# Patient Record
Sex: Female | Born: 1940 | Race: White | Hispanic: No | Marital: Married | Smoking: Never smoker
Health system: Southern US, Community
[De-identification: ages and names within clinical notes are randomized; demographics above are authoritative.]

## PROBLEM LIST (undated history)

## (undated) DIAGNOSIS — Z923 Personal history of irradiation: Secondary | ICD-10-CM

## (undated) DIAGNOSIS — J700 Acute pulmonary manifestations due to radiation: Secondary | ICD-10-CM

## (undated) DIAGNOSIS — R05 Cough: Secondary | ICD-10-CM

## (undated) DIAGNOSIS — J189 Pneumonia, unspecified organism: Secondary | ICD-10-CM

## (undated) DIAGNOSIS — R112 Nausea with vomiting, unspecified: Secondary | ICD-10-CM

## (undated) DIAGNOSIS — E785 Hyperlipidemia, unspecified: Secondary | ICD-10-CM

## (undated) DIAGNOSIS — Z972 Presence of dental prosthetic device (complete) (partial): Secondary | ICD-10-CM

## (undated) DIAGNOSIS — K219 Gastro-esophageal reflux disease without esophagitis: Secondary | ICD-10-CM

## (undated) DIAGNOSIS — E119 Type 2 diabetes mellitus without complications: Secondary | ICD-10-CM

## (undated) DIAGNOSIS — Z9889 Other specified postprocedural states: Secondary | ICD-10-CM

## (undated) DIAGNOSIS — C50919 Malignant neoplasm of unspecified site of unspecified female breast: Secondary | ICD-10-CM

## (undated) DIAGNOSIS — I10 Essential (primary) hypertension: Secondary | ICD-10-CM

## (undated) DIAGNOSIS — M069 Rheumatoid arthritis, unspecified: Secondary | ICD-10-CM

## (undated) DIAGNOSIS — R059 Cough, unspecified: Secondary | ICD-10-CM

## (undated) HISTORY — DX: Essential (primary) hypertension: I10

## (undated) HISTORY — PX: OTHER SURGICAL HISTORY: SHX169

## (undated) HISTORY — DX: Malignant neoplasm of unspecified site of unspecified female breast: C50.919

## (undated) HISTORY — DX: Rheumatoid arthritis, unspecified: M06.9

## (undated) HISTORY — PX: ROTATOR CUFF REPAIR: SHX139

## (undated) HISTORY — DX: Hyperlipidemia, unspecified: E78.5

## (undated) HISTORY — PX: REPLACEMENT TOTAL KNEE BILATERAL: SUR1225

## (undated) HISTORY — PX: JOINT REPLACEMENT: SHX530

## (undated) HISTORY — PX: BREAST LUMPECTOMY: SHX2

## (undated) HISTORY — DX: Type 2 diabetes mellitus without complications: E11.9

## (undated) HISTORY — PX: APPENDECTOMY: SHX54

## (undated) HISTORY — PX: CARPAL TUNNEL RELEASE: SHX101

---

## 1989-03-19 HISTORY — PX: ABDOMINAL HYSTERECTOMY: SHX81

## 2004-08-10 ENCOUNTER — Ambulatory Visit: Payer: Self-pay | Admitting: Internal Medicine

## 2004-09-28 ENCOUNTER — Ambulatory Visit: Payer: Self-pay | Admitting: Internal Medicine

## 2004-10-04 ENCOUNTER — Ambulatory Visit: Payer: Self-pay | Admitting: Internal Medicine

## 2005-05-23 ENCOUNTER — Ambulatory Visit: Payer: Self-pay | Admitting: Specialist

## 2005-06-11 ENCOUNTER — Ambulatory Visit (HOSPITAL_COMMUNITY): Admission: AD | Admit: 2005-06-11 | Discharge: 2005-06-12 | Payer: Self-pay | Admitting: Neurosurgery

## 2005-07-19 ENCOUNTER — Encounter: Admission: RE | Admit: 2005-07-19 | Discharge: 2005-07-19 | Payer: Self-pay | Admitting: Neurosurgery

## 2005-09-16 ENCOUNTER — Emergency Department: Payer: Self-pay | Admitting: General Practice

## 2005-09-27 ENCOUNTER — Ambulatory Visit: Payer: Self-pay | Admitting: Neurosurgery

## 2005-11-21 ENCOUNTER — Other Ambulatory Visit: Payer: Self-pay

## 2005-11-28 ENCOUNTER — Ambulatory Visit: Payer: Self-pay | Admitting: Specialist

## 2006-03-21 ENCOUNTER — Inpatient Hospital Stay: Payer: Self-pay | Admitting: Specialist

## 2007-02-18 ENCOUNTER — Ambulatory Visit: Payer: Self-pay | Admitting: Internal Medicine

## 2008-02-07 IMAGING — MR MRI CERVICAL SPINE WITHOUT CONTRAST
4 of 5 series · 25 of 48 positions shown · non-contrast
Comparison: none

REASON FOR EXAM: Neck pain
COMMENTS:

PROCEDURE:     MR  - MR CERVICAL SPINE WO CONT  - May 23, 2005  [DATE]
RESULT:
HISTORY: Neck pain.

[Series 4: T1 · sagittal · 3.0mm · 0.51mm/px · 3 of 16 slices shown]
[im 3/16]
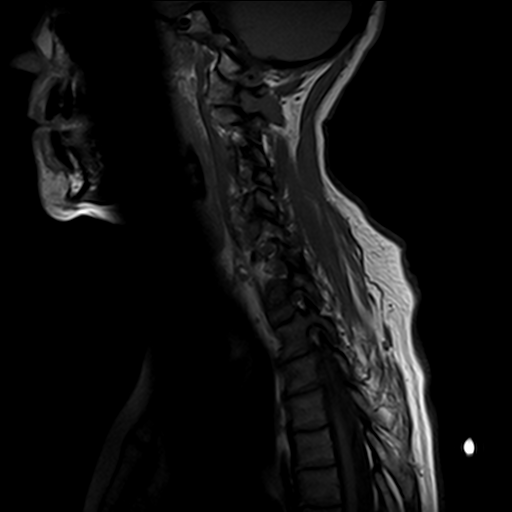
[im 9/16]
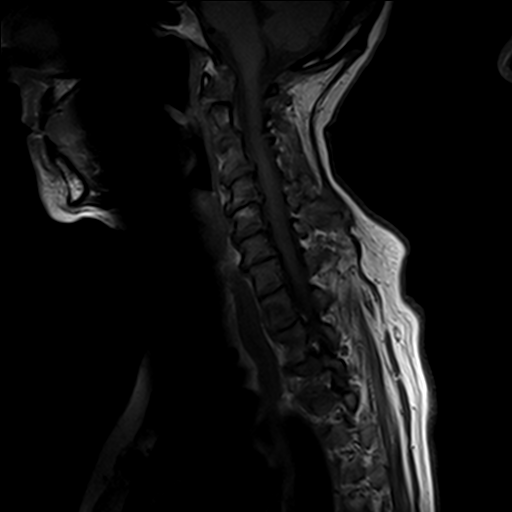
[im 13/16]
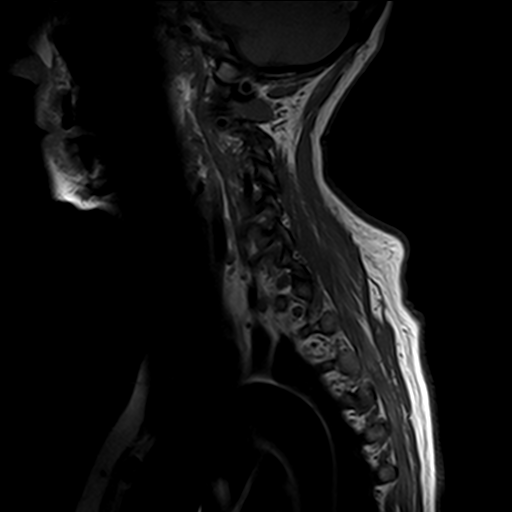

[Series 5002: T2 · axial · 3.0mm · 0.35mm/px · z∈[-77,+106]mm · 9 of 26 slices shown (1 of 3)]
[im 1/26]
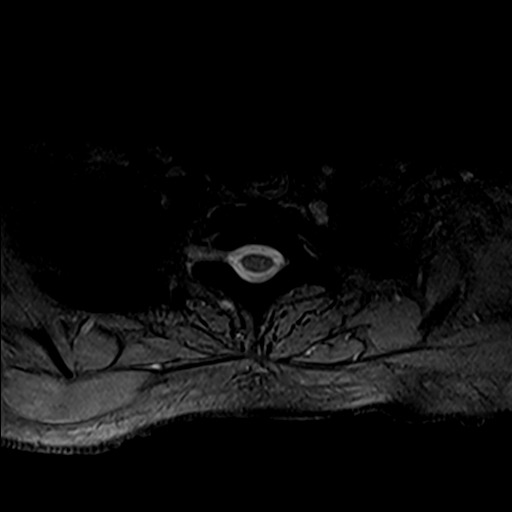
[im 5/26]
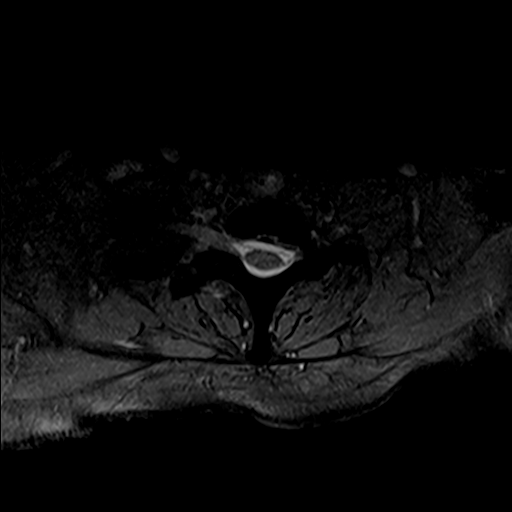
[im 7/26]
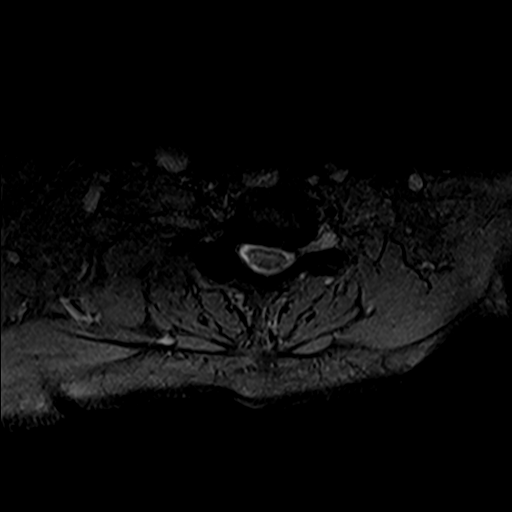
[im 12/26]
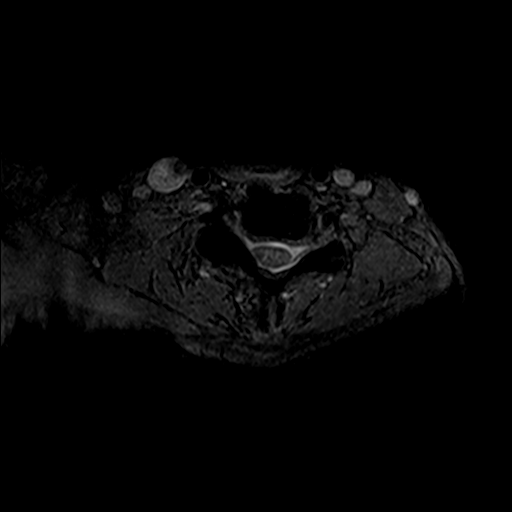
[im 14/26]
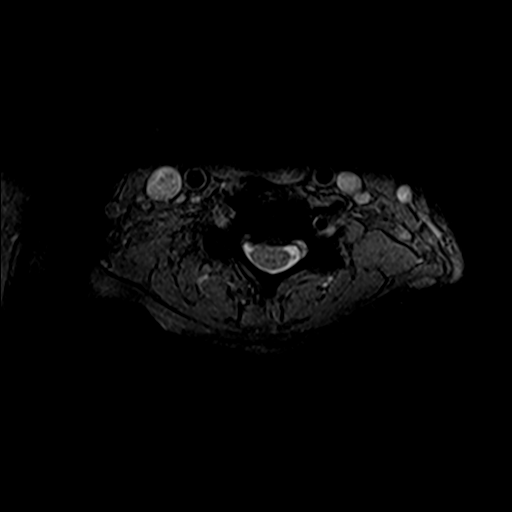
[im 19/26]
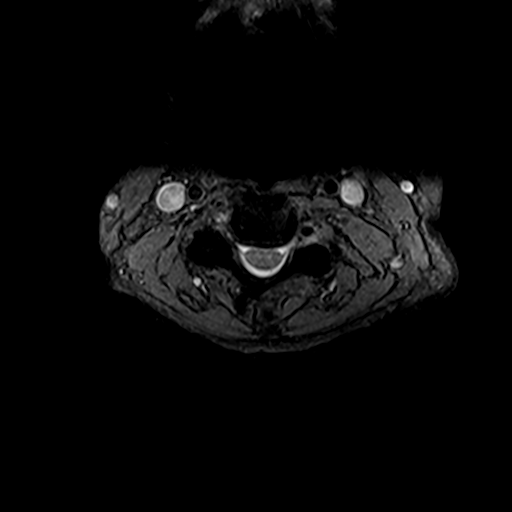
[im 21/26]
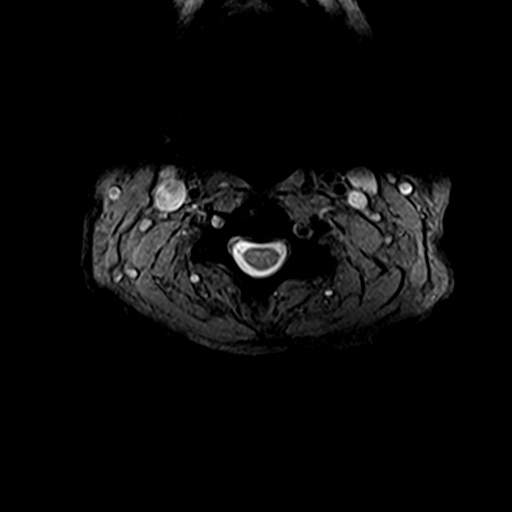
[im 23/26]
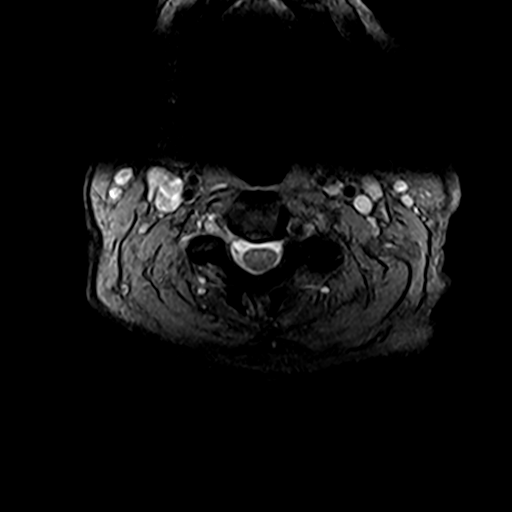
[im 26/26]
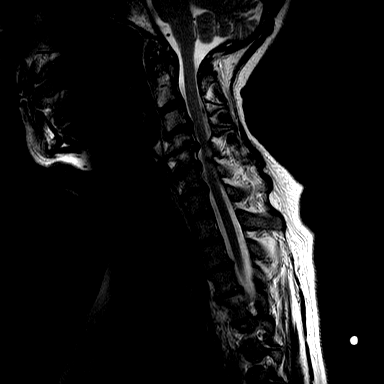

[Series 5006: T2 · sagittal · 3.0mm · 0.68mm/px · 8 of 16 slices shown (2 of 3)]
[im 1/16]
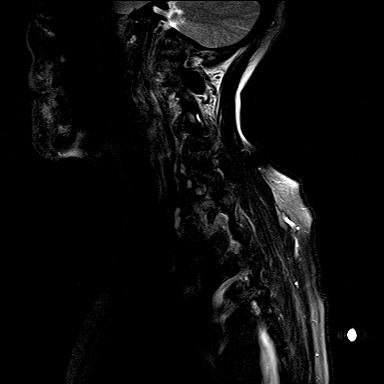
[im 3/16]
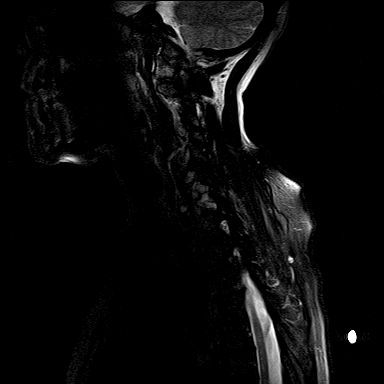
[im 5/16]
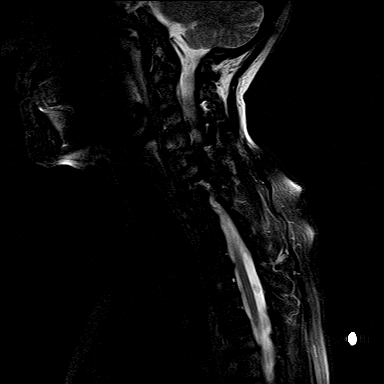
[im 7/16]
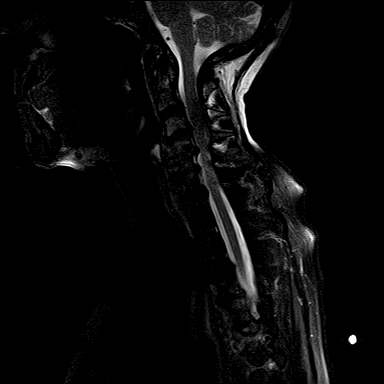
[im 9/16]
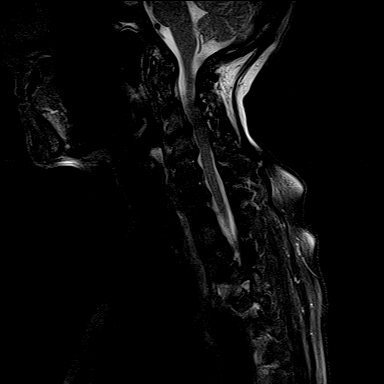
[im 11/16]
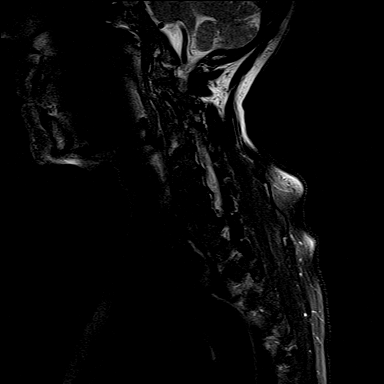
[im 13/16]
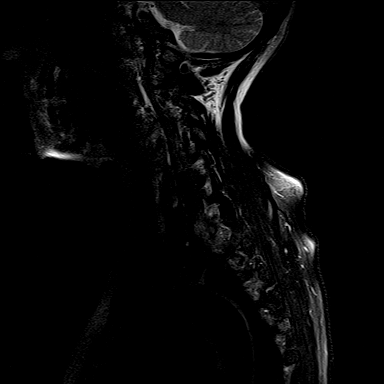
[im 16/16]
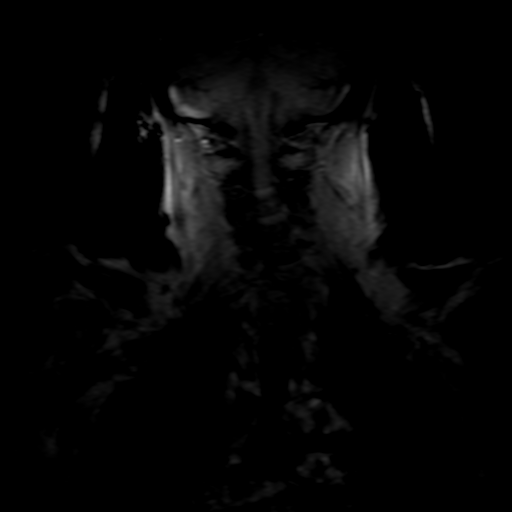

[Series 5008: T2 · sagittal · 3.0mm · 0.68mm/px · 5 of 16 slices shown (3 of 3)]
[im 1/16]
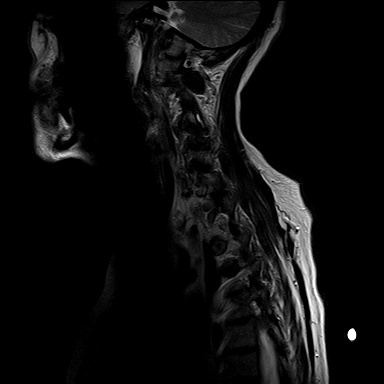
[im 3/16]
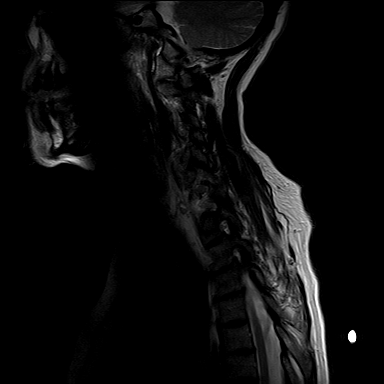
[im 5/16]
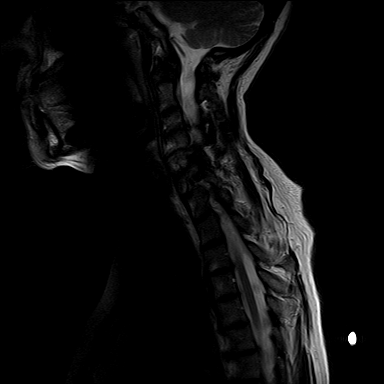
[im 9/16]
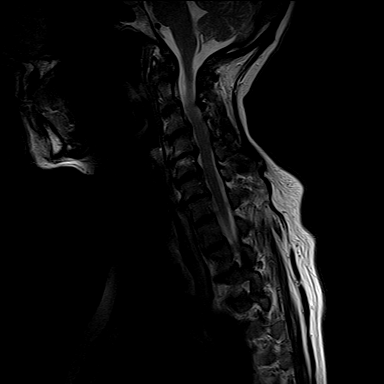
[im 13/16]
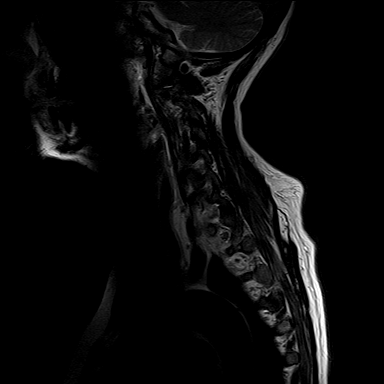

[25 of 48 positions shown; findings below may reference images not displayed]

COMPARISON STUDIES:   Cervical spine series from [REDACTED]
dated 04/10/05.

PROCEDURE AND FINDINGS:    Multiplanar/multisequence imaging of the cervical
spine was obtained.   There is a tiny covered central disc protrusion at
C3-C4 with associated degenerative endplate osteophyte formation.  No
cervical cord impingement noted at this level.  At C4-C5 there is a RIGHT
paracentral disc protrusion and degenerative endplate osteophyte formation
with narrowing of the RIGHT neural foramen.  The narrowing is  moderate. At
C5-C6 there is degenerative endplate osteophyte formation with annular
bulge.   No cervical cord impingement noted.  There is narrowing of the
thecal sac from C3 through C5-C6 secondary to the above described
degenerative change.  Intrinsically the cervical cord is normal.  The
craniovertebral junction is normal.  No paraspinal abnormalities are
identified.
IMPRESSION: Moderate degenerative disease at C3-C4, C4-C5 and C5-C6 as described above.
Narrowing of the thecal sac without cervical cord compression noted.  There
is narrowing of the RIGHT C4-C5 neural foramen secondary to degenerative
endplate osteophyte formation and facetal hypertrophy.

## 2008-02-26 IMAGING — RF DG CERVICAL SPINE 1V
1 series · 1 of 1 positions shown · non-contrast
Comparison: none

CLINICAL DATA: C4-5 ACDF allograft and plating. 
 CERVICAL SPINE ? 1 VIEW:

[Series 1: run · 1 of 1 slices shown]
[im 1/1]
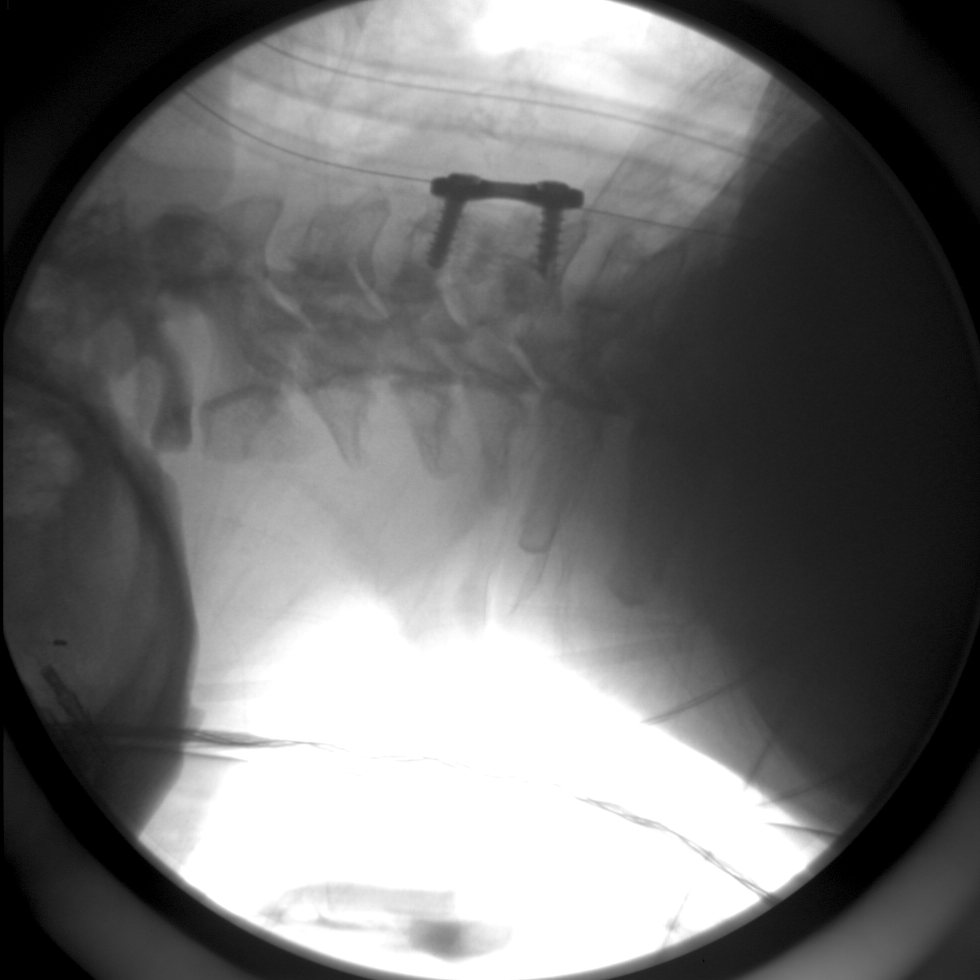

[1 of 1 positions shown; findings below may reference images not displayed]

FINDINGS: Single intraoperative crosstable lateral view of the cervical spine is submitted.  Cervical spine is visualized from C1 to C6.  There is anterior plate and screws at C4-5.  Osseous detail is degraded by technique.
IMPRESSION: C4-5 anterior cervical fusion.

## 2008-03-17 ENCOUNTER — Ambulatory Visit: Payer: Self-pay | Admitting: Internal Medicine

## 2008-04-04 IMAGING — CR DG CERVICAL SPINE 1V
1 series · 1 of 1 positions shown · non-contrast
Comparison: 06/11/05

CLINICAL DATA: 64 year old with cervical fusion.
 LATERAL CERVICAL SPINE:

[view not recorded]
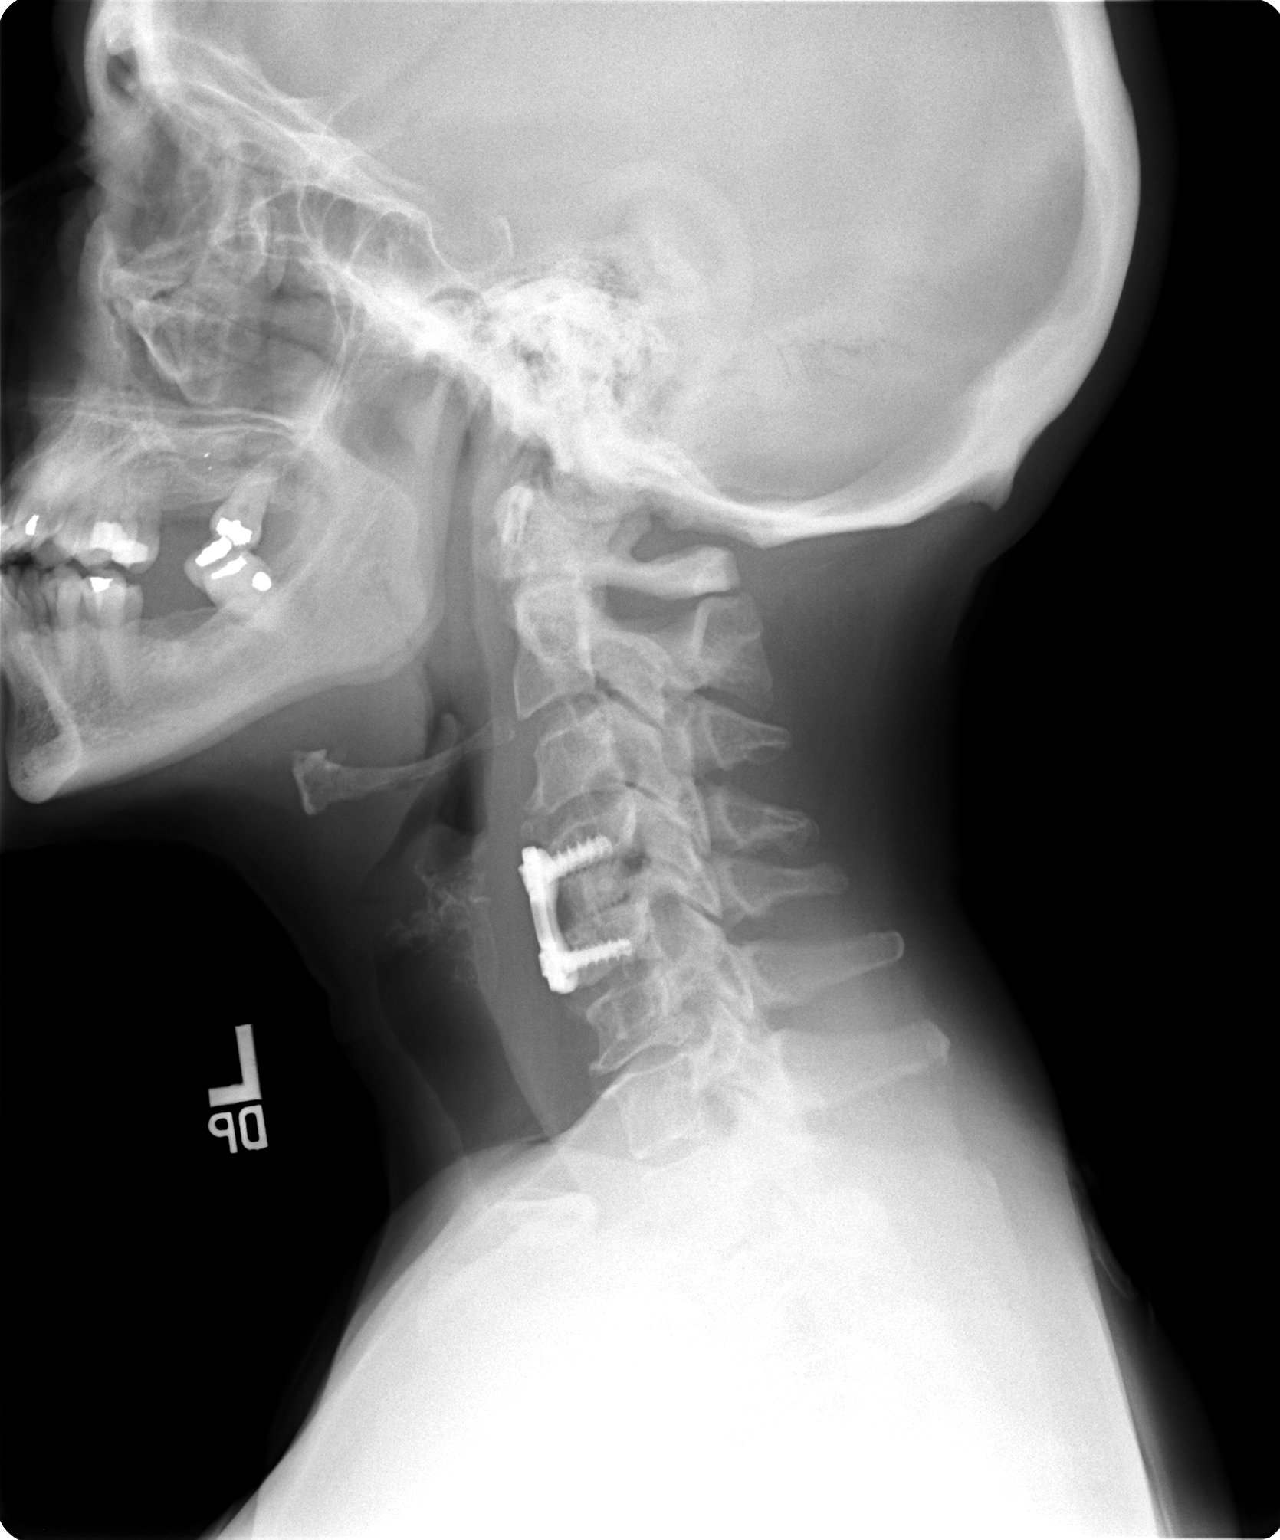

[1 of 1 positions shown; findings below may reference images not displayed]

FINDINGS: Cervical fusion hardware fusing C4 ? C5 with an interbody bone plug.  Normal alignment.  No complicating features are demonstrated.  Stable degenerative change in the remainder of the cervical spine.
IMPRESSION: 1.  Stable fusion hardware at C4-5.  Normal alignment and no complicating features.
 2.  Stable degenerative changes in the remainder of the cervical spine.

## 2008-06-02 IMAGING — US US EXTREM LOW VENOUS*R*
1 series · 17 of 24 positions shown · non-contrast
Comparison: none

REASON FOR EXAM: Pain, red
COMMENTS:

[Series 1: us extrem low venous*right* · 17 of 35 slices shown]
[im 1/35]
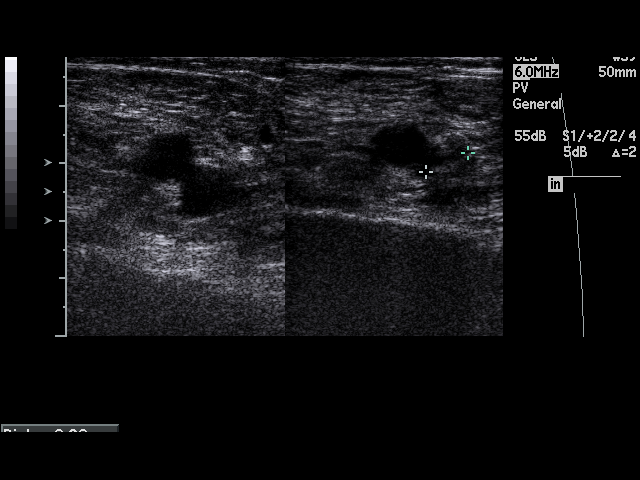
[im 3/35]
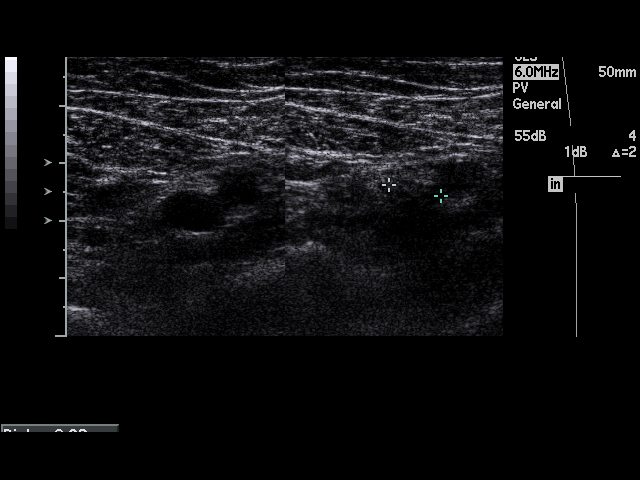
[im 5/35]
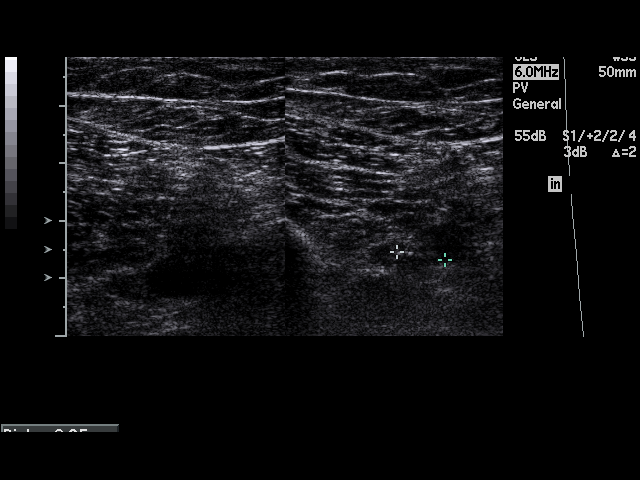
[im 6/35]
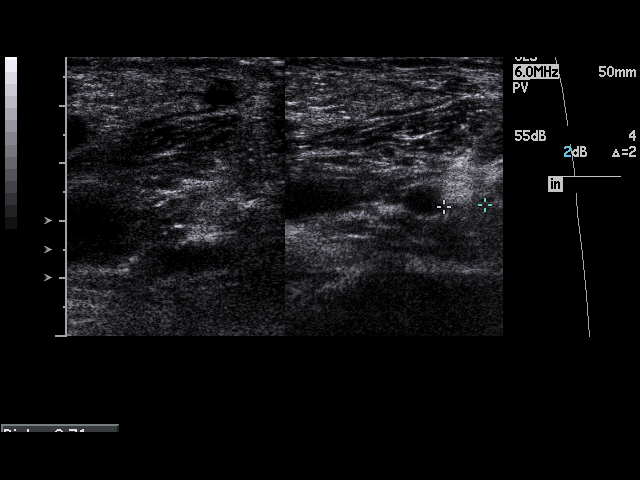
[im 9/35]
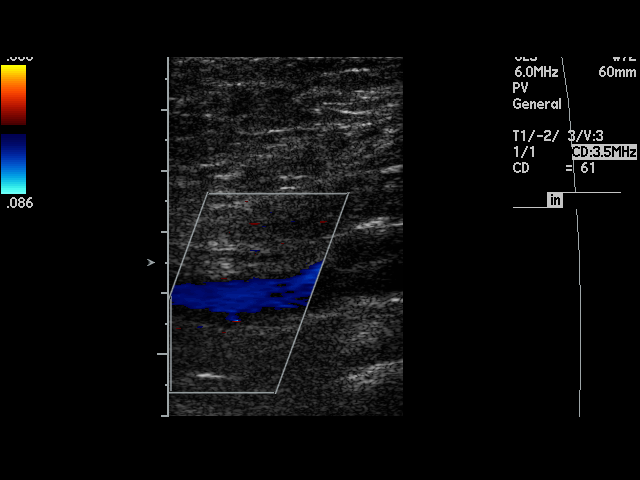
[im 11/35]
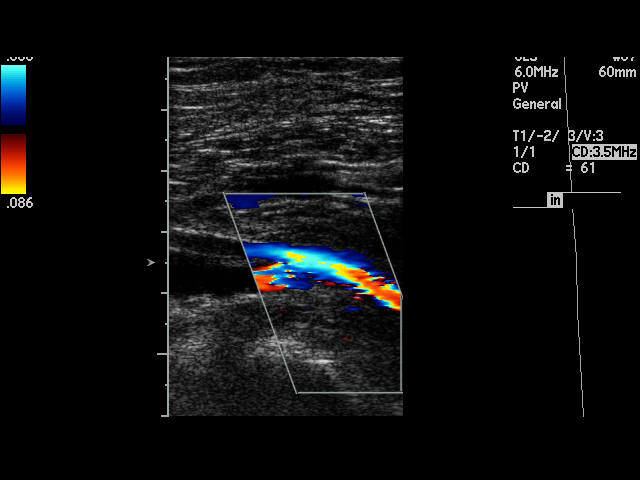
[im 14/35]
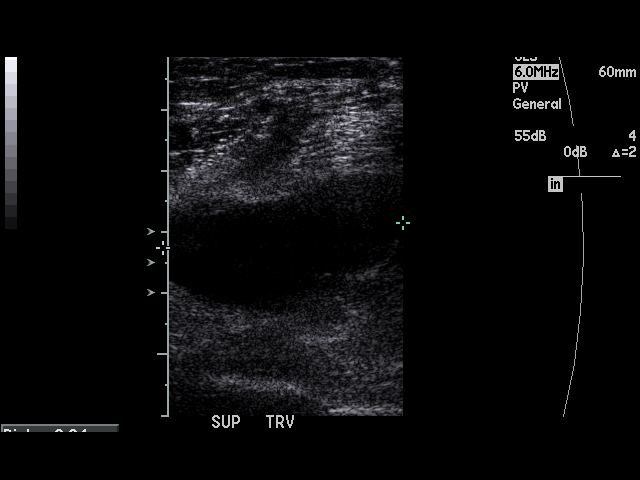
[im 15/35]
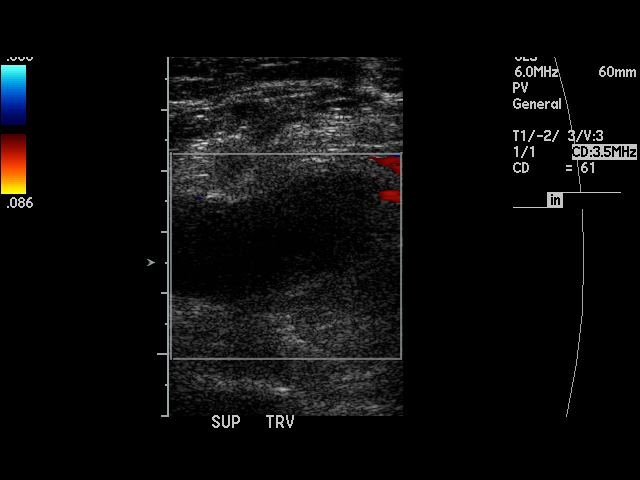
[im 18/35]
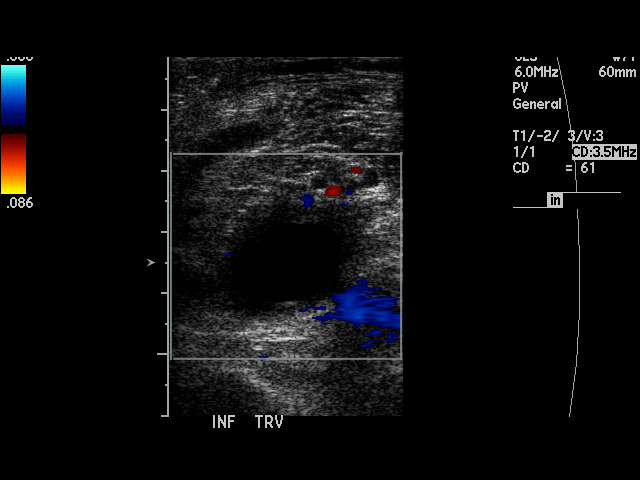
[im 20/35]
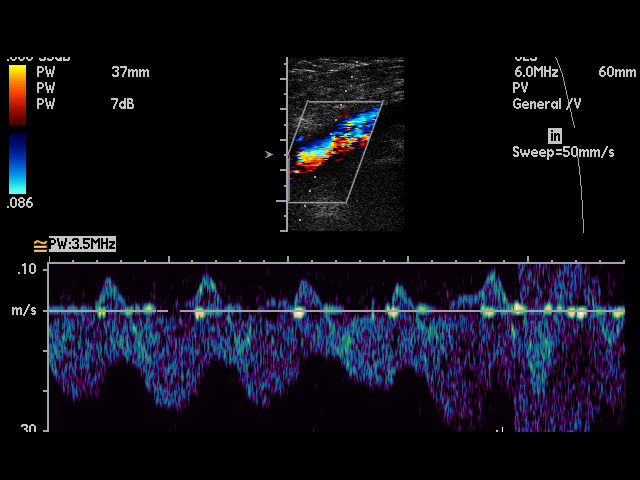
[im 21/35]
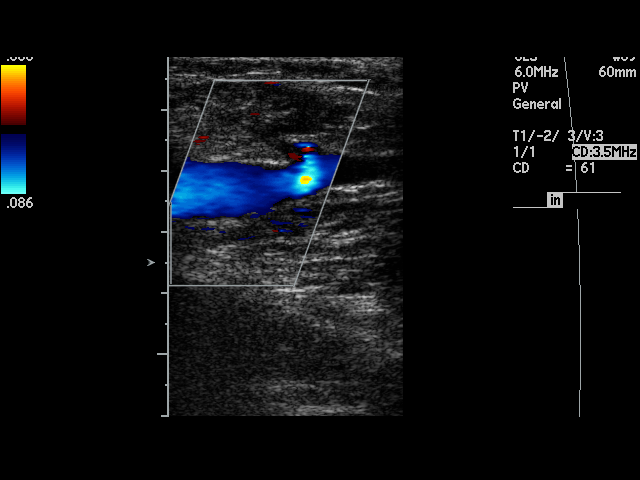
[im 24/35]
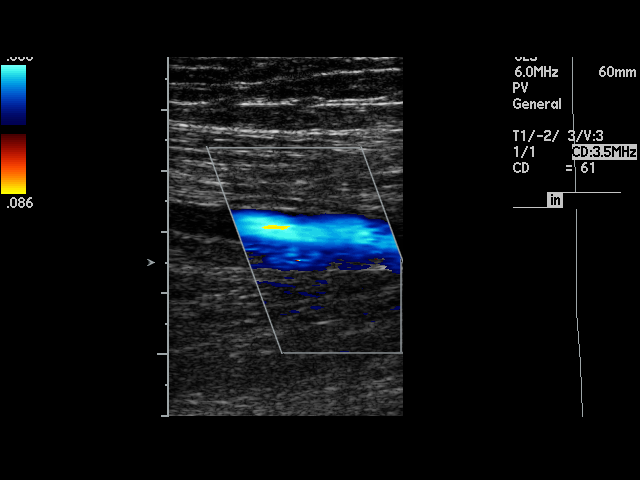
[im 26/35]
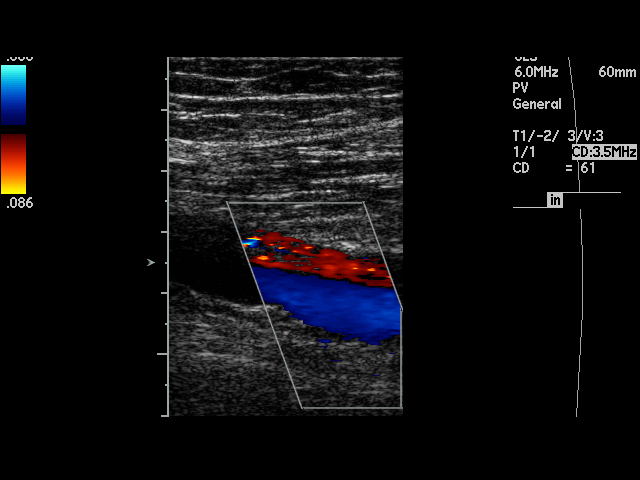
[im 29/35]
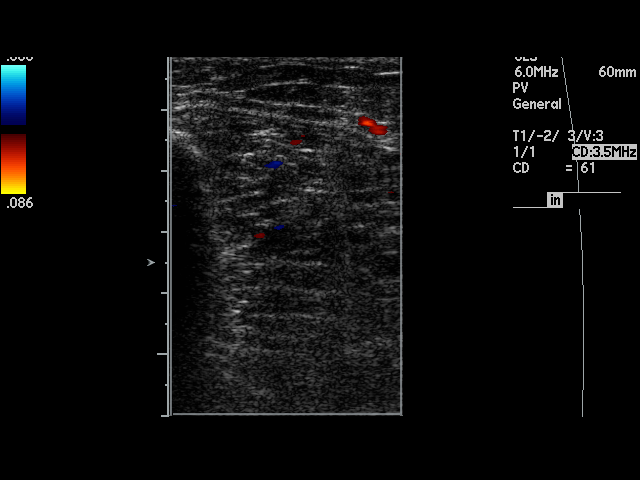
[im 30/35]
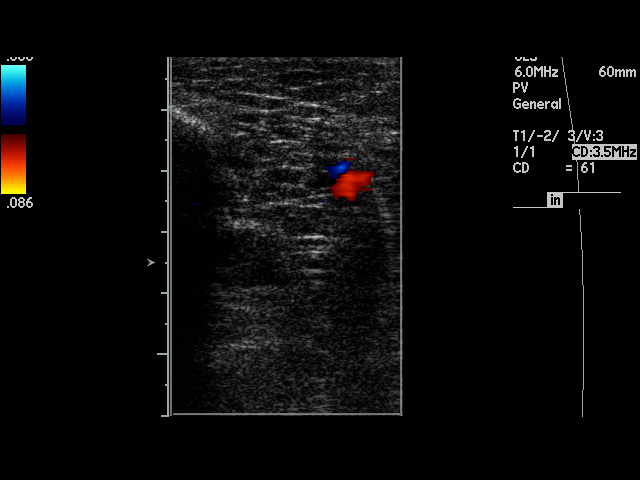
[im 32/35]
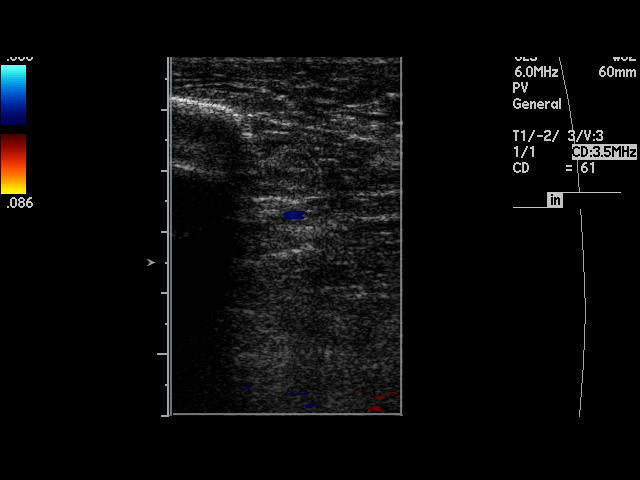
[im 35/35]
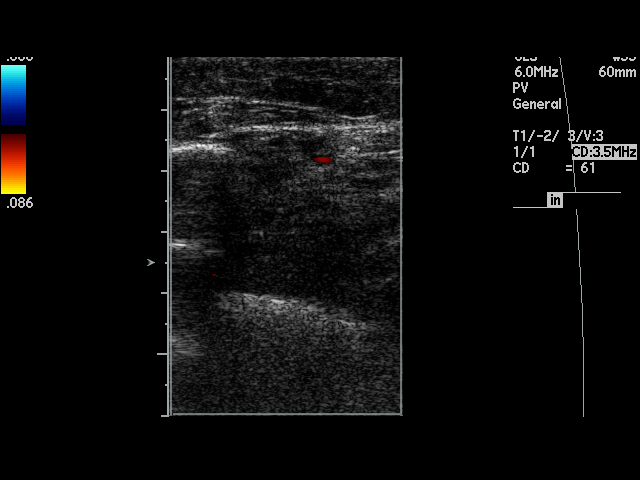

[17 of 24 positions shown; findings below may reference images not displayed]

PROCEDURE:     US  - US DOPPLER LOW EXTR RIGHT  - September 16, 2005  [DATE]

RESULT:     Duplex Doppler interrogation of the deep venous system of the
RIGHT leg, from the inguinal to the popliteal region, demonstrates two,
popliteal fossa cysts with each measuring up to approximately 6.0 cm in
length x more than 2.0 cm in width.

The deep venous structures appear to be fully patent with normal
compressibility and normal response to distal augmentation and to Valsalva.
IMPRESSION: 1.     Two, large popliteal fossa cysts.
2.     No evidence of DVT.

## 2008-06-13 IMAGING — MR MRI OF THE RIGHT KNEE WITHOUT CONTRAST
5 series · 40 of 40 positions shown · non-contrast
Comparison: none

REASON FOR EXAM: RIGHT knee pain questionable cyst ruptured
COMMENTS:

PROCEDURE:     MR  - MR KNEE RT  WO  CONTRAST  - September 27, 2005  [DATE]
RESULT:
REASON FOR CONSULTATION:  RIGHT knee pain.
TECHNIQUE: Sagittal, axial, and coronal imaging was obtained.

[Series 3: T2 · axial · 4.0mm · 0.62mm/px · z∈[-46,+175]mm · 8 of 24 slices shown]
[im 1/24]
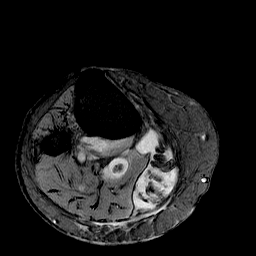
[im 4/24]
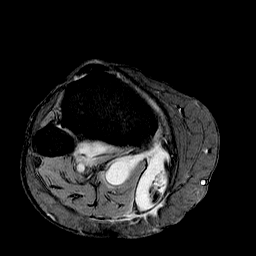
[im 7/24]
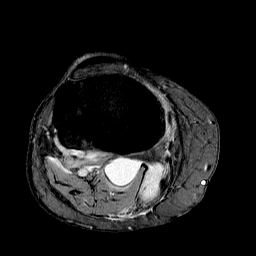
[im 10/24]
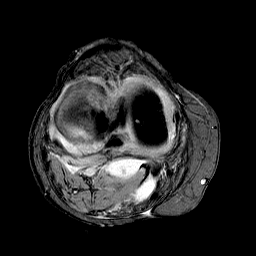
[im 14/24]
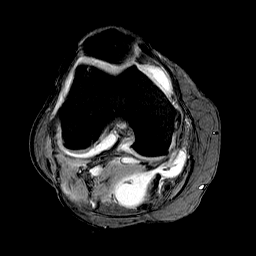
[im 17/24]
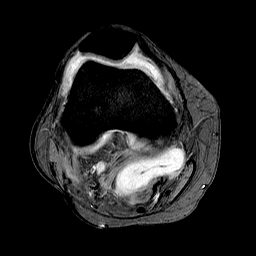
[im 20/24]
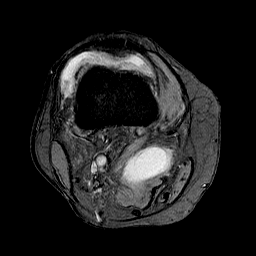
[im 24/24]
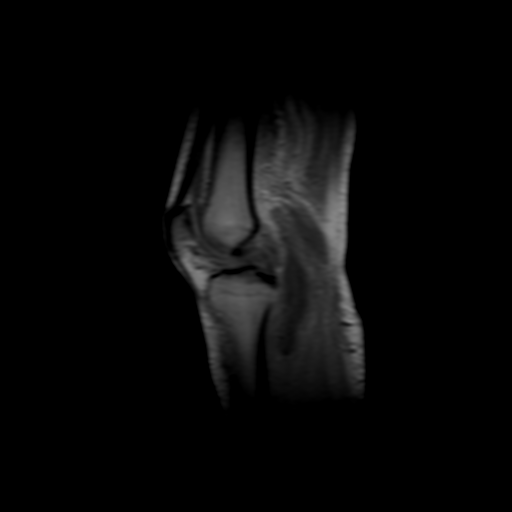

[Series 5: T2 fat-sat · axial · 4.0mm · 0.62mm/px · z∈[+11,+83]mm · 8 of 22 slices shown]
[im 1/22]
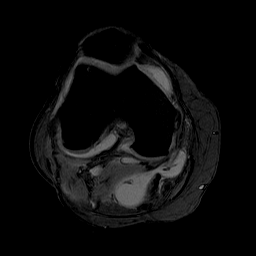
[im 4/22]
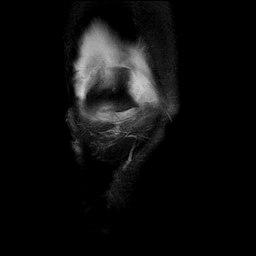
[im 7/22]
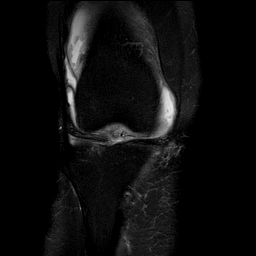
[im 10/22]
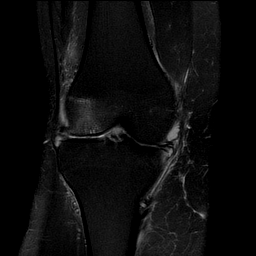
[im 13/22]
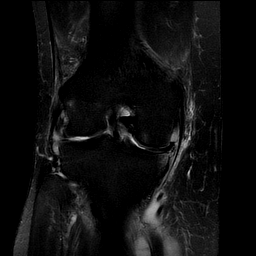
[im 16/22]
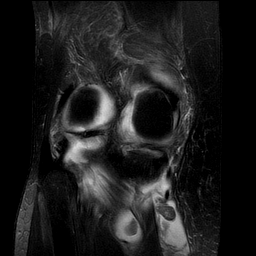
[im 19/22]
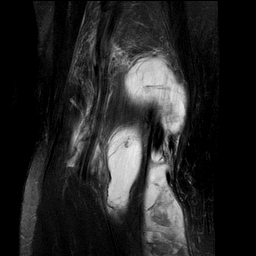
[im 22/22]
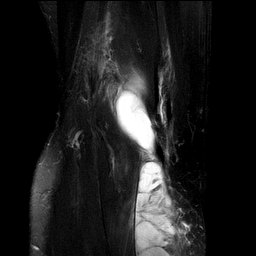

[Series 7: PD · axial · 4.0mm · 0.62mm/px · z∈[+11,+83]mm · 8 of 22 slices shown (1 of 2)]
[im 1/22]
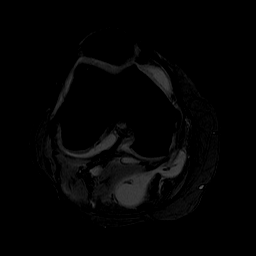
[im 4/22]
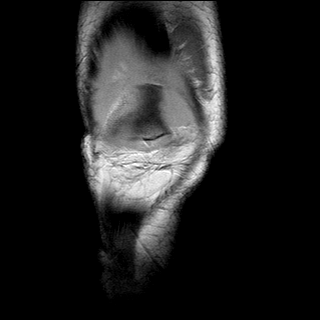
[im 7/22]
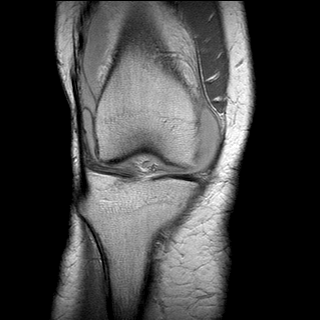
[im 10/22]
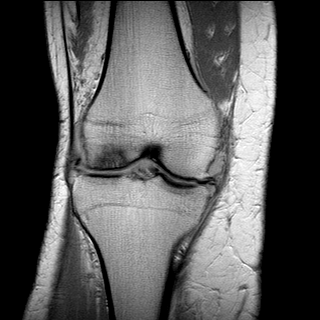
[im 13/22]
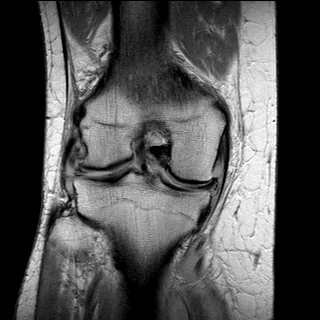
[im 16/22]
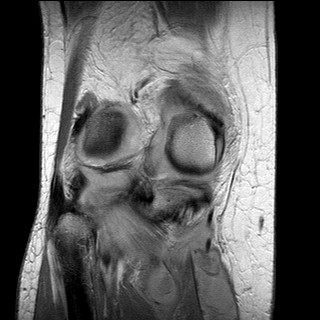
[im 19/22]
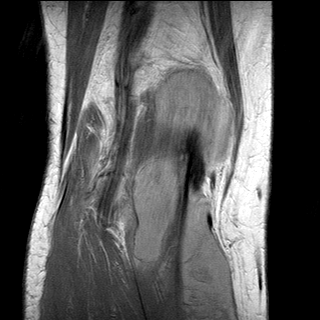
[im 22/22]
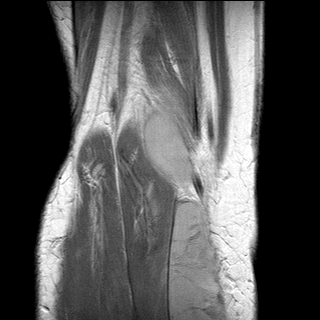

[Series 9: PD · axial · 4.0mm · 0.62mm/px · z∈[+11,+96]mm · 8 of 22 slices shown (2 of 2)]
[im 1/22]
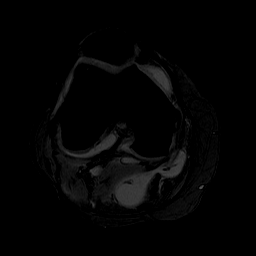
[im 4/22]
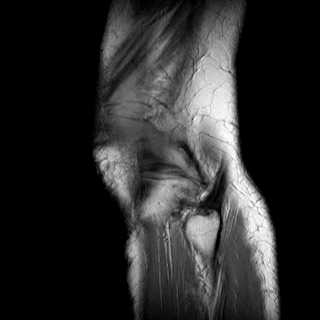
[im 7/22]
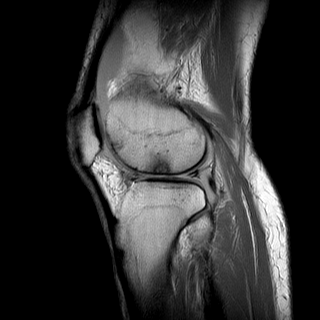
[im 10/22]
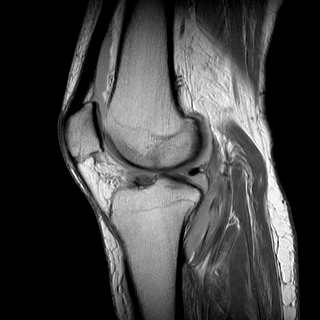
[im 13/22]
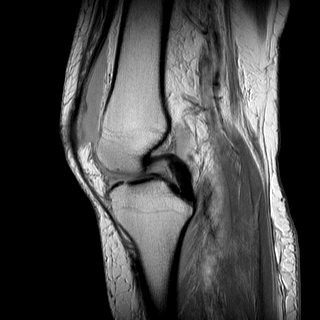
[im 16/22]
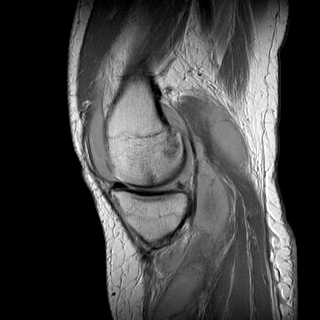
[im 19/22]
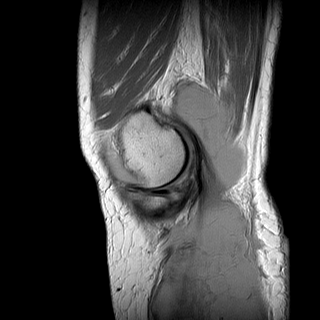
[im 22/22]
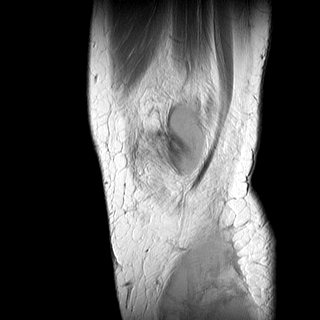

[Series 11: GRE · axial · 4.0mm · 0.62mm/px · z∈[+11,+96]mm · 8 of 22 slices shown]
[im 1/22]
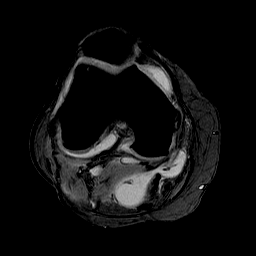
[im 4/22]
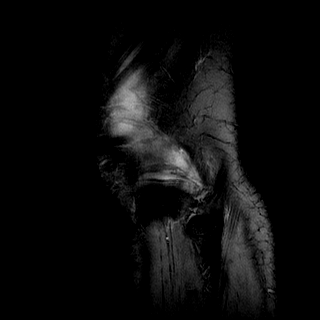
[im 7/22]
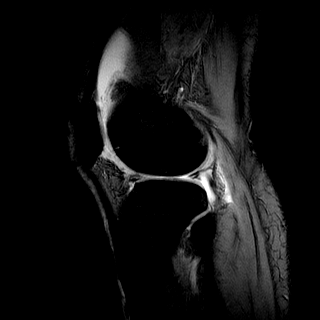
[im 10/22]
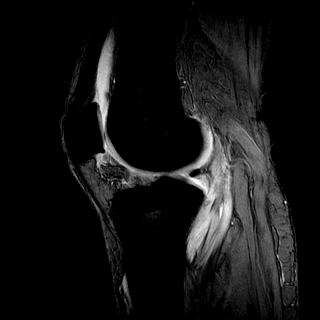
[im 13/22]
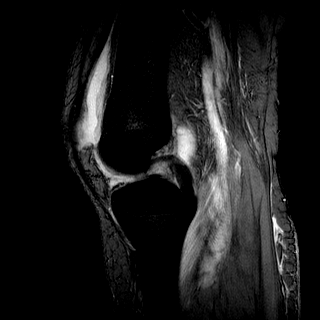
[im 16/22]
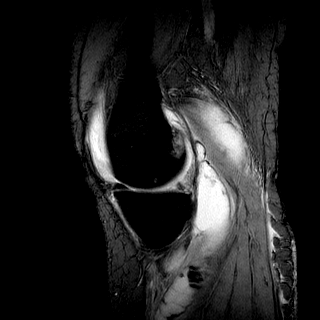
[im 19/22]
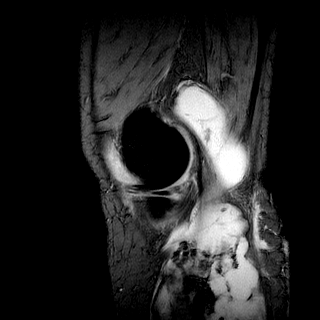
[im 22/22]
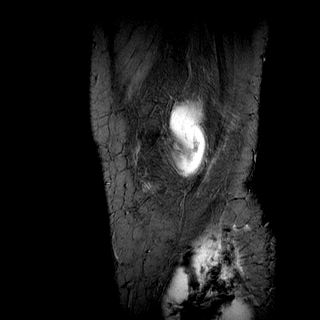

[40 of 40 positions shown; findings below may reference images not displayed]

FINDINGS: There is noted moderate joint effusion present. Retropatellar
cartilage appears intact.

There is noted an undersurface oblique tear of the posterior horn of the
medial and lateral menisci.  The anterior meniscus appears intact.

Both the anterior and posterior cruciate ligaments are intact.

There is noted a large popliteal cyst which does dissect inferiorly.  I do
not see the inferior extent.  There is density within the popliteal cyst
which could represent some blood products.

The medial and lateral collateral ligaments are intact.  There appears some
edema about the medial collateral ligament.  There is also noted some
degenerative changes in the lateral femoral condyle.  There are some
intercondylar changes noted in the medial femoral condyle.  No chondral
defects are noted.
IMPRESSION: Oblique tears of the posterior menisci bilaterally.  Effusion is present as
well as a large popliteal cyst.  The popliteal cyst dissects inferiorly.

## 2008-07-29 ENCOUNTER — Ambulatory Visit: Payer: Self-pay | Admitting: Specialist

## 2008-07-29 ENCOUNTER — Ambulatory Visit: Payer: Self-pay | Admitting: Cardiology

## 2008-08-05 ENCOUNTER — Ambulatory Visit: Payer: Self-pay | Admitting: Specialist

## 2008-11-02 ENCOUNTER — Ambulatory Visit: Payer: Self-pay | Admitting: Specialist

## 2008-11-10 ENCOUNTER — Ambulatory Visit: Payer: Self-pay | Admitting: Specialist

## 2008-12-05 IMAGING — CR DG KNEE 1-2V*L*
1 series · 3 of 3 positions shown · non-contrast
Comparison: none

REASON FOR EXAM: post op
COMMENTS:  Bedside (portable):Y

PROCEDURE:     DXR - DXR KNEE LEFT AP AND LATERAL  - March 21, 2006 [DATE]
RESULT:     AP and lateral view reveal total LEFT knee prosthesis well
seated against the femoral condyle and proximal tibia.  Surgical drains are
in place.

[Series 1: view not recorded · 0.17mm/px · 3 of 3 slices shown]
[im 1/3]
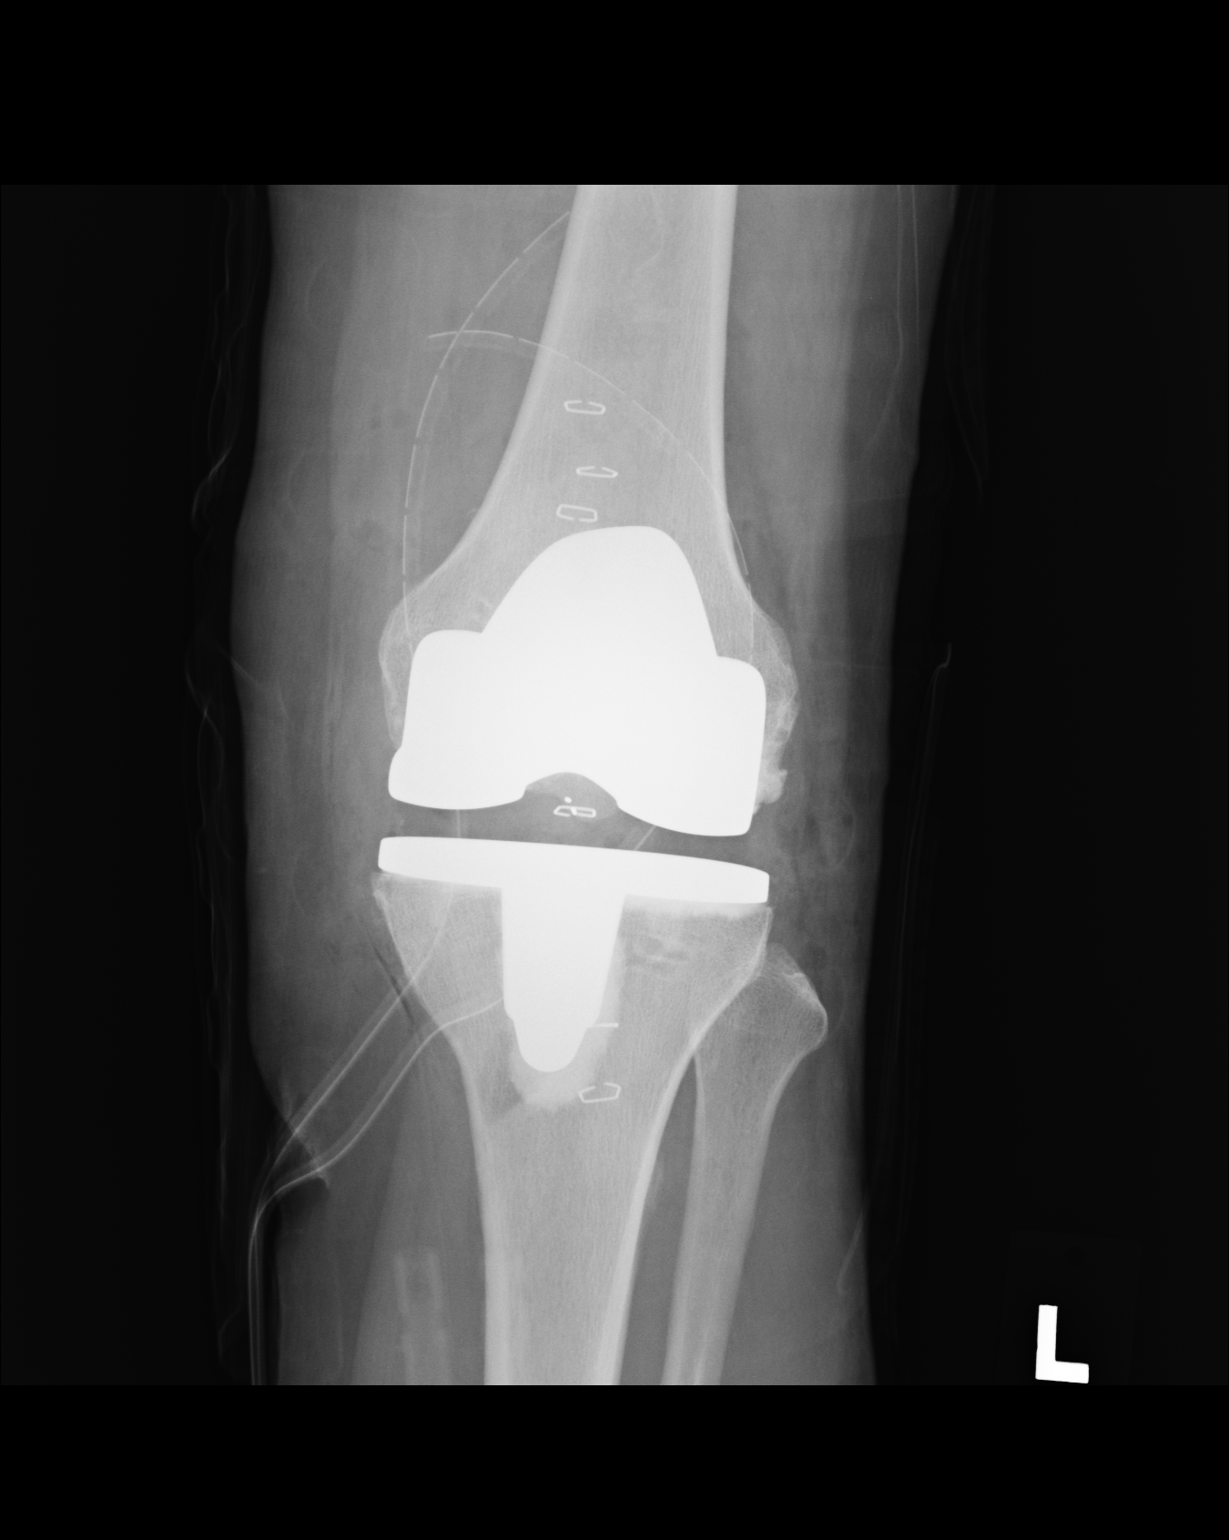
[im 2/3]
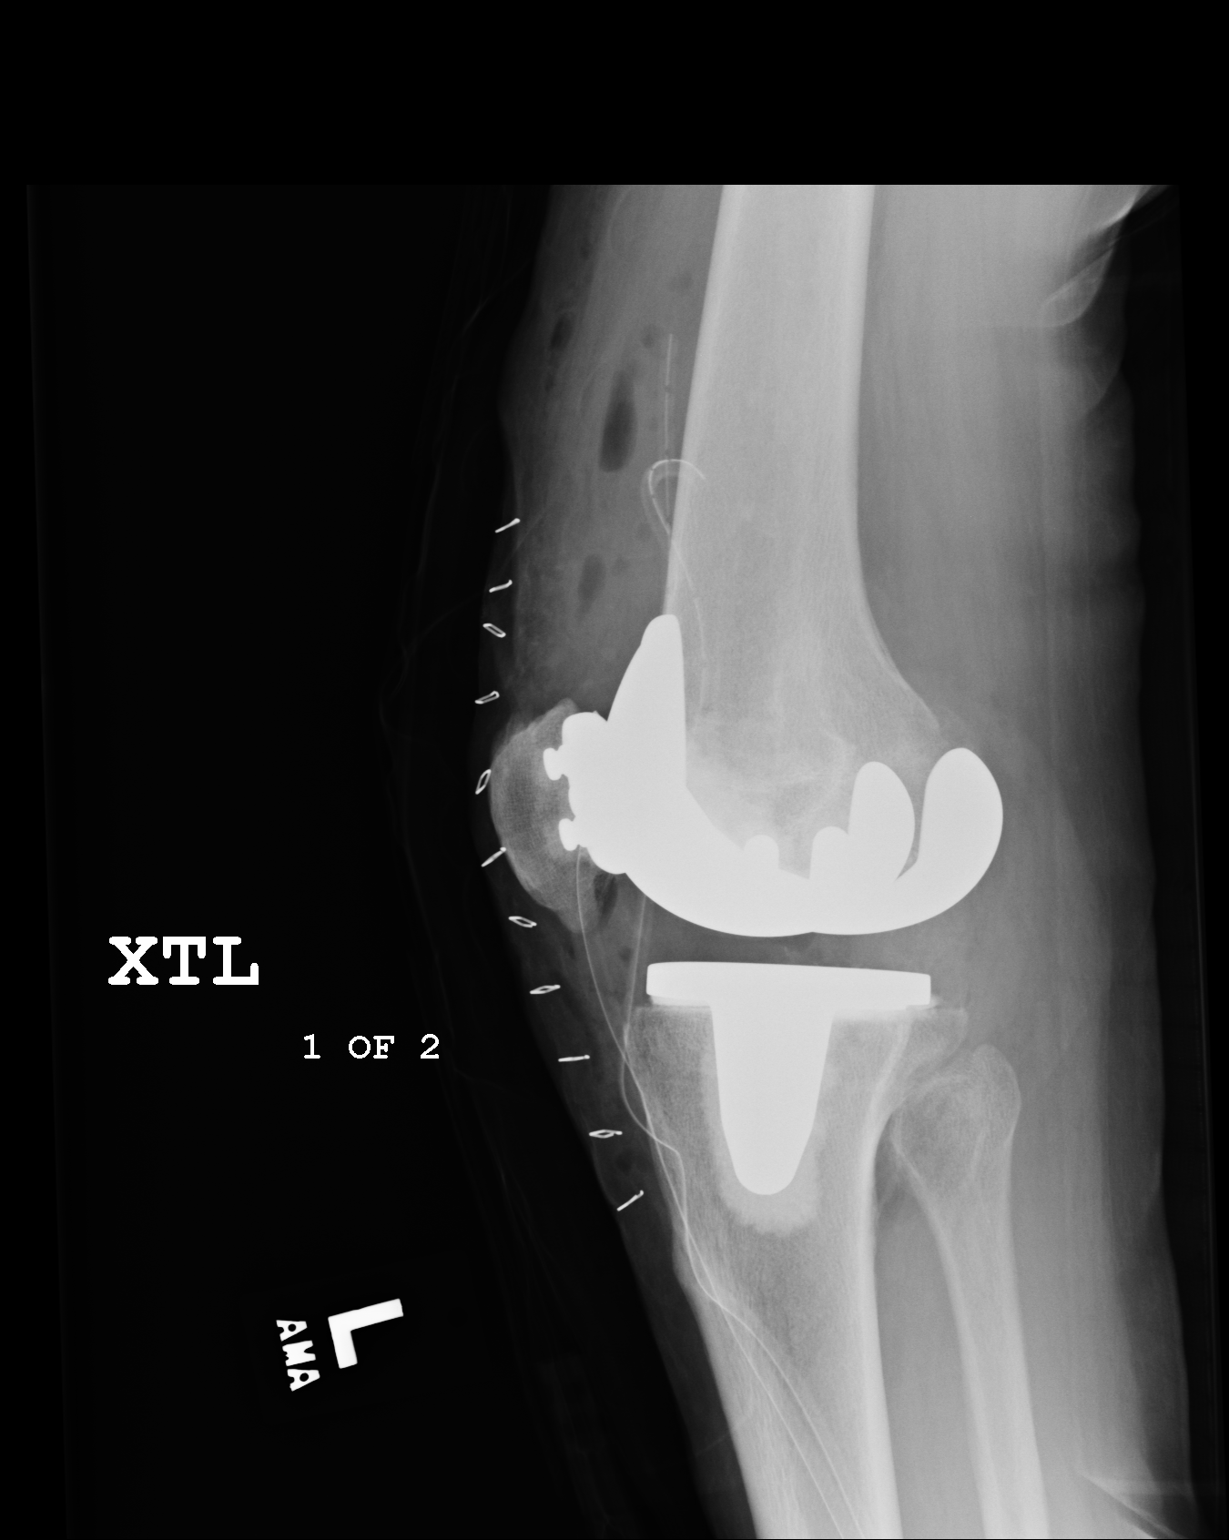
[im 3/3]
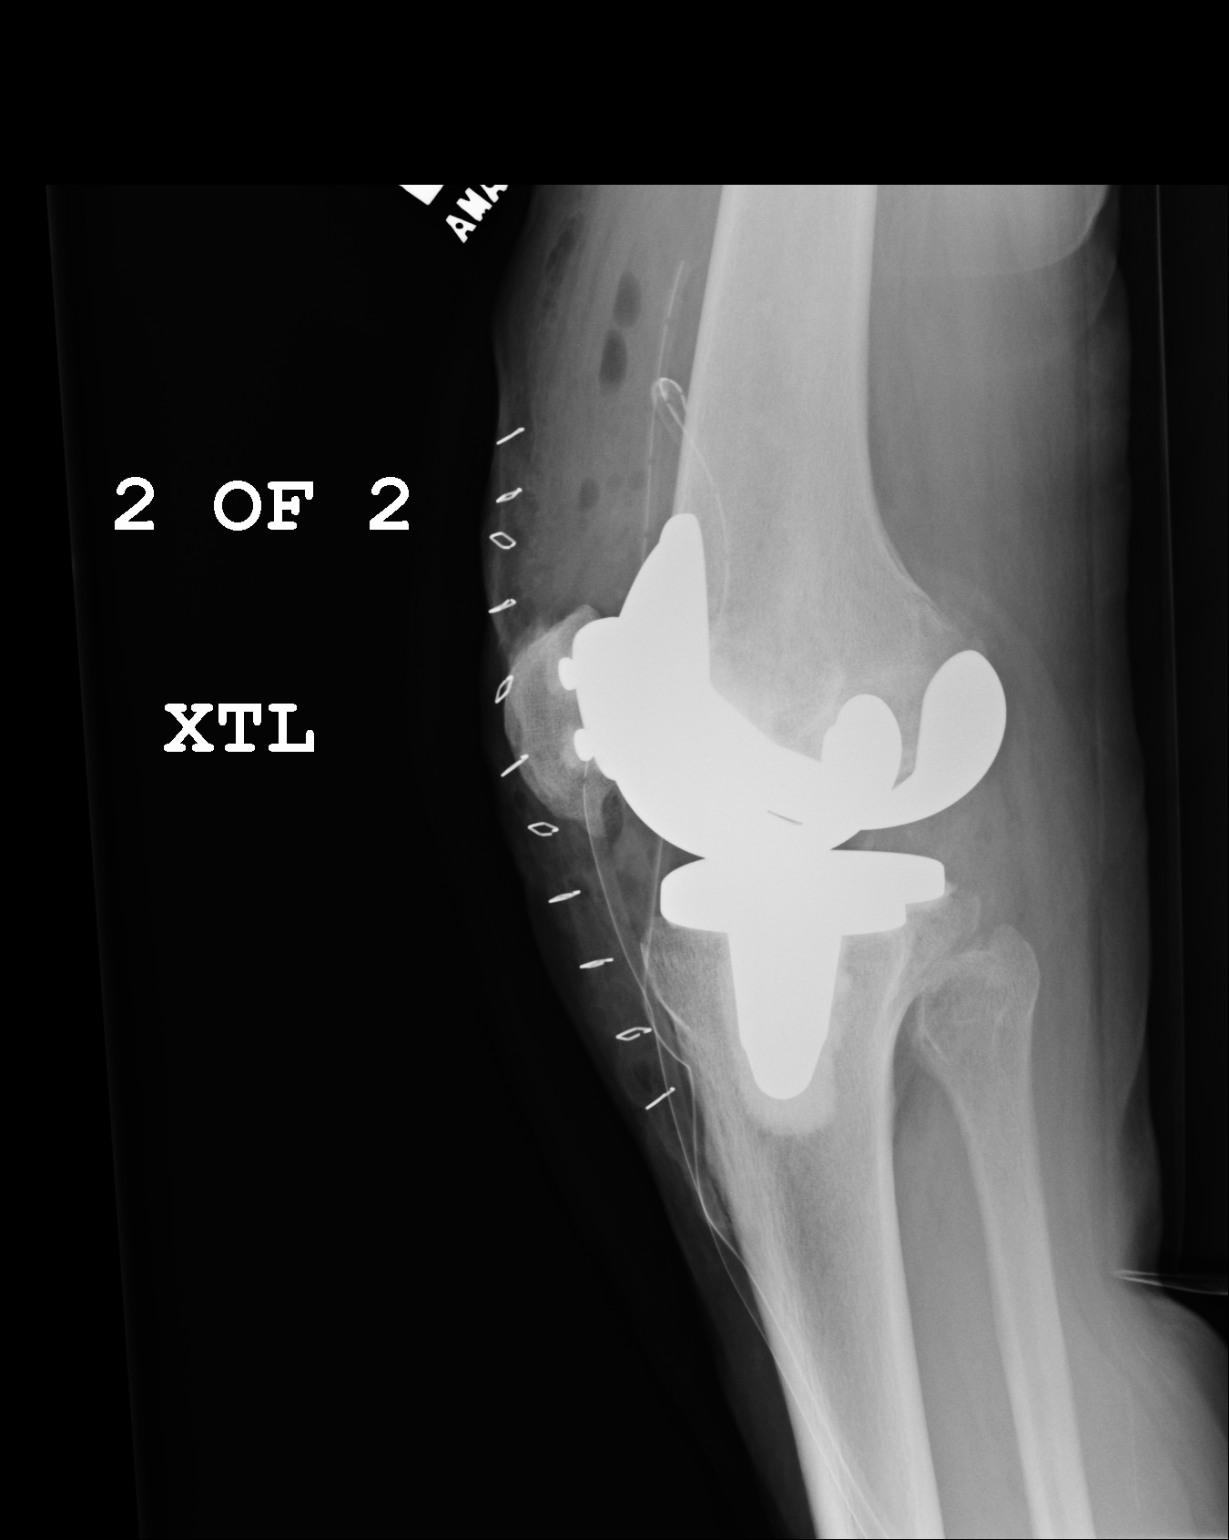

[3 of 3 positions shown; findings below may reference images not displayed]

IMPRESSION: Postoperative total LEFT knee.

## 2009-03-22 ENCOUNTER — Ambulatory Visit: Payer: Self-pay | Admitting: Internal Medicine

## 2009-12-14 ENCOUNTER — Emergency Department: Payer: Self-pay | Admitting: Emergency Medicine

## 2010-04-27 ENCOUNTER — Ambulatory Visit: Payer: Self-pay | Admitting: Internal Medicine

## 2011-06-18 ENCOUNTER — Ambulatory Visit: Payer: Self-pay | Admitting: Internal Medicine

## 2011-12-07 IMAGING — MG MM CAD SCREENING MAMMO
1 series · 4 of 4 positions shown · non-contrast
Comparison: none

REASON FOR EXAM: scr
COMMENTS:

[R CC · right · 4 of 4 slices shown]
[im 1/4]
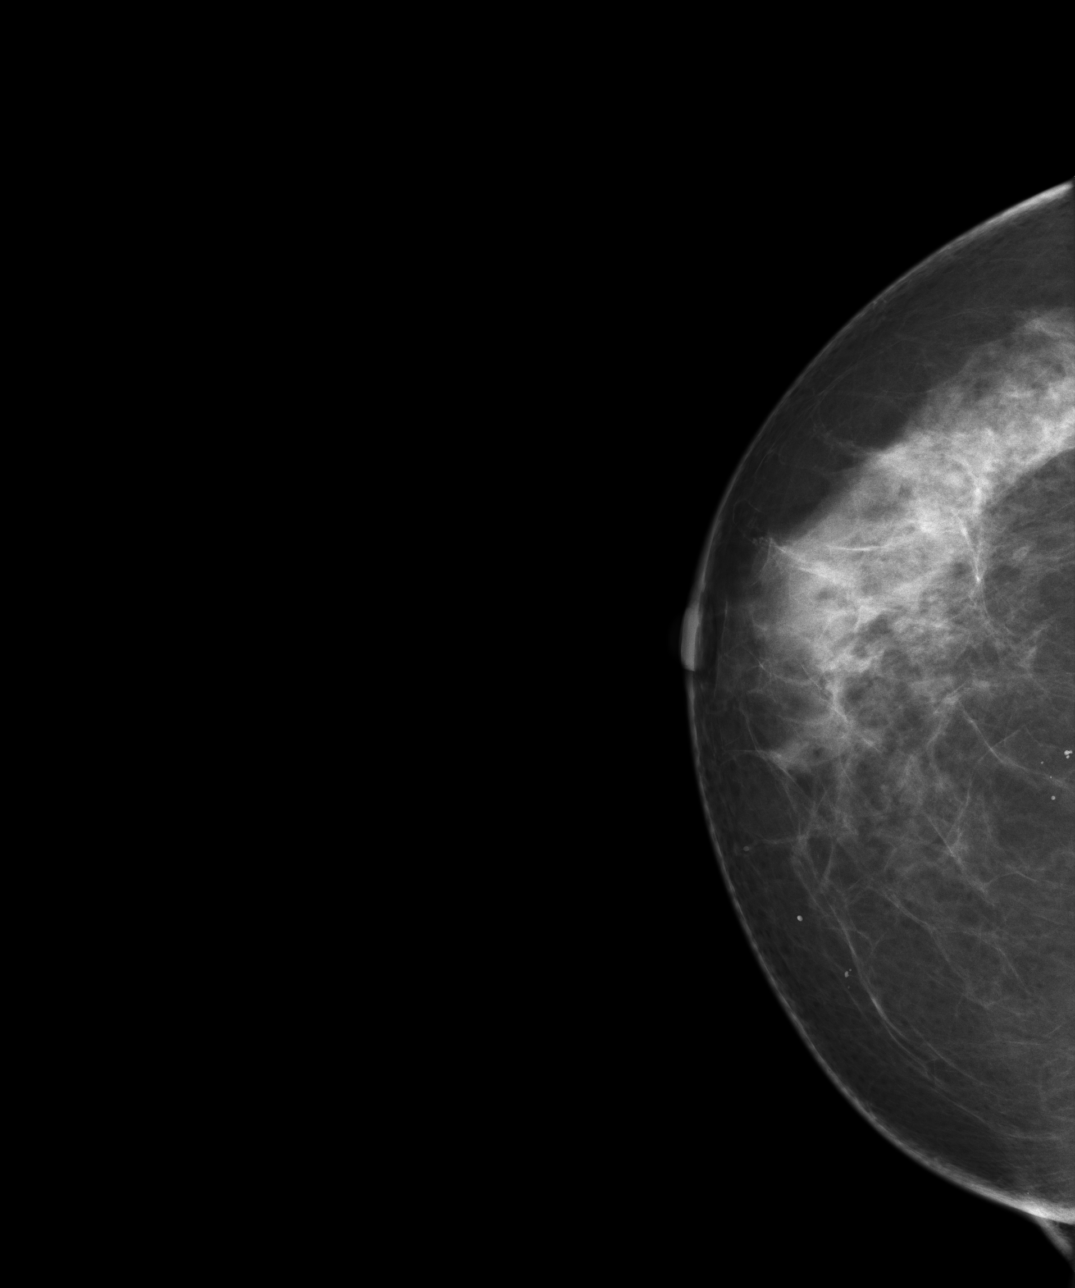
[im 2/4]
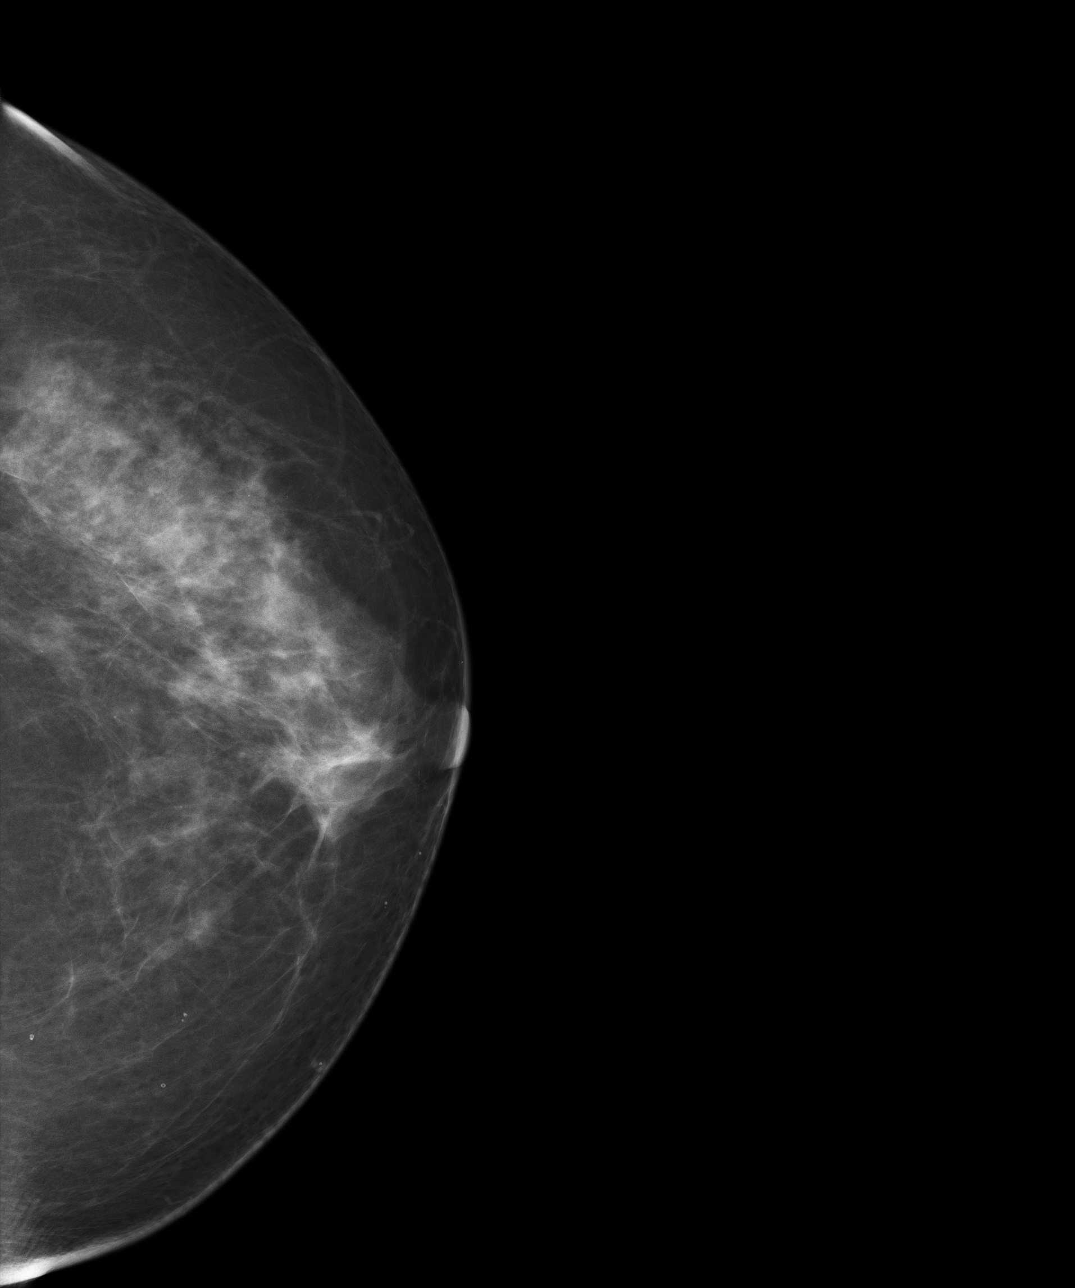
[im 3/4]
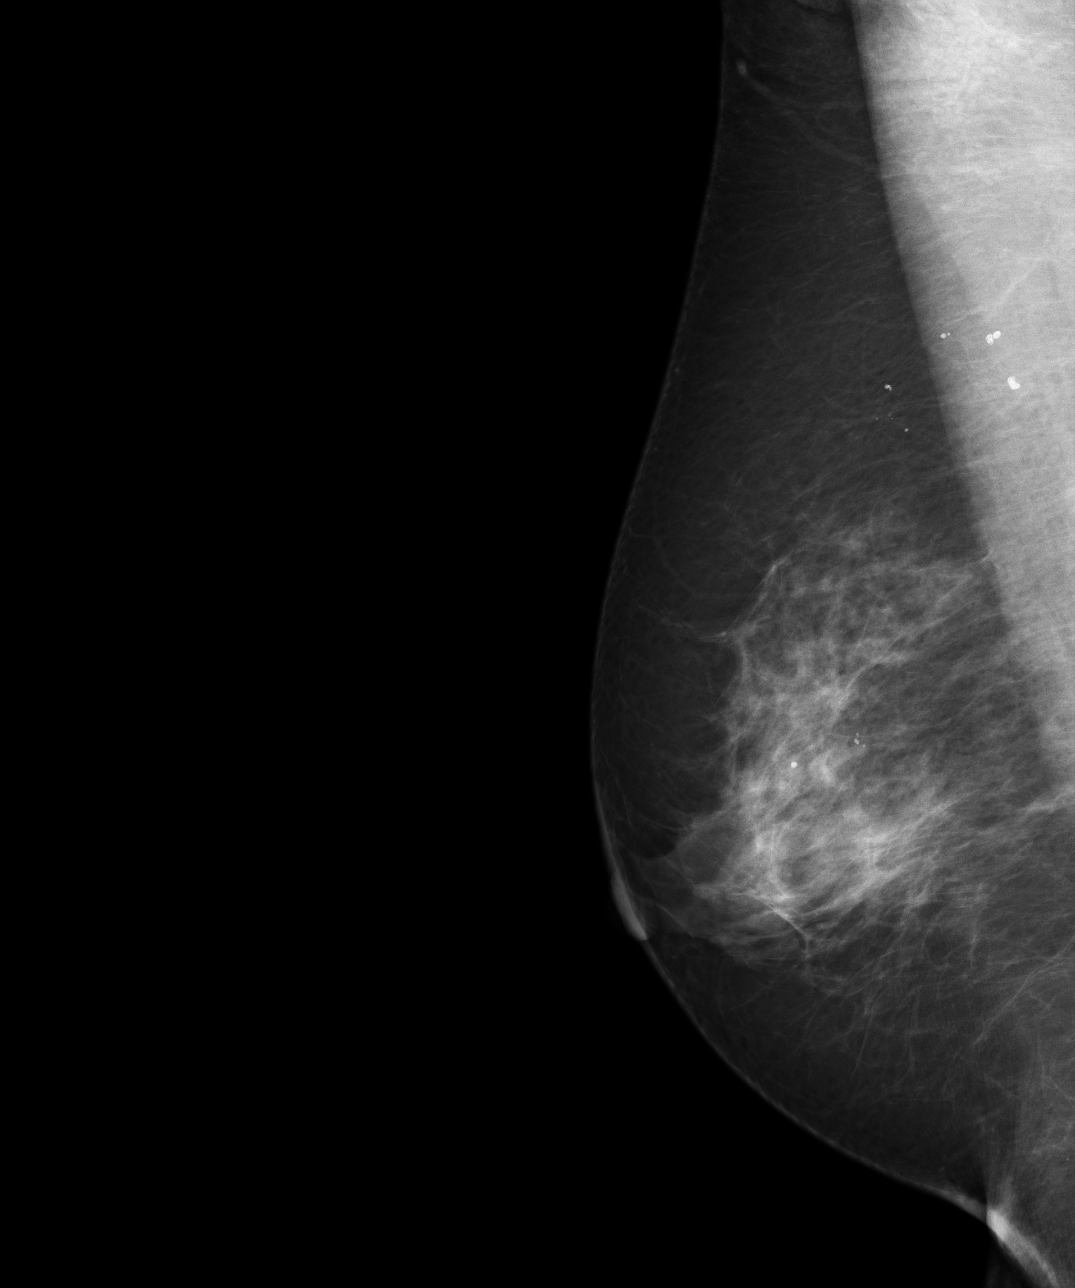
[im 4/4]
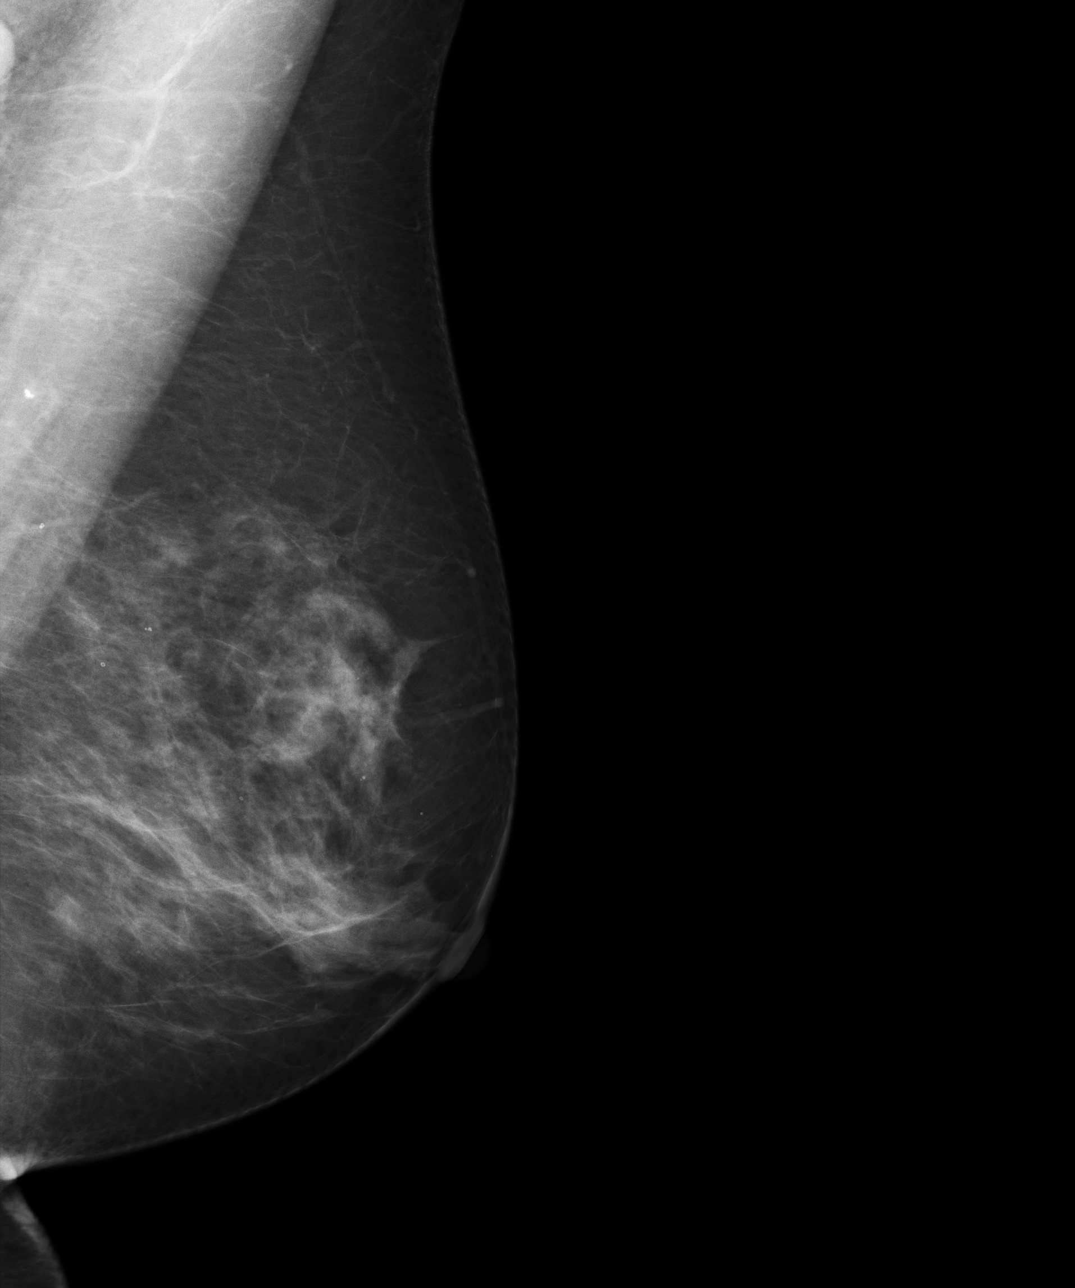

[4 of 4 positions shown; findings below may reference images not displayed]

PROCEDURE:     MAM - MAM DGTL SCREENING MAMMO W/CAD  - March 22, 2009 [DATE]

RESULT:       Comparison is made to film screen images of 08/22/99 and to
digital exams of 01/16/08 as well as 02/18/07 and 05/01/01.

The breasts exhibit a moderately dense parenchymal pattern with stable
calcifications bilaterally. There is no developing density or dominant mass.
IMPRESSION: 1.     Stable benign-appearing bilateral mammogram.
2.     BI-RADS:  Category 2- Benign Finding.
3.     Please continue to encourage annual mammographic follow up.

A negative mammogram report does not preclude biopsy or other evaluation of
a clinically palpable or otherwise suspicious mass or lesion. Breast cancer
may not be detected by mammography in up to 10% of cases.

## 2012-03-24 ENCOUNTER — Ambulatory Visit: Payer: Self-pay | Admitting: Specialist

## 2012-03-24 LAB — BASIC METABOLIC PANEL
Anion Gap: 7 (ref 7–16)
BUN: 14 mg/dL (ref 7–18)
Calcium, Total: 9.1 mg/dL (ref 8.5–10.1)
EGFR (African American): >60
Osmolality: 270 (ref 275–301)
Potassium: 4.4 mmol/L (ref 3.5–5.1)
Sodium: 134 mmol/L — ABNORMAL LOW (ref 136–145)

## 2012-03-24 LAB — URINALYSIS, COMPLETE
Bacteria: NONE SEEN
Bilirubin,UR: NEGATIVE
Leukocyte Esterase: NEGATIVE
Protein: NEGATIVE
RBC,UR: NONE SEEN /HPF (ref 0–5)
Specific Gravity: 1.006 (ref 1.003–1.030)
Squamous Epithelial: NONE SEEN
WBC UR: NONE SEEN /HPF (ref 0–5)

## 2012-03-24 LAB — MRSA PCR SCREENING

## 2012-03-24 LAB — PROTIME-INR: INR: 0.9

## 2012-03-24 LAB — CBC
MCV: 95 fL (ref 80–100)
RDW: 12 % (ref 11.5–14.5)

## 2012-04-01 ENCOUNTER — Inpatient Hospital Stay: Payer: Self-pay | Admitting: Specialist

## 2012-04-02 LAB — CBC WITH DIFFERENTIAL/PLATELET
Basophil #: 0 10*3/uL (ref 0.0–0.1)
Eosinophil #: 0.3 10*3/uL (ref 0.0–0.7)
HCT: 25.3 % — ABNORMAL LOW (ref 35.0–47.0)
HGB: 8.8 g/dL — ABNORMAL LOW (ref 12.0–16.0)
Lymphocyte #: 0.9 10*3/uL — ABNORMAL LOW (ref 1.0–3.6)
Lymphocyte %: 14.8 %
MCHC: 34.8 g/dL (ref 32.0–36.0)
Neutrophil #: 4.3 10*3/uL (ref 1.4–6.5)
Neutrophil %: 72.6 %
RBC: 2.65 10*6/uL — ABNORMAL LOW (ref 3.80–5.20)

## 2012-04-02 LAB — BASIC METABOLIC PANEL
BUN: 9 mg/dL (ref 7–18)
Calcium, Total: 7.8 mg/dL — ABNORMAL LOW (ref 8.5–10.1)
Chloride: 97 mmol/L — ABNORMAL LOW (ref 98–107)
Co2: 27 mmol/L (ref 21–32)
Creatinine: 0.53 mg/dL — ABNORMAL LOW (ref 0.60–1.30)
EGFR (African American): >60
EGFR (Non-African Amer.): >60
Osmolality: 258 (ref 275–301)
Potassium: 3.3 mmol/L — ABNORMAL LOW (ref 3.5–5.1)
Sodium: 129 mmol/L — ABNORMAL LOW (ref 136–145)

## 2012-04-03 LAB — HEMOGLOBIN: HGB: 8.8 g/dL — ABNORMAL LOW (ref 12.0–16.0)

## 2012-04-05 ENCOUNTER — Encounter: Payer: Self-pay | Admitting: Internal Medicine

## 2012-08-05 ENCOUNTER — Ambulatory Visit: Payer: Self-pay | Admitting: Internal Medicine

## 2012-09-22 ENCOUNTER — Ambulatory Visit: Payer: Self-pay | Admitting: Family Medicine

## 2013-03-19 DIAGNOSIS — C50919 Malignant neoplasm of unspecified site of unspecified female breast: Secondary | ICD-10-CM

## 2013-03-19 HISTORY — DX: Malignant neoplasm of unspecified site of unspecified female breast: C50.919

## 2013-05-04 DIAGNOSIS — I10 Essential (primary) hypertension: Secondary | ICD-10-CM | POA: Insufficient documentation

## 2013-05-04 DIAGNOSIS — E785 Hyperlipidemia, unspecified: Secondary | ICD-10-CM | POA: Insufficient documentation

## 2013-08-13 ENCOUNTER — Ambulatory Visit: Payer: Self-pay | Admitting: Internal Medicine

## 2013-08-25 ENCOUNTER — Ambulatory Visit: Payer: Self-pay | Admitting: Internal Medicine

## 2013-08-31 ENCOUNTER — Ambulatory Visit: Payer: Self-pay | Admitting: Internal Medicine

## 2013-08-31 HISTORY — PX: BREAST BIOPSY: SHX20

## 2013-09-02 LAB — PATHOLOGY REPORT

## 2013-09-28 ENCOUNTER — Ambulatory Visit: Payer: Self-pay | Admitting: Surgery

## 2013-10-06 ENCOUNTER — Ambulatory Visit: Payer: Self-pay | Admitting: Surgery

## 2013-10-09 LAB — PATHOLOGY REPORT

## 2013-11-03 ENCOUNTER — Ambulatory Visit: Payer: Self-pay | Admitting: Surgery

## 2013-11-06 LAB — PATHOLOGY REPORT

## 2013-11-11 ENCOUNTER — Ambulatory Visit: Payer: Self-pay | Admitting: Oncology

## 2013-11-17 ENCOUNTER — Ambulatory Visit: Payer: Self-pay | Admitting: Radiation Oncology

## 2013-11-17 ENCOUNTER — Ambulatory Visit: Payer: Self-pay | Admitting: Oncology

## 2013-12-16 LAB — CBC CANCER CENTER
Basophil #: 0 x10 3/mm (ref 0.0–0.1)
Basophil %: 0.3 %
Eosinophil #: 0.2 x10 3/mm (ref 0.0–0.7)
Eosinophil %: 2.2 %
HCT: 37.2 % (ref 35.0–47.0)
HGB: 12.3 g/dL (ref 12.0–16.0)
LYMPHS ABS: 0.7 x10 3/mm — AB (ref 1.0–3.6)
LYMPHS PCT: 8.3 %
MCH: 32.2 pg (ref 26.0–34.0)
MCHC: 33.2 g/dL (ref 32.0–36.0)
MCV: 97 fL (ref 80–100)
MONO ABS: 0.6 x10 3/mm (ref 0.2–0.9)
Monocyte %: 7 %
NEUTROS ABS: 6.5 x10 3/mm (ref 1.4–6.5)
Neutrophil %: 82.2 %
PLATELETS: 347 x10 3/mm (ref 150–440)
RBC: 3.83 10*6/uL (ref 3.80–5.20)
RDW: 12.3 % (ref 11.5–14.5)
WBC: 7.9 x10 3/mm (ref 3.6–11.0)

## 2013-12-17 ENCOUNTER — Ambulatory Visit: Payer: Self-pay | Admitting: Oncology

## 2013-12-23 LAB — CBC CANCER CENTER
BASOS ABS: 0 x10 3/mm (ref 0.0–0.1)
Basophil %: 0.6 %
Eosinophil #: 0.4 x10 3/mm (ref 0.0–0.7)
Eosinophil %: 4.9 %
HCT: 36.9 % (ref 35.0–47.0)
HGB: 12.3 g/dL (ref 12.0–16.0)
LYMPHS PCT: 11.9 %
Lymphocyte #: 0.9 x10 3/mm — ABNORMAL LOW (ref 1.0–3.6)
MCH: 31.9 pg (ref 26.0–34.0)
MCHC: 33.4 g/dL (ref 32.0–36.0)
MCV: 95 fL (ref 80–100)
MONO ABS: 0.6 x10 3/mm (ref 0.2–0.9)
MONOS PCT: 8.2 %
NEUTROS ABS: 5.4 x10 3/mm (ref 1.4–6.5)
NEUTROS PCT: 74.4 %
PLATELETS: 378 x10 3/mm (ref 150–440)
RBC: 3.87 10*6/uL (ref 3.80–5.20)
RDW: 12.1 % (ref 11.5–14.5)
WBC: 7.3 x10 3/mm (ref 3.6–11.0)

## 2013-12-30 LAB — CBC CANCER CENTER
BASOS ABS: 0 x10 3/mm (ref 0.0–0.1)
Basophil %: 0.5 %
EOS PCT: 3.3 %
Eosinophil #: 0.2 x10 3/mm (ref 0.0–0.7)
HCT: 36 % (ref 35.0–47.0)
HGB: 12.1 g/dL (ref 12.0–16.0)
LYMPHS PCT: 9.9 %
Lymphocyte #: 0.7 x10 3/mm — ABNORMAL LOW (ref 1.0–3.6)
MCH: 31.9 pg (ref 26.0–34.0)
MCHC: 33.7 g/dL (ref 32.0–36.0)
MCV: 95 fL (ref 80–100)
MONO ABS: 0.6 x10 3/mm (ref 0.2–0.9)
Monocyte %: 8.1 %
NEUTROS ABS: 5.5 x10 3/mm (ref 1.4–6.5)
NEUTROS PCT: 78.2 %
Platelet: 324 x10 3/mm (ref 150–440)
RBC: 3.8 10*6/uL (ref 3.80–5.20)
RDW: 12 % (ref 11.5–14.5)
WBC: 7.1 x10 3/mm (ref 3.6–11.0)

## 2014-01-06 LAB — CBC CANCER CENTER
BASOS ABS: 0.1 x10 3/mm (ref 0.0–0.1)
Basophil %: 0.8 %
EOS PCT: 3.4 %
Eosinophil #: 0.2 x10 3/mm (ref 0.0–0.7)
HCT: 36.5 % (ref 35.0–47.0)
HGB: 12 g/dL (ref 12.0–16.0)
LYMPHS ABS: 0.5 x10 3/mm — AB (ref 1.0–3.6)
Lymphocyte %: 6.7 %
MCH: 31.4 pg (ref 26.0–34.0)
MCHC: 32.9 g/dL (ref 32.0–36.0)
MCV: 96 fL (ref 80–100)
MONOS PCT: 7.1 %
Monocyte #: 0.5 x10 3/mm (ref 0.2–0.9)
NEUTROS ABS: 6 x10 3/mm (ref 1.4–6.5)
Neutrophil %: 82 %
PLATELETS: 338 x10 3/mm (ref 150–440)
RBC: 3.82 10*6/uL (ref 3.80–5.20)
RDW: 12 % (ref 11.5–14.5)
WBC: 7.3 x10 3/mm (ref 3.6–11.0)

## 2014-01-17 ENCOUNTER — Ambulatory Visit: Payer: Self-pay | Admitting: Oncology

## 2014-02-23 ENCOUNTER — Ambulatory Visit: Payer: Self-pay | Admitting: Oncology

## 2014-03-04 IMAGING — MG MM CAD SCREENING MAMMO
1 series · 4 of 4 positions shown · non-contrast
Comparison: none

REASON FOR EXAM: SCR MAMMO NO ORDER
COMMENTS:

PROCEDURE:     MAM - MAM DGTL SCRN MAM NO ORDER W/CAD  - June 18, 2011  [DATE]
RESULT:      No dominant masses or pathologic clustered calcifications are
demonstrated. The breasts are dense and nodular.  CAD evaluation is
nonfocal.

[R CC · right · 4 of 4 slices shown]
[im 1/4]
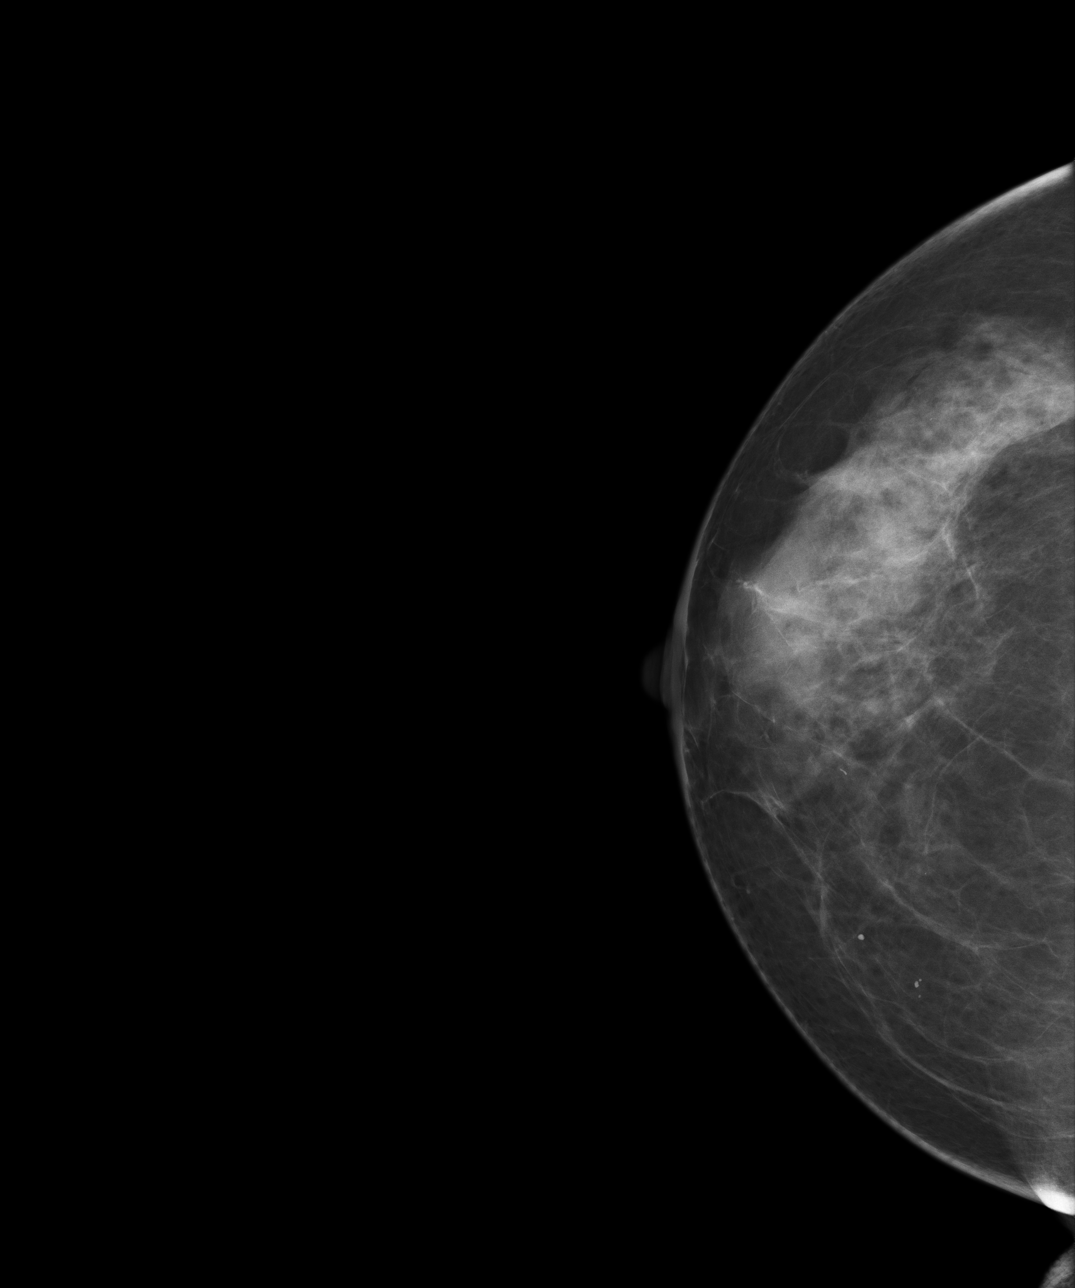
[im 2/4]
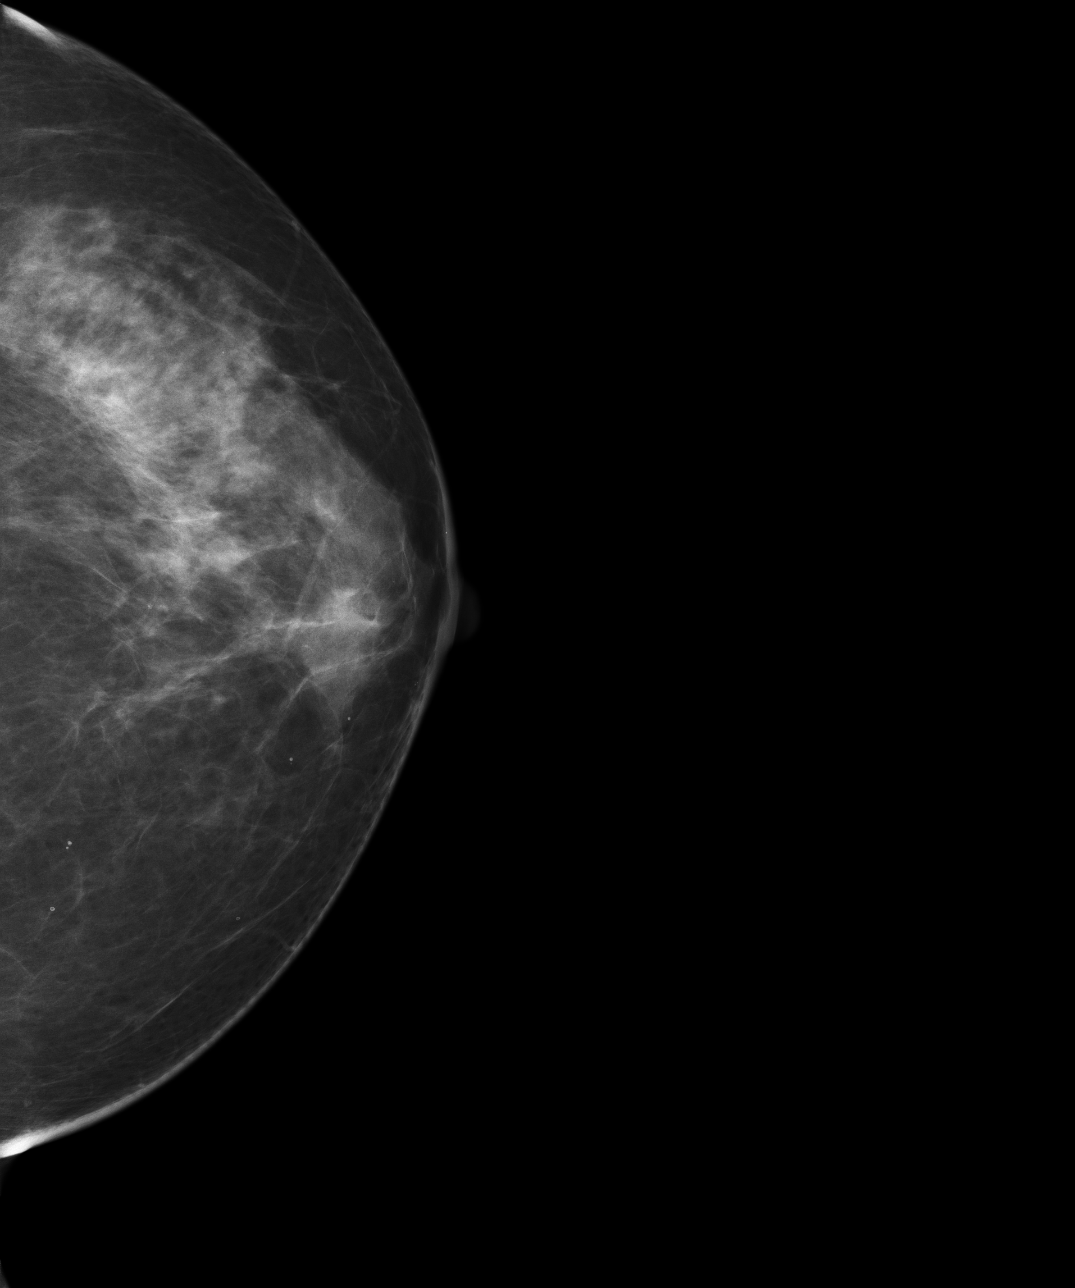
[im 3/4]
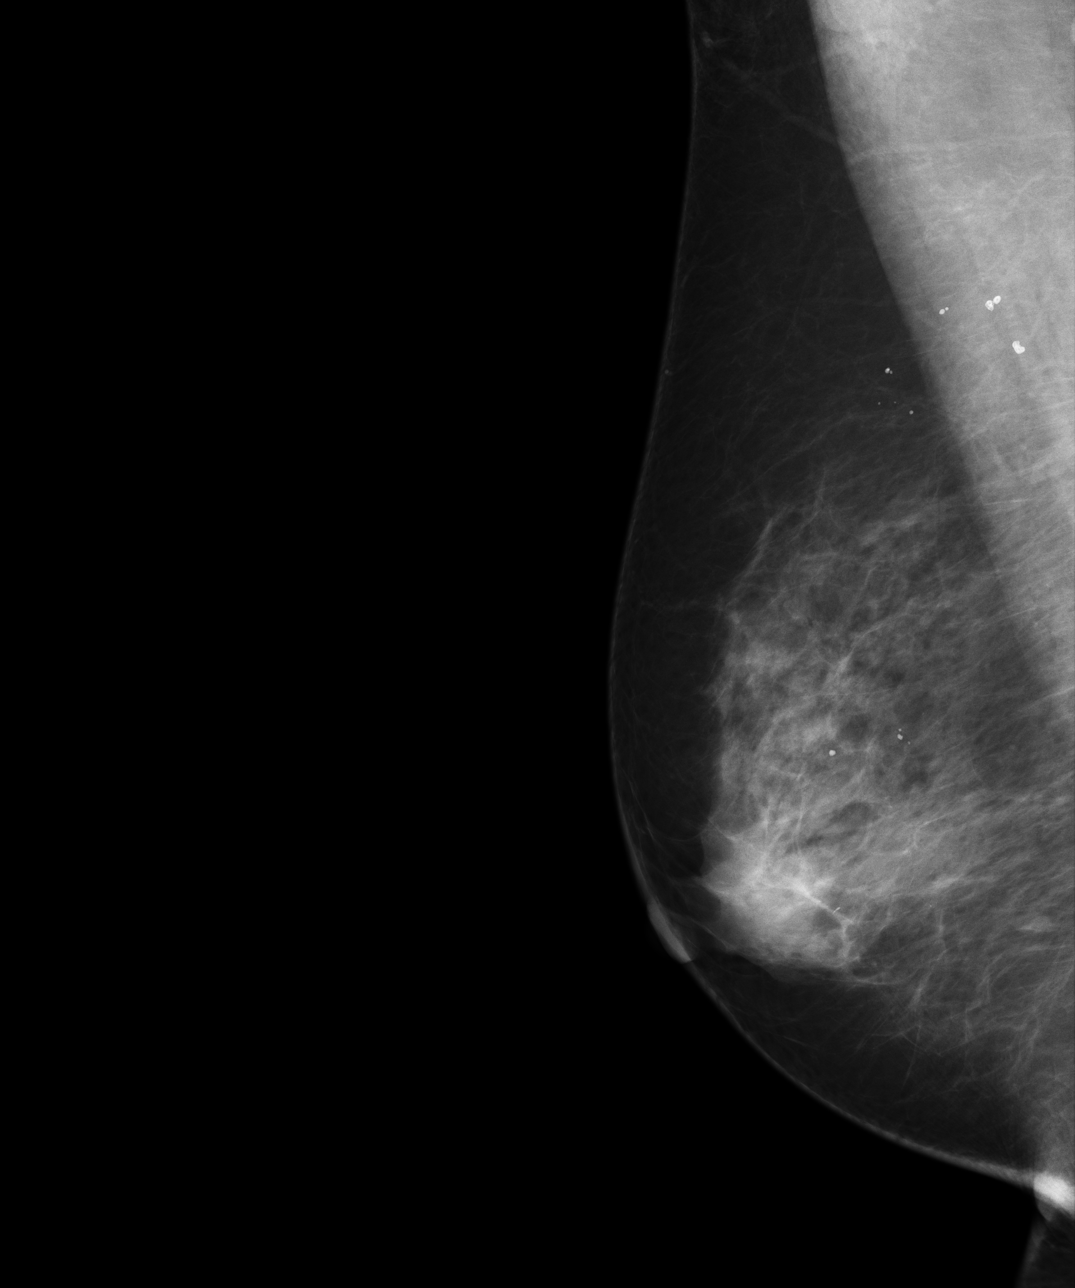
[im 4/4]
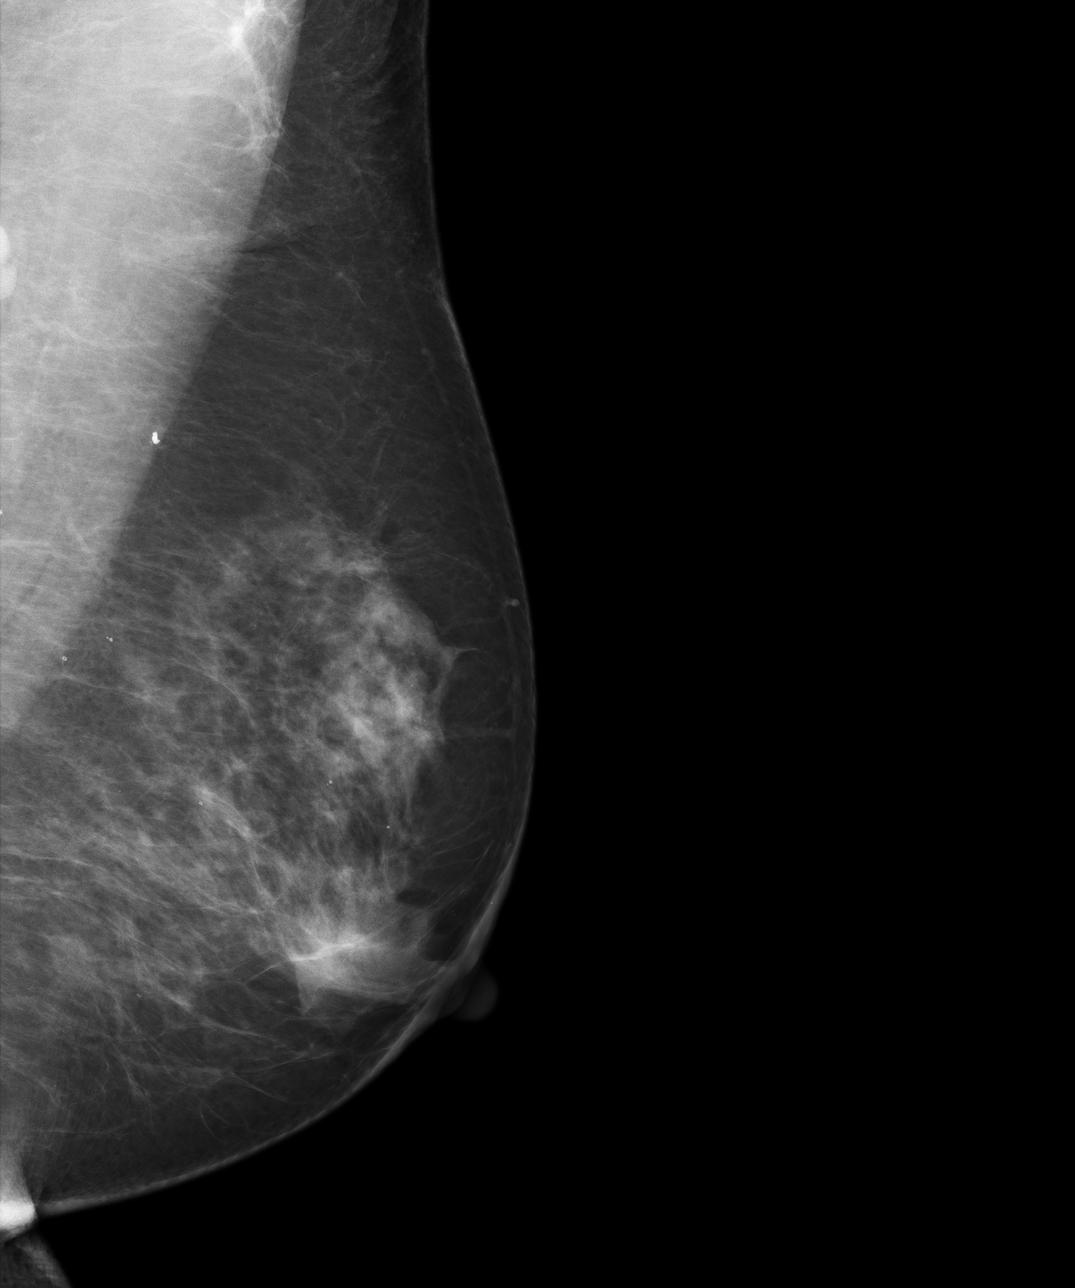

[4 of 4 positions shown; findings below may reference images not displayed]

IMPRESSION: 1.     Benign exam.
2.     Routine yearly follow up mammogram is suggested.
3.     BI-RADS:  Category 2- Benign Finding.

A negative mammogram report does not preclude biopsy or other evaluation of
a clinically palpable or otherwise suspicious mass or lesion.  Breast cancer
may not be detected by mammography in up to 10% of cases.

## 2014-03-23 DIAGNOSIS — M0609 Rheumatoid arthritis without rheumatoid factor, multiple sites: Secondary | ICD-10-CM | POA: Insufficient documentation

## 2014-03-23 DIAGNOSIS — D0590 Unspecified type of carcinoma in situ of unspecified breast: Secondary | ICD-10-CM | POA: Insufficient documentation

## 2014-03-23 DIAGNOSIS — M069 Rheumatoid arthritis, unspecified: Secondary | ICD-10-CM

## 2014-04-19 ENCOUNTER — Ambulatory Visit: Payer: Self-pay | Admitting: Oncology

## 2014-04-19 LAB — COMPREHENSIVE METABOLIC PANEL
ALBUMIN: 3.4 g/dL (ref 3.4–5.0)
ALK PHOS: 72 U/L (ref 46–116)
ANION GAP: 7 (ref 7–16)
AST: 18 U/L (ref 15–37)
BUN: 9 mg/dL (ref 7–18)
Bilirubin,Total: 0.6 mg/dL (ref 0.2–1.0)
Calcium, Total: 8.4 mg/dL — ABNORMAL LOW (ref 8.5–10.1)
Chloride: 99 mmol/L (ref 98–107)
Co2: 30 mmol/L (ref 21–32)
Creatinine: 0.63 mg/dL (ref 0.60–1.30)
EGFR (African American): >60
EGFR (Non-African Amer.): >60
GLUCOSE: 234 mg/dL — AB (ref 65–99)
Osmolality: 278 (ref 275–301)
POTASSIUM: 4.7 mmol/L (ref 3.5–5.1)
SGPT (ALT): 18 U/L (ref 14–63)
SODIUM: 136 mmol/L (ref 136–145)
TOTAL PROTEIN: 7.1 g/dL (ref 6.4–8.2)

## 2014-04-19 LAB — CBC CANCER CENTER
BASOS PCT: 0.4 %
Basophil #: 0 x10 3/mm (ref 0.0–0.1)
EOS PCT: 1.1 %
Eosinophil #: 0.1 x10 3/mm (ref 0.0–0.7)
HCT: 36 % (ref 35.0–47.0)
HGB: 12.1 g/dL (ref 12.0–16.0)
LYMPHS PCT: 8.1 %
Lymphocyte #: 0.6 x10 3/mm — ABNORMAL LOW (ref 1.0–3.6)
MCH: 32.4 pg (ref 26.0–34.0)
MCHC: 33.5 g/dL (ref 32.0–36.0)
MCV: 97 fL (ref 80–100)
MONO ABS: 0.6 x10 3/mm (ref 0.2–0.9)
Monocyte %: 7.3 %
Neutrophil #: 6.6 x10 3/mm — ABNORMAL HIGH (ref 1.4–6.5)
Neutrophil %: 83.1 %
Platelet: 273 x10 3/mm (ref 150–440)
RBC: 3.73 10*6/uL — AB (ref 3.80–5.20)
RDW: 14.2 % (ref 11.5–14.5)
WBC: 8 x10 3/mm (ref 3.6–11.0)

## 2014-05-18 ENCOUNTER — Ambulatory Visit
Admit: 2014-05-18 | Disposition: A | Discharge status: Home / Self Care | Payer: Self-pay | Attending: Oncology | Admitting: Oncology

## 2014-05-20 ENCOUNTER — Ambulatory Visit: Payer: Self-pay | Admitting: Internal Medicine

## 2014-05-25 ENCOUNTER — Ambulatory Visit (INDEPENDENT_AMBULATORY_CARE_PROVIDER_SITE_OTHER): Payer: Medicare Other | Admitting: Internal Medicine

## 2014-05-25 ENCOUNTER — Encounter: Payer: Self-pay | Admitting: Internal Medicine

## 2014-05-25 ENCOUNTER — Encounter (INDEPENDENT_AMBULATORY_CARE_PROVIDER_SITE_OTHER): Payer: Self-pay

## 2014-05-25 VITALS — BP 112/60 | HR 89 | Temp 97.7°F | Ht 62.0 in | Wt 132.0 lb

## 2014-05-25 DIAGNOSIS — J181 Lobar pneumonia, unspecified organism: Secondary | ICD-10-CM | POA: Diagnosis not present

## 2014-05-25 DIAGNOSIS — R059 Cough, unspecified: Secondary | ICD-10-CM

## 2014-05-25 DIAGNOSIS — R05 Cough: Secondary | ICD-10-CM | POA: Diagnosis not present

## 2014-05-25 NOTE — Assessment & Plan Note (Signed)
Ventral diagnosis includes: Malignancy, radiation pneumonitis and underlying infection, upper lobe pneumonia, methotrexate-induced pneumonitis.  Plan: - Continue with Augmentin for 7 days total. - Bronchoscopy with BAL and transbronchial biopsies of the left upper lobe opacity for further evaluation. Will obtain cell count differential, cultures (micro, viral, fungal ) - followup one to 2 weeks after bronchoscopy

## 2014-05-25 NOTE — Patient Instructions (Signed)
Follow up with Dr. Stevenson Clinch 2 week after your bronchoscopy - bronchoscopy with BAL and biopsy using fluoroscopy  - continue with current antibiotic (Augmentin) - cough suppressant as needed

## 2014-05-25 NOTE — Assessment & Plan Note (Signed)
Secondary to left upper lobe consolidation. See plan for left upper lobe consolidation

## 2014-05-25 NOTE — Progress Notes (Signed)
Date: 05/25/2014  MRN# [id number] Ashley Bray [birth year]  Referring Physician: Dr. Gennaro Africa PMD - Dr. Lisette Grinder III  Ashley Bray is a 74 y.o. old female seen in consultation for Lung opacity  CC:  Chief Complaint  Patient presents with  . Advice Only    Pt referred by Dr. Jeb Levering pt had breast ca and took radiation for txt. Pt began having chest pain 3 weeks ago on left side. Pt was told she had pneumo and was treated.    HPI:  Patient is a pleasant 74 year old female presenting today for evaluation of a left upper lobe opacity seen on CT.Patient is referred by Dr. Jeb Levering [facility]), patient has a history of left breast carcinoma (stage 0, Dr. Ferrel Logan in situ) status post wide local resection and adjuvant radiation therapy in August 2015. She also has a seroma of the left breast that is draining by her surgeon. Patient states that she was in [place] on vacation a week and half ago and noted sudden onset of left breast pain she went to the local ED and was admitted and diagnosed the left upper lobe pneumonia and placed on antibiotics. She left her the following day and saw her primary care physician who scheduled a CAT scan for her CAT scan showed left upper lobe mass the and lower lobe groundglass opacities. She had followup with her oncologist who noted that opacities were in the area of the radiation field and area of the previous breast cancer there was concern for possible new malignancy or spread of malignancy, antigenic switch.  Patient was referred for bronchoscopy with BAL and transbronchial biopsies, as requested by [facility] oncology. Currently patient states she has a cough, there is mild thick to white sputum production, intermittent wheezing, mild low-grade fever, mild chills, fatigue. [facility] oncology has placed her on Augmentin for 7 days. Today patient is accompanied by her husband. Of note patient has been recently diagnosed rheumatoid arthritis and has been started on  methotrexate.   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 Estrace after his positive. Progesterone receptors positive. Patient was taken off tamoxifen because of unspecified bone pain on 04/19/2014.   PMHX:   Past Medical History  Diagnosis Date  . Diabetes   . Hypertension   . HLD (hyperlipidemia)   . RA (rheumatoid arthritis)   . Breast CA     left   Surgical Hx:  Past Surgical History  Procedure Laterality Date  . Mastectomy Left 10/2009  . Replacement total knee bilateral      x2  . Rotator cuff repair Right   . Carpal tunnel release Bilateral   . Neck fusion    . Appendectomy    . Abdominal hysterectomy  1991   Family Hx:  No family history on file. Social Hx:   History  Substance Use Topics  . Smoking status: Never Smoker   . Smokeless tobacco: Never Used  . Alcohol Use: 0.0 oz/week    0 Standard drinks or equivalent per week     Comment: occasional wine   Medication:   Current Outpatient Rx  Name  Route  Sig  Dispense  Refill  . amoxicillin-clavulanate (AUGMENTIN) 875-125 MG per tablet   Oral   Take 1 tablet by mouth 2 (two) times daily.         . cyclobenzaprine (FLEXERIL) 10 MG  tablet   Oral   Take 10 mg by mouth as needed for muscle spasms.         . meloxicam (MOBIC) 7.5 MG tablet   Oral   Take 7.5 mg by mouth as needed for pain.         . metFORMIN (GLUCOPHAGE) 500 MG tablet   Oral   Take 500 mg by mouth 2 (two) times daily with a meal.         . methotrexate (RHEUMATREX) 2.5 MG tablet   Oral   Take 20 mg by mouth every 7 (seven) days.         . pravastatin (PRAVACHOL) 40 MG tablet   Oral   Take 40 mg by mouth daily.         . predniSONE (STERAPRED UNI-PAK) 10 MG tablet   Oral   Take 20 tablets by mouth as directed.         . folic acid (FOLVITE) 1 MG tablet   Oral    Take 1 mg by mouth daily.      11       Allergies:  Review of patient's allergies indicates no known allergies.  Review of Systems: Gen:  Mild fever and chills, fatigue HEENT: Denies blurred vision, double vision, ear pain, eye pain, hearing loss, nose bleeds, sore throat Cvc:  No dizziness, chest pain or heaviness Resp:   Cough with productive sputum and mild shortness of breath, wheezing Gi: Denies swallowing difficulty, stomach pain, nausea or vomiting, diarrhea, constipation, bowel incontinence Gu:  Denies bladder incontinence, burning urine Ext:   No Joint pain, stiffness or swelling Skin: No skin rash, easy bruising or bleeding or hives Endoc:  No polyuria, polydipsia , polyphagia or weight change Psych: No depression, insomnia or hallucinations  Other:  All other systems negative  Physical Examination:   VS: BP 112/60 mmHg  Pulse 89  Temp(Src) 97.7 F (36.5 C) (Oral)  Ht 5\' 2"  (1.575 m)  Wt 132 lb (59.875 kg)  BMI 24.14 kg/m2  SpO2 92%  General Appearance: No distress  Neuro:without focal findings, mental status, speech normal, alert and oriented, cranial nerves 2-12 intact, reflexes normal and symmetric, sensation grossly normal  HEENT: PERRLA, EOM intact, no ptosis, no other lesions noticed; Mallampati 2 Pulmonary: shallow respiratory effort, shallow breath sounds, no use of accessory muscles, decreased breath sounds in the posterior aspect of the left midfield, decreased breath sounds in the left lower field. Coarse upper airway sounds and transition to other areas of the lung. CardiovascularNormal S1,S2.  No m/r/g.  Abdominal aorta pulsation normal.    Abdomen: Benign, Soft, non-tender, No masses, hepatosplenomegaly, No lymphadenopathy Renal:  No costovertebral tenderness  GU:  No performed at this time. Endoc: No evident thyromegaly, no signs of acromegaly or Cushing features Skin:   warm, no rashes, no ecchymosis  Extremities: normal, no cyanosis, clubbing, no  edema, warm with normal capillary refill. Other findings:   Labs results:   Rad results: (The following images and results were reviewed by Dr. Stevenson Clinch). 05/20/14 EXAM: CT CHEST WITHOUT CONTRAST  TECHNIQUE: Multidetector CT imaging of the chest was performed following the standard protocol without IV contrast..  COMPARISON: None.  FINDINGS: There is dense consolidation, with air bronchograms, within the left upper lobe extending from the lingula posteriorly and laterally into the mid to upper portion of the left upper lobe, also mostly posteriorly and laterally.  There is a small area of similar consolidation in the posterior left lower  lobe.  There are small areas of ground-glass type opacity noted at both apices which may reflect chronic areas of scarring or additional infiltrate, the former favored.  Airways are patent.  No lung masses or suspicious nodules. No pulmonary edema. There is some volume loss on the left with the right lung mildly hyperexpanded.  No pleural effusion or pneumothorax.  No mediastinal or hilar masses. No pathologically enlarged lymph nodes. Heart normal in size and configuration. Great vessels normal in caliber.  There are changes from left breast surgery. There is a well-defined oval fluid collection in the left lateral breast soft tissues measuring 5.1 cm x 4.7 cm x 5.4 cm. This is consistent with a postoperative seroma.  No neck base or axillary masses or adenopathy.  Limited evaluation of the upper abdomen is unremarkable.  Musculoskeletal: Bony structures are demineralized. There are degenerative changes throughout the visualized spine. No osteoblastic or osteolytic lesions.  IMPRESSION: 1. Consolidation throughout a significant portion of the left upper lobe is consistent with pneumonia. There is no central obstructing lesion. There is a small area of additional pneumonia in the posterior left lower lobe. 2. No other acute  findings. No evidence of malignancy. Small areas of ground-glass opacity noted at the apices are most likely due to scarring. 3. No mediastinal or hilar masses or pathologically enlarged lymph nodes. 4. 5.4 cm fluid collection in the lateral left breast consistent with a postoperative seroma.     Assessment and Plan:74 year old past medical history of breast cancer status post radiation and hormone therapy with local excision now with new left upper lobe opacity No problem-specific assessment & plan notes found for this encounter.   Updated Medication List Outpatient Encounter Prescriptions as of 05/25/2014  Medication Sig  . amoxicillin-clavulanate (AUGMENTIN) 875-125 MG per tablet Take 1 tablet by mouth 2 (two) times daily.  . cyclobenzaprine (FLEXERIL) 10 MG tablet Take 10 mg by mouth as needed for muscle spasms.  . meloxicam (MOBIC) 7.5 MG tablet Take 7.5 mg by mouth as needed for pain.  . metFORMIN (GLUCOPHAGE) 500 MG tablet Take 500 mg by mouth 2 (two) times daily with a meal.  . methotrexate (RHEUMATREX) 2.5 MG tablet Take 20 mg by mouth every 7 (seven) days.  . pravastatin (PRAVACHOL) 40 MG tablet Take 40 mg by mouth daily.  . predniSONE (STERAPRED UNI-PAK) 10 MG tablet Take 20 tablets by mouth as directed.  . folic acid (FOLVITE) 1 MG tablet Take 1 mg by mouth daily.    Orders for this visit: No orders of the defined types were placed in this encounter.     Thank  you for the consultation and for allowing [facility] to assist in the care of your patient. Our recommendations are noted above.  Please contact us if we can be of further service.   Vilinda Boehringer, MD [facility] Office Number: [phone]

## 2014-05-26 ENCOUNTER — Encounter: Payer: Self-pay | Admitting: Internal Medicine

## 2014-05-26 ENCOUNTER — Ambulatory Visit: Payer: Self-pay | Admitting: Internal Medicine

## 2014-06-08 ENCOUNTER — Telehealth: Payer: Self-pay | Admitting: Internal Medicine

## 2014-06-08 NOTE — Telephone Encounter (Signed)
Received call from Dr. Jeb Levering [facility]), concerns for Radiation Pneumonitis. Patient started on Prednisone 50mg  by Dr. Jeb Levering, with a 2.5mg  wean every week.   Dr. 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   Patient has follow up appointment for Thursday.

## 2014-06-10 ENCOUNTER — Encounter: Payer: Self-pay | Admitting: Internal Medicine

## 2014-06-10 ENCOUNTER — Ambulatory Visit (INDEPENDENT_AMBULATORY_CARE_PROVIDER_SITE_OTHER): Payer: Medicare Other | Admitting: Internal Medicine

## 2014-06-10 VITALS — BP 138/68 | HR 107 | Ht 61.0 in | Wt 129.0 lb

## 2014-06-10 DIAGNOSIS — J181 Lobar pneumonia, unspecified organism: Secondary | ICD-10-CM | POA: Diagnosis not present

## 2014-06-10 DIAGNOSIS — Z7952 Long term (current) use of systemic steroids: Secondary | ICD-10-CM

## 2014-06-10 DIAGNOSIS — J700 Acute pulmonary manifestations due to radiation: Secondary | ICD-10-CM | POA: Insufficient documentation

## 2014-06-10 MED ORDER — PREDNISONE 20 MG PO TABS: 20.0000 mg | ORAL_TABLET | Freq: Three times a day (TID) | ORAL | Status: DC

## 2014-06-10 NOTE — Progress Notes (Signed)
MRN# [id number] Ashley Bray 07/14/1940   CC: Chief Complaint  Patient presents with  . 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 Bronchoscopy with BAL showed no atypical organisms and all cultures. 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   Events since last clinic visit: Patient presents today for a followup visit. She was last seen for suspected left upper lobe consolidation and groundglass opacities thought to be possible pneumonia, atypical infection, radiation pneumonitis. She had bronchoscopy performed on 05/26/2014 which he tolerated well. All cultures from that current bronchoscopy currently negative. Patient followup with her oncologist on 06/08/2014 and was told that she restarted on steroids for suspected radiation pneumonitis. At that time she was started on 50 mg of prednisone and advised to followup with pulmonary for further recommendations to treat her left upper lobe abnormality.  Today patient states that she still has low energy, but she is managing okay, but she still endorses a dry cough, it is intermittent at times.  PMHX:   Past Medical History  Diagnosis Date  . Diabetes   . Hypertension   . HLD (hyperlipidemia)   . RA (rheumatoid arthritis)   . Breast CA     left   Surgical Hx:  Past Surgical History  Procedure Laterality Date  . Mastectomy Left 10/2009  . Replacement total knee bilateral      x2  . Rotator cuff repair Right   . Carpal tunnel release Bilateral   . Neck fusion    . Appendectomy    . Abdominal hysterectomy  1991   Family Hx:  History reviewed. No pertinent family history. Social Hx:   History  Substance Use Topics  . Smoking status: Never Smoker    . Smokeless tobacco: Never Used  . Alcohol Use: 0.0 oz/week    0 Standard drinks or equivalent per week     Comment: occasional wine   Medication:   Current Outpatient Rx  Name  Route  Sig  Dispense  Refill  . benzonatate (TESSALON) 100 MG capsule   Oral   Take 1 capsule by mouth 3 (three) times daily as needed.      0   . folic acid (FOLVITE) 1 MG tablet   Oral   Take 1 mg by mouth daily.      11   . insulin aspart (NOVOLOG) 100 UNIT/ML FlexPen      Per Sliding scale         . metFORMIN (GLUCOPHAGE) 500 MG tablet   Oral   Take 500 mg by mouth 2 (two) times daily with a meal.         . methotrexate (RHEUMATREX) 2.5 MG tablet   Oral   Take 20 mg by mouth every 7 (seven) days.         . pravastatin (PRAVACHOL) 40 MG tablet   Oral   Take 40 mg by mouth daily.         . predniSONE (STERAPRED UNI-PAK) 10 MG tablet   Oral   Take 20 tablets by mouth as directed.            Review of Systems: Gen:  Denies  fever, sweats, chills HEENT: Denies blurred vision, double vision, ear pain, eye pain, hearing loss,  nose bleeds, sore throat Cvc:  No dizziness, chest pain or heaviness Resp:   Mild nonproductive cough Gi: Denies swallowing difficulty, stomach pain, nausea or vomiting, diarrhea, constipation, bowel incontinence Gu:  Denies bladder incontinence, burning urine Ext:   No Joint pain, stiffness or swelling Skin: No skin rash, easy bruising or bleeding or hives Endoc:  No polyuria, polydipsia , polyphagia or weight change Psych: No depression, insomnia or hallucinations  Other:  All other systems negative  Allergies:  Review of patient's allergies indicates no known allergies.  Physical Examination:  VS: BP 138/68 mmHg  Pulse 107  Ht 5\' 1"  (1.549 m)  Wt 129 lb (58.514 kg)  BMI 24.39 kg/m2  SpO2 99%  General Appearance: No distress  Neuro: EXAM: without focal findings, mental status, speech normal, alert and oriented, cranial nerves 2-12 grossly  normal  HEENT: PERRLA, EOM intact, no ptosis, no other lesions noticed Pulmonary:Exam: good respiratory effort, mild decreased breath sounds in the left upper lobe, no wheezes or stridor. Cardiovascular:@ Exam:  Normal S1,S2.  No m/r/g.     Abdomen:Exam: Benign, Soft, non-tender, No masses  Skin:   warm, no rashes, no ecchymosis  Extremities: normal, no cyanosis, clubbing, no edema, warm with normal capillary refill.   Labs results:  BMP No results found for: NA, K, CL, CO2, GLUCOSE, BUN, CREATININE   CBC No flowsheet data found.   Rad results:  05/20/14 EXAM: CT CHEST WITHOUT CONTRAST  TECHNIQUE: Multidetector CT imaging of the chest was performed following the standard protocol without IV contrast..  COMPARISON: None.  FINDINGS: There is dense consolidation, with air bronchograms, within the left upper lobe extending from the lingula posteriorly and laterally into the mid to upper portion of the left upper lobe, also mostly posteriorly and laterally.  There is a small area of similar consolidation in the posterior left lower lobe.  There are small areas of ground-glass type opacity noted at both apices which may reflect chronic areas of scarring or additional infiltrate, the former favored.  Airways are patent.  No lung masses or suspicious nodules. No pulmonary edema. There is some volume loss on the left with the right lung mildly hyperexpanded.  No pleural effusion or pneumothorax.  No mediastinal or hilar masses. No pathologically enlarged lymph nodes. Heart normal in size and configuration. Great vessels normal in caliber.  There are changes from left breast surgery. There is a well-defined oval fluid collection in the left lateral breast soft tissues measuring 5.1 cm x 4.7 cm x 5.4 cm. This is consistent with a postoperative seroma.  No neck base or axillary masses or adenopathy.  Limited evaluation of the upper abdomen is  unremarkable.  Musculoskeletal: Bony structures are demineralized. There are degenerative changes throughout the visualized spine. No osteoblastic or osteolytic lesions.  IMPRESSION: 1. Consolidation throughout a significant portion of the left upper lobe is consistent with pneumonia. There is no central obstructing lesion. There is a small area of additional pneumonia in the posterior left lower lobe. 2. No other acute findings. No evidence of malignancy. Small areas of ground-glass opacity noted at the apices are most likely due to scarring. 3. No mediastinal or hilar masses or pathologically enlarged lymph nodes. 4. 5.4 cm fluid collection in the lateral left breast consistent with a postoperative seroma.    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 Treatment for radiation pneumonitis as prolonged steroids starting with high-dose and can last be on 6 months.  Plan: - Will start treatment with high-dose steroids, 60 mg of prednisone daily for one month, at that point will reassess and start tapering. - major side effects of steroids explained to the patient, uncontrolled diabetes, water retention, weakness, bone degradation et Ronney Asters. At this time given that she's been to be on prolonged steroid use will further refer to endocrinology for close monitoring of her diabetes and other complications that, along with chronic steroid use. - chest x-ray 1 month from the initiation of high dose steroids - Radiation pneumonitis treatment and further imaging will be managed by pulmonary    Lung consolidation See plan for radiation  pneumonitis     Updated Medication List Outpatient Encounter Prescriptions as of 06/10/2014  Medication Sig  . benzonatate (TESSALON) 100 MG capsule Take 1 capsule by mouth 3 (three) times daily as needed.  . folic acid (FOLVITE) 1 MG tablet Take 1 mg by mouth daily.  . insulin aspart (NOVOLOG) 100 UNIT/ML FlexPen Per Sliding scale  . metFORMIN (GLUCOPHAGE) 500 MG tablet Take 500 mg by mouth 2 (two) times daily with a meal.  . methotrexate (RHEUMATREX) 2.5 MG tablet Take 20 mg by mouth every 7 (seven) days.  . pravastatin (PRAVACHOL) 40 MG tablet Take 40 mg by mouth daily.  . predniSONE (STERAPRED UNI-PAK) 10 MG tablet Take 20 tablets by mouth as directed.  . [DISCONTINUED] amoxicillin-clavulanate (AUGMENTIN) 875-125 MG per tablet Take 1 tablet by mouth 2 (two) times daily.  . [DISCONTINUED] cyclobenzaprine (FLEXERIL) 10 MG tablet Take 10 mg by mouth as needed for muscle spasms.  . [DISCONTINUED] meloxicam (MOBIC) 7.5 MG tablet Take 7.5 mg by mouth as needed for pain.    Orders for this visit: Orders Placed This Encounter  Procedures  . Ambulatory referral to Endocrinology    Referral Priority:  Routine    Referral Type:  Consultation    Referral Reason:  Specialty Services Required    Number of Visits Requested:  1    Thank  you for the visitation and for allowing  [facility] to assist in the care of your patient. Our recommendations are noted above.  Please contact us if we can be of further service.  Vilinda Boehringer, MD [facility] Office Number: [phone]

## 2014-06-10 NOTE — Assessment & Plan Note (Signed)
See plan for radiation pneumonitis

## 2014-06-10 NOTE — Assessment & Plan Note (Signed)
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 Treatment for radiation pneumonitis as prolonged steroids starting with high-dose and can last be on 6 months.  Plan: - Will start treatment with high-dose steroids, 60 mg of prednisone daily for one month, at that point will reassess and start tapering. - major side effects of steroids explained to the patient, uncontrolled diabetes, water retention, weakness, bone degradation et Ronney Asters. At this time given that she's been to be on prolonged steroid use will further refer to endocrinology for close monitoring of her diabetes and other complications that, along with chronic steroid use. - chest x-ray 1 month from the initiation of high dose steroids - Radiation pneumonitis treatment and further imaging will be managed by pulmonary

## 2014-06-10 NOTE — Patient Instructions (Addendum)
Follow up with Dr. Stevenson Clinch in 4 weeks - please obtain a chest xray prior to the follow up visit (basically chest xray 3-4 weeks after the initiation of steroids - Will start you on prednisone 60mg  daily for 4 weeks, then decide on tapering dose at your follow up visit.  - we will plan for 3 months of steroid treatment for suspected Radiation Pneumonitis, keep in mind that steroid treatment can be longer than 3 months as discussed at your visit today. - You will be on chronic steroids for at least 3 months - therefore will schedule close monitoring with Endocrinology (Dr. Adella Hare, [facility]) - please monitor your weight closely

## 2014-06-16 ENCOUNTER — Telehealth: Payer: Self-pay | Admitting: Internal Medicine

## 2014-06-16 ENCOUNTER — Encounter: Payer: Self-pay | Admitting: Internal Medicine

## 2014-06-16 NOTE — Telephone Encounter (Signed)
Spoke with pt and Pamala Hurry at Dr Sammuel Hines office and advised that ov notes and labs were faxed to [phone]

## 2014-06-18 ENCOUNTER — Ambulatory Visit
Admit: 2014-06-18 | Disposition: A | Discharge status: Home / Self Care | Payer: Self-pay | Attending: Oncology | Admitting: Oncology

## 2014-06-21 ENCOUNTER — Telehealth: Payer: Self-pay | Admitting: Internal Medicine

## 2014-06-21 NOTE — Telephone Encounter (Signed)
Per 3.24.16 ov w/ VM: Patient Instructions       Follow up with Dr. Stevenson Clinch in 4 weeks - please obtain a chest xray prior to the follow up visit (basically chest xray 3-4 weeks after the initiation of steroids - Will start you on prednisone 60mg  daily for 4 weeks, then decide on tapering dose at your follow up visit.   - we will plan for 3 months of steroid treatment for suspected Radiation Pneumonitis, keep in mind that steroid treatment can be longer than 3 months as discussed at your visit today. - You will be on chronic steroids for at least 3 months - therefore will schedule close monitoring with Endocrinology (Dr. Adella Hare, [facility]) - please monitor your weight closely   Called spoke with patient who reported that she is aware of her need for cxr w/ next ov after being on prednisone.  However, she is unsure how she is to have this done?  Is she to be called to schedule this?  Will she have it done just prior to the next ov?  Advised pt will send to [facility] for clarification on this process at our [place] office and we will call her back with recommendations.  Davy Pique, how would you recommend pt go about having her cxr done prior to her next ov?  Thanks!

## 2014-06-22 ENCOUNTER — Other Ambulatory Visit: Payer: Self-pay | Admitting: Internal Medicine

## 2014-06-22 DIAGNOSIS — J700 Acute pulmonary manifestations due to radiation: Secondary | ICD-10-CM

## 2014-06-22 NOTE — Telephone Encounter (Signed)
Called patient to let she is to come by this office to pick up her cxr order. [place] [facility] [place] [facility] [place] the week of April 18th.

## 2014-07-02 ENCOUNTER — Other Ambulatory Visit: Payer: Self-pay | Admitting: Internal Medicine

## 2014-07-02 DIAGNOSIS — Z853 Personal history of malignant neoplasm of breast: Secondary | ICD-10-CM

## 2014-07-05 ENCOUNTER — Ambulatory Visit
Admit: 2014-07-05 | Disposition: A | Discharge status: Home / Self Care | Payer: Self-pay | Attending: Internal Medicine | Admitting: Internal Medicine

## 2014-07-06 ENCOUNTER — Telehealth: Payer: Self-pay | Admitting: Internal Medicine

## 2014-07-06 MED ORDER — PREDNISONE 20 MG PO TABS: 60.0000 mg | ORAL_TABLET | Freq: Every day | ORAL | Status: DC

## 2014-07-06 NOTE — Telephone Encounter (Signed)
Spoke with the pt and notified that Rx was sent to pharm  Nothing further needed per pt

## 2014-07-09 NOTE — Discharge Summary (Signed)
PATIENT NAME:  Ashley Bray, Ashley Bray MR#:  [id number] DATE OF BIRTH:  [birth year]  DATE OF ADMISSION:  04/01/2012 DATE OF DISCHARGE:  04/04/2012  FINAL DIAGNOSES:  1. Advanced osteoarthritis, right knee.  2. Diabetes mellitus.  3. Hypertension.  4. High cholesterol.   OPERATIONS: On 04/01/2012, cemented DePuy rotating platform LCS total knee replacement.   COMPLICATIONS: None.   CONSULTATIONS: None.   HOME AND DISCHARGE MEDICATIONS:  1. Pravastatin 40 mg daily.  2. Benicar 20/12 .5 daily.  3. Metformin 500 mg b.i.d.  4. Norco 10/325 p.r.n. pain.  5. Iron 1 p.o. daily.  6. Neurontin 400 mg b.i.d.  7. Potassium 20 mEq daily.  8. Tramadol p.r.n. pain.  9. Enteric-coated aspirin 1 p.o. b.i.d. 10. Milk of Magnesia p.r.n.   HISTORY: The patient is a 74 year old female with advanced osteoarthritis of the right knee. She had been treated for several years with NSAIDS, steroid injections, bracing, exercise and rest. She had pain on a continuous basis and was unable to perform normal daily activities as she would like. She had difficulty with shopping, walking and housekeeping. She noted some increasing contracture of the knee. X-rays of the right knee showed minimal angular deformity, but severe spurring in all 3 compartments with sclerosis and cyst formation present. The patient had a successful left total knee replacement in the past and wished to proceed with a right total knee replacement.   PAST MEDICAL HISTORY:  ILLNESSES: As above.   OPERATIONS: Rotator cuff repair, right; appendectomy; left total knee; hysterectomy; arthroscopy of right knee; and C4-C5 fusion.   MEDICATIONS: As above.   ALLERGIES: CODEINE.   REVIEW OF SYSTEMS: Unremarkable.   FAMILY HISTORY: Unremarkable.   SOCIAL HISTORY: The patient lives with her husband. Does not smoke or drink.   PHYSICAL EXAMINATION: On admission, the patient was alert and cooperative. The right knee showed a flexion contracture of 25  degrees. Further flexion of 95 degrees. She had tenderness along both joint lines. Stability was good. Her neurovascular status was good distally, and the skin was intact.   LABORATORY DATA: On admission, was satisfactory.   HOSPITAL COURSE: On 04/01/2012, the patient underwent cemented LCS rotating platform right total knee replacement. Postoperatively, she did well. Hemoglobin dropped to 8.8 and remained stable there. She did well with physical therapy and was stable and able to be transferred to a skilled nursing facility on 04/04/2012. Her rehabilitation potential is good. She will be partial weightbearing on a walker. She will return to my office in 2 weeks for exam and x-ray.    ____________________________ Park Breed, MD hem:OSi D: 04/04/2012 13:58:10 ET T: 04/05/2012 09:01:36 ET JOB#: [id number]  cc: Park Breed, MD, <Dictator> John B. Sarina Ser, MD Park Breed MD ELECTRONICALLY SIGNED 04/06/2012 13:58

## 2014-07-09 NOTE — H&P (Signed)
    Subjective/Chief Complaint Pain right knee    History of Present Illness 74 year old female has had long term right knee pain and has had extensive non operative treatment for this including NSAIDs., injections, bracing, exercises, and rest.  These fail to provide relief for everyday activities such as climbing stairs, shopping, ad exercising. Has a successful left total knee replacement.  She wishes to proceed with right total knee replacement.  X-rays show advanced osteoarthritis with spurs and cysts, sclerosis.   Past Med/Surgical Hx:  Diabetes:   Rotator cuff tear: Right  Appendectomy:   Left knee:   Left knee:   Hysterectomy:   Arthroscopy right knee:   Fusion C4 and C5:   ALLERGIES:  Codeine: N/V  HOME MEDICATIONS: Medication Instructions Status  pravastatin 40 mg oral tablet 1 tab(s) orally once a day (at bedtime) Active  Benicar HCT 20 mg-12.5 mg oral tablet 1 tab(s) orally once a day (at bedtime) Active  meloxicam 7.5 mg oral tablet 1 tab(s) orally once a day, As Needed Active  metformin 500 mg oral tablet 1 tab(s) orally 2 times a day Active   Family and Social History:   Family History Non-Contributory    Social History negative tobacco    Place of Living Home   Review of Systems:   Fever/Chills No    Cough No    Sputum No    Abdominal Pain No   Physical Exam:   GEN well developed, well nourished, no acute distress    HEENT pink conjunctivae    NECK supple    RESP normal resp effort    CARD regular rate    ABD denies tenderness  soft    LYMPH negative neck    EXTR negative edema, Right knee 5-95*.  Tender both joint lines with good stability.  circulation/sensation/motor function good distally    SKIN normal to palpation    NEURO motor/sensory function intact    PSYCH alert, A+O to time, place, person, good insight     Assessment/Admission Diagnosis Advanced osteoarthritis right knee    Plan right total knee replacement   Electronic  Signatures: Park Breed (MD)  (Signed 13-Jan-14 19:13)  Authored: CHIEF COMPLAINT and HISTORY, PAST MEDICAL/SURGIAL HISTORY, ALLERGIES, HOME MEDICATIONS, FAMILY AND SOCIAL HISTORY, REVIEW OF SYSTEMS, PHYSICAL EXAM, ASSESSMENT AND PLAN   Last Updated: 13-Jan-14 19:13 by Park Breed (MD)

## 2014-07-09 NOTE — Op Note (Signed)
PATIENT NAME:  Ashley Bray, Ashley Bray MR#:  [id number] DATE OF BIRTH:  1940/04/06  DATE OF PROCEDURE:  04/01/2012  PREOPERATIVE DIAGNOSIS: Advanced osteoarthritis, right knee.   POSTOPERATIVE DIAGNOSIS: Advanced osteoarthritis, right knee.   PROCEDURE PERFORMED: Right cemented DePuy LCS rotating platform total knee replacement (standard plus femur/patella, #3 keeled tibia, 10 mm polyethylene insert).   SURGEON: Park Breed, M.D.   ASSISTANT: Christophe Louis, M.D.   ANESTHESIA: Spinal with narcotics.   COMPLICATIONS: None.   DRAINS: Two Autovacs.   ESTIMATED BLOOD LOSS: 25 mL.  REPLACEMENTS: None.   DESCRIPTION OF PROCEDURE: The patient was brought to the operating room where she underwent satisfactory spinal anesthesia and was placed in the supine position and padded appropriately on operating room table. The right leg was prepped and draped in sterile fashion. Preoperatively, she was noted to have about a 25 degree flexion contracture. There was not much angular deformity. After thorough prepping and draping, the Esmarch was applied and the tourniquet inflated to 350 mmHg. Tourniquet time was 99 minutes. An anterior midline incision was made and dissection carried out sharply through subcutaneous tissue. Skin was very thin and there was very little subcutaneous tissue distally. The medial arthrotomy was carried out and soft tissue debridement carried out in the knee. The anterior tibial cutting guide was pinned in place and the proximal tibial cut made taking minimal amount of tibia. The femur was sized as standard plus and a centering hole was made. The ligaments were checked and were well balanced. The anterior femoral cutting guide was affixed with rotation guide and then pinned in place. The anterior and posterior cuts were made. The distal femoral cutting guide, 4 degrees valgus, was inserted and pinned in place. The distal cut was made. The flexion gap had been established at 10 mm and  was quite stable. The extension gap also was quite stable at 10 mm and allowed full extension. The femoral finishing guide was applied and the cuts made. The tibia was then sized at #3. The cutting jig was pinned in place and the centering hole made. The 10 mm polyethylene insert was put in place along with a trial standard plus femoral component and the knee articulated well. She had full extension and good flexion and good stability. The patella was cut and drilled and the trial inserted. This tracked well also. Trials were removed and the knee thoroughly irrigated and final soft tissue debridement carried out and the knee thoroughly dried while cement was mixed. The #3 keeled tibia, standard plus femur and standard plus patella were all cemented in place and articulated with a 10 mm rotating platform polyethylene. After all cement had hardened and excess cement was removed, the knee was thoroughly irrigated again. Bone wax was used on raw bone surfaces and the soft tissues were infiltrated with 60 mL of 0.25% Marcaine with epinephrine, morphine, and Toradol. Hemovacs were inserted and the deep capsule was closed with #2 quill suture. Subcutaneous tissue was closed with zero quill suture. There was a vertical tear in the skin inferomedially during the procedure and the subcutaneous tissue here was repaired with 4-0 Vicryl. Staples were used to close the wounds. TENS pads and dry sterile dressing were applied. The tourniquet was deflated with good return of blood flow to the foot. The Autovac was hooked up and activated and a Polar Care and knee immobilizer applied. The patient was then transferred to her hospital bed and taken to recovery in good condition. ____________________________ Park Breed,  MD hem:sb D: 04/01/2012 11:45:32 ET T: 04/01/2012 12:03:39 ET JOB#: [id number]  cc: Park Breed, MD, <Dictator> Park Breed MD ELECTRONICALLY SIGNED 04/01/2012 12:56

## 2014-07-10 NOTE — Op Note (Signed)
PATIENT NAME:  Ashley Bray, Ashley Bray MR#:  [id number] DATE OF BIRTH:  04-20-1940  DATE OF PROCEDURE:  11/03/2013  PREOPERATIVE DIAGNOSIS: Ductal carcinoma in situ, left breast.   POSTOPERATIVE DIAGNOSIS: Ductal carcinoma in situ, left breast.   PROCEDURE: Left partial mastectomy.   SURGEON: Rochel Brome, MD  ANESTHESIA: General.   INDICATIONS: This 74 year old female recently had excision of left breast mass with findings of ductal carcinoma in situ with involvement of a margin and additional surgery was recommended for definitive treatment.   DESCRIPTION OF PROCEDURE: The patient was placed on the operating table in the supine position under general anesthesia. The left arm was placed on a lateral arm support. The left breast was prepared with ChloraPrep and draped in a sterile manner.   A curvilinear incision was made from approximately 2 o'clock to 4 o'clock position of the left breast some 8 cm from the nipple and carried down through subcutaneous tissues and encountered some firmness which represented the recent excision site and while palpating the firm area the dissection was carried around the surgical site and dissected through fatty tissues down into glandular tissue of the breast and completely re-excised the surgical margins. The specimen was marked with a stitch at the 4 o'clock position for the pathologist's orientation and also used the margin map to suture labels for the cranial, caudal, medial, lateral, and deep margins. The specimen was submitted for pathology.   The wound was inspected. There was no remaining mass within the wound. Two clamped vessels were suture ligated with 4-0 chromic. Several small bleeding points were cauterized. Hemostasis subsequently appeared to be intact. The tissues surrounding the cautery artifact were infiltrated with 0.5% Sensorcaine with epinephrine and also subcutaneous tissues were infiltrated. The subcutaneous tissues were approximated with interrupted  4-0 chromic and then the skin was closed with running 4-0 Monocryl subcuticular suture and Dermabond. The patient tolerated surgery satisfactorily and was then prepared for transfer to the recovery room. ____________________________ Lenna Sciara. Rochel Brome, MD jws:sb D: 11/03/2013 09:10:21 ET T: 11/03/2013 11:30:35 ET JOB#: [id number]  cc: Loreli Dollar, MD, <Dictator> Loreli Dollar MD ELECTRONICALLY SIGNED 11/04/2013 17:59

## 2014-07-10 NOTE — Op Note (Signed)
PATIENT NAME:  Ashley Bray, MARS MR#:  [id number] DATE OF BIRTH:  1940-05-28  DATE OF PROCEDURE:  10/06/2013  PREOPERATIVE DIAGNOSIS: Left breast mass.   POSTOPERATIVE DIAGNOSIS: Left breast mass.   PROCEDURE: Excision of left breast mass.   SURGEON: Rochel Brome, MD  ANESTHESIA: General.   INDICATIONS: This 74 year old female had recent mammogram depicting microcalcifications of the outer aspect of the left breast. 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 Excision of the mass was recommended for definitive treatment.   The patient did have preoperative x-ray needle localization. The mammogram images were reviewed prior to surgery.   DESCRIPTION OF PROCEDURE: The patient was placed on the operating table in the supine position under general anesthesia. The dressing was removed from the left breast exposing the Kopans wire, which entered the peripheral aspect of the left breast at approximately the 3 o'clock position. The wire was cut 2 cm from the skin. The breast and surrounding chest wall were prepared with ChloraPrep and draped in a sterile manner.   A curvilinear incision was made from approximately 2 o'clock to 4 o'clock position of the left breast, carried down through subcutaneous tissues. Electrocautery was used for hemostasis. A number of small bleeding points were cauterized. The dissection was carried down to encounter the wire and then dissected a portion of tissue surrounding the wire, approximately 2 x 2 x 2 cm in dimension, which was resected. There was some firmness within this tissue. The specimen was submitted with the wire for specimen mammogram. The wound was inspected. Several additional small bleeding points were cauterized. The wound was observed for several minutes and determined hemostasis was intact. The deeper tissues surrounding cautery artifact was infiltrated with 0.5% Sensorcaine with  epinephrine. I also infiltrated subcutaneous tissues as well. Next, the subcutaneous tissues were closed with interrupted 4-0 chromic. The skin was closed with running 4-0 Monocryl subcuticular suture and Dermabond. The patient tolerated surgery satisfactorily and was then prepared for transfer to the recovery room. The specimen mammogram demonstrated the presence of the biopsy marker within the central aspect of the specimen. ____________________________ J. Rochel Brome, MD jws:sb D: 10/06/2013 11:30:35 ET T: 10/06/2013 12:02:05 ET JOB#: [id number]  cc: Loreli Dollar, MD, <Dictator> Loreli Dollar MD ELECTRONICALLY SIGNED 10/07/2013 9:10

## 2014-07-10 NOTE — Consult Note (Signed)
Reason for Visit: This 74 year old Female patient presents to the clinic for initial evaluation of  breast cancer .   Referred by Dr. Tamala Julian.  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  Pathology Report pathology report reviewed   Imaging Report mammograms reviewed   Referral Report clinical notes reviewed   Planned Treatment Regimen hypofractionated whole breast radiation   HPI   patient is a 74 year old female who presented with an abnormal mammogram of her left breast showing a 7 mm area of clustered mildly pleomorphic calcifications in the upper-outer quadrant of the left breast.she underwent stereotactic guided biopsy which was positive for ductal carcinoma in situ. Margins were positive reexcision showed clear margins at 9 mm only 1 mm residual focus of DCIS was seen. I am still trying to track down ER/PR status.patient has multiple comorbidities including type 2 diabetes and hypertension. She has done well postoperatively. She still somewhat sore in her incision site although it appears to be healing well. She is seen today for consideration of adjuvant radiation therapy.  Past Hx:    Diabetes:    Rotator cuff tear: Right, 2006   Appendectomy: 1949   Left knee: 1958   Left knee: Aug 1969   Hysterectomy: May 1991   Arthroscopy right knee: Sep 2007   Fusion C4 and C5: Mar 2007  Past, Family and Social History:  Past Medical History positive   Endocrine diabetes mellitus   Past Surgical History appendectomy; cervical fusion C4-5, arthroscopic right knee surgery, hysterectomy,  rotator cuff tear   Family History positive   Family History Comments family history of cardiovascular disease Brother with pancreatic cancer  and brother with liver and lung cancer   Social History noncontributory   Additional Past Medical  and Surgical History seen accompanied by her husband today   Allergies:   Codeine: N/V  Home Meds:  Home Medications: Medication Instructions Status  tamoxifen 20 mg oral tablet 1 tab(s) orally once a day - to start when finishes radiation treatments Active  pravastatin 40 mg oral tablet 1 tab(s) orally once a day (at bedtime) Active  Benicar HCT 12.5 mg-20 mg oral tablet 1 tab(s) orally once a day Active  metformin 500 mg oral tablet 1 tab(s) orally 2 times a day Active   Review of Systems:  General negative   Performance Status (ECOG) 0   Skin negative   Breast see HPI   Ophthalmologic negative   ENMT negative   Respiratory and Thorax negative   Cardiovascular negative   Gastrointestinal negative   Genitourinary negative   Musculoskeletal negative   Neurological negative   Psychiatric negative   Hematology/Lymphatics negative   Endocrine negative   Allergic/Immunologic negative   Review of Systems   denies any weight loss, fatigue, weakness, fever, chills or night sweats. Patient denies any loss of vision, blurred vision. Patient denies any ringing  of the ears or hearing loss. No irregular heartbeat. Patient denies heart murmur or history of fainting. Patient denies any chest pain or pain radiating to her upper extremities. Patient denies any shortness of breath, difficulty breathing at night, cough or hemoptysis. Patient denies any swelling in the lower legs. Patient denies any nausea vomiting, vomiting of blood, or coffee ground material in the vomitus. Patient denies any stomach pain. Patient states has had normal bowel movements no significant constipation or  diarrhea. Patient denies any dysuria, hematuria or significant nocturia. Patient denies any problems walking, swelling in the joints or loss of balance. Patient denies any skin changes, loss of hair or loss of weight. Patient denies any excessive worrying or anxiety or significant depression. Patient denies  any problems with insomnia. Patient denies excessive thirst, polyuria, polydipsia. Patient denies any swollen glands, patient denies easy bruising or easy bleeding. Patient denies any recent infections, allergies or URI. Patient "s visual fields have not changed significantly in recent time.   Nursing Notes:  Nursing Vital Signs and Chemo Nursing Nursing Notes: *CC Vital Signs Flowsheet:   02-Sep-15 09:05  Temp Temperature 97.2  Pulse Pulse 75  Respirations Respirations 20  SBP SBP 133  DBP DBP 72  Pain Scale (0-10)  0  Current Weight (kg) (kg) 61.4  Height (cm) centimeters 155  BSA (m2) 1.6   Physical Exam:  General/Skin/HEENT:  General normal   Skin normal   Eyes normal   ENMT normal   Head and Neck normal   Additional PE well-developed elderly female in NAD. She is status post wide local excision of the left breast. Incision is healing well. No dominant mass or nodularity is noted in either breast in 2 positions examined. Lungs are clear to A&P cardiac examination shows regular rate and rhythm. Abdomen is benign.   Breasts/Resp/CV/GI/GU:  Respiratory and Thorax normal   Cardiovascular normal   Gastrointestinal normal   Genitourinary normal   MS/Neuro/Psych/Lymph:  Musculoskeletal normal   Neurological normal   Lymphatics normal   Other Results:  Radiology Results: LabUnknown:    09-Jun-15 10:44, Digital Additional Views Lt Breast [facility])  PACS Image   [facility]:  Digital Additional Views Lt Breast (SCR)   REASON FOR EXAM:    av lt calcifications  COMMENTS:       PROCEDURE: MAM - MAM DGTL ADD VW LT  SCR  - Aug 25 2013 10:44AM     CLINICAL DATA:  Further evaluation of left breast calcification    EXAM:  DIGITAL DIAGNOSTIC  LEFT MAMMOGRAM    COMPARISON:Multiple prior studies including 08/13/2013    ACR Breast Density Category c: The breast tissue is heterogeneously  dense, which may obscure small masses.  FINDINGS:  At the middle third depth  in the upper-outer quadrant of the left  breast, there are clustered mildly pleomorphic calcifications  measuring 7 mm in maximal diameter. There are 2 adjacent clusters  separated by about 7 mm.     IMPRESSION:  Two closely adjacent clusters of suspicious calcifications    RECOMMENDATION:  Recommend stereotactic core needle biopsy of both adjacent clusters  of calcifications.    I have discussed the findings and recommendations with the patient.  Results were also provided in writing at the conclusion of the  visit. The patient's physician will be contacted to obtain an order  for this procedure.    BI-RADS CATEGORY  4: Suspicious.      Electronically Signed    By: Skipper Cliche M.D.    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 I would like you to hypofractionated course of treatment and will  treat to40-50 cGy in 16 fractions. We'll also boost her scar another 1400 cGy based on the close margin. Risks and benefits of treatment including skin reaction, fatigue, inclusion of superficial lung alteration of blood counts were all described in detail to the patient. She seems to comprehend my treatment plan well. I have set her up in about a week's time for CT simulation. I like her scar to heal little bit more.  I will also refer back to medical oncology for determination of aromatase inhibitor status after completion of therapy.  I would like to take this opportunity for allowing me to participate in the care of your patient..  Fax to Physician:  Physicians To Recieve Fax: Madelyn Brunner, MD - [phone] Rochel Brome - [phone].  Electronic Signatures: Maryn Freelove, Roda Shutters (MD)   (Signed 02-Sep-15 11:45)  Authored: HPI, Diagnosis, Past Hx, PFSH, Allergies, Home Meds, ROS, Nursing Notes, Physical Exam, Other Results, Relevent Results, Encounter Assessment and Plan, Fax to Physician   Last Updated: 02-Sep-15 11:45 by Armstead Peaks (MD)

## 2014-07-12 ENCOUNTER — Ambulatory Visit: Payer: Self-pay | Admitting: Internal Medicine

## 2014-07-14 ENCOUNTER — Encounter: Payer: Self-pay | Admitting: Internal Medicine

## 2014-07-14 ENCOUNTER — Ambulatory Visit (INDEPENDENT_AMBULATORY_CARE_PROVIDER_SITE_OTHER): Payer: Medicare Other | Admitting: Internal Medicine

## 2014-07-14 VITALS — BP 108/60 | HR 75 | Temp 97.5°F | Ht 61.0 in | Wt 133.0 lb

## 2014-07-14 DIAGNOSIS — R05 Cough: Secondary | ICD-10-CM | POA: Diagnosis not present

## 2014-07-14 DIAGNOSIS — R059 Cough, unspecified: Secondary | ICD-10-CM

## 2014-07-14 DIAGNOSIS — J700 Acute pulmonary manifestations due to radiation: Secondary | ICD-10-CM

## 2014-07-14 DIAGNOSIS — J181 Lobar pneumonia, unspecified organism: Secondary | ICD-10-CM

## 2014-07-14 NOTE — Patient Instructions (Addendum)
Follow up with Dr. Stevenson Clinch in 6 weeks - Will wean your prednisone to the following - 40mg  (2pills) x 3 weeks (4/28-5/12), then 30mg  until follow up appointment. Please try to take with breakfast - please monitor your weight closely and blood glucose levels - we will plan for a repeat CT Chest without contrast in July or August of 2016.

## 2014-07-14 NOTE — Assessment & Plan Note (Signed)
See plan for radiation pneumonitis

## 2014-07-14 NOTE — Progress Notes (Signed)
MRN# [id number] Ashley Bray [birth year]   CC: Chief Complaint  Patient presents with  . Follow-up    pt still has some sob, she is not sure if its anxiety. She is still on 60 mg of Prednisone. Pt is not having any cough.      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 Bronchoscopy with BAL showed no atypical organisms and all cultures. 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   06/10/14 ROV: Patient presents today for a followup visit. She was last seen for suspected left upper lobe consolidation and groundglass opacities thought to be possible pneumonia, atypical infection, radiation pneumonitis. She had bronchoscopy performed on 05/26/2014 which he tolerated well. All cultures from that current bronchoscopy currently negative. Patient followup with her oncologist on 06/08/2014 and was told that she restarted on steroids for suspected radiation pneumonitis. At that time she was started on 50 mg of prednisone and advised to followup with pulmonary for further recommendations to treat her left upper lobe abnormality.  Today patient states that she still has low energy, but she is managing okay, but she still endorses a dry cough, it is intermittent at times. PLAN:high dose steroids, and repeat cxr  Events since last clinic visit: Patient presents today for a follow up visit of her radiation pneumonitis. States that her breathing is doing well, no further cough or sob. 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  Had repeat CXR done.  Medication:   Current Outpatient Rx  Name  Route  Sig  Dispense  Refill  . benzonatate (TESSALON) 100 MG capsule   Oral   Take 1 capsule by mouth 3 (three) times daily as needed.       0   . Blood Glucose Monitoring Suppl (ONE TOUCH ULTRA 2) W/DEVICE KIT            11     Dispense as written.   . folic acid (FOLVITE) 1 MG tablet   Oral   Take 1 mg by mouth daily.      11   . GLUCAGON EMERGENCY 1 MG injection            0     Dispense as written.   . insulin aspart (NOVOLOG) 100 UNIT/ML FlexPen      Per Sliding scale         . metFORMIN (GLUCOPHAGE) 1000 MG tablet            1   . pravastatin (PRAVACHOL) 40 MG tablet   Oral   Take 40 mg by mouth daily.         . predniSONE (DELTASONE) 20 MG tablet   Oral   Take 3 tablets (60 mg total) by mouth daily with breakfast.   90 tablet   0   . methotrexate (RHEUMATREX) 2.5 MG tablet   Oral   Take 20 mg by mouth every 7 (seven) days. ON HOLD            Review of Systems: Gen:  Denies  fever, sweats, chills HEENT: Denies blurred vision, double vision, ear pain, eye pain, hearing loss, nose bleeds, sore throat Cvc:  No dizziness, chest pain or heaviness Resp:   Admits to: Gi: Denies swallowing difficulty, stomach pain, nausea or vomiting, diarrhea, constipation,  bowel incontinence Gu:  Denies bladder incontinence, burning urine Ext:   No Joint pain, stiffness or swelling Skin: No skin rash, easy bruising or bleeding or hives Endoc:  No polyuria, polydipsia , polyphagia or weight change Other:  All other systems negative  Allergies:  Codeine  Physical Examination:  VS: BP 108/60 mmHg  Pulse 75  Temp(Src) 97.5 F (36.4 C) (Oral)  Ht _0  (1.549 m)  Wt 133 lb (60.328 kg)  BMI 25.14 kg/m2  SpO2 99%  General Appearance: No distress  HEENT: PERRLA, no ptosis, no other lesions noticed Pulmonary:normal breath sounds., diaphragmatic excursion normal.No wheezing, No rales   Cardiovascular:  Normal S1,S2.  No m/r/g.     Abdomen:Exam: Benign, Soft, non-tender, No masses  Skin:   warm, no rashes, no ecchymosis  Extremities: normal, no cyanosis, clubbing, warm with normal capillary  refill.      Rad results: (The following images and results were reviewed by Dr. Stevenson Clinch). CXR 07/05/14 CHEST  2 VIEW  COMPARISON:  05/26/2014.  FINDINGS: Mediastinum hilar structures are normal. Heart size is stable. Pulmonary vascularity normal. Partial resolution of left lung infiltrate. No pleural effusion or pneumothorax. No acute bony abnormality. Prior cervical spine fusion. Degenerative change thoracic spine .   IMPRESSION: Partial resolution diffuse left lung infiltrate/pneumonitis.    Assessment and Plan:73 yo female presenting for follow up of radiation pneumonitis.  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 Treatment for radiation pneumonitis as prolonged steroids starting with high-dose and can last be on 6 months.  Plan: - doing well today, almost complete resolution of LLL consolidation - wean prednisone to the following - 54m (2pills) x 3 weeks (4/28-5/12), then [id number] follow up appointment. - plan for a repeat CT Chest without contrast in July or August of 2016.     Lung consolidation See plan for radiation pneumonitis       Updated Medication List Outpatient Encounter Prescriptions as of 07/14/2014  Medication Sig  . benzonatate (TESSALON) 100 MG capsule Take 1 capsule by mouth 3 (three) times daily as needed.  . Blood Glucose Monitoring Suppl (ONE TOUCH ULTRA 2) W/DEVICE KIT   . folic acid (FOLVITE) 1 MG tablet Take 1 mg by mouth daily.  . Marland KitchenLUCAGON EMERGENCY 1 MG injection   . insulin aspart (NOVOLOG) 100 UNIT/ML FlexPen Per Sliding scale  . metFORMIN (GLUCOPHAGE) 1000 MG tablet   . pravastatin (PRAVACHOL) 40 MG tablet Take 40 mg by mouth daily.  . predniSONE (DELTASONE) 20 MG tablet Take 3 tablets (60 mg total) by mouth daily with breakfast.  . methotrexate  (RHEUMATREX) 2.5 MG tablet Take 20 mg by mouth every 7 (seven) days. ON HOLD  . [DISCONTINUED] predniSONE (STERAPRED UNI-PAK) 10 MG tablet Take 20 tablets by mouth as directed.    Orders for this visit: Orders Placed This Encounter  Procedures  . CT Chest Wo Contrast    Standing Status: Future     Number of Occurrences:      Standing Expiration Date: 09/13/2015    Order Specific Question:  Reason for Exam (SYMPTOM  OR DIAGNOSIS REQUIRED)    Answer:  f/u pneumonitis    Order Specific Question:  Preferred imaging location?    Answer:  [facility]  Thank  you for the visitation and for allowing  [facility] to assist in the  care of your patient. Our recommendations are noted above.  Please contact us if we can be of further service.  Vilinda Boehringer, MD  Pulmonary and Critical Care Office Number: [phone]

## 2014-07-14 NOTE — Assessment & Plan Note (Signed)
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 Treatment for radiation pneumonitis as prolonged steroids starting with high-dose and can last be on 6 months.  Plan: - doing well today, almost complete resolution of LLL consolidation - wean prednisone to the following - 40mg  (2pills) x 3 weeks (4/28-5/12), then 30mg  until follow up appointment. - plan for a repeat CT Chest without contrast in July or August of 2016.

## 2014-08-03 ENCOUNTER — Ambulatory Visit (INDEPENDENT_AMBULATORY_CARE_PROVIDER_SITE_OTHER): Payer: Medicare Other | Admitting: Pulmonary Disease

## 2014-08-03 ENCOUNTER — Ambulatory Visit: Payer: Self-pay | Admitting: Internal Medicine

## 2014-08-03 ENCOUNTER — Encounter: Payer: Self-pay | Admitting: Pulmonary Disease

## 2014-08-03 VITALS — BP 126/62 | HR 86 | Ht 62.0 in | Wt 130.8 lb

## 2014-08-03 DIAGNOSIS — R058 Other specified cough: Secondary | ICD-10-CM

## 2014-08-03 DIAGNOSIS — R05 Cough: Secondary | ICD-10-CM | POA: Diagnosis not present

## 2014-08-03 NOTE — Progress Notes (Signed)
Chief Complaint  Patient presents with  . Acute Visit    VM pt - Pt c/o increased cough since decreasing pred dose and possible PND. Pt currently on Pred 30mg  daily. Pt is wanting to know if she will be okay to travel, leaving tonight.     History of Present Illness: Ashley Bray is a 74 y.o. female with cough.    She is followed by Dr. Stevenson Clinch for radiation pneumonitis.  She is going to [place] tomorrow for a funeral.    She has noticed more cough over the past week.  This started after she was working in her yard.  She has post nasal drip and throat clearing.  She brings up clear sputum.  She denies headache, sore throat, fever, hemoptysis, wheezing, dyspnea, or chest pain.  She is on 30 mg prednisone.   Past medical hx >> DM, HTN, HLD, Breast cancer, RA  Past surgical hx, Medications, Allergies, Family hx, Social hx all reviewed.   Physical Exam: Blood pressure 126/62, pulse 86, height 5\' 2"  (1.575 m), weight 130 lb 12.8 oz (59.33 kg), SpO2 98 %. Body mass index is 23.92 kg/(m^2).  General - No distress ENT - No sinus tenderness, no oral exudate, no LAN, clear nasal drainage Cardiac - s1s2 regular, no murmur Chest - No wheeze/rales/dullness Back - No focal tenderness Abd - Soft, non-tender Ext - No edema Neuro - Normal strength Skin - No rashes Psych - normal mood, and behavior   Assessment/Plan:  Upper airway cough syndrome related to post-nasal drip. Plan: - she can try nasal irrigation and nasacort OTC - don't think she need a chest xray or antibiotics at this time  Radiation pneumonitis. Don't think this is the cause of her current symptoms. Plan: - continue prednisone 30 mg daily   Chesley Mires, MD [facility] Pager:  [phone]

## 2014-08-03 NOTE — Patient Instructions (Signed)
Can try nasal irrigation (saline sinus rinse) once daily as need >> can try Neils sinus rinse Can try nasacort two sprays in each nostril daily as needed Call if not feeling better Follow up with Dr. Stevenson Clinch as scheduled previously

## 2014-08-12 ENCOUNTER — Other Ambulatory Visit: Payer: Self-pay | Admitting: *Deleted

## 2014-08-12 ENCOUNTER — Ambulatory Visit
Admission: RE | Admit: 2014-08-12 | Discharge: 2014-08-12 | Disposition: A | Discharge status: Home / Self Care | Payer: Medicare Other | Source: Ambulatory Visit | Attending: Radiation Oncology | Admitting: Radiation Oncology

## 2014-08-12 ENCOUNTER — Encounter: Payer: Self-pay | Admitting: Radiation Oncology

## 2014-08-12 VITALS — BP 152/76 | HR 83 | Temp 97.1°F | Resp 20 | Wt 130.5 lb

## 2014-08-12 DIAGNOSIS — C50912 Malignant neoplasm of unspecified site of left female breast: Secondary | ICD-10-CM

## 2014-08-12 NOTE — Progress Notes (Signed)
Radiation Oncology Follow up Note  Name: Ashley Bray   Date:   08/12/2014 MRN:  [id number] DOB: March 22, 1940    This 74 y.o. female presents to the clinic today for breast cancer follow-up  REFERRING PROVIDER: Madelyn Brunner, MD  HPI: Patient is a 74 year old female now out 6 months having completed radiation therapy to her left breast for ductal carcinoma in situ.. Patient developed what was presumed radiation pneumonitis with a wedge shaped area of consolidation in the region of our prior radiation although our fields only skimmed less than 1 cm of the superficial surface of the lung. We presents for a tumor board I attribute some of this to methotrexate she was on for arthritis which was discontinued. He has been treated as radiation pneumonitis with high-dose steroid-induced and it is clearing nicely by serial chest x-rays. Follow-up mammograms of also been fine. She specifically denies cough marked shortness of breath or dyspnea on exertion. Patient was ER/PR positive has been taken off tamoxifen based on increased joint pain.  COMPLICATIONS OF TREATMENT: present  presumed radiation pneumonitis  FOLLOW UP COMPLIANCE: keeps appointments   PHYSICAL EXAM:  BP 152/76 mmHg  Pulse 83  Temp(Src) 97.1 F (36.2 C)  Resp 20  Wt 130 lb 8.2 oz (59.2 kg) Well-developed female in NAD.Lungs are clear to A&P cardiac examination essentially unremarkable with regular rate and rhythm. No dominant mass or nodularity is noted in either breast in 2 positions examined. Incision is well-healed. No axillary or supraclavicular adenopathy is appreciated. Cosmetic result is excellent. Lungs are clear to A&P. Well-developed well-nourished patient in NAD. HEENT reveals PERLA, EOMI, discs not visualized.  Oral cavity is clear. No oral mucosal lesions are identified. Neck is clear without evidence of cervical or supraclavicular adenopathy. Lungs are clear to A&P. Cardiac examination is essentially unremarkable with  regular rate and rhythm without murmur rub or thrill. Abdomen is benign with no organomegaly or masses noted. Motor sensory and DTR levels are equal and symmetric in the upper and lower extremities. Cranial nerves II through XII are grossly intact. Proprioception is intact. No peripheral adenopathy or edema is identified. No motor or sensory levels are noted. Crude visual fields are within normal range.   RADIOLOGY RESULTS: Mammograms have been ordered in May.  PLAN: At the present time she is doing well currently pneumonitis is resolving. I did discuss with her my believe that this was secondary to methotrexate in May been triggered by superficial exposure of lung to radiation field. She continues to do well and is scheduled for mammograms in May. I will review those when it becomes available. I've otherwise asked to see her back in 6 months for follow-up. They continue holding her aromatase inhibitor therapy based on her joint pain and other side effects. Patient is to call sooner with any concerns.  I would like to take this opportunity for allowing me to participate in the care of your patient.Armstead Peaks., MD

## 2014-08-17 ENCOUNTER — Other Ambulatory Visit: Payer: Self-pay | Admitting: Internal Medicine

## 2014-08-17 ENCOUNTER — Ambulatory Visit
Admission: RE | Admit: 2014-08-17 | Discharge: 2014-08-17 | Disposition: A | Discharge status: Home / Self Care | Payer: Medicare Other | Source: Ambulatory Visit | Attending: Internal Medicine | Admitting: Internal Medicine

## 2014-08-17 ENCOUNTER — Ambulatory Visit: Payer: Self-pay

## 2014-08-17 DIAGNOSIS — Z853 Personal history of malignant neoplasm of breast: Secondary | ICD-10-CM

## 2014-08-17 DIAGNOSIS — Z9889 Other specified postprocedural states: Secondary | ICD-10-CM | POA: Insufficient documentation

## 2014-08-24 ENCOUNTER — Ambulatory Visit (INDEPENDENT_AMBULATORY_CARE_PROVIDER_SITE_OTHER): Payer: Medicare Other | Admitting: Internal Medicine

## 2014-08-24 ENCOUNTER — Encounter: Payer: Self-pay | Admitting: Internal Medicine

## 2014-08-24 VITALS — BP 118/60 | HR 78 | Temp 98.0°F | Ht 62.0 in | Wt 130.0 lb

## 2014-08-24 DIAGNOSIS — J700 Acute pulmonary manifestations due to radiation: Secondary | ICD-10-CM | POA: Diagnosis not present

## 2014-08-24 NOTE — Assessment & Plan Note (Signed)
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 Treatment for radiation pneumonitis as prolonged steroids starting with high-dose and can last be on 6 months. Currently doing well, will plan to wean and get a follow up CXR just prior to her follow up visit. New regiment - Prednisone 20mg  daily with breakfast, until follow up visit in 6 wks.    Plan: - doing well today, almost complete resolution of LLL consolidation based on last study - Prednisone 20mg  until follow up appointment. - plan for repeat cxr, 2 view, prior to follow up visit.

## 2014-08-24 NOTE — Progress Notes (Signed)
MRN# [id number] Ashley Bray Jun 13, 1940   CC: Chief Complaint  Patient presents with  . Follow-up    Pt saw Dr. Halford Chessman on 08/03/14 she had a cough and was leaving town. She is still on 51m Prednisone.She reports that the cough is coming from sinus drainage. She says her mid-day blood sugars are over 200 daily. She has a new problem left foot drop (Dr. KJefm Bryant is planning NCV and MRI.      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 Bronchoscopy with BAL showed no atypical organisms and all cultures. 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   06/10/14 ROV: Patient presents today for a followup visit. She was last seen for suspected left upper lobe consolidation and groundglass opacities thought to be possible pneumonia, atypical infection, radiation pneumonitis. She had bronchoscopy performed on 05/26/2014 which he tolerated well. All cultures from that current bronchoscopy currently negative. Patient followup with her oncologist on 06/08/2014 and was told that she restarted on steroids for suspected radiation pneumonitis. At that time she was started on 50 mg of prednisone and advised to followup with pulmonary for further recommendations to treat her left upper lobe abnormality.  Today patient states that she still has low energy, but she is managing okay, but she still endorses a dry cough, it is intermittent at times. 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 States that her breathing is doing well, no further cough or sob. 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  Had repeat CXR done. Plan: Prednisone 452mfor 3 weeks, then  3010mntil follow up   Events since last clinic visit:  She presents today for follow-up visit of her radiation pneumonitis. Since her last visit she had one acute care visit I'll about pulmonary [place], she was seen and evaluated for cough and nasal congestion, diagnosed with upper airway cough syndrome and advised to continue with supportive care. She states that today she is doing overall well, she does not endorse any nasal congestion, or cough. States that she is very to wean down prednisone more if possible today. States that she saw her primary care physician, has a history of left foot drop and will be following with neurology. Overall states that her breathing is improving, and she feels she is back to her baseline.   Medication:   Current Outpatient Rx  Name  Route  Sig  Dispense  Refill  . benzonatate (TESSALON) 100 MG capsule   Oral   Take 1 capsule by mouth 3 (three) times daily as needed.      0   . Blood Glucose Monitoring Suppl (ONE TOUCH ULTRA 2) W/DEVICE KIT            11     Dispense as written.   . folic acid (FOLVITE) 1 MG tablet   Oral   Take 1 mg by mouth daily.      11   . GLUCAGON EMERGENCY 1 MG injection            0     Dispense as written.   . insulin aspart (NOVOLOG) 100 UNIT/ML FlexPen      Per Sliding scale         .  metFORMIN (GLUCOPHAGE) 1000 MG tablet   Oral   Take 1,000 mg by mouth 2 (two) times daily with a meal.       1   . methotrexate (RHEUMATREX) 2.5 MG tablet   Oral   Take 20 mg by mouth every 7 (seven) days. ON HOLD         . pravastatin (PRAVACHOL) 40 MG tablet   Oral   Take 40 mg by mouth daily.         . predniSONE (DELTASONE) 20 MG tablet   Oral   Take 3 tablets (60 mg total) by mouth daily with breakfast. Patient taking differently: Take 30 mg by mouth daily with breakfast.    90 tablet   0      Review of Systems: Gen:  Denies  fever, sweats, chills HEENT: Denies blurred vision, double  vision, ear pain, eye pain, hearing loss, nose bleeds, sore throat Cvc:  No dizziness, chest pain or heaviness Resp:   Admits to: Gi: Denies swallowing difficulty, stomach pain, nausea or vomiting, diarrhea, constipation, bowel incontinence Gu:  Denies bladder incontinence, burning urine Ext:   No Joint pain, stiffness or swelling Skin: No skin rash, easy bruising or bleeding or hives Endoc:  No polyuria, polydipsia , polyphagia or weight change Other:  All other systems negative  Allergies:  Codeine  Physical Examination:  VS: BP 118/60 mmHg  Pulse 78  Temp(Src) 98 F (36.7 C) (Oral)  Ht '5\' 2"'  (1.575 m)  Wt 130 lb (58.968 kg)  BMI 23.77 kg/m2  SpO2 98%  General Appearance: No distress  HEENT: PERRLA, no ptosis, no other lesions noticed Pulmonary:normal breath sounds., diaphragmatic excursion normal.No wheezing, No rales   Cardiovascular:  Normal S1,S2.  No m/r/g.     Abdomen:Exam: Benign, Soft, non-tender, No masses  Skin:   warm, no rashes, no ecchymosis  Extremities: normal, no cyanosis, clubbing, warm with normal capillary refill.       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 Treatment for radiation pneumonitis as prolonged steroids starting with high-dose and can last be on 6 months. Currently doing well, will plan to wean and get a follow up CXR just prior to her follow up visit. New regiment - Prednisone 54m daily with breakfast, until follow up visit in 6 wks.    Plan: - doing well today, almost complete resolution of LLL consolidation based on last study - Prednisone 243muntil follow up appointment. - plan for repeat cxr, 2 view, prior to follow up  visit.         Updated Medication List Outpatient Encounter Prescriptions as of 08/24/2014  Medication Sig  . benzonatate (TESSALON) 100 MG capsule Take 1 capsule by mouth 3 (three) times daily as needed.  . Blood Glucose Monitoring Suppl (ONE TOUCH ULTRA 2) W/DEVICE KIT   . folic acid (FOLVITE) 1 MG tablet Take 1 mg by mouth daily.  . Marland KitchenLUCAGON EMERGENCY 1 MG injection   . insulin aspart (NOVOLOG) 100 UNIT/ML FlexPen Per Sliding scale  . metFORMIN (GLUCOPHAGE) 1000 MG tablet Take 1,000 mg by mouth 2 (two) times daily with a meal.   . methotrexate (RHEUMATREX) 2.5 MG tablet Take 20 mg by mouth every 7 (seven) days. ON HOLD  . pravastatin (  PRAVACHOL) 40 MG tablet Take 40 mg by mouth daily.  . predniSONE (DELTASONE) 20 MG tablet Take 3 tablets (60 mg total) by mouth daily with breakfast. (Patient taking differently: Take 30 mg by mouth daily with breakfast. )   No facility-administered encounter medications on file as of 08/24/2014.    Orders for this visit: Orders Placed This Encounter  Procedures  . DG Chest 2 View    Standing Status: Future     Number of Occurrences:      Standing Expiration Date: 10/24/2015    Order Specific Question:  Reason for Exam (SYMPTOM  OR DIAGNOSIS REQUIRED)    Answer:  radiation pneumonitis    Order Specific Question:  Preferred imaging location?    Answer:  [facility]    Thank  you for the visitation and for allowing  [facility] to assist in the care of your patient. Our recommendations are noted above.  Please contact us if we can be of further service.  Vilinda Boehringer, MD [facility] Office Number: [phone]

## 2014-08-24 NOTE — Patient Instructions (Signed)
Follow up with Dr. Stevenson Clinch in 6 weeks - Will wean your prednisone to the following - 20mg  until follow up appointment. Please try to take with breakfast - please monitor your weight closely and blood glucose levels - we will plan for a repeat CXR (2-view) just prior to your follow up visit.

## 2014-08-25 ENCOUNTER — Other Ambulatory Visit: Payer: Self-pay | Admitting: Rheumatology

## 2014-08-25 DIAGNOSIS — M21372 Foot drop, left foot: Secondary | ICD-10-CM

## 2014-09-01 ENCOUNTER — Ambulatory Visit
Admission: RE | Admit: 2014-09-01 | Discharge: 2014-09-01 | Disposition: A | Discharge status: Home / Self Care | Payer: Medicare Other | Source: Ambulatory Visit | Attending: Rheumatology | Admitting: Rheumatology

## 2014-09-01 DIAGNOSIS — M21372 Foot drop, left foot: Secondary | ICD-10-CM | POA: Insufficient documentation

## 2014-09-01 DIAGNOSIS — M5416 Radiculopathy, lumbar region: Secondary | ICD-10-CM | POA: Insufficient documentation

## 2014-09-02 ENCOUNTER — Other Ambulatory Visit: Payer: Self-pay | Admitting: *Deleted

## 2014-09-02 DIAGNOSIS — J700 Acute pulmonary manifestations due to radiation: Secondary | ICD-10-CM

## 2014-09-15 ENCOUNTER — Telehealth: Payer: Self-pay | Admitting: Internal Medicine

## 2014-09-15 NOTE — Telephone Encounter (Signed)
Called spoke with pt. She is already scheduled for an acute visit with Dr. Stevenson Clinch tomorrow. She will ask him then regarding CXR .

## 2014-09-16 ENCOUNTER — Encounter: Payer: Self-pay | Admitting: Internal Medicine

## 2014-09-16 ENCOUNTER — Ambulatory Visit (INDEPENDENT_AMBULATORY_CARE_PROVIDER_SITE_OTHER): Payer: Medicare Other | Admitting: Internal Medicine

## 2014-09-16 VITALS — BP 132/60 | HR 78 | Ht 62.0 in | Wt 132.0 lb

## 2014-09-16 DIAGNOSIS — R058 Other specified cough: Secondary | ICD-10-CM

## 2014-09-16 DIAGNOSIS — J700 Acute pulmonary manifestations due to radiation: Secondary | ICD-10-CM

## 2014-09-16 DIAGNOSIS — R059 Cough, unspecified: Secondary | ICD-10-CM

## 2014-09-16 DIAGNOSIS — R05 Cough: Secondary | ICD-10-CM | POA: Diagnosis not present

## 2014-09-16 MED ORDER — MONTELUKAST SODIUM 10 MG PO TABS: 10.0000 mg | ORAL_TABLET | Freq: Every day | ORAL | Status: DC

## 2014-09-16 NOTE — Progress Notes (Signed)
MRN# [id number] Ashley Bray 07/23/1940   CC: Chief Complaint  Patient presents with  . Follow-up    Pt c/o chest tightness, sl. cough. She is having a hard time swallowing pills.      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 Bronchoscopy with BAL showed no atypical organisms and all cultures. 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   06/10/14 ROV: Patient presents today for a followup visit. She was last seen for suspected left upper lobe consolidation and groundglass opacities thought to be possible pneumonia, atypical infection, radiation pneumonitis. She had bronchoscopy performed on 05/26/2014 which he tolerated well. All cultures from that current bronchoscopy currently negative. Patient followup with her oncologist on 06/08/2014 and was told that she restarted on steroids for suspected radiation pneumonitis. At that time she was started on 50 mg of prednisone and advised to followup with pulmonary for further recommendations to treat her left upper lobe abnormality.  Today patient states that she still has low energy, but she is managing okay, but she still endorses a dry cough, it is intermittent at times. 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 States that her breathing is doing well, no further cough or sob. 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  Had repeat CXR done. Plan: Prednisone 13m for 3 weeks, then 359muntil follow up   ROV 08/24/14 She presents today for follow-up visit of her radiation pneumonitis. Since her last visit she had one acute care visit I'll about pulmonary [place], she was seen and  evaluated for cough and nasal congestion, diagnosed with upper airway cough syndrome and advised to continue with supportive care. She states that today she is doing overall well, she does not endorse any nasal congestion, or cough. States that she is very to wean down prednisone more if possible today. States that she saw her primary care physician, has a history of left foot drop and will be following with neurology. Overall states that her breathing is improving, and she feels she is back to her baseline.   Events since last clinic visit:   She presents today for an acute visit, she is accompanied by her husband. Patient states that she's been having sinus drainage, which is clear, and nasal congestion for the past couple of days. She has been using nasal steroids intermittently and netti pot with good relief. A few weeks ago she had similar symptoms and was diagnosed with postnasal drip and upper airway cough syndrome. Today she denies any worsening shortness of breath, any worsening cough, no fever, no productive sputum. Overall patient states that she is doing well, however she has noticed that she's had some more hair loss since being on steroids.   Medication:   Current Outpatient Rx  Name  Route  Sig  Dispense  Refill  . benzonatate (TESSALON) 100 MG capsule   Oral   Take 1 capsule by mouth 3 (three) times daily as needed.      0   . Blood Glucose Monitoring Suppl (ONE TOUCH ULTRA 2) W/DEVICE KIT            11     Dispense as written.   . folic acid (FOLVITE) 1 MG  tablet   Oral   Take 1 mg by mouth daily.      11   . GLUCAGON EMERGENCY 1 MG injection            0     Dispense as written.   . insulin aspart (NOVOLOG) 100 UNIT/ML FlexPen      Per Sliding scale         . metFORMIN (GLUCOPHAGE) 1000 MG tablet   Oral   Take 1,000 mg by mouth 2 (two) times daily with a meal.       1   . methotrexate (RHEUMATREX) 2.5 MG tablet   Oral   Take 20 mg by  mouth every 7 (seven) days. ON HOLD         . pravastatin (PRAVACHOL) 40 MG tablet   Oral   Take 40 mg by mouth daily.         . predniSONE (DELTASONE) 20 MG tablet   Oral   Take 3 tablets (60 mg total) by mouth daily with breakfast. Patient taking differently: Take 20 mg by mouth daily with breakfast.    90 tablet   0      Review of Systems: Gen:  Denies  fever, sweats, chills HEENT: Denies blurred vision, double vision, ear pain, eye pain, hearing loss, nose bleeds, sore throat.  Mild clear sinus drainage Cvc:  No dizziness, chest pain or heaviness Resp:   Admits to: Mild cough and sinus drainage Gi: Denies swallowing difficulty, stomach pain, nausea or vomiting, diarrhea, constipation, bowel incontinence Gu:  Denies bladder incontinence, burning urine Ext:   No Joint pain, stiffness or swelling Skin: No skin rash, easy bruising or bleeding or hives Endoc:  No polyuria, polydipsia , polyphagia or weight change Other:  All other systems negative  Allergies:  Codeine  Physical Examination:  VS: BP 132/60 mmHg  Pulse 78  Ht '5\' 2"'  (1.575 m)  Wt 132 lb (59.875 kg)  BMI 24.14 kg/m2  SpO2 99%  General Appearance: No distress  HEENT: PERRLA, no ptosis, no other lesions noticed Pulmonary:normal breath sounds., diaphragmatic excursion normal.No wheezing, No rales   Cardiovascular:  Normal S1,S2.  No m/r/g.     Abdomen:Exam: Benign, Soft, non-tender, No masses  Skin:   warm, no rashes, no ecchymosis  Extremities: normal, no cyanosis, clubbing, warm with normal capillary refill.       Assessment and Plan: 74 year old female seen in follow-up visit today for sinus drainage and radiation pneumonitis Upper airway cough syndrome Upper airway cough syndrome related to post-nasal drip.  Plan: - continue with nasal irrigation and nasacort OTC - start singulair 64m daily for the summer - don't think she need a chest xray or antibiotics at this time  Radiation  pneumonitis Given the temporal relationship to the patient's symptoms and radiographic abnormalities along with negative cultures from bronchoscopy, I believe the patient does have a case of radiation pneumonitis. Treatment for radiation pneumonitis as prolonged steroids starting with high-dose and can last be on 6 months. Currently doing well, will plan to wean and get a follow up CXR just prior to her follow up visit. Starting to have hair loss with prednisone, plan to decrease steroid dosing today, which patient is in agreement.  New regiment - Prednisone 153mdaily with breakfast, until follow up visit in 6 wks.    Plan: - doing well today, almost complete resolution of LLL consolidation based on last study - Prednisone 1561mntil follow up appointment, if symptoms return,  then go back to 59m - plan for repeat cxr, 2 view, prior to follow up visit.        Cough Secondary to radiation pneumonitis and UACS       Updated Medication List Outpatient Encounter Prescriptions as of 09/16/2014  Medication Sig  . benzonatate (TESSALON) 100 MG capsule Take 1 capsule by mouth 3 (three) times daily as needed.  . Blood Glucose Monitoring Suppl (ONE TOUCH ULTRA 2) W/DEVICE KIT   . folic acid (FOLVITE) 1 MG tablet Take 1 mg by mouth daily.  .Marland KitchenGLUCAGON EMERGENCY 1 MG injection   . insulin aspart (NOVOLOG) 100 UNIT/ML FlexPen Per Sliding scale  . metFORMIN (GLUCOPHAGE) 1000 MG tablet Take 1,000 mg by mouth 2 (two) times daily with a meal.   . methotrexate (RHEUMATREX) 2.5 MG tablet Take 20 mg by mouth every 7 (seven) days. ON HOLD  . pravastatin (PRAVACHOL) 40 MG tablet Take 40 mg by mouth daily.  . predniSONE (DELTASONE) 20 MG tablet Take 3 tablets (60 mg total) by mouth daily with breakfast. (Patient taking differently: Take 20 mg by mouth daily with breakfast. )  . montelukast (SINGULAIR) 10 MG tablet Take 1 tablet (10 mg total) by mouth at bedtime.   No facility-administered encounter  medications on file as of 09/16/2014.    Orders for this visit: No orders of the defined types were placed in this encounter.    Thank  you for the visitation and for allowing  [facility] to assist in the care of your patient. Our recommendations are noted above.  Please contact [place] we can be of further service.  VVilinda Boehringer MD [facility] Office Number: [phone]

## 2014-09-16 NOTE — Assessment & Plan Note (Signed)
Upper airway cough syndrome related to post-nasal drip.  Plan: - continue with nasal irrigation and nasacort OTC - start singulair 10mg  daily for the summer - don't think she need a chest xray or antibiotics at this time

## 2014-09-16 NOTE — Patient Instructions (Addendum)
Keep follow up appointment with Dr. Stevenson Clinch in July 2016 - start singulair 10mg  daily for the summer - cont with nasocort as needed and netti pot.  - starting July 7th, start 15mg  prednisone daily (3 pills).  If symptoms return, then go back to 20mg  daily until follow up.  - please inform your endocrinologist of the steroid adjustment - there is already an order for CXR prior to your follow up visit, please have this done.

## 2014-09-16 NOTE — Assessment & Plan Note (Signed)
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 Treatment for radiation pneumonitis as prolonged steroids starting with high-dose and can last be on 6 months. Currently doing well, will plan to wean and get a follow up CXR just prior to her follow up visit. Starting to have hair loss with prednisone, plan to decrease steroid dosing today, which patient is in agreement.  New regiment - Prednisone 15mg  daily with breakfast, until follow up visit in 6 wks.    Plan: - doing well today, almost complete resolution of LLL consolidation based on last study - Prednisone 15mg  until follow up appointment, if symptoms return, then go back to 20mg  - plan for repeat cxr, 2 view, prior to follow up visit.

## 2014-09-16 NOTE — Assessment & Plan Note (Signed)
Secondary to radiation pneumonitis and UACS

## 2014-09-21 ENCOUNTER — Encounter: Payer: Medicare Other | Attending: Internal Medicine | Admitting: Dietician

## 2014-09-21 ENCOUNTER — Encounter: Payer: Self-pay | Admitting: Dietician

## 2014-09-21 ENCOUNTER — Telehealth: Payer: Self-pay | Admitting: Internal Medicine

## 2014-09-21 VITALS — Ht 62.0 in | Wt 132.3 lb

## 2014-09-21 DIAGNOSIS — E119 Type 2 diabetes mellitus without complications: Secondary | ICD-10-CM

## 2014-09-21 MED ORDER — PREDNISONE 5 MG PO TABS: ORAL_TABLET | ORAL | Status: DC

## 2014-09-21 NOTE — Telephone Encounter (Signed)
Spoke with pt. Sending refill of Prednisone 5mg  to pt's pharmacy. Nothing further needed.

## 2014-09-21 NOTE — Progress Notes (Signed)
Medical Nutrition Therapy: Visit start time: 14:30 end time: 15;30pm Assessment:  Diagnosis: diabetes; elevated glucose related to prednisone Past medical history: hypertension, high cholesterol, hx of breast cancer Psychosocial issues/ stress concerns: rates her stress level as high but indicates she is dealing "very well" with her stress.  Current weight: 132.3  Height: 62 in Medications, supplements: see list Progress and evaluation: Pt. Referred due to elevated glucose related to prednisone. She reports as prednisone has been lowered her FBG's have improved and are typically in the 80's. Her pre-lunch readings have improved from 200's to 130's and pre-dinner  are in 130's to 150's. She has made conscious effort to control her weight while taking Prednisone and has been able to do so.  She has limited carbohydrate and is ready to work more healthy carbohydrates into her diet and wants some guidance to do so. Physical activity: 5 days/week - cardio for 40 minutes + CARE exercise program for 1 hour, 2 days/week.  Dietary Intake:  Usual eating pattern includes 3  meals and 2-3 snacks per day. Dining out frequency: 2-3 meals per week.  Breakfast: oatmeal with almond milk and a few blueberries or high fiber bread with peanut butter and 1/2 banana Lunch: 1/2 meat/chees sandwich with lettuce, tomato, fruit or chef salad, water or seltzer water Snack: peanuts, or almonds or blueberries Supper: roast, butter beans, green beans. Usually eats fish or chicken with vegetables. Snack: cheese stick or 1-2 squares of cheese  Nutrition Care Education: Diabetes:  Instructed on a meal plan for diabetes based on 1300 calories including review of carbohydrate counting and balance of carbohydrate, protein, fat and non-starchy vegetables. Reviewed label reading. Discussed need for adequate healthy carbohydrate choices and protein to meet nutrient needs.  Nutritional Diagnosis:  Pt. was possibly over-restricting  carbohydrate in effort to control blood sugars but has begun to more appropriately add back into diet. Intervention: Pt. To spread 8-9 servings of carbohydrate over 3 meals and 2 snacks with no more than 3 servings per meal. Balance meals with protein, 2 servings of carbohydrate and non-starchy vegetables. Include at least 6 oz. of a protein equivalent  daily to ensure adequate protein. Education Materials given:  . Food lists/ Planning A Balanced Meal . Sample meal pattern/ menus . Goals/ instructions  Learner/ who was taught:  . Patient   Level of understanding: Marland Kitchen Verbalizes/ demonstrates competency Learning barriers: . None  Willingness to learn/ readiness for change: . Eager, change in progress  Monitoring and Evaluation:  No follow-up scheduled. Patient was encouraged to call if desires further help regarding her diet/nutrtion.

## 2014-09-21 NOTE — Patient Instructions (Signed)
Pt. To spread 8-9 servings of carbohydrate over 3 meals and 2 snacks with no more than 3 servings per meal. Balance meals with protein, 2 servings of carbohydrate and non-starchy vegetables. Include at least 6 oz. of a protein food daily.

## 2014-09-28 DIAGNOSIS — G5793 Unspecified mononeuropathy of bilateral lower limbs: Secondary | ICD-10-CM | POA: Insufficient documentation

## 2014-10-11 ENCOUNTER — Ambulatory Visit
Admission: RE | Admit: 2014-10-11 | Discharge: 2014-10-11 | Disposition: A | Discharge status: Home / Self Care | Payer: Medicare Other | Source: Ambulatory Visit | Attending: Internal Medicine | Admitting: Internal Medicine

## 2014-10-11 ENCOUNTER — Telehealth: Payer: Self-pay | Admitting: *Deleted

## 2014-10-11 DIAGNOSIS — J700 Acute pulmonary manifestations due to radiation: Secondary | ICD-10-CM | POA: Diagnosis present

## 2014-10-11 NOTE — Telephone Encounter (Signed)
Left detailed message as instructed below.

## 2014-10-11 NOTE — Telephone Encounter (Signed)
-----   Message from Vilinda Boehringer, MD sent at 10/11/2014  9:38 AM EDT ----- Regarding: xray results Please inform patient that her xray is clear, we do not see any abnormalities on the left side anymore.  She will still continue with her steroids as discussed in her previous visit.  I will see her in follow up.  Thank you ----- Message -----    From: Rad Results In Interface    Sent: 10/11/2014   8:58 AM      To: Vilinda Boehringer, MD

## 2014-10-12 ENCOUNTER — Ambulatory Visit: Payer: Self-pay | Admitting: Internal Medicine

## 2014-10-13 ENCOUNTER — Ambulatory Visit (INDEPENDENT_AMBULATORY_CARE_PROVIDER_SITE_OTHER): Payer: Medicare Other | Admitting: Internal Medicine

## 2014-10-13 ENCOUNTER — Encounter: Payer: Self-pay | Admitting: Internal Medicine

## 2014-10-13 VITALS — BP 108/60 | HR 88 | Temp 98.0°F | Ht 62.0 in | Wt 128.0 lb

## 2014-10-13 DIAGNOSIS — J700 Acute pulmonary manifestations due to radiation: Secondary | ICD-10-CM | POA: Diagnosis not present

## 2014-10-13 MED ORDER — PREDNISONE 10 MG PO TABS: 10.0000 mg | ORAL_TABLET | Freq: Every day | ORAL | Status: DC

## 2014-10-13 NOTE — Addendum Note (Signed)
Addended by: Devona Konig on: 10/13/2014 09:50 AM   Modules accepted: Orders

## 2014-10-13 NOTE — Patient Instructions (Signed)
Follow up with Dr. Stevenson Clinch in 4-5 weeks - starting tomorrow morning you will start Prednisone 10mg  daily, until follow up with Dr. Stevenson Clinch - cont with monitoring your diabetes levels - cont with diet and exercise.

## 2014-10-13 NOTE — Progress Notes (Signed)
MRN# [id number] Ashley Bray Feb 20, 1941   CC: Chief Complaint  Patient presents with  . Follow-up    Pt saw Endo yesterday and started 2000 mg of Vit B-12. Pt denies cough, or sob. Pt had a fall and bruised rt breast.      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 Bronchoscopy with BAL showed no atypical organisms and all cultures. 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   06/10/14 ROV: Patient presents today for a followup visit. She was last seen for suspected left upper lobe consolidation and groundglass opacities thought to be possible pneumonia, atypical infection, radiation pneumonitis. She had bronchoscopy performed on 05/26/2014 which he tolerated well. All cultures from that current bronchoscopy currently negative. Patient followup with her oncologist on 06/08/2014 and was told that she restarted on steroids for suspected radiation pneumonitis. At that time she was started on 50 mg of prednisone and advised to followup with pulmonary for further recommendations to treat her left upper lobe abnormality.  Today patient states that she still has low energy, but she is managing okay, but she still endorses a dry cough, it is intermittent at times. 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 States that her breathing is doing well, no further cough or sob. 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  Had repeat CXR done. Plan: Prednisone 69m for 3 weeks, then 336muntil follow up   ROV 08/24/14 She presents today for follow-up visit of her radiation pneumonitis. Since her last visit she had one acute care visit I'll about  pulmonary [place], she was seen and evaluated for cough and nasal congestion, diagnosed with upper airway cough syndrome and advised to continue with supportive care. She states that today she is doing overall well, she does not endorse any nasal congestion, or cough. States that she is very to wean down prednisone more if possible today. States that she saw her primary care physician, has a history of left foot drop and will be following with neurology. Overall states that her breathing is improving, and she feels she is back to her baseline.   ROV 09/16/14:  She presents today for an acute visit, she is accompanied by her husband. Patient states that she's been having sinus drainage, which is clear, and nasal congestion for the past couple of days. She has been using nasal steroids intermittently and netti pot with good relief. A few weeks ago she had similar symptoms and was diagnosed with postnasal drip and upper airway cough syndrome. Today she denies any worsening shortness of breath, any worsening cough, no fever, no productive sputum. Overall patient states that she is doing well, however she has noticed that she's had some more hair loss since being on steroids. Plan: - doing well today, almost complete resolution of LLL consolidation based on last study - Prednisone 1523mntil follow up appointment, if symptoms return, then go back to 26m58mplan for repeat cxr, 2 view, prior to follow up visit.  Events since last clinic visit: Patient presents today for a follow-up of her previous treatment for radiation pneumonitis. She states since her last visit she is doing well, she had a repeat chest x-ray done recently that showed  complete resolution of the left lower lobe consolidation. Patient recently had a lost and her family, her brother just passed away. Patient states she has a mild non-productive intermittent cough maybe once or twice a week. Overall patient states she has great  critical improvement and is doing well.  Patient stated today that she is not noticing a large amount of hair loss since reduction of prednisone at last visit.   Medication:   Current Outpatient Rx  Name  Route  Sig  Dispense  Refill  . benzonatate (TESSALON) 100 MG capsule   Oral   Take 1 capsule by mouth 3 (three) times daily as needed.      0   . Blood Glucose Monitoring Suppl (ONE TOUCH ULTRA 2) W/DEVICE KIT            11     Dispense as written.   . cyanocobalamin 2000 MCG tablet   Oral   Take 2,000 mcg by mouth daily.         . folic acid (FOLVITE) 1 MG tablet   Oral   Take 1 mg by mouth daily.      11   . GLUCAGON EMERGENCY 1 MG injection            0     Dispense as written.   . insulin aspart (NOVOLOG) 100 UNIT/ML FlexPen      Per Sliding scale         . metFORMIN (GLUCOPHAGE) 1000 MG tablet   Oral   Take 1,000 mg by mouth 2 (two) times daily with a meal.       1   . methotrexate (RHEUMATREX) 2.5 MG tablet   Oral   Take 20 mg by mouth every 7 (seven) days. ON HOLD         . montelukast (SINGULAIR) 10 MG tablet   Oral   Take 1 tablet (10 mg total) by mouth at bedtime.   30 tablet   1   . pravastatin (PRAVACHOL) 40 MG tablet   Oral   Take 40 mg by mouth daily.         . predniSONE (DELTASONE) 5 MG tablet      Take 3 tabs daily with breakfast   90 tablet   0      Review of Systems: Gen:  Denies  fever, sweats, chills HEENT: Denies blurred vision, double vision, ear pain, eye pain, hearing loss, nose bleeds, sore throat Cvc:  No dizziness, chest pain or heaviness Resp:   Admits to: Mild dry cough, intermittent Gi: Denies swallowing difficulty, stomach pain, nausea or vomiting, diarrhea, constipation, bowel incontinence Gu:  Denies bladder incontinence, burning urine Ext:   No Joint pain, stiffness or swelling Skin: No skin rash, easy bruising or bleeding or hives Endoc:  No polyuria, polydipsia , polyphagia or weight  change Other:  All other systems negative  Allergies:  Codeine  Physical Examination:  VS: BP 108/60 mmHg  Pulse 88  Temp(Src) 98 F (36.7 C) (Oral)  Ht '5\' 2"'  (1.575 m)  Wt 128 lb (58.06 kg)  BMI 23.41 kg/m2  SpO2 97%  General Appearance: No distress  HEENT: PERRLA, no ptosis, no other lesions noticed Pulmonary:normal breath sounds., diaphragmatic excursion normal.No wheezing, No rales   Cardiovascular:  Normal S1,S2.  No m/r/g.     Abdomen:Exam: Benign, Soft, non-tender, No masses  Skin:   warm, no rashes, no ecchymosis  Extremities: normal, no cyanosis, clubbing, warm with normal capillary refill.  Rad results: (The following images and results were reviewed by Dr. Stevenson Clinch). CXR 10/11/14 CHEST - 2 VIEW  COMPARISON: 07/05/2014  FINDINGS: Lungs are clear. The left lower lung infiltrates seen previously has resolved. Heart size and mediastinal contours are within normal limits. Tortuous thoracic aorta. No pneumothorax. No effusion. Cervical fixation hardware noted. Minimal spurring in the lower thoracic spine.  IMPRESSION: No acute cardiopulmonary disease.    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 Treatment for radiation pneumonitis as prolonged steroids starting with high-dose and can last be on 6 months. Currently doing well, will plan to wean and get a follow up CXR just prior to her follow up visit. Since reducing steroid at last visit, hair loss has been less, plan to decrease steroid dosing today, which patient is in agreement.  New regiment - Prednisone 66m daily with breakfast, until follow up visit in 4-5 wks   Plan: -  doing well today, now with complete resolution of LLL consolidation based on last study - Prednisone 1100muntil follow up appointment, if symptoms return, then go back to 2053m           Updated Medication List Outpatient Encounter Prescriptions as of 10/13/2014  Medication Sig  . benzonatate (TESSALON) 100 MG capsule Take 1 capsule by mouth 3 (three) times daily as needed.  . Blood Glucose Monitoring Suppl (ONE TOUCH ULTRA 2) W/DEVICE KIT   . cyanocobalamin 2000 MCG tablet Take 2,000 mcg by mouth daily.  . folic acid (FOLVITE) 1 MG tablet Take 1 mg by mouth daily.  . GMarland KitchenUCAGON EMERGENCY 1 MG injection   . insulin aspart (NOVOLOG) 100 UNIT/ML FlexPen Per Sliding scale  . metFORMIN (GLUCOPHAGE) 1000 MG tablet Take 1,000 mg by mouth 2 (two) times daily with a meal.   . methotrexate (RHEUMATREX) 2.5 MG tablet Take 20 mg by mouth every 7 (seven) days. ON HOLD  . montelukast (SINGULAIR) 10 MG tablet Take 1 tablet (10 mg total) by mouth at bedtime.  . pravastatin (PRAVACHOL) 40 MG tablet Take 40 mg by mouth daily.  . predniSONE (DELTASONE) 5 MG tablet Take 3 tabs daily with breakfast  . [DISCONTINUED] predniSONE (DELTASONE) 20 MG tablet Take 3 tablets (60 mg total) by mouth daily with breakfast. (Patient taking differently: Take 15 mg by mouth daily with breakfast. )   No facility-administered encounter medications on file as of 10/13/2014.    Orders for this visit: No orders of the defined types were placed in this encounter.    Thank  you for the visitation and for allowing  [facility] to assist in the care of your patient. Our recommendations are noted above.  Please contact us [place] we can be of further service.  VisVilinda BoehringerD [facility] Office Number: [phone]

## 2014-10-13 NOTE — Assessment & Plan Note (Addendum)
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 Treatment for radiation pneumonitis as prolonged steroids starting with high-dose and can last be on 6 months. Currently doing well, will plan to wean and get a follow up CXR just prior to her follow up visit. Since reducing steroid at last visit, hair loss has been less, plan to decrease steroid dosing today, which patient is in agreement.  New regiment - Prednisone 10mg  daily with breakfast, until follow up visit in 4-5 wks   Plan: - doing well today, now with complete resolution of LLL consolidation based on last study - Prednisone 10mg  until follow up appointment, if symptoms return, then go back to 20mg 

## 2014-10-26 ENCOUNTER — Ambulatory Visit: Payer: Medicare Other | Attending: Rheumatology

## 2014-10-26 DIAGNOSIS — R269 Unspecified abnormalities of gait and mobility: Secondary | ICD-10-CM | POA: Diagnosis present

## 2014-10-26 DIAGNOSIS — R2681 Unsteadiness on feet: Secondary | ICD-10-CM | POA: Insufficient documentation

## 2014-10-26 NOTE — Therapy (Signed)
[place] [facility]IN [facility] SERVICES [place], [place], [address] Phone: [phone]   Fax:  [phone]  Physical Therapy Evaluation  Patient Details  Name: HAWRAA STAMBAUGH MRN: [id number] Date of Birth: 12-22-1940 Referring Provider:  Emmaline Kluver.,*  Encounter Date: 10/26/2014      PT End of Session - 10/26/14 1616    Visit Number 1   Number of Visits 9   Date for PT Re-Evaluation 11/23/14   Authorization Type 1/10 G codes   PT Start Time 0845   PT Stop Time 0950   PT Time Calculation (min) 65 min   Activity Tolerance Patient tolerated treatment well   Behavior During Therapy [facility] for tasks assessed/performed      Past Medical History  Diagnosis Date  . Diabetes   . Hypertension   . HLD (hyperlipidemia)   . RA (rheumatoid arthritis)   . Breast CA     left  . 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 Replacement total knee bilateral      x2  . Rotator cuff repair Right   . Carpal tunnel release Bilateral   . Neck fusion    . Appendectomy    . Abdominal hysterectomy  1991  . Breast lumpectomy    . Breast biopsy Left 08/31/2013    positive    There were no vitals filed for this visit.  Visit Diagnosis:  Unsteadiness on feet - Plan: PT plan of care cert/re-cert  Gait abnormality - Plan: PT plan of care cert/re-cert      Subjective Assessment - 10/26/14 0901    Subjective pt reports she started experiencing her symptoms after sitting for an hour an half on mothers day, where she felt like she was  going to fall and could not pick up her left foot. pt was informed she has left foot drop from her doctor.  pt reports her pain has worsened and is now in her posterior ankle and radiating up her leg to her back. pt reports she had a near fall experience last month where her great toe did not clear the floor and tripped  and her chest on the  dressor.   pt reports her symptoms initally started as cramping sensation which she has started experiencing on her right LE.  pt reports been going to [employer] twice a week and also [employer] and has been doing all the knee exercises and does the bike and has stpped treadmill due to fear of falling.   pt reports she uses her rail in her home to ascend/decend steps.  pt reports her pain is sharp and throbbing with no numbness or tingling but cramping sensation with current pain of 7/10.    Patient Stated Goals want to be pain free in her left leg   Currently in Pain? Yes          Dermatomes: WNL Myotomes: R: WNL; Left: L4/5 and S1 =2+  Knee jerk: L/R +1 Ankle jerk: R=+1, L=absent  MMT: R hip flexion 4-/5, left hip flexion 4-/5 Right/left knee extension: 5/5 Right/left knee flexion: 4+/5 Right ankle: 5/5 Left ankle (dorsiflexion/inversion/eversion): 2+/5   Standing lumbar ROM: WNL, symptoms increased with extension verus flexion Flexion x10 in standing decreased ankle pain  Extension x10 in standing increased LE pain  Double knee to chest x10:  minimal change in LE symptoms  Palpation: inferior pain with grade 2 P/A glides on L3/4  In prone for ~ 56min: decreased left ankle pain and some centralization of symptoms  Mini prone press-ups x10 decreased LE to pain symptoms to 0/10 and centralization of symptoms   Pt was able to resist pressure with left ankle MMT test but not able to achieve full ROM  Pt was able to ambulate with improved left foot clearance after repeated prone press-ups  Therex: Prone press ups: x10 Repeated lumbar extension in standing x10 against table Sitting with lumbar roll, pt reported relief in symptoms  Pt required verbal and tactile for proper technique    LEFS: 51/80                       PT Long Term Goals - 2014-10-27 1626    PT LONG TERM GOAL #1   Title pt will be able ambulate with gait  speed at least 0.8 m/s in order to be full community ambulator   Baseline deferred   Time 4   Period Weeks   Status New   PT LONG TERM GOAL #2   Title pt will be able to ambulate at least 200 ft with pain less than 4/10 in order return to prior level of function    Baseline pt ambulates with 7/10 pain in her left LE   Time 4   Period Weeks   Status New   PT LONG TERM GOAL #3   Title pt will demonstrate at least 3/5 dorsiflexion MMT in left foot in order to improve foot clearance during gait   Baseline pt has 2+/5 dorsiflexoin    Time 4   Period Weeks   Status New               Plan - October 27, 2014 1617    Clinical Impression Statement pt is a 74 year old female with diagnosised left foot drop.  pt presents with decreased left ankle strength (dorsiflexion/inversion/eversion) and with pain in her ankle, lateral knee and low back.  pt ambulates with decreased left foot clearnce, decresaed stance on left, decreased dorsiflexion, decreased cadance, and with complaint of ankle and knee pain.  pt responded well to lumbar extension preferred movements by experiencing centrilization of symptoms, improved left foot clerance, and slight improvement in ankle strength  suggesting possibility of posterior lumbar derangement with extension preferance.  pt would benefit from skilled PT services to improve dificits, improve balance, gait, decrease fall risk, decrease pain and improve strength and posture.    Pt will benefit from skilled therapeutic intervention in order to improve on the following deficits Abnormal gait;Postural dysfunction;Decreased strength;Decreased mobility;Decreased balance;Pain;Decreased activity tolerance   Rehab Potential Good   PT Frequency 2x / week   PT Duration 4 weeks   PT Treatment/Interventions Aquatic Therapy;Biofeedback;Cryotherapy;Electrical Stimulation;Moist Heat;Therapeutic exercise;Therapeutic activities;Functional mobility training;Stair training;Gait  training;Balance training;Neuromuscular re-education;Manual techniques;Traction          G-Codes - 2014/10/27 1811    Functional Assessment Tool Used history, clinicla judgment, LEFS   Functional Limitation Mobility: Walking and moving around   Mobility: Walking and Moving Around Current Status [phone]) At least 60 percent but less than 80 percent impaired, limited or restricted   Mobility: Walking and Moving Around Goal Status [phone]) At least 40 percent but less than 60 percent impaired, limited or restricted       Problem List Patient Active Problem List   Diagnosis Date Noted  .  Upper airway cough syndrome 09/16/2014  . 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 Lung consolidation 05/25/2014  . Cough 05/25/2014   Renford Dills SPT  This entire session was performed under direct supervision and direction of a licensed Chiropractor . I have personally read, edited and approve of the note as written. Gorden Harms. Tortorici, PT, DPT [id number]  Tortorici,Ashley 10/27/2014, 9:45 AM  [place] [facility] SERVICES [address] [place], [place], [address] Phone: [phone]   Fax:  [phone]

## 2014-10-28 ENCOUNTER — Ambulatory Visit: Payer: Medicare Other

## 2014-10-28 DIAGNOSIS — R2681 Unsteadiness on feet: Secondary | ICD-10-CM | POA: Diagnosis not present

## 2014-10-28 DIAGNOSIS — R269 Unspecified abnormalities of gait and mobility: Secondary | ICD-10-CM

## 2014-10-28 NOTE — Therapy (Signed)
[place] MAIN [facility] SERVICES [address] [place], [place], [address] Phone: [phone]   Fax:  [phone]  Physical Therapy Treatment  Patient Details  Name: Ashley Bray MRN: [id number] Date of Birth: 1940/06/25 Referring Provider:  Emmaline Kluver.,*  Encounter Date: 10/28/2014      PT End of Session - 10/28/14 1033    Visit Number 2   Number of Visits 9   Date for PT Re-Evaluation 11/23/14   Authorization Type 2/10 g codes   PT Start Time 0800   PT Stop Time 0850   PT Time Calculation (min) 50 min   Activity Tolerance Patient tolerated treatment well   Behavior During Therapy [facility] for tasks assessed/performed      Past Medical History  Diagnosis Date  . Diabetes   . Hypertension   . HLD (hyperlipidemia)   . RA (rheumatoid arthritis)   . Breast CA     left  . 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 Replacement total knee bilateral      x2  . Rotator cuff repair Right   . Carpal tunnel release Bilateral   . Neck fusion    . Appendectomy    . Abdominal hysterectomy  1991  . Breast lumpectomy    . Breast biopsy Left 08/31/2013    positive    There were no vitals filed for this visit.  Visit Diagnosis:  Unsteadiness on feet  Gait abnormality      Subjective Assessment - 10/28/14 1029    Subjective pt reports she has been doing her repeated lumbar extension exercises at home. she reports the exercises seem to really improve her LLE pain. pt reports 1/10 pain to lateral L knee today.    Patient Stated Goals want to be pain free in her left leg   Currently in Pain? Yes   Pain Score 1    Pain Location --  lateral L knee   Pain Descriptors / Indicators Aching      Manual therapy: CPA mobilizations to lumbar spine grade II-III 3 bouts of 10 oscillations at each level L1-5.  Sacral nutation grade II-III 3x10  oscillations  Manual traction in supine with mob belt 20s on / 10s off x 5 min total Pt reports she felt pain relief during CPA mobilizations.  Pt reports 0/10 pain following therapy. Pt generally hypomobile throughout lumbar spine.   Therex: Repeated lumbar extension in lying (PPU) 2x10 (mini) Diaphragmatic breathing with cues for Transverse abdominis contraction  X 5 min Pt requires mod-max verbal and tactile cues for Transverse abdominis contraction                                   PT Long Term Goals - 10/26/14 1626    PT LONG TERM GOAL #1   Title pt will be able ambulate with gait speed at least 0.8 m/s in order to be full community ambulator   Baseline deferred   Time 4   Period Weeks   Status New   PT LONG TERM GOAL #2   Title pt will be able to ambulate at least 200 ft with pain less than 4/10 in order return to prior level of function    Baseline pt ambulates with 7/10 pain in her  left LE   Time 4   Period Weeks   Status New   PT LONG TERM GOAL #3   Title pt will demonstrate at least 3/5 dorsiflexion MMT in left foot in order to improve foot clearance during gait   Baseline pt has 2+/5 dorsiflexoin    Time 4   Period Weeks   Status New               Plan - 10/28/14 1034    Clinical Impression Statement Pt seems to be responding well to directional preference exercises (extension). pt had more active ROM into ankle DF and eversion today and less LLE pain. pt responded well to manual therapy and manual traction today having little pain even when walking at end of session. initiated core strengthening with diaphragmatic breathing today. pt has difficulty initiating transferse abdominis contraction.    Pt will benefit from skilled therapeutic intervention in order to improve on the following deficits Abnormal gait;Postural dysfunction;Decreased strength;Decreased mobility;Decreased balance;Pain;Decreased activity tolerance   Rehab Potential  Good   PT Frequency 2x / week   PT Duration 4 weeks   PT Treatment/Interventions Aquatic Therapy;Biofeedback;Cryotherapy;Electrical Stimulation;Moist Heat;Therapeutic exercise;Therapeutic activities;Functional mobility training;Stair training;Gait training;Balance training;Neuromuscular re-education;Manual techniques;Traction;Dry needling        Problem List Patient Active Problem List   Diagnosis Date Noted  . Upper airway cough syndrome 09/16/2014  . 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 Lung consolidation 05/25/2014  . Cough 05/25/2014   Gorden Harms. Marcoantonio Legault, PT, DPT [phone]  Keltin Baird 10/28/2014, 10:55 AM  [place] MAIN [facility] SERVICES [address] [place], [place], [address] Phone: [phone]   Fax:  [phone]

## 2014-10-28 NOTE — Patient Instructions (Signed)
[email or web address] Diaphragmatic breathing "treat you own back" Ardelle Lesches book- asked to read first 2 chapters

## 2014-11-02 ENCOUNTER — Ambulatory Visit: Payer: Medicare Other

## 2014-11-02 DIAGNOSIS — R2681 Unsteadiness on feet: Secondary | ICD-10-CM | POA: Diagnosis not present

## 2014-11-02 DIAGNOSIS — R269 Unspecified abnormalities of gait and mobility: Secondary | ICD-10-CM

## 2014-11-03 NOTE — Therapy (Signed)
[place] MAIN [facility] SERVICES [place], [place], [address] Phone: [phone]   Fax:  [phone]  Physical Therapy Treatment  Patient Details  Name: Ashley Bray MRN: [id number] Date of Birth: [birth year] Referring Provider:  Emmaline Kluver.,*  Encounter Date: 11/02/2014      PT End of Session - 11/03/14 1013    Visit Number 3   Number of Visits 9   Date for PT Re-Evaluation 11/23/14   Authorization Type 3/10   PT Start Time 1031   PT Stop Time 1115   PT Time Calculation (min) 44 min   Activity Tolerance Patient tolerated treatment well   Behavior During Therapy [facility] for tasks assessed/performed      Past Medical History  Diagnosis Date  . Diabetes   . Hypertension   . HLD (hyperlipidemia)   . RA (rheumatoid arthritis)   . Breast CA     left  . 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 Replacement total knee bilateral      x2  . Rotator cuff repair Right   . Carpal tunnel release Bilateral   . Neck fusion    . Appendectomy    . Abdominal hysterectomy  1991  . Breast lumpectomy    . Breast biopsy Left 08/31/2013    positive    There were no vitals filed for this visit.  Visit Diagnosis:  Unsteadiness on feet  Gait abnormality      Subjective Assessment - 11/03/14 1012    Subjective pt reports she has been compliant with HEP. she has not noticed a change in her foot drop, but is having much less LLE pain and more centralized LBP. pt reports she has been referred by Dr. Jefm Bryant to a spine specialist.    Patient Stated Goals want to be pain free in her left leg   Currently in Pain? Yes   Pain Score 1    Pain Location --  lower back and lateral knee       Manual therapy: Thoracic and lumbar CPA glides grade III 3 bouts of 30s each level Sacral nutation grade III 2 bouts of 30 s   Therex:     Diaphragmatic breathing (initiating TA contraction) with tactile and verbal cues x 3 min Diaphragmatic breathing (initiating TA contraction) with LE march 2x10 Diaphragmatic breathing (initiating TA contraction) with mini bridge x 5 Diaphragmatic breathing (initiating TA contraction) with prone alt LE extension 2x10 Diaphragmatic breathing (initiating TA contraction) with clamshell x10 each side Pt requires mod-max verbal and tactile cues for proper breathing and pacing                           PT Education - 11/03/14 1013    Education provided Yes   Education Details therex, TA contraction with diaphragmatic breathing    Person(s) Educated Patient   Methods Explanation   Comprehension Verbalized understanding;Returned demonstration             PT Long Term Goals - 10/26/14 1626    PT LONG TERM GOAL #1   Title pt will be able ambulate with gait speed at least 0.8 m/s in order to be full community ambulator   Baseline deferred   Time 4   Period Weeks   Status New   PT  LONG TERM GOAL #2   Title pt will be able to ambulate at least 200 ft with pain less than 4/10 in order return to prior level of function    Baseline pt ambulates with 7/10 pain in her left LE   Time 4   Period Weeks   Status New   PT LONG TERM GOAL #3   Title pt will demonstrate at least 3/5 dorsiflexion MMT in left foot in order to improve foot clearance during gait   Baseline pt has 2+/5 dorsiflexoin    Time 4   Period Weeks   Status New               Plan - 11/03/14 1014    Clinical Impression Statement pt is showing progress regarding centralizing LLE symptoms to the lower back with repeated lumbar extenion based exercises. her foot drop status is still unchanged significantly. progressed core strengthening today with did take quite a bit of verbal/tactile cues. pt reports she felt great after her session with no pain.    Pt will benefit from skilled therapeutic  intervention in order to improve on the following deficits Abnormal gait;Postural dysfunction;Decreased strength;Decreased mobility;Decreased balance;Pain;Decreased activity tolerance   Rehab Potential Good   PT Frequency 2x / week   PT Duration 4 weeks   PT Treatment/Interventions Aquatic Therapy;Biofeedback;Cryotherapy;Electrical Stimulation;Moist Heat;Therapeutic exercise;Therapeutic activities;Functional mobility training;Stair training;Gait training;Balance training;Neuromuscular re-education;Manual techniques;Traction;Dry needling        Problem List Patient Active Problem List   Diagnosis Date Noted  . Upper airway cough syndrome 09/16/2014  . 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 Lung consolidation 05/25/2014  . Cough 05/25/2014   Gorden Harms. Cecelia Graciano, PT, DPT #[id number]]  Ashley Bray 11/03/2014, 10:19 AM  [place] MAIN [facility] SERVICES [address] [place], [place], [address] Phone: [phone]   Fax:  [phone]

## 2014-11-04 ENCOUNTER — Ambulatory Visit: Payer: Medicare Other

## 2014-11-04 DIAGNOSIS — R269 Unspecified abnormalities of gait and mobility: Secondary | ICD-10-CM

## 2014-11-04 DIAGNOSIS — R2681 Unsteadiness on feet: Secondary | ICD-10-CM

## 2014-11-04 NOTE — Therapy (Signed)
[place] MAIN [facility] SERVICES [place], [place], [address] Phone: [phone]   Fax:  [phone]  Physical Therapy Treatment  Patient Details  Name: Ashley Bray MRN: [id number] Date of Birth: 1940-10-24 Referring Provider:  Emmaline Kluver.,*  Encounter Date: 11/04/2014      PT End of Session - 11/04/14 1446    Visit Number 4   Number of Visits 9   Date for PT Re-Evaluation 11/23/14   Authorization Type 4/10   PT Start Time 1350   PT Stop Time 1430   PT Time Calculation (min) 40 min   Activity Tolerance Patient tolerated treatment well   Behavior During Therapy [facility] for tasks assessed/performed      Past Medical History  Diagnosis Date  . Diabetes   . Hypertension   . HLD (hyperlipidemia)   . RA (rheumatoid arthritis)   . Breast CA     left  . 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 Replacement total knee bilateral      x2  . Rotator cuff repair Right   . Carpal tunnel release Bilateral   . Neck fusion    . Appendectomy    . Abdominal hysterectomy  1991  . Breast lumpectomy    . Breast biopsy Left 08/31/2013    positive    There were no vitals filed for this visit.  Visit Diagnosis:  Unsteadiness on feet  Abnormality of gait      Subjective Assessment - 11/04/14 1440    Subjective pt reports her left LE pain woke her up around 2am in the morning and relates pain medication helps decrease the pain.  pt relats doing the repeated lumbar extension against bathroom countertop also helps decrease pain.  pt reports she also had increased pain yesterday after watching a movie and thinks the position and or seat aggravate her symptoms.  pt currently has pain on the lateral aspect of her thigh, knee, and lower leg.  pt reports she has an appointment with MD on Wednesday 11/10/14   Patient Stated Goals want to  be pain free in her left leg   Currently in Pain? Yes   Pain Score 5    Pain Location Leg   Pain Orientation Left      Manual therapy: Thoracic and lumbar CPA glides grade III 3 bouts of 30s each level  Therex:    Diaphragmatic breathing (initiating TA contraction) with tactile and verbal cues x 3 min Diaphragmatic breathing (initiating TA contraction) with LE march 2x10 Diaphragmatic breathing (initiating TA contraction) with mini bridge x 10 Diaphragmatic breathing (initiating TA contraction) with clamshell x10 each side Pt requires mod verbal and tactile cues for proper breathing and pacing                             PT Education - 11/03/14 1013    Education provided Yes   Education Details therex, TA contraction with diaphragmatic breathing    Person(s) Educated Patient   Methods Explanation   Comprehension Verbalized understanding;Returned demonstration             PT Long Term Goals - 10/26/14 1626    PT LONG TERM GOAL #1   Title pt will be able ambulate with gait speed at least 0.8 m/s in  order to be full community ambulator   Baseline deferred   Time 4   Period Weeks   Status New   PT LONG TERM GOAL #2   Title pt will be able to ambulate at least 200 ft with pain less than 4/10 in order return to prior level of function    Baseline pt ambulates with 7/10 pain in her left LE   Time 4   Period Weeks   Status New   PT LONG TERM GOAL #3   Title pt will demonstrate at least 3/5 dorsiflexion MMT in left foot in order to improve foot clearance during gait   Baseline pt has 2+/5 dorsiflexoin    Time 4   Period Weeks   Status New               Plan - 11/04/14 1557    Clinical Impression Statement pt is responding well with lumbar extension based exercises in centralizaing left LE symptoms. Reviewed core strengthening today and pt required mod verbal/tactile cues. pt reports no LE pain and dull ache in low back after session.      Pt will benefit from skilled therapeutic intervention in order to improve on the following deficits Abnormal gait;Postural dysfunction;Decreased strength;Decreased mobility;Decreased balance;Pain;Decreased activity tolerance   Rehab Potential Good   PT Frequency 2x / week   PT Duration 4 weeks   PT Treatment/Interventions Aquatic Therapy;Biofeedback;Cryotherapy;Electrical Stimulation;Moist Heat;Therapeutic exercise;Therapeutic activities;Functional mobility training;Stair training;Gait training;Balance training;Neuromuscular re-education;Manual techniques;Traction;Dry needling        Problem List Patient Active Problem List   Diagnosis Date Noted  . Upper airway cough syndrome 09/16/2014  . 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 Lung consolidation 05/25/2014  . Cough 05/25/2014   Renford Dills, SPT This entire session was performed under direct supervision and direction of a licensed therapist/therapist assistant . I have personally read, edited and approve of the note as written. Gorden Harms. Tortorici, PT, DPT [phone]  Tortorici,Ashley 11/04/2014, 4:17 PM  [place] MAIN [facility] SERVICES [address] [place], [place], [address] Phone: [phone]   Fax:  [phone]

## 2014-11-09 ENCOUNTER — Ambulatory Visit: Payer: Medicare Other

## 2014-11-09 DIAGNOSIS — R2681 Unsteadiness on feet: Secondary | ICD-10-CM

## 2014-11-09 DIAGNOSIS — R269 Unspecified abnormalities of gait and mobility: Secondary | ICD-10-CM

## 2014-11-09 NOTE — Therapy (Signed)
[place] MAIN [facility] SERVICES [address] [place], [place], [address] Phone: [phone]   Fax:  [phone]  Physical Therapy Treatment  Patient Details  Name: Ashley Bray MRN: [id number] Date of Birth: March 09, 1941 Referring Provider:  Emmaline Kluver.,*  Encounter Date: 11/09/2014      PT End of Session - 11/09/14 1836    Visit Number 5   Number of Visits 9   Date for PT Re-Evaluation 11/23/14   Authorization Type 5/10   PT Start Time 1645   PT Stop Time 1730   PT Time Calculation (min) 45 min   Activity Tolerance Patient tolerated treatment well   Behavior During Therapy [facility] for tasks assessed/performed      Past Medical History  Diagnosis Date  . Diabetes   . Hypertension   . HLD (hyperlipidemia)   . RA (rheumatoid arthritis)   . Breast CA     left  . 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 Replacement total knee bilateral      x2  . Rotator cuff repair Right   . Carpal tunnel release Bilateral   . Neck fusion    . Appendectomy    . Abdominal hysterectomy  1991  . Breast lumpectomy    . Breast biopsy Left 08/31/2013    positive    There were no vitals filed for this visit.  Visit Diagnosis:  Unsteadiness on feet  Abnormality of gait  Gait abnormality      Subjective Assessment - 11/09/14 1737    Subjective pt relates she has been doing better since her last visit, especially this weekend.  pt reports she thinks it is the gabapentin is finally in her "system."  pt reports she does have lateral ankle pain which started yesterday after her exercise class and reports this is a different pain than the wone she has been having.  pt reports she has been complient with her HEP and notes relief after performing it.    Patient Stated Goals want to be pain free in her left leg   Currently in Pain? No/denies   Pain  Score 0-No pain        Manual therapy: Thoracic and lumbar CPA glides grade III 3 bouts of 30s each level  Therex:   Diaphragmatic breathing (initiating TA contraction) with verbal cues x 30 sec Bridges 2x10, pt required verbal cues to push through heels to increase hip extensor activation Scapular retractions 2x 10, pt required mod-max verbal and tactile cues for improved technique.  Pt engages anterior neck musculature and occasionally shrugs shoulders Pavlov press with red band 2x10 each, pt required verbal and tactile cues for maintain upright posture and and breathing sequence.                                 PT Long Term Goals - 10/26/14 1626    PT LONG TERM GOAL #1   Title pt will be able ambulate with gait speed at least 0.8 m/s in order to be full community ambulator   Baseline deferred   Time 4   Period Weeks   Status New   PT LONG TERM GOAL #2   Title pt will be able to ambulate at least 200 ft with pain less than 4/10  in order return to prior level of function    Baseline pt ambulates with 7/10 pain in her left LE   Time 4   Period Weeks   Status New   PT LONG TERM GOAL #3   Title pt will demonstrate at least 3/5 dorsiflexion MMT in left foot in order to improve foot clearance during gait   Baseline pt has 2+/5 dorsiflexoin    Time 4   Period Weeks   Status New               Plan - 11/09/14 1838    Clinical Impression Statement pt responded well to thoracic P/A mobs and with core strengthening.  pt has decreased body awarness demonstrated by engaging anterior neck musculature and upper trap during scapular retrations and required mod-max verbal and tactile cues for improved technique.  pt did not experience an increase in pain with core strengthening.  pt 's pain symptoms are improving yet her left ankle dorsiflexion has not improved signficantly.     Pt will benefit from skilled therapeutic intervention in order to improve on  the following deficits Abnormal gait;Postural dysfunction;Decreased strength;Decreased mobility;Decreased balance;Pain;Decreased activity tolerance   Rehab Potential Good   PT Frequency 2x / week   PT Duration 4 weeks   PT Treatment/Interventions Aquatic Therapy;Biofeedback;Cryotherapy;Electrical Stimulation;Moist Heat;Therapeutic exercise;Therapeutic activities;Functional mobility training;Stair training;Gait training;Balance training;Neuromuscular re-education;Manual techniques;Traction;Dry needling   PT Next Visit Plan progress HEP        Problem List Patient Active Problem List   Diagnosis Date Noted  . Upper airway cough syndrome 09/16/2014  . 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 Lung consolidation 05/25/2014  . Cough 05/25/2014   Renford Dills, SPT This entire session was performed under direct supervision and direction of a licensed therapist/therapist assistant . I have personally read, edited and approve of the note as written. Gorden Harms. Tortorici, PT, DPT [phone]  Tortorici,Ashley 11/10/2014, 10:49 AM  [place] MAIN [facility] SERVICES [address] [place], [place], [address] Phone: [phone]   Fax:  [phone]

## 2014-11-09 NOTE — Patient Instructions (Signed)
[email or web address] Bridges 2x10 with toes up

## 2014-11-10 ENCOUNTER — Ambulatory Visit
Admission: RE | Admit: 2014-11-10 | Discharge: 2014-11-10 | Disposition: A | Discharge status: Home / Self Care | Payer: Medicare Other | Source: Ambulatory Visit | Attending: Internal Medicine | Admitting: Internal Medicine

## 2014-11-10 ENCOUNTER — Telehealth: Payer: Self-pay | Admitting: *Deleted

## 2014-11-10 DIAGNOSIS — Z09 Encounter for follow-up examination after completed treatment for conditions other than malignant neoplasm: Secondary | ICD-10-CM | POA: Insufficient documentation

## 2014-11-10 DIAGNOSIS — J700 Acute pulmonary manifestations due to radiation: Secondary | ICD-10-CM

## 2014-11-10 NOTE — Telephone Encounter (Signed)
LM on VM for pt to return call in regards of CT results.

## 2014-11-10 NOTE — Telephone Encounter (Signed)
-----   Message from Vilinda Boehringer, MD sent at 11/10/2014  9:40 AM EDT ----- Regarding: CT results Please inform patient that her recent CT scan shows significant improvement, there is mild scarring, but overall with good improvement. Details to be discussed at follow up visit  Thank you

## 2014-11-11 ENCOUNTER — Ambulatory Visit: Payer: Medicare Other

## 2014-11-11 DIAGNOSIS — R269 Unspecified abnormalities of gait and mobility: Secondary | ICD-10-CM

## 2014-11-11 DIAGNOSIS — R2681 Unsteadiness on feet: Secondary | ICD-10-CM | POA: Diagnosis not present

## 2014-11-11 NOTE — Telephone Encounter (Signed)
Patient informed of CT results.  Nothing further needed.

## 2014-11-11 NOTE — Therapy (Signed)
[place] MAIN [facility] SERVICES [address] [place], [place], [address] Phone: [phone]   Fax:  [phone]  Physical Therapy Treatment  Patient Details  Name: Ashley Bray MRN: [id number] Date of Birth: November 25, 1940 Referring Provider:  Emmaline Kluver.,*  Encounter Date: 11/11/2014      PT End of Session - 11/11/14 1029    Visit Number 6   Number of Visits 9   Date for PT Re-Evaluation 11/23/14   Authorization Type 6/10   PT Start Time 1030   PT Stop Time 1115   PT Time Calculation (min) 45 min   Activity Tolerance Patient tolerated treatment well   Behavior During Therapy [facility] for tasks assessed/performed      Past Medical History  Diagnosis Date  . Diabetes   . Hypertension   . HLD (hyperlipidemia)   . RA (rheumatoid arthritis)   . Breast CA     left  . 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 Replacement total knee bilateral      x2  . Rotator cuff repair Right   . Carpal tunnel release Bilateral   . Neck fusion    . Appendectomy    . Abdominal hysterectomy  1991  . Breast lumpectomy    . Breast biopsy Left 08/31/2013    positive    There were no vitals filed for this visit.  Visit Diagnosis:  Unsteadiness on feet  Abnormality of gait  Gait abnormality      Subjective Assessment - 11/11/14 1030    Subjective pt relates she saw her doctor who recommends for her to continue PT. pt relates HEP has helped with back pain. pt reports 5/10 pain and reports she is waking up around 3 in the mornig due to pain.  pt reports she has LBP that radiates down the lateral aspect of her left leg.     Patient Stated Goals want to be pain free in her left leg   Currently in Pain? Yes   Pain Score 5    Pain Location Hip   Pain Orientation Left       Manual therapy:  Soft tissue mobilization to left posterior hip  (primiraly over piriformis)  Piriformis stretch 4 x30 sec Pt noted decreased pain post manual therapy with decreased tension   There ex: Self massage/trigger point release with tennis ball x3 min to left posterior hip (sitting) Piriformis stretch 3x30 sec Bilateral hip abduction and extension in //bars x10 each Pt required verbal and tactile cues for correct technique                            PT Education - 11/11/14 1455    Education provided Yes   Education Details benefits of soft tissue mobilization combined with exercise and stretching to decrease myofascial pain and new HEP exercises    Person(s) Educated Patient   Methods Explanation;Demonstration   Comprehension Verbalized understanding;Returned demonstration             PT Long Term Goals - 10/26/14 1626    PT LONG TERM GOAL #1   Title pt will be able ambulate with gait speed at least 0.8 m/s in order to be full community ambulator   Baseline deferred   Time 4   Period Weeks   Status  New   PT LONG TERM GOAL #2   Title pt will be able to ambulate at least 200 ft with pain less than 4/10 in order return to prior level of function    Baseline pt ambulates with 7/10 pain in her left LE   Time 4   Period Weeks   Status New   PT LONG TERM GOAL #3   Title pt will demonstrate at least 3/5 dorsiflexion MMT in left foot in order to improve foot clearance during gait   Baseline pt has 2+/5 dorsiflexoin    Time 4   Period Weeks   Status New               Plan - 11/11/14 1456    Clinical Impression Statement pt presented with c/o posterior left hip pain and pain reproduced with palpation over piriformis.  pt reported decreased pain after manual therapy to left posterior hip musculature (primarily to piriformis)from 5/10 to 2-3/10.     Pt will benefit from skilled therapeutic intervention in order to improve on the following deficits Abnormal gait;Postural dysfunction;Decreased  strength;Decreased mobility;Decreased balance;Pain;Decreased activity tolerance   Rehab Potential Good   PT Frequency 2x / week   PT Duration 4 weeks   PT Treatment/Interventions Aquatic Therapy;Biofeedback;Cryotherapy;Electrical Stimulation;Moist Heat;Therapeutic exercise;Therapeutic activities;Functional mobility training;Stair training;Gait training;Balance training;Neuromuscular re-education;Manual techniques;Traction;Dry needling   PT Next Visit Plan progress HEP        Problem List Patient Active Problem List   Diagnosis Date Noted  . Upper airway cough syndrome 09/16/2014  . 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 Lung consolidation 05/25/2014  . Cough 05/25/2014   Renford Dills, SPT This entire session was performed under direct supervision and direction of a licensed therapist/therapist assistant . I have personally read, edited and approve of the note as written. Gorden Harms. Tortorici, PT, DPT [phone]  Tortorici,Ashley 11/11/2014, 5:46 PM  [place] [facility] MAIN [facility] SERVICES [address] [place], [place], [address] Phone: [phone]   Fax:  [phone]

## 2014-11-11 NOTE — Patient Instructions (Signed)
[email or web address] Hip abduction/extension 2x10 Piriformis stretch in supine 3x30 sec Self massage with tennis ball

## 2014-11-16 ENCOUNTER — Ambulatory Visit: Payer: Medicare Other

## 2014-11-16 DIAGNOSIS — R2681 Unsteadiness on feet: Secondary | ICD-10-CM

## 2014-11-16 DIAGNOSIS — R269 Unspecified abnormalities of gait and mobility: Secondary | ICD-10-CM

## 2014-11-16 NOTE — Therapy (Signed)
[place] MAIN [facility] SERVICES [address] [place], [place], [address] Phone: [phone]   Fax:  [phone]  Physical Therapy Treatment  Patient Details  Name: Ashley Bray MRN: [id number] Date of Birth: 11-Jul-1940 Referring Provider:  Emmaline Kluver.,*  Encounter Date: 11/16/2014      PT End of Session - 11/16/14 1241    Visit Number 7   Number of Visits 9   Date for PT Re-Evaluation 11/23/14   Authorization Type 6/10   PT Start Time 0945   PT Stop Time 1030   PT Time Calculation (min) 45 min   Activity Tolerance Patient tolerated treatment well   Behavior During Therapy [facility] for tasks assessed/performed      Past Medical History  Diagnosis Date  . Diabetes   . Hypertension   . HLD (hyperlipidemia)   . RA (rheumatoid arthritis)   . Breast CA     left  . 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 Replacement total knee bilateral      x2  . Rotator cuff repair Right   . Carpal tunnel release Bilateral   . Neck fusion    . Appendectomy    . Abdominal hysterectomy  1991  . Breast lumpectomy    . Breast biopsy Left 08/31/2013    positive    There were no vitals filed for this visit.  Visit Diagnosis:  Unsteadiness on feet  Abnormality of gait  Gait abnormality      Subjective Assessment - 11/16/14 1233    Subjective pt relates she currenlty has 5/10 pain in her left leg, from lateral aspect of her knee down to lateral ankle.  pt relates her leg pain is still waking her up in the middle of the night and has trouble falling asleep after waking up.  pt relates she is complient with HEP.    Patient Stated Goals want to be pain free in her left leg   Pain Score 5    Pain Location Leg   Pain Orientation Left   Pain Descriptors / Indicators Aching        Manual therapy: Thoracic and lumbar CPA glides grade III 3  bouts of 30s each level Increased pain noted along thoracic spine and hypomobility    There ex:  bilateral Piriformis stretch 2x30 sec in supine Bilateral hip abduction and extension in //bars x10 each Bilateral hip abduction and extension in//bars x10 with yellow band Shoulder rows with yellow band x12 Shoulder extension with yellow band x12, pt required min tactile cues to decrease shoulder shrug and verbal cues to decrease anterior neck tension  Pt required verbal and tactile cues for correct technique                          PT Education - 11/16/14 1236    Education provided Yes   Education Details new exercises added to her HEP   Person(s) Educated Patient   Methods Explanation;Demonstration   Comprehension Verbalized understanding;Returned demonstration             PT Long Term Goals - 10/26/14 1626    PT LONG TERM GOAL #1   Title pt will be able ambulate with gait speed at least 0.8 m/s in order to be full community ambulator   Baseline deferred  Time 4   Period Weeks   Status New   PT LONG TERM GOAL #2   Title pt will be able to ambulate at least 200 ft with pain less than 4/10 in order return to prior level of function    Baseline pt ambulates with 7/10 pain in her left LE   Time 4   Period Weeks   Status New   PT LONG TERM GOAL #3   Title pt will demonstrate at least 3/5 dorsiflexion MMT in left foot in order to improve foot clearance during gait   Baseline pt has 2+/5 dorsiflexoin    Time 4   Period Weeks   Status New               Plan - 11/16/14 1241    Clinical Impression Statement pt presented with pain along thoracic spine during joint mobs, primarily T1-T6.  pt did not report increased  pain with hip and back strengthen progression today.  pt required min verbal and tactile cues for correct technique,    Pt will benefit from skilled therapeutic intervention in order to improve on the following deficits Abnormal  gait;Postural dysfunction;Decreased strength;Decreased mobility;Decreased balance;Pain;Decreased activity tolerance   Rehab Potential Good   PT Frequency 2x / week   PT Duration 4 weeks   PT Treatment/Interventions Aquatic Therapy;Biofeedback;Cryotherapy;Electrical Stimulation;Moist Heat;Therapeutic exercise;Therapeutic activities;Functional mobility training;Stair training;Gait training;Balance training;Neuromuscular re-education;Manual techniques;Traction;Dry needling   PT Next Visit Plan progress HEP        Problem List Patient Active Problem List   Diagnosis Date Noted  . Upper airway cough syndrome 09/16/2014  . 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 Lung consolidation 05/25/2014  . Cough 05/25/2014   Renford Dills, SPT This entire session was performed under direct supervision and direction of a licensed therapist/therapist assistant . I have personally read, edited and approve of the note as written. Phillips Grout PT, DPT Huprich,Jason 11/17/2014, 9:16 AM  [place] MAIN [facility] SERVICES [address] [place], [place], [address] Phone: [phone]   Fax:  [phone]

## 2014-11-16 NOTE — Progress Notes (Signed)
ROS Erroneous entry.

## 2014-11-16 NOTE — Patient Instructions (Signed)
[email or web address] Hip abduction and extension with yellow band 2x10 Shoulder rows with yellow band 2x10 Shoulder extension with yellow band 2x10

## 2014-11-17 ENCOUNTER — Ambulatory Visit (INDEPENDENT_AMBULATORY_CARE_PROVIDER_SITE_OTHER): Payer: Medicare Other | Admitting: Internal Medicine

## 2014-11-17 ENCOUNTER — Encounter: Payer: Self-pay | Admitting: Internal Medicine

## 2014-11-17 VITALS — BP 132/68 | HR 78 | Ht 61.0 in | Wt 127.0 lb

## 2014-11-17 DIAGNOSIS — J700 Acute pulmonary manifestations due to radiation: Secondary | ICD-10-CM

## 2014-11-17 MED ORDER — PREDNISONE 5 MG PO TABS: ORAL_TABLET | ORAL | Status: DC

## 2014-11-17 NOTE — Assessment & Plan Note (Signed)
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 Treatment for radiation pneumonitis as prolonged steroids starting with high-dose and can last be on 6 months. Currently doing well, will plan to wean and get a follow up CXR just prior to her follow up visit.  New regiment - Prednisone 5mg  daily with breakfast for 2 weeks, then 5mg  every other day for 2 weeks then stop.  Plan: - doing well today, now with complete resolution of LLL consolidation with mild scarring on CT, new RLL GGO, suspect that this might be some mild inflammation from aspiration and chronic sinus drainage, will plan for f/u 2 view CXR prior to next visit.  - Prednisone 5mg  daily with breakfast for 2 weeks, then 5mg  every other day for 2 weeks then stop.

## 2014-11-17 NOTE — Addendum Note (Signed)
Addended by: Oscar La R on: 11/17/2014 01:18 PM   Modules accepted: Orders

## 2014-11-17 NOTE — Patient Instructions (Signed)
Follow up with Dr. Stevenson Clinch in 3 months - Prednisone 5mg  daily with breakfast for 2 weeks, then 5mg  every other day for 2 weeks then stop. - 2 view CXR prior to follow up visit.

## 2014-11-17 NOTE — Progress Notes (Signed)
Ashley Bray      MRN# [id number] Ashley Bray 1940-10-13   CC: Chief Complaint  Patient presents with  . Follow-up    no issues or concerns with breathing; some drainage;   therapy August 2015, found to have consolidation groundglass opacities repeat CT March 2016. Bronchoscopy with BAL showed no atypical organisms and all cultures. 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   06/10/14 ROV: Patient presents today for a followup visit. She was last seen for suspected left upper lobe consolidation and groundglass opacities thought to be possible pneumonia, atypical infection, radiation pneumonitis. She had bronchoscopy performed on 05/26/2014 which he tolerated well. All cultures from that current bronchoscopy currently negative. Patient followup with her oncologist on 06/08/2014 and was told that she restarted on steroids for suspected radiation pneumonitis. At that time she was started on 50 mg of prednisone and advised to followup with pulmonary for further recommendations to treat her left upper lobe abnormality.  Today patient states that she still has low energy, but she is managing okay, but she still endorses a dry cough, it is intermittent at times. 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 States that her breathing is doing well, no further cough or sob. 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   Had repeat CXR done. Plan: Prednisone 30m for 3 weeks, then 339muntil follow up   ROV 08/24/14  She presents today for follow-up visit of her radiation pneumonitis. Since her last visit she had one acute care visit I'll about pulmonary Ashley Bray, she was seen and evaluated for cough and nasal congestion, diagnosed with upper airway cough  syndrome and advised to continue with supportive care. She states that today she is doing overall well, she does not endorse any nasal congestion, or cough. States that she is very to wean down prednisone more if possible today. States that she saw her primary care physician, has a history of left foot drop and will be following with neurology. Overall states that her breathing is improving, and she feels she is back to her baseline.   ROV 09/16/14:    She presents today for an acute visit, she is accompanied by her husband. Patient states that she's been having sinus drainage, which is clear, and nasal congestion for the past couple of days. She has been using nasal steroids intermittently and netti pot with good relief. A few weeks ago she had similar symptoms and was diagnosed with postnasal drip and upper airway cough syndrome. Today she denies any worsening shortness of breath, any worsening cough, no fever, no productive sputum. Overall patient states that she is doing well, however she has noticed that she's had some more hair loss since being on steroids. Plan: - doing well today, almost complete resolution of LLL consolidation based on last study - Prednisone 1565mntil follow up appointment, if symptoms return, then go back to 20m27mplan for repeat cxr, 2 view, prior to follow up visit.  ROV 10/13/14 Patient presents today for a follow-up of her previous treatment for radiation pneumonitis. She states since her last visit she is doing well, she had a repeat chest x-ray done recently that showed complete resolution of the left lower lobe consolidation. Patient recently had a lost and her family, her brother just passed away. Patient  states she has a mild non-productive intermittent cough maybe once or twice a week. Overall patient states she has great critical improvement and is doing well.   Patient stated today that she is not noticing a large amount of hair loss since reduction  of prednisone at last visit. Plan: - doing well today, now with complete resolution of LLL consolidation based on last study - Prednisone 6m until follow up appointment, if symptoms return, then go back to 235m   Events since last clinic visit: Presents for follow up of radiation pneumonitis, currently on 1060mrednisone daily. Still with mild cough in the morning, more of a clearing of throat. Overall with good clinical improvement from a pulmonary standpoint. Off, note following with PMD for foot drop (right), getting nerve conduction studies  Repeat CT performed on 11/10/14   Medication:   Current Outpatient Rx  Name  Route  Sig  Dispense  Refill  . benzonatate (TESSALON) 100 MG capsule   Oral   Take 1 capsule by mouth 3 (three) times daily as needed.      0   . Blood Glucose Monitoring Suppl (ONE TOUCH ULTRA 2) W/DEVICE KIT            11     Dispense as written.   . cyanocobalamin 2000 MCG tablet   Oral   Take 2,000 mcg by mouth daily.         . folic acid (FOLVITE) 1 MG tablet   Oral   Take 1 mg by mouth daily.      11   . gabapentin (NEURONTIN) 100 MG capsule   Oral   Take 100 mg by mouth 3 (three) times daily.         . GMarland KitchenUCAGON EMERGENCY 1 MG injection            0     Dispense as written.   . hydroxychloroquine (PLAQUENIL) 200 MG tablet   Oral   Take 200 mg by mouth daily.         . insulin aspart (NOVOLOG) 100 UNIT/ML FlexPen      Per Sliding scale         . metFORMIN (GLUCOPHAGE) 1000 MG tablet   Oral   Take 1,000 mg by mouth 2 (two) times daily with a meal.       1   . methotrexate (RHEUMATREX) 2.5 MG tablet   Oral   Take 20 mg by mouth every 7 (seven) days. ON HOLD         . montelukast (SINGULAIR) 10 MG tablet   Oral   Take 1 tablet (10 mg total) by mouth at bedtime.   30 tablet   1   . pravastatin (PRAVACHOL) 40 MG tablet   Oral   Take 40 mg by mouth daily.         . predniSONE (DELTASONE) 10 MG tablet    Oral   Take 1 tablet (10 mg total) by mouth daily with breakfast.   20 tablet   0      Review of Systems  Constitutional: Negative for fever, chills and weight loss.  HENT: Negative for congestion and ear discharge.   Eyes: Negative.  Negative for blurred vision.  Respiratory: Positive for cough and sputum production. Negative for shortness of breath.        Mild cough - more clearing of the throat  Cardiovascular: Negative for chest pain and palpitations.  Gastrointestinal: Negative for heartburn, nausea and  vomiting.  Genitourinary: Negative.   Skin: Negative for itching and rash.  Neurological: Positive for tingling. Negative for headaches.  Endo/Heme/Allergies: Negative.   Psychiatric/Behavioral: Negative.       Allergies:  Codeine  Physical Examination:  VS: There were no vitals taken for this visit.  General Appearance: No distress  HEENT: PERRLA, no ptosis, no other lesions noticed Pulmonary:normal breath sounds., diaphragmatic excursion normal.No wheezing, No rales   Cardiovascular:  Normal S1,S2.  No m/r/g.     Abdomen:Exam: Benign, Soft, non-tender, No masses  Skin:   warm, no rashes, no ecchymosis  Extremities: normal, no cyanosis, clubbing, warm with normal capillary refill.      Rad results: (The following images and results were reviewed by Dr. Stevenson Clinch). CT Chest 11/10/14 Dense consolidation with air bronchograms in the lingula has nearly resolved. There remains some mild linear scarring in the lingula. Air bronchograms have resolved. Patchy infiltrate in the left lower lobe has nearly resolved with a small amount of residual scarring in this area as well. Mild patchy infiltrates in the upper lobes bilaterally in the apices have resolved.  New area of infiltrate in the superior segment right lower lobe is mild but not present previously.  Negative for pleural effusion. Negative for adenopathy.  2 cm seroma left breast has improved considerably  since the prior study.  Negative for thoracic fracture or mass lesion.  IMPRESSION: Considerable improvement in dense lingular consolidation with air bronchograms. Mild linear scarring remains in this area. Similar improvement left lower lobe infiltrate and biapical infiltrates.  New area of infiltrate in the superior segment right lower lobe which is mild.  The above areas are most consistent with infectious pneumonia given their multifocal pattern and location outside of the radiation field.    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 Treatment for radiation pneumonitis as prolonged steroids starting with high-dose and can last be on 6 months. Currently doing well, will plan to wean and get a follow up CXR just prior to her follow up visit.  New regiment - Prednisone 30m daily with breakfast for 2 weeks, then 533mevery other day for 2 weeks then stop.  Plan: - doing well today, now with complete resolution of LLL consolidation with mild scarring on CT, new RLL GGO, suspect that this might be some mild inflammation from aspiration and chronic sinus drainage, will plan for f/u 2 view CXR prior to next visit.  - Prednisone 74m32maily with breakfast for 2 weeks, then 74mg24mery other day for 2 weeks then stop.              Updated Medication List Outpatient Encounter Prescriptions as of 11/17/2014  Medication Sig  . benzonatate (TESSALON) 100 MG capsule Take 1 capsule by mouth 3 (three) times daily as needed.  . Blood Glucose Monitoring Suppl (ONE TOUCH ULTRA 2) W/DEVICE KIT   . cyanocobalamin 2000 MCG tablet Take 2,000 mcg by mouth daily.  . folic acid (FOLVITE) 1 MG tablet Take 1 mg by mouth daily.  . gaMarland Kitchenapentin (NEURONTIN) 100 MG capsule Take 100 mg by mouth 3  (three) times daily.  . GLMarland KitchenCAGON EMERGENCY 1 MG injection   . hydroxychloroquine (PLAQUENIL) 200 MG tablet Take 200 mg by mouth daily.  . insulin aspart (NOVOLOG) 100 UNIT/ML FlexPen Per Sliding scale  . metFORMIN (GLUCOPHAGE) 1000 MG tablet  Take 1,000 mg by mouth 2 (two) times daily with a meal.   . methotrexate (RHEUMATREX) 2.5 MG tablet Take 20 mg by mouth every 7 (seven) days. ON HOLD  . montelukast (SINGULAIR) 10 MG tablet Take 1 tablet (10 mg total) by mouth at bedtime.  . pravastatin (PRAVACHOL) 40 MG tablet Take 40 mg by mouth daily.  . predniSONE (DELTASONE) 10 MG tablet Take 1 tablet (10 mg total) by mouth daily with breakfast.  . [DISCONTINUED] predniSONE (DELTASONE) 5 MG tablet Take 3 tabs daily with breakfast   No facility-administered encounter medications on file as of 11/17/2014.    Orders for this visit: No orders of the defined types were placed in this encounter.    Thank  you for the visitation and for allowing  [facility] to assist in the care of your patient. Our recommendations are noted above.  Please contact us if we can be of further service.  Vilinda Boehringer, MD [facility] Office Number: [phone]

## 2014-11-18 ENCOUNTER — Ambulatory Visit: Payer: Medicare Other | Attending: Rheumatology

## 2014-11-18 DIAGNOSIS — R269 Unspecified abnormalities of gait and mobility: Secondary | ICD-10-CM

## 2014-11-18 DIAGNOSIS — R2681 Unsteadiness on feet: Secondary | ICD-10-CM | POA: Insufficient documentation

## 2014-11-18 DIAGNOSIS — M5442 Lumbago with sciatica, left side: Secondary | ICD-10-CM | POA: Insufficient documentation

## 2014-11-18 NOTE — Therapy (Signed)
[place] MAIN [facility] SERVICES [address] [place], [place], [address] Phone: [phone]   Fax:  [phone]  Physical Therapy Treatment  Patient Details  Name: Ashley Bray MRN: [id number] Date of Birth: 12-05-1940 Referring Provider:  Emmaline Kluver.,*  Encounter Date: 11/18/2014      PT End of Session - 11/18/14 1109    Visit Number 8   Number of Visits 9   Date for PT Re-Evaluation 11/23/14   Authorization Type 8/10   PT Start Time 0945   PT Stop Time 1030   PT Time Calculation (min) 45 min   Activity Tolerance Patient tolerated treatment well   Behavior During Therapy [facility] for tasks assessed/performed      Past Medical History  Diagnosis Date  . Diabetes   . Hypertension   . HLD (hyperlipidemia)   . RA (rheumatoid arthritis)   . Breast CA     left  . 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 Replacement total knee bilateral      x2  . Rotator cuff repair Right   . Carpal tunnel release Bilateral   . Neck fusion    . Appendectomy    . Abdominal hysterectomy  1991  . Breast lumpectomy    . Breast biopsy Left 08/31/2013    positive    There were no vitals filed for this visit.  Visit Diagnosis:  Unsteadiness on feet  Abnormality of gait  Gait abnormality      Subjective Assessment - 11/18/14 0940    Subjective Pt relates her heel pain has moved up her leg to above her ankle.  She relates all her symptoms improve when she walks versus sitting. Pt reports she has an achy pain in her left lower leg with no numbness and tingling currently. Pt reports she has 5/10 low back pain currently.     Patient Stated Goals want to be pain free in her left leg   Pain Score 5    Pain Location Back   Pain Orientation Mid;Lower   Pain Descriptors / Indicators Aching         Manual therapy:  L posterior hip palpation  (deep external rotators) without reproduction of numbness/tingling or pain in LLE; L Hip PA mobilizations in prone with hip at end range passive extension, grade 2, 20 seconds/bout x 3 bouts; L hip medial to lateral mobilizations, grade 2, 20 seconds/bout x 3 bouts; L hip inferior mobilizations with hip flexed at 45 degrees (foot flat on table) grade 2, 20 seconds/bout x 3 bouts; L hip long axis distraction with oscillation; Supine L hip IR stretch 20 second hold x 2, pain limited;  There-ex: Bridges 3x10 Side stepping with yellow band above knees x5 laps in //bars Pt required verbal and visual cueing for bending knees and maintaining up right posture  Pt noted slight decrease in low back pain post treatment.                         PT Education - 11/18/14 1108    Education provided Yes   Education Details benefits of improving hip strength and ROM for low back pain    Person(s) Educated Patient   Methods Explanation   Comprehension Verbalized understanding  PT Long Term Goals - 10/26/14 1626    PT LONG TERM GOAL #1   Title pt will be able ambulate with gait speed at least 0.8 m/s in order to be full community ambulator   Baseline deferred   Time 4   Period Weeks   Status New   PT LONG TERM GOAL #2   Title pt will be able to ambulate at least 200 ft with pain less than 4/10 in order return to prior level of function    Baseline pt ambulates with 7/10 pain in her left LE   Time 4   Period Weeks   Status New   PT LONG TERM GOAL #3   Title pt will demonstrate at least 3/5 dorsiflexion MMT in left foot in order to improve foot clearance during gait   Baseline pt has 2+/5 dorsiflexoin    Time 4   Period Weeks   Status New               Plan - 11/18/14 1116    Clinical Impression Statement pt reports minor improvement with her low back and LE symptoms since the start of PT.  Today's session focused on improving hip mobility, manual  traction and hip strengthening.  Pt responded well to manual therapy and pt noted decreased pain post treatment.    Pt will benefit from skilled therapeutic intervention in order to improve on the following deficits Abnormal gait;Postural dysfunction;Decreased strength;Decreased mobility;Decreased balance;Pain;Decreased activity tolerance   Rehab Potential Good   PT Frequency 2x / week   PT Duration 4 weeks   PT Treatment/Interventions Aquatic Therapy;Biofeedback;Cryotherapy;Electrical Stimulation;Moist Heat;Therapeutic exercise;Therapeutic activities;Functional mobility training;Stair training;Gait training;Balance training;Neuromuscular re-education;Manual techniques;Traction;Dry needling   PT Next Visit Plan progress note        Problem List Patient Active Problem List   Diagnosis Date Noted  . Upper airway cough syndrome 09/16/2014  . 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 Lung consolidation 05/25/2014  . Cough 05/25/2014   Renford Dills, SPT This entire session was performed under direct supervision and direction of a licensed therapist/therapist assistant . I have personally read, edited and approve of the note as written.  Phillips Grout PT, DPT    Huprich,Jason 11/19/2014, 8:27 AM  [place] MAIN [facility] SERVICES [address] [place], [place], [address] Phone: [phone]   Fax:  [phone]

## 2014-11-23 ENCOUNTER — Ambulatory Visit: Payer: Medicare Other

## 2014-11-23 DIAGNOSIS — R2681 Unsteadiness on feet: Secondary | ICD-10-CM | POA: Diagnosis not present

## 2014-11-23 DIAGNOSIS — R269 Unspecified abnormalities of gait and mobility: Secondary | ICD-10-CM

## 2014-11-23 NOTE — Patient Instructions (Signed)
[email or web address] Bilateral shoulder extension with yellow band 2x10 Bilateral shoulder rows with yellow band 2x10 Pallof press with yellow band 2x10

## 2014-11-23 NOTE — Therapy (Signed)
[place] MAIN [facility] SERVICES [place], [place], [address] Phone: [phone]   Fax:  [phone]  Physical Therapy Treatment/Progress Note 10/26/14-11/23/2014   Patient Details  Name: Ashley Bray MRN: [id number] Date of Birth: January 20, 1941 Referring Provider:  Emmaline Kluver.,*  Encounter Date: 11/23/2014      PT End of Session - 11/23/14 1020    Visit Number 9   Number of Visits 17   Date for PT Re-Evaluation 12/21/14   Authorization Type 1/10   PT Start Time 0845   PT Stop Time 0930   PT Time Calculation (min) 45 min   Activity Tolerance Patient tolerated treatment well   Behavior During Therapy [facility] for tasks assessed/performed      Past Medical History  Diagnosis Date  . Diabetes   . Hypertension   . HLD (hyperlipidemia)   . RA (rheumatoid arthritis)   . Breast CA     left  . 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 Replacement total knee bilateral      x2  . Rotator cuff repair Right   . Carpal tunnel release Bilateral   . Neck fusion    . Appendectomy    . Abdominal hysterectomy  1991  . Breast lumpectomy    . Breast biopsy Left 08/31/2013    positive    There were no vitals filed for this visit.  Visit Diagnosis:  Unsteadiness on feet - Plan: PT plan of care cert/re-cert  Abnormality of gait - Plan: PT plan of care cert/re-cert  Gait abnormality - Plan: PT plan of care cert/re-cert      Subjective Assessment - 11/23/14 1016    Subjective Pt reports she currently has 2/10, from her heel to her low back.  Pt reports she has been compliant with her HEP except with her postural exercises with yellow band due to being busy and finding a good place to do them.  Pt reports lying on her back and sitting extended amount of time increases her symptoms.  Pt reports using lumbar support and sitting up right  helps relieve her symptoms and walking.  Pt relates she was on her "feet a lot" this weekend and her symptoms did not bother her.  Pt reports her symptoms have improved since the start of PT.     Patient Stated Goals want to be pain free in her left leg   Currently in Pain? Yes   Pain Score 2    Pain Location --  from her heel to her low back   Pain Orientation Left      There ex: SPT assessed outcome measures and progress towards goals 6 min walk test: 1335 ft Gait Speed: 1.04 m/s (normal) 1.24 m/s (fast pace) Left ankle dorsiflexion MMT: 3-/5 LEFS: 62/80=77.5 %  pavlov press with yellow band 2x10 each Shoulder extension with yellow band 2x10 Shoulder rows with yellow band 2x10 Pt required min verbal, tactile and visual cues for correct technique, especially decreasing shoulder shrugging Pt reported 0/10 after exercises                          PT Education - 11/23/14 1019    Education provided Yes   Education Details plan of care, HEP (reviwed exercises and introduced pallof press)   Person(s) Educated  Patient   Methods Explanation;Demonstration   Comprehension Verbalized understanding;Returned demonstration;Verbal cues required;Tactile cues required             PT Long Term Goals - December 09, 2014 1029    PT LONG TERM GOAL #1   Title pt will be able ambulate with gait speed at least 0.8 m/s in order to be full community ambulator   Baseline achieved on 2014-12-09 with 1.04 m/s (normal pace) and 1.24 m/s (fast pace)   Time 4   Period Weeks   Status Achieved   PT LONG TERM GOAL #2   Title pt will be able to ambulate at least 200 ft with pain less than 4/10 in order return to prior level of function    Baseline pt ambulated 1335 ft with 2/10 pain    Time 4   Period Weeks   Status Achieved   PT LONG TERM GOAL #3   Title pt will demonstrate at least 3/5 dorsiflexion MMT in left foot in order to improve foot clearance during gait   Baseline pt has 2+/5  dorsiflexoin on eval and 3-/5 MMT on 2014/12/09   Time 4   Period Weeks   Status On-going   PT LONG TERM GOAL #4   Title pt will be able to ascend descend 4 steps with step over step pattern with less than 2/10 pain    Baseline pt reports she afraid of performing stair negotiation with step over pattern without use of rail    Time 4   Period Weeks   Status New               Plan - 09-Dec-2014 1021    Clinical Impression Statement pt has made improvements with PT and has partially met or met her goals for PT.  pt has responded well to hip and core strengthening exercises well and reports decreased pain or no pain after exercises.  Pts left dorsiflexion strength has not changed but her motion and quality has improved.  Pt is able to ambulate sufficient left foot clearance but with a small limp.  pt would benefit from continued skilled PT services to make further gains and progress to prior level of function.     Pt will benefit from skilled therapeutic intervention in order to improve on the following deficits Abnormal gait;Postural dysfunction;Decreased strength;Decreased mobility;Decreased balance;Pain;Decreased activity tolerance   Rehab Potential Good   PT Frequency 2x / week   PT Duration 4 weeks   PT Treatment/Interventions Aquatic Therapy;Biofeedback;Cryotherapy;Electrical Stimulation;Moist Heat;Therapeutic exercise;Therapeutic activities;Functional mobility training;Stair training;Gait training;Balance training;Neuromuscular re-education;Manual techniques;Traction;Dry needling   PT Next Visit Plan progress HEP          G-Codes - 12-09-14 1535    Functional Limitation Mobility: Walking and moving around   Mobility: Walking and Moving Around Current Status [phone]) At least 20 percent but less than 40 percent impaired, limited or restricted  ~58%   Mobility: Walking and Moving Around Goal Status [phone]) At least 20 percent but less than 40 percent impaired, limited or restricted       Problem List Patient Active Problem List   Diagnosis Date Noted  . Upper airway cough syndrome 09/16/2014  . 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 Lung consolidation 05/25/2014  . Cough 05/25/2014  Renford Dills, SPT This entire session was performed under direct supervision and direction of a licensed therapist/therapist assistant . I have personally read, edited and approve of the note as written. Gorden Harms. Tortorici, PT, DPT [phone]  Tortorici,Ashley 2014/12/09, 4:05  PM  [place] MAIN [facility] SERVICES [address] [place], [place], [address] Phone: [phone]   Fax:  [phone]

## 2014-11-25 ENCOUNTER — Ambulatory Visit: Payer: Medicare Other

## 2014-11-25 DIAGNOSIS — R269 Unspecified abnormalities of gait and mobility: Secondary | ICD-10-CM

## 2014-11-25 DIAGNOSIS — R2681 Unsteadiness on feet: Secondary | ICD-10-CM | POA: Diagnosis not present

## 2014-11-25 NOTE — Therapy (Signed)
[place] MAIN [facility] SERVICES [address] [place], [place], [address] Phone: [phone]   Fax:  [phone]  Physical Therapy Treatment  Patient Details  Name: Ashley Bray MRN: [id number] Date of Birth: [birth year] Referring Provider:  Emmaline Kluver.,*  Encounter Date: 11/25/2014      PT End of Session - 11/25/14 1555    Visit Number 10   Number of Visits 17   Date for PT Re-Evaluation 12/21/14   Authorization Type 2/10   PT Start Time 1600   PT Stop Time 1645   PT Time Calculation (min) 45 min   Activity Tolerance Patient tolerated treatment well   Behavior During Therapy [facility] for tasks assessed/performed      Past Medical History  Diagnosis Date  . Diabetes   . Hypertension   . HLD (hyperlipidemia)   . RA (rheumatoid arthritis)   . Breast CA     left  . 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 Replacement total knee bilateral      x2  . Rotator cuff repair Right   . Carpal tunnel release Bilateral   . Neck fusion    . Appendectomy    . Abdominal hysterectomy  1991  . Breast lumpectomy    . Breast biopsy Left 08/31/2013    positive    There were no vitals filed for this visit.  Visit Diagnosis:  Unsteadiness on feet  Abnormality of gait  Gait abnormality      Subjective Assessment - 11/25/14 1825    Subjective pt reports she experienced sharp pain this afternoon while doing exercise in her left leg that resolved quickly.  She woke up this morning feeling pain/soreness all over and feels better currently.  Pt's current pain  is 3/10 in her low back. Pt states she has been compliant with HEP.     Patient Stated Goals want to be pain free in her left leg   Pain Score 3    Pain Location Back   Pain Orientation Mid;Lower       there ex:  Ascend/descend 4 stepx 2, pt demonstrates decrease eccentric  control while going down steps but is able to clear steps Wall slides with lift 2x10 Cervical retractions 2x10, pt required mod verbal, visual and tactile for correct technique Pavlov press with yellow band while sitting on physio ball 2x10 each side Sitting marching on physio ball, pt required close supervision and occasional min A to recover Side stepping with yellow band above knees 3 laps x 12 ft, pt required verbal and visual cueing for correct technique Backwards stepping with yellow band above knees 2 laps x12 ft Left calf stretch 2x20 sec, pt required tactile cues for keeping toes pointing forward and heel on the ground.                           PT Education - 11/25/14 1826    Education provided Yes   Education Details exercise progression   Person(s) Educated Patient   Methods Explanation   Comprehension Verbalized understanding             PT Long Term Goals - 11/23/14 1029    PT LONG TERM GOAL #1   Title pt will be able ambulate with gait speed at least 0.8 m/s in  order to be full community ambulator   Baseline achieved on 11/23/14 with 1.04 m/s (normal pace) and 1.24 m/s (fast pace)   Time 4   Period Weeks   Status Achieved   PT LONG TERM GOAL #2   Title pt will be able to ambulate at least 200 ft with pain less than 4/10 in order return to prior level of function    Baseline pt ambulated 1335 ft with 2/10 pain    Time 4   Period Weeks   Status Achieved   PT LONG TERM GOAL #3   Title pt will demonstrate at least 3/5 dorsiflexion MMT in left foot in order to improve foot clearance during gait   Baseline pt has 2+/5 dorsiflexoin on eval and 3-/5 MMT on 11/23/14   Time 4   Period Weeks   Status On-going   PT LONG TERM GOAL #4   Title pt will be able to ascend descend 4 steps with step over step pattern with less than 2/10 pain    Baseline pt reports she afraid of performing stair negotiation with step over pattern without use of rail    Time 4    Period Weeks   Status New               Plan - 11/25/14 1828    Clinical Impression Statement Session focused primarily on core and hip strengthening.  pt has demonstrates difficulty performing sitting marching on physio ball but performance improved with repetitions.  Pt reported no pain in her back at the end of the session but pain in her lateral lower leg which started today during her exercise earlier in the day.     Pt will benefit from skilled therapeutic intervention in order to improve on the following deficits Abnormal gait;Postural dysfunction;Decreased strength;Decreased mobility;Decreased balance;Pain;Decreased activity tolerance   Rehab Potential Good   PT Frequency 2x / week   PT Duration 4 weeks   PT Treatment/Interventions Aquatic Therapy;Biofeedback;Cryotherapy;Electrical Stimulation;Moist Heat;Therapeutic exercise;Therapeutic activities;Functional mobility training;Stair training;Gait training;Balance training;Neuromuscular re-education;Manual techniques;Traction;Dry needling   PT Next Visit Plan progress HEP        Problem List Patient Active Problem List   Diagnosis Date Noted  . Upper airway cough syndrome 09/16/2014  . 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 Lung consolidation 05/25/2014  . Cough 05/25/2014   Renford Dills, SPT  This entire session was performed under direct supervision and direction of a licensed therapist/therapist assistant . I have personally read, edited and approve of the note as written. Gorden Harms. Tortorici, PT, DPT [phone]  Tortorici,Ashley 11/25/2014, 6:41 PM  [place] MAIN [facility] SERVICES [address] [place], [place], [address] Phone: [phone]   Fax:  [phone]

## 2014-11-30 ENCOUNTER — Ambulatory Visit: Payer: Medicare Other

## 2014-11-30 DIAGNOSIS — R2681 Unsteadiness on feet: Secondary | ICD-10-CM | POA: Diagnosis not present

## 2014-11-30 DIAGNOSIS — R269 Unspecified abnormalities of gait and mobility: Secondary | ICD-10-CM

## 2014-11-30 NOTE — Patient Instructions (Signed)
[email or web address] Supine marching 2x10 Resisted lumbar extension in sitting with band 2x10

## 2014-11-30 NOTE — Therapy (Signed)
[place] MAIN [facility] SERVICES [place], [place], [address] Phone: [phone]   Fax:  [phone]  Physical Therapy Treatment  Patient Details  Name: Ashley Bray MRN: [id number] Date of Birth: [birth year] Referring Provider:  Emmaline Kluver.,*  Encounter Date: 11/30/2014      PT End of Session - 11/30/14 1204    Visit Number 11   Number of Visits 17   Date for PT Re-Evaluation 12/21/14   Authorization Type 3/10   PT Start Time 0845   PT Stop Time 0930   PT Time Calculation (min) 45 min   Activity Tolerance Patient tolerated treatment well   Behavior During Therapy [facility] for tasks assessed/performed      Past Medical History  Diagnosis Date  . Diabetes   . Hypertension   . HLD (hyperlipidemia)   . RA (rheumatoid arthritis)   . Breast CA     left  . 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 Replacement total knee bilateral      x2  . Rotator cuff repair Right   . Carpal tunnel release Bilateral   . Neck fusion    . Appendectomy    . Abdominal hysterectomy  1991  . Breast lumpectomy    . Breast biopsy Left 08/31/2013    positive    There were no vitals filed for this visit.  Visit Diagnosis:  Unsteadiness on feet  Abnormality of gait  Gait abnormality      Subjective Assessment - 11/30/14 1200    Subjective pt reports over the weekend she walked on unleveled surface  for ~0.5 mile and did not experience any problems.    pt continues to have pain while sleeping and it occasionally wakes her up.  Her pain is from her left ankle to her back but notes the intensity of her pain has improved since the start of PT.  pt relates she has to "walk it out" to decrease her pain.  Pt has no pain currently.     Patient Stated Goals want to be pain free in her left leg   Currently in Pain? No/denies   Pain Score 0-No  pain         there ex:  Cervical retractions x10, pt required mod verbal, visual and tactile for correct technique supine marching 2x10, pt required verbal and tactile cues for correct form Bridges 2x10, pt instructed to increase knee flexion and push through heels for gluteal activation Side stepping with yellow band above knees  4 laps x 12 ft, pt required verbal and visual cueing for correct technique Backwards stepping with yellow band above knees 4 laps x12 ft Heel walks in //bars with UE assist x3 laps to increase dorsiflexor activation  Rocking on forward and backwards to promote dorsiflexor activation  Scapular retractions x10 Lumbar extension in sitting with red band 2x10, pt required verbal cueing for correct hand placement                                  PT Education - 11/30/14 1203    Education provided Yes   Education Details new HEP exercises and reviewed HEP for proper technique    Person(s) Educated Patient   Methods Explanation;Demonstration   Comprehension Verbalized understanding;Returned demonstration  PT Long Term Goals - 11/23/14 1029    PT LONG TERM GOAL #1   Title pt will be able ambulate with gait speed at least 0.8 m/s in order to be full community ambulator   Baseline achieved on 11/23/14 with 1.04 m/s (normal pace) and 1.24 m/s (fast pace)   Time 4   Period Weeks   Status Achieved   PT LONG TERM GOAL #2   Title pt will be able to ambulate at least 200 ft with pain less than 4/10 in order return to prior level of function    Baseline pt ambulated 1335 ft with 2/10 pain    Time 4   Period Weeks   Status Achieved   PT LONG TERM GOAL #3   Title pt will demonstrate at least 3/5 dorsiflexion MMT in left foot in order to improve foot clearance during gait   Baseline pt has 2+/5 dorsiflexoin on eval and 3-/5 MMT on 11/23/14   Time 4   Period Weeks   Status On-going   PT LONG TERM GOAL #4   Title pt will be  able to ascend descend 4 steps with step over step pattern with less than 2/10 pain    Baseline pt reports she afraid of performing stair negotiation with step over pattern without use of rail    Time 4   Period Weeks   Status New               Plan - 11/30/14 1207    Clinical Impression Statement Pt was able to complete session without any complains of pain with new exercises.  pt continues to demonstrate difficulty dorsiflexing her left ankle especially during heel walks.  Pt occasionally stumbles, especially during sidestepping activities due difficulty clearing left foot.     Pt will benefit from skilled therapeutic intervention in order to improve on the following deficits Abnormal gait;Postural dysfunction;Decreased strength;Decreased mobility;Decreased balance;Pain;Decreased activity tolerance   Rehab Potential Good   PT Frequency 2x / week   PT Duration 4 weeks   PT Treatment/Interventions Aquatic Therapy;Biofeedback;Cryotherapy;Electrical Stimulation;Moist Heat;Therapeutic exercise;Therapeutic activities;Functional mobility training;Stair training;Gait training;Balance training;Neuromuscular re-education;Manual techniques;Traction;Dry needling   PT Next Visit Plan progress HEP        Problem List Patient Active Problem List   Diagnosis Date Noted  . Upper airway cough syndrome 09/16/2014  . 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 Lung consolidation 05/25/2014  . Cough 05/25/2014   Renford Dills, SPT This entire session was performed under direct supervision and direction of a licensed therapist/therapist assistant . I have personally read, edited and approve of the note as written. Gorden Harms. Tortorici, PT, DPT [phone]  Tortorici,Ashley 11/30/2014, 1:45 PM  [place] MAIN [facility] SERVICES [address] [place], [place], [address] Phone: [phone]   Fax:  [phone]

## 2014-12-02 ENCOUNTER — Ambulatory Visit: Payer: Medicare Other

## 2014-12-02 DIAGNOSIS — R2681 Unsteadiness on feet: Secondary | ICD-10-CM | POA: Diagnosis not present

## 2014-12-02 DIAGNOSIS — M5442 Lumbago with sciatica, left side: Secondary | ICD-10-CM

## 2014-12-02 NOTE — Therapy (Signed)
[place] MAIN [facility] SERVICES [address] [place], [place], [address] Phone: [phone]   Fax:  [phone]  Physical Therapy Treatment  Patient Details  Name: Ashley Bray MRN: [id number] Date of Birth: 1940-12-17 Referring Provider:  Emmaline Kluver.,*  Encounter Date: 12/02/2014      PT End of Session - 12/02/14 0928    Visit Number 12   Number of Visits 17   Date for PT Re-Evaluation 12/21/14   Authorization Type 4/10   PT Start Time 0845   PT Stop Time 0925   PT Time Calculation (min) 40 min   Activity Tolerance Patient tolerated treatment well   Behavior During Therapy [facility] for tasks assessed/performed      Past Medical History  Diagnosis Date  . Diabetes   . Hypertension   . HLD (hyperlipidemia)   . RA (rheumatoid arthritis)   . Breast CA     left  . 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 Replacement total knee bilateral      x2  . Rotator cuff repair Right   . Carpal tunnel release Bilateral   . Neck fusion    . Appendectomy    . Abdominal hysterectomy  1991  . Breast lumpectomy    . Breast biopsy Left 08/31/2013    positive    There were no vitals filed for this visit.  Visit Diagnosis:  Unsteadiness on feet  Left-sided low back pain with left-sided sciatica      Subjective Assessment - 12/02/14 0927    Subjective pt reports her foot got caught on carpet this morning with her foot drop and she had a fall. she was not injured but is a little sore.    Patient Stated Goals want to be pain free in her left leg   Currently in Pain? Yes   Pain Score 2    Pain Location --  back and knees      Therex MMT: L ankle DF 4-/5 Sitting on T ball balance then weight shift x 3 min Sitting on Tball alt UE lift 2x10 Sitting on Tball pavloff press- yellow band 2x10 each side  Supine modified reverse crunch  2x10 Supine ball iso crunch 2x10 Low row Red band x 10 Shoulder diagonals- yellow band 2x10 mod-tactile cues  Pt required mod cues to maintain diaphragmatic breathing and for proper exercise technique.                             PT Education - 12/02/14 [phone]    Education provided Yes   Education Details ice to knees as needed. explained prognosis of neuroplasticity regarding her foot drop   Person(s) Educated Patient   Methods Explanation   Comprehension Verbalized understanding             PT Long Term Goals - 11/23/14 1029    PT LONG TERM GOAL #1   Title pt will be able ambulate with gait speed at least 0.8 m/s in order to be full community ambulator   Baseline achieved on 11/23/14 with 1.04 m/s (normal pace) and 1.24 m/s (fast pace)   Time 4   Period Weeks   Status Achieved   PT LONG TERM GOAL #2   Title pt will be able to ambulate at least 200  ft with pain less than 4/10 in order return to prior level of function    Baseline pt ambulated 1335 ft with 2/10 pain    Time 4   Period Weeks   Status Achieved   PT LONG TERM GOAL #3   Title pt will demonstrate at least 3/5 dorsiflexion MMT in left foot in order to improve foot clearance during gait   Baseline pt has 2+/5 dorsiflexoin on eval and 3-/5 MMT on 11/23/14   Time 4   Period Weeks   Status On-going   PT LONG TERM GOAL #4   Title pt will be able to ascend descend 4 steps with step over step pattern with less than 2/10 pain    Baseline pt reports she afraid of performing stair negotiation with step over pattern without use of rail    Time 4   Period Weeks   Status New               Plan - 12/02/14 4098    Clinical Impression Statement pt demonstrates improved dorsiflexion strength (L4) on the L upon exam (4-/5) although she does have foot drop when ambulating. did discuss possible future use of AFO if she continues to catch her toe to prevent falls. pt needed min-mod cues for  diaphragmatic breathing during session.    Pt will benefit from skilled therapeutic intervention in order to improve on the following deficits Abnormal gait;Postural dysfunction;Decreased strength;Decreased mobility;Decreased balance;Pain;Decreased activity tolerance   Rehab Potential Good   PT Frequency 2x / week   PT Duration 4 weeks   PT Treatment/Interventions Aquatic Therapy;Biofeedback;Cryotherapy;Electrical Stimulation;Moist Heat;Therapeutic exercise;Therapeutic activities;Functional mobility training;Stair training;Gait training;Balance training;Neuromuscular re-education;Manual techniques;Traction;Dry needling   PT Next Visit Plan progress HEP        Problem List Patient Active Problem List   Diagnosis Date Noted  . Upper airway cough syndrome 09/16/2014  . 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 Lung consolidation 05/25/2014  . Cough 05/25/2014   Gorden Harms. Ruthellen Tippy, PT, DPT [phone]  [name] 12/02/2014, 10:14 AM  [place] MAIN [facility] SERVICES [address] [place], [place], [address] Phone: [phone]   Fax:  [phone]

## 2014-12-07 ENCOUNTER — Ambulatory Visit: Payer: Medicare Other

## 2014-12-07 DIAGNOSIS — M5442 Lumbago with sciatica, left side: Secondary | ICD-10-CM

## 2014-12-07 DIAGNOSIS — R269 Unspecified abnormalities of gait and mobility: Secondary | ICD-10-CM

## 2014-12-07 DIAGNOSIS — R2681 Unsteadiness on feet: Secondary | ICD-10-CM | POA: Diagnosis not present

## 2014-12-07 NOTE — Therapy (Signed)
[place] MAIN [facility] SERVICES [place], [place], [address] Phone: [phone]   Fax:  [phone]  Physical Therapy Treatment  Patient Details  Name: Ashley Bray MRN: [id number] Date of Birth: [birth year] Referring Provider:  Emmaline Kluver.,*  Encounter Date: 12/07/2014      PT End of Session - 12/07/14 0801    Visit Number 13   Number of Visits 17   Date for PT Re-Evaluation 12/21/14   Authorization Type 5/10   PT Start Time 0800   PT Stop Time 0845   PT Time Calculation (min) 45 min   Activity Tolerance Patient tolerated treatment well   Behavior During Therapy [facility] for tasks assessed/performed      Past Medical History  Diagnosis Date  . Diabetes   . Hypertension   . HLD (hyperlipidemia)   . RA (rheumatoid arthritis)   . Breast CA     left  . 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 Replacement total knee bilateral      x2  . Rotator cuff repair Right   . Carpal tunnel release Bilateral   . Neck fusion    . Appendectomy    . Abdominal hysterectomy  1991  . Breast lumpectomy    . Breast biopsy Left 08/31/2013    positive    There were no vitals filed for this visit.  Visit Diagnosis:  Unsteadiness on feet  Left-sided low back pain with left-sided sciatica  Abnormality of gait  Gait abnormality      Subjective Assessment - 12/07/14 1409    Subjective Pt is still sore from her fall last Thursday and her left ankle on the lateral aspect has been hurting again.  Pt is considering canceling her membership to her gym to fear of falling while being alone. She is considering using an AFO if her drop foot continues to impact or gait.  Pt has most of her pain in her neck currently and thinks it is due to discontinued use of prednisone.      Patient Stated Goals want to be pain free in her left leg   Currently in Pain? Yes   Pain Score --  no number provided    Pain Location Neck   Pain Orientation Mid         Therex Sitting on T ball balance with marchingt x 3 min Sitting on Tball alt UE lift and marching  2x10 Sitting on Tball pavloff press- yellow band 2x10 each side  Supine iso crunch ( pressing elbows into table) 2x10 Wall slide with lift x10 Bridge on T ball, pt required verbal cueing to increase knee flexion while performing exercise to increase gluteal activation  Side stepping (monster walks) with blue band in //bars x4 laps   Pt required mod cues to maintain diaphragmatic breathing and for proper exercise technique.                           PT Education - 12/07/14 1409    Education provided Yes   Education Details plan of care, new there exercises    Person(s) Educated Patient   Methods Explanation;Demonstration   Comprehension Verbalized understanding;Returned demonstration             PT Long Term Goals - 11/23/14 1029  PT LONG TERM GOAL #1   Title pt will be able ambulate with gait speed at least 0.8 m/s in order to be full community ambulator   Baseline achieved on 11/23/14 with 1.04 m/s (normal pace) and 1.24 m/s (fast pace)   Time 4   Period Weeks   Status Achieved   PT LONG TERM GOAL #2   Title pt will be able to ambulate at least 200 ft with pain less than 4/10 in order return to prior level of function    Baseline pt ambulated 1335 ft with 2/10 pain    Time 4   Period Weeks   Status Achieved   PT LONG TERM GOAL #3   Title pt will demonstrate at least 3/5 dorsiflexion MMT in left foot in order to improve foot clearance during gait   Baseline pt has 2+/5 dorsiflexoin on eval and 3-/5 MMT on 11/23/14   Time 4   Period Weeks   Status On-going   PT LONG TERM GOAL #4   Title pt will be able to ascend descend 4 steps with step over step pattern with less than 2/10 pain    Baseline pt reports she afraid of performing stair  negotiation with step over pattern without use of rail    Time 4   Period Weeks   Status New               Plan - 12/07/14 1415    Clinical Impression Statement Pt responded well to core strengthening progression with no increase in pain.  pt requires min-mod cues to correctly perform exercises    Pt will benefit from skilled therapeutic intervention in order to improve on the following deficits Abnormal gait;Postural dysfunction;Decreased strength;Decreased mobility;Decreased balance;Pain;Decreased activity tolerance   Rehab Potential Good   PT Frequency 2x / week   PT Duration 4 weeks   PT Treatment/Interventions Aquatic Therapy;Biofeedback;Cryotherapy;Electrical Stimulation;Moist Heat;Therapeutic exercise;Therapeutic activities;Functional mobility training;Stair training;Gait training;Balance training;Neuromuscular re-education;Manual techniques;Traction;Dry needling   PT Next Visit Plan progress HEP        Problem List Patient Active Problem List   Diagnosis Date Noted  . Upper airway cough syndrome 09/16/2014  . 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 Lung consolidation 05/25/2014  . Cough 05/25/2014   Renford Dills, SPT This entire session was performed under direct supervision and direction of a licensed therapist/therapist assistant . I have personally read, edited and approve of the note as written. Gorden Harms. Tortorici, PT, DPT [phone]  Tortorici,Ashley 12/07/2014, 4:37 PM  [place] MAIN [facility] SERVICES [address] [place], [place], [address] Phone: [phone]   Fax:  [phone]

## 2014-12-08 ENCOUNTER — Ambulatory Visit: Payer: Medicare Other

## 2014-12-08 DIAGNOSIS — M5442 Lumbago with sciatica, left side: Secondary | ICD-10-CM

## 2014-12-08 DIAGNOSIS — R269 Unspecified abnormalities of gait and mobility: Secondary | ICD-10-CM

## 2014-12-08 DIAGNOSIS — R2681 Unsteadiness on feet: Secondary | ICD-10-CM

## 2014-12-08 NOTE — Therapy (Signed)
[place] MAIN [facility] SERVICES [place], [place], [address] Phone: [phone]   Fax:  [phone]  Physical Therapy Treatment  Patient Details  Name: Ashley Bray MRN: [id number] Date of Birth: 19-May-1940 Referring Provider:  Emmaline Kluver.,*  Encounter Date: 12/08/2014      PT End of Session - 12/08/14 0846    Visit Number 14   Number of Visits 17   Date for PT Re-Evaluation 12/21/14   Authorization Type 6/10   PT Start Time 0845   PT Stop Time 0930   PT Time Calculation (min) 45 min   Activity Tolerance Patient tolerated treatment well   Behavior During Therapy [facility] for tasks assessed/performed      Past Medical History  Diagnosis Date  . Diabetes   . Hypertension   . HLD (hyperlipidemia)   . RA (rheumatoid arthritis)   . Breast CA     left  . 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 Replacement total knee bilateral      x2  . Rotator cuff repair Right   . Carpal tunnel release Bilateral   . Neck fusion    . Appendectomy    . Abdominal hysterectomy  1991  . Breast lumpectomy    . Breast biopsy Left 08/31/2013    positive    There were no vitals filed for this visit.  Visit Diagnosis:  Unsteadiness on feet  Left-sided low back pain with left-sided sciatica  Abnormality of gait  Gait abnormality      Subjective Assessment - 12/08/14 1616    Subjective Pt is "so afraid to walk now" since her fall from last week.  Pt has noticed she is "stabbing "her toe when walking to her exercise class.  She has noticed her pain has improved since the start of PT and denies any pain currently.     Patient Stated Goals want to be pain free in her left leg   Currently in Pain? No/denies   Pain Score 0-No pain       neuro re-ed: Marching in //bars x5 laps, with emphasis on maintaining single leg stance as  long as possible, pt required UE assist during L single leg stance Toe taps on on 4 inch step 2x10 Static standing balance on AIREX with UE movements x30 sec, with supervision  Stepping forward and back x10 each LE Stepping forward and back x10 each LE with instruction to perform as quick as possible, pt becomes unsteady and occasionally requires UE assist Side step and back x10 each LE  Side step  And back x 10 each LE  with instruction to perform as quick as possible, pt becomes unsteady and occasionally requires UE assist Step backwards and back x10 Step backwards and back x10 with instruction to perform as quick as possible, pt becomes unsteady and occasionally requires UE assist Step up on 4 inch step with single leg stance hold as long as possible(less than 2 sec on L LE) x10 each LE Side step on 4 inch step x 10 each LE Pt required close supervision and CGA throughout session for safety  Pt is unsteady during left LE single leg stance                           PT Education -  12/08/14 1617    Education provided Yes   Education Details plan of care, new exercises for HEP   Person(s) Educated Patient   Methods Explanation;Demonstration   Comprehension Verbalized understanding;Returned demonstration             PT Long Term Goals - 11/23/14 1029    PT LONG TERM GOAL #1   Title pt will be able ambulate with gait speed at least 0.8 m/s in order to be full community ambulator   Baseline achieved on 11/23/14 with 1.04 m/s (normal pace) and 1.24 m/s (fast pace)   Time 4   Period Weeks   Status Achieved   PT LONG TERM GOAL #2   Title pt will be able to ambulate at least 200 ft with pain less than 4/10 in order return to prior level of function    Baseline pt ambulated 1335 ft with 2/10 pain    Time 4   Period Weeks   Status Achieved   PT LONG TERM GOAL #3   Title pt will demonstrate at least 3/5 dorsiflexion MMT in left foot in order to improve foot  clearance during gait   Baseline pt has 2+/5 dorsiflexoin on eval and 3-/5 MMT on 11/23/14   Time 4   Period Weeks   Status On-going   PT LONG TERM GOAL #4   Title pt will be able to ascend descend 4 steps with step over step pattern with less than 2/10 pain    Baseline pt reports she afraid of performing stair negotiation with step over pattern without use of rail    Time 4   Period Weeks   Status New               Plan - 12/08/14 1621    Clinical Impression Statement Today's session focused on neuro-re ed exercises to improve her balance, especially single leg stance for dynamic functional movements.  Pt is unsteady during single leg stance, greater on the left versus right.  pt is initially hesitant on performing single leg activities because of fear of falling but her confidence improved with repetitions.   Pt has follow up with PCP next week. Pt will recommend AFO for use at this time due to foot drop to reduce fall risk.    Pt will benefit from skilled therapeutic intervention in order to improve on the following deficits Abnormal gait;Postural dysfunction;Decreased strength;Decreased mobility;Decreased balance;Pain;Decreased activity tolerance   Rehab Potential Good   PT Frequency 2x / week   PT Duration 4 weeks   PT Treatment/Interventions Aquatic Therapy;Biofeedback;Cryotherapy;Electrical Stimulation;Moist Heat;Therapeutic exercise;Therapeutic activities;Functional mobility training;Stair training;Gait training;Balance training;Neuromuscular re-education;Manual techniques;Traction;Dry needling   PT Next Visit Plan progress HEP        Problem List Patient Active Problem List   Diagnosis Date Noted  . Upper airway cough syndrome 09/16/2014  . 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 Lung consolidation 05/25/2014  . Cough 05/25/2014   Renford Dills, SPT This entire session was performed under direct supervision and direction of a licensed therapist/therapist assistant . I  have personally read, edited and approve of the note as written. Gorden Harms. Tortorici, PT, DPT [phone]  Tortorici,Ashley 12/09/2014, 11:52 AM  [place] MAIN [facility] SERVICES [address] [place], [place], [address] Phone: [phone]   Fax:  [phone]

## 2014-12-08 NOTE — Patient Instructions (Signed)
[email or web address] Marching in place 2x10 Toe taps on step at home 2x10

## 2014-12-10 ENCOUNTER — Other Ambulatory Visit: Payer: Self-pay | Admitting: *Deleted

## 2014-12-10 DIAGNOSIS — C50919 Malignant neoplasm of unspecified site of unspecified female breast: Secondary | ICD-10-CM

## 2014-12-15 ENCOUNTER — Ambulatory Visit: Payer: Medicare Other

## 2014-12-15 DIAGNOSIS — M5442 Lumbago with sciatica, left side: Secondary | ICD-10-CM

## 2014-12-15 DIAGNOSIS — R2681 Unsteadiness on feet: Secondary | ICD-10-CM | POA: Diagnosis not present

## 2014-12-15 DIAGNOSIS — R269 Unspecified abnormalities of gait and mobility: Secondary | ICD-10-CM

## 2014-12-15 NOTE — Patient Instructions (Signed)
[email or web address] Wall slides with lift 2x10 Shoulder rows with red band 2x10

## 2014-12-15 NOTE — Therapy (Signed)
[place] MAIN [facility] SERVICES [place], [place], [address] Phone: [phone]   Fax:  [phone]  Physical Therapy Treatment  Patient Details  Name: Ashley Bray MRN: [id number] Date of Birth: March 08, 1941 Referring Provider:  Emmaline Kluver.,*  Encounter Date: 12/15/2014      PT End of Session - 12/15/14 1230    Visit Number 15   Number of Visits 17   Date for PT Re-Evaluation 12/21/14   Authorization Type 7/10   PT Start Time 1100   PT Stop Time 1145   PT Time Calculation (min) 45 min   Activity Tolerance Patient tolerated treatment well   Behavior During Therapy [facility] for tasks assessed/performed      Past Medical History  Diagnosis Date  . Diabetes   . Hypertension   . HLD (hyperlipidemia)   . RA (rheumatoid arthritis)   . Breast CA     left  . 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 Replacement total knee bilateral      x2  . Rotator cuff repair Right   . Carpal tunnel release Bilateral   . Neck fusion    . Appendectomy    . Abdominal hysterectomy  1991  . Breast lumpectomy    . Breast biopsy Left 08/31/2013    positive    There were no vitals filed for this visit.  Visit Diagnosis:  Unsteadiness on feet  Left-sided low back pain with left-sided sciatica  Abnormality of gait  Gait abnormality      Subjective Assessment - 12/15/14 1228    Subjective Pt states it's been "a pretty good week" and has had minimal back pain but has been having neck pain after discontinued use of prednisone and is taking Mobic for neck pain.  pt reports when she does have pain she performs her exercises which relieves her pain.     Patient Stated Goals want to be pain free in her left leg   Pain Score 5    Pain Location Neck        Therex Sitting on T ball balance with marching x 3 min Wall slide with lift  2x10 Row rows with red band 2x10 Side stepping (monster walks) with red band in //bars x4 laps Squats in //bars on AIREX 2X10 Pavlov press with red band in standing x10 each side Marching in //bars x4 laps, with emphasis on maintaining single leg stance as long as possible, pt required UE assist during L single leg stance Pt required close supervision and CGA throughout session for safety   Pt is unsteady during left LE single leg stance   Pt required verbal and visual cues for correct exercise technique Pt required min verbal and tactile cues from increasing forward head posture and shrugging shoulders during exercises.   Pt noted decreased pain and tension in her neck at the end of the session.                              PT Education - 12/15/14 1230    Education provided Yes   Education Details plan of care, posture and new exercises for HEP   Person(s) Educated Patient   Methods Explanation   Comprehension Verbalized understanding  PT Long Term Goals - 11/23/14 1029    PT LONG TERM GOAL #1   Title pt will be able ambulate with gait speed at least 0.8 m/s in order to be full community ambulator   Baseline achieved on 11/23/14 with 1.04 m/s (normal pace) and 1.24 m/s (fast pace)   Time 4   Period Weeks   Status Achieved   PT LONG TERM GOAL #2   Title pt will be able to ambulate at least 200 ft with pain less than 4/10 in order return to prior level of function    Baseline pt ambulated 1335 ft with 2/10 pain    Time 4   Period Weeks   Status Achieved   PT LONG TERM GOAL #3   Title pt will demonstrate at least 3/5 dorsiflexion MMT in left foot in order to improve foot clearance during gait   Baseline pt has 2+/5 dorsiflexoin on eval and 3-/5 MMT on 11/23/14   Time 4   Period Weeks   Status On-going   PT LONG TERM GOAL #4   Title pt will be able to ascend descend 4 steps with step over step pattern with less than 2/10 pain    Baseline pt  reports she afraid of performing stair negotiation with step over pattern without use of rail    Time 4   Period Weeks   Status New               Plan - 12/15/14 1232    Clinical Impression Statement pt is progressing well with PT and did not experience any increase in back pain throughout session. pt noted decreased pain and tension in her neck at the end of the session.  pt given a referral to give to her MD for an AFO.       Pt will benefit from skilled therapeutic intervention in order to improve on the following deficits Abnormal gait;Postural dysfunction;Decreased strength;Decreased mobility;Decreased balance;Pain;Decreased activity tolerance   Rehab Potential Good   PT Frequency 2x / week   PT Duration 4 weeks   PT Treatment/Interventions Aquatic Therapy;Biofeedback;Cryotherapy;Electrical Stimulation;Moist Heat;Therapeutic exercise;Therapeutic activities;Functional mobility training;Stair training;Gait training;Balance training;Neuromuscular re-education;Manual techniques;Traction;Dry needling   PT Next Visit Plan progress HEP        Problem List Patient Active Problem List   Diagnosis Date Noted  . Upper airway cough syndrome 09/16/2014  . 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 Lung consolidation 05/25/2014  . Cough 05/25/2014   Renford Dills, SPT This entire session was performed under direct supervision and direction of a licensed therapist/therapist assistant . I have personally read, edited and approve of the note as written. Gorden Harms. Tortorici, PT, DPT [phone]  Tortorici,Ashley 12/15/2014, 2:11 PM  [place] MAIN [facility] SERVICES [address] [place], [place], [address] Phone: [phone]   Fax:  [phone]

## 2014-12-16 ENCOUNTER — Inpatient Hospital Stay: Payer: Medicare Other

## 2014-12-16 ENCOUNTER — Inpatient Hospital Stay: Payer: Medicare Other | Admitting: Oncology

## 2014-12-17 IMAGING — CR DG KNEE 1-2V*R*
1 series · 3 of 3 positions shown · non-contrast
Comparison: none

REASON FOR EXAM: post op
COMMENTS:   Bedside (portable):Y

[Series 1: ap · 0.17mm/px · 3 of 3 slices shown]
[im 1/3]
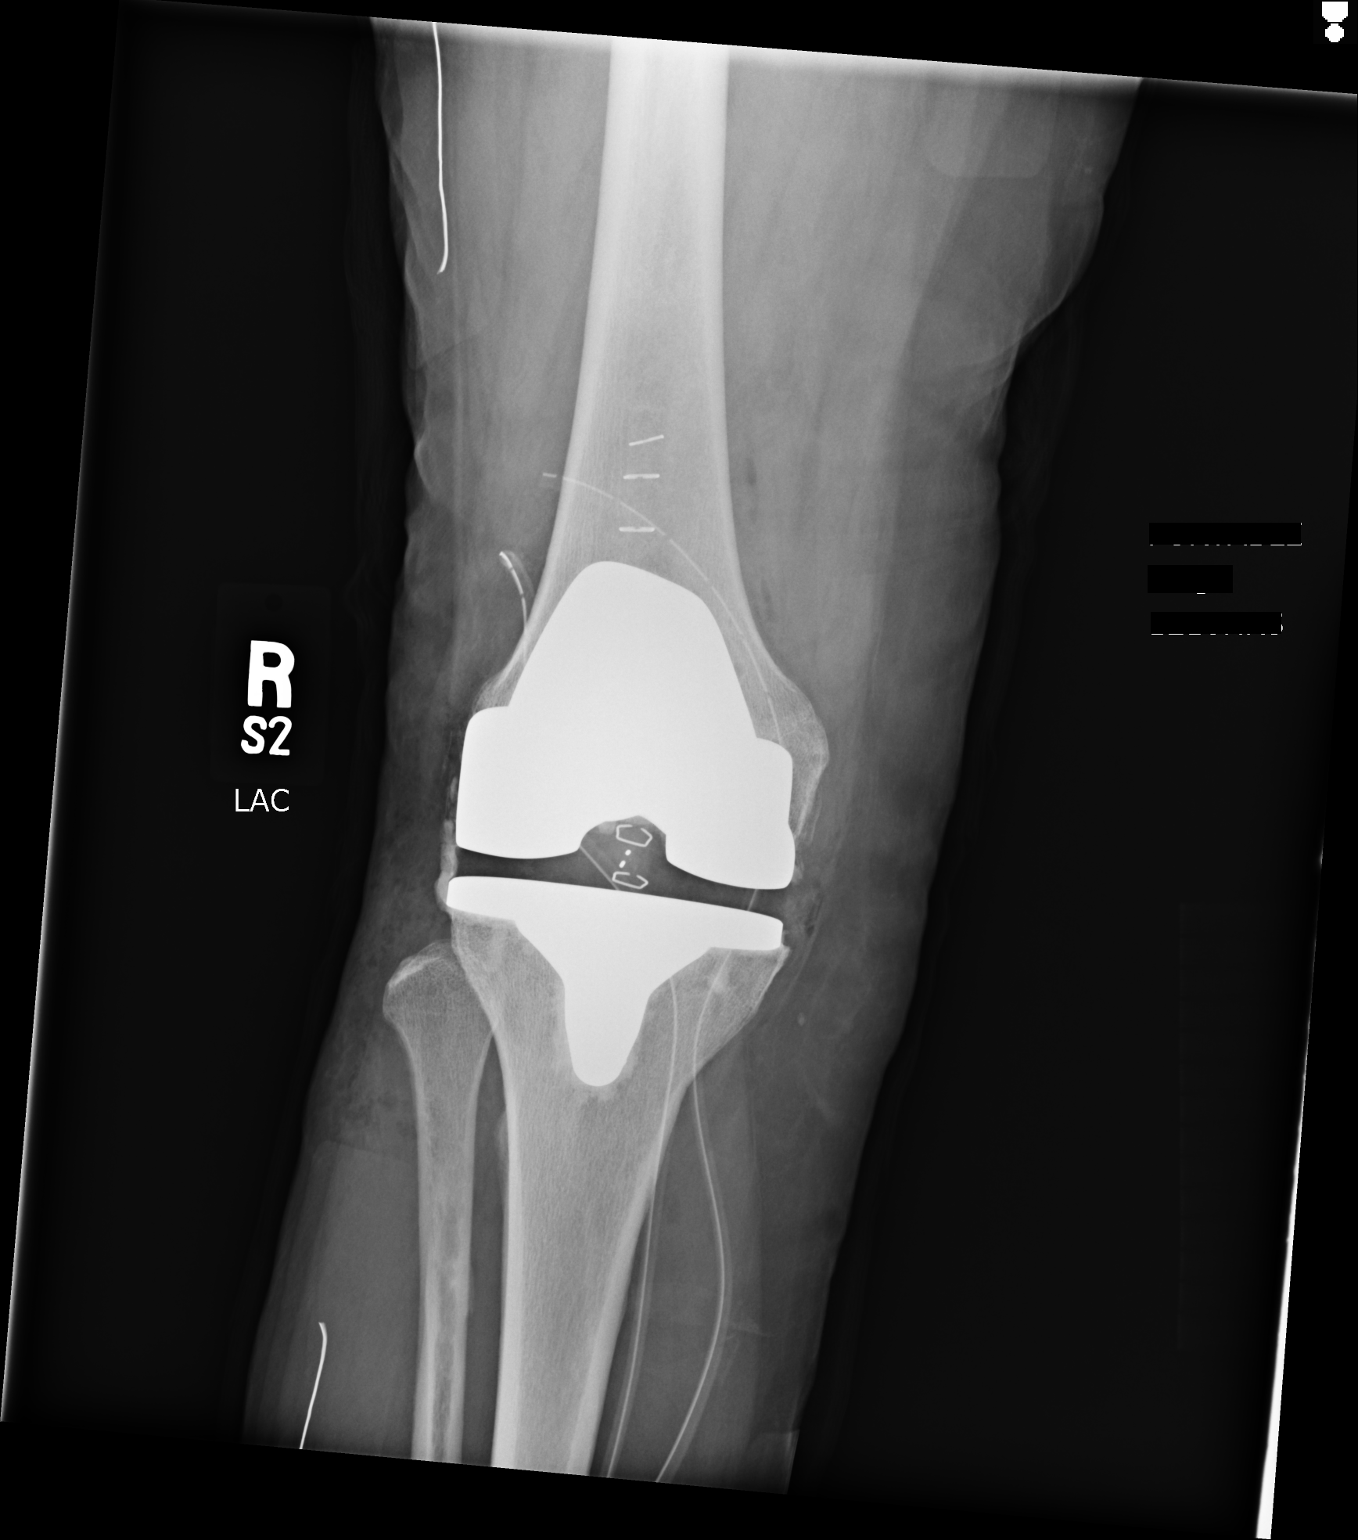
[im 2/3]
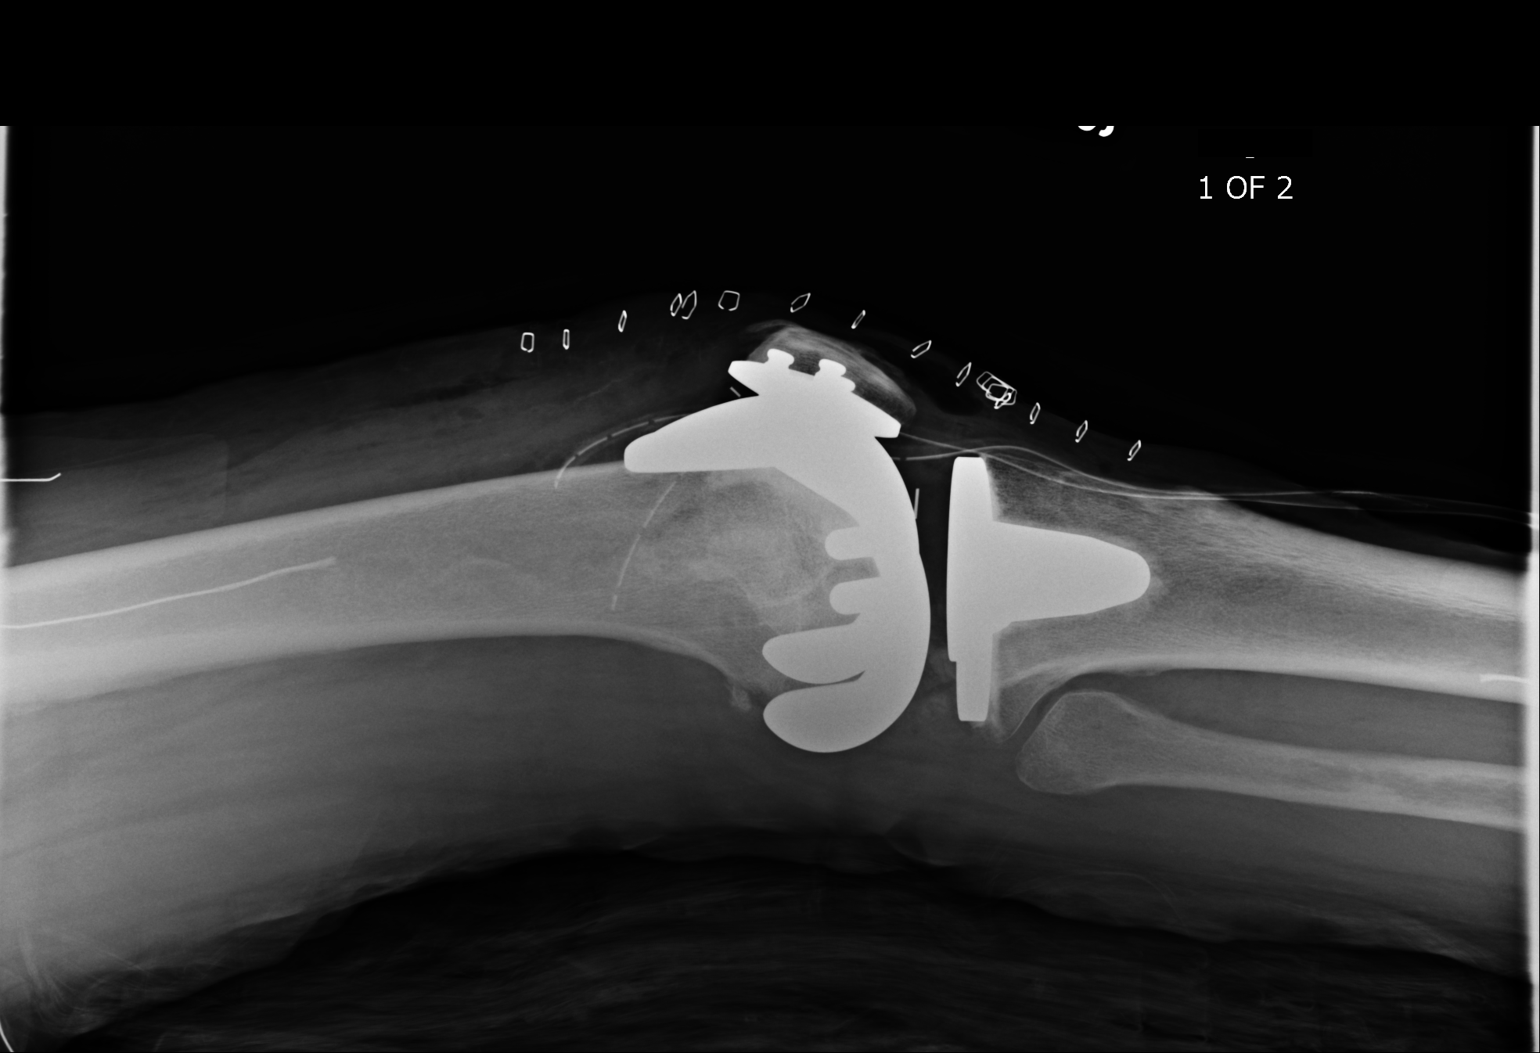
[im 3/3]
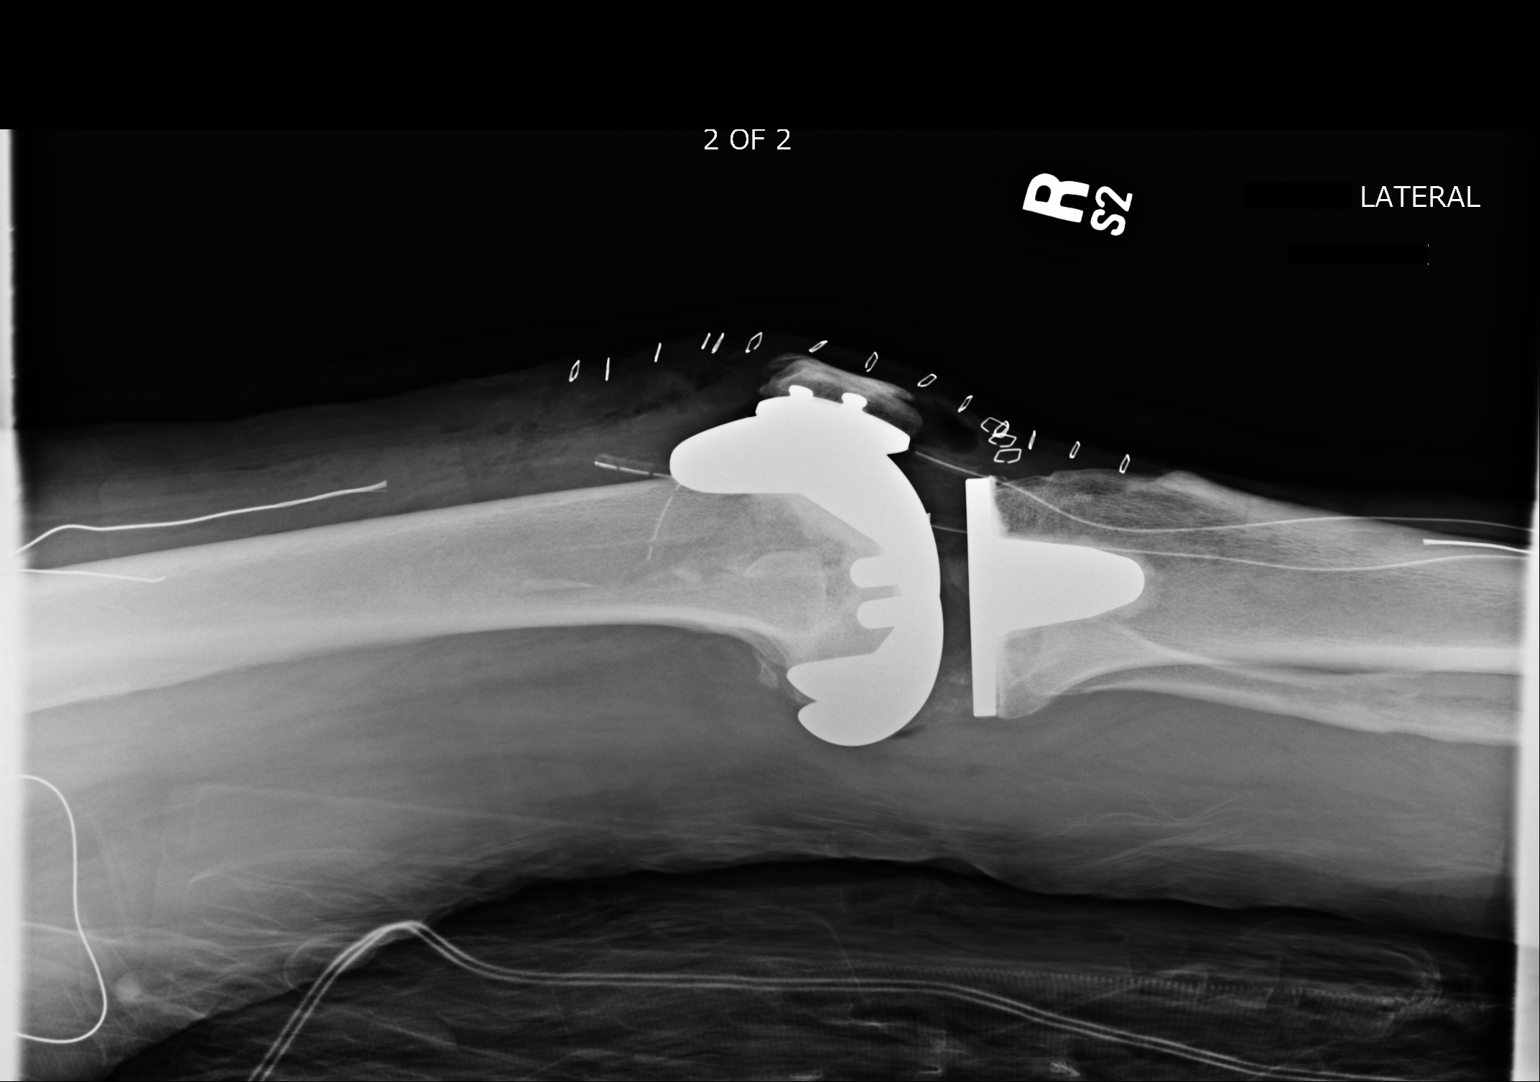

[3 of 3 positions shown; findings below may reference images not displayed]

PROCEDURE:     DXR - DXR KNEE RIGHT AP AND LATERAL  - April 01, 2012 [DATE]

RESULT:     AP and lateral portable views of the right knee reveal the
patient to have undergone total joint replacement. Radiographic positioning
of the prosthetic components is good. Surgical drain lines and skin staples
are present.
IMPRESSION: The patient has undergone right total knee joint
replacement. Further interpretation is deferred to Dr. Yedla.

[REDACTED]

## 2014-12-21 ENCOUNTER — Ambulatory Visit: Payer: Medicare Other | Attending: Rheumatology

## 2014-12-21 DIAGNOSIS — R2681 Unsteadiness on feet: Secondary | ICD-10-CM | POA: Insufficient documentation

## 2014-12-21 DIAGNOSIS — R269 Unspecified abnormalities of gait and mobility: Secondary | ICD-10-CM | POA: Diagnosis present

## 2014-12-21 DIAGNOSIS — M5442 Lumbago with sciatica, left side: Secondary | ICD-10-CM

## 2014-12-22 ENCOUNTER — Inpatient Hospital Stay (HOSPITAL_BASED_OUTPATIENT_CLINIC_OR_DEPARTMENT_OTHER): Payer: Medicare Other | Admitting: Oncology

## 2014-12-22 ENCOUNTER — Inpatient Hospital Stay: Payer: Medicare Other | Attending: Oncology

## 2014-12-22 VITALS — BP 159/70 | HR 71 | Temp 96.9°F | Wt 127.0 lb

## 2014-12-22 DIAGNOSIS — Z79811 Long term (current) use of aromatase inhibitors: Secondary | ICD-10-CM

## 2014-12-22 DIAGNOSIS — E785 Hyperlipidemia, unspecified: Secondary | ICD-10-CM | POA: Diagnosis not present

## 2014-12-22 DIAGNOSIS — Z170 Estrogen receptor positive status [ER+]: Secondary | ICD-10-CM | POA: Insufficient documentation

## 2014-12-22 DIAGNOSIS — Z79899 Other long term (current) drug therapy: Secondary | ICD-10-CM | POA: Diagnosis not present

## 2014-12-22 DIAGNOSIS — E119 Type 2 diabetes mellitus without complications: Secondary | ICD-10-CM | POA: Diagnosis not present

## 2014-12-22 DIAGNOSIS — Z923 Personal history of irradiation: Secondary | ICD-10-CM | POA: Insufficient documentation

## 2014-12-22 DIAGNOSIS — Z794 Long term (current) use of insulin: Secondary | ICD-10-CM

## 2014-12-22 DIAGNOSIS — M069 Rheumatoid arthritis, unspecified: Secondary | ICD-10-CM

## 2014-12-22 DIAGNOSIS — D0592 Unspecified type of carcinoma in situ of left breast: Secondary | ICD-10-CM

## 2014-12-22 DIAGNOSIS — C50912 Malignant neoplasm of unspecified site of left female breast: Secondary | ICD-10-CM

## 2014-12-22 DIAGNOSIS — I10 Essential (primary) hypertension: Secondary | ICD-10-CM

## 2014-12-22 DIAGNOSIS — C50919 Malignant neoplasm of unspecified site of unspecified female breast: Secondary | ICD-10-CM

## 2014-12-22 LAB — COMPREHENSIVE METABOLIC PANEL
ALT: 18 U/L (ref 14–54)
AST: 31 U/L (ref 15–41)
Albumin: 4.2 g/dL (ref 3.5–5.0)
Alkaline Phosphatase: 49 U/L (ref 38–126)
Anion gap: 6 (ref 5–15)
BUN: 12 mg/dL (ref 6–20)
CHLORIDE: 97 mmol/L — AB (ref 101–111)
CO2: 29 mmol/L (ref 22–32)
Calcium: 8.7 mg/dL — ABNORMAL LOW (ref 8.9–10.3)
Creatinine, Ser: 0.63 mg/dL (ref 0.44–1.00)
GFR calc Af Amer: >60 mL/min (ref >60)
GFR calc non Af Amer: >60 mL/min (ref >60)
Glucose, Bld: 143 mg/dL — ABNORMAL HIGH (ref 65–99)
Potassium: 4.3 mmol/L (ref 3.5–5.1)
SODIUM: 132 mmol/L — AB (ref 135–145)
Total Bilirubin: 1 mg/dL (ref 0.3–1.2)
Total Protein: 7.1 g/dL (ref 6.5–8.1)

## 2014-12-22 LAB — CBC WITH DIFFERENTIAL/PLATELET
BASOS ABS: 0 10*3/uL (ref 0–0.1)
Basophils Relative: 1 %
EOS PCT: 2 %
Eosinophils Absolute: 0.1 10*3/uL (ref 0–0.7)
HCT: 39.1 % (ref 35.0–47.0)
Hemoglobin: 13.3 g/dL (ref 12.0–16.0)
Lymphocytes Relative: 11 %
Lymphs Abs: 0.7 10*3/uL — ABNORMAL LOW (ref 1.0–3.6)
MCH: 31.7 pg (ref 26.0–34.0)
MCHC: 33.9 g/dL (ref 32.0–36.0)
MCV: 93.6 fL (ref 80.0–100.0)
Monocytes Absolute: 0.5 10*3/uL (ref 0.2–0.9)
Monocytes Relative: 7 %
Neutro Abs: 5.5 10*3/uL (ref 1.4–6.5)
Neutrophils Relative %: 79 %
PLATELETS: 266 10*3/uL (ref 150–440)
RBC: 4.18 MIL/uL (ref 3.80–5.20)
RDW: 12.4 % (ref 11.5–14.5)
WBC: 6.9 10*3/uL (ref 3.6–11.0)

## 2014-12-22 MED ORDER — TAMOXIFEN CITRATE 20 MG PO TABS: 20.0000 mg | ORAL_TABLET | Freq: Every day | ORAL | Status: DC

## 2014-12-22 NOTE — Patient Instructions (Signed)
[email or web address] Toe taps 2x10 each LE SLS on each LE 10 x5-10 sec

## 2014-12-22 NOTE — Therapy (Signed)
[place] MAIN [facility] SERVICES [address] [place], [place], [address] Phone: [phone]   Fax:  [phone]  Physical Therapy Treatment/Discharge Note 9/6-10/06/2014  Patient Details  Name: Ashley Bray MRN: [id number] Date of Birth: 18-Sep-1940 Referring Provider:  Emmaline Kluver.,*  Encounter Date: 12/21/2014      PT End of Session - 12/22/14 0828    Visit Number 16   Number of Visits 17   Date for PT Re-Evaluation 12/21/14   Authorization Type 8/10   PT Start Time 0800   PT Stop Time 0845   PT Time Calculation (min) 45 min   Activity Tolerance Patient tolerated treatment well   Behavior During Therapy [facility] for tasks assessed/performed      Past Medical History  Diagnosis Date  . Diabetes ([place])   . Hypertension   . HLD (hyperlipidemia)   . RA (rheumatoid arthritis) ([place])   . Breast CA ([place])     left  . 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 Replacement total knee bilateral      x2  . Rotator cuff repair Right   . Carpal tunnel release Bilateral   . Neck fusion    . Appendectomy    . Abdominal hysterectomy  1991  . Breast lumpectomy    . Breast biopsy Left 08/31/2013    positive    There were no vitals filed for this visit.  Visit Diagnosis:  Unsteadiness on feet  Left-sided low back pain with left-sided sciatica  Gait abnormality      Subjective Assessment - 12/22/14 0827    Subjective Pt relates she has been doing well and has not experienced any falls.  Pt denies any pain currently and reports when she does have back and or left LE pain she is able to decrease her pain with her current HEP.  Pt has an MD appointment tomorrow and will seek approval from her MD for an AFO to decrease foot drop.    Patient Stated Goals want to be pain free in her left leg   Currently in Pain? No/denies   Pain Score  0-No pain         There ex: Side stepping with red band above knees x 4 laps in //bars Shoulder extension with yellow band 2x10 Shoulder rows with red band 2x10 Pavlov press in standing with red band x10 each side Pavlov press in standing with green band x10 each side Pt required verbal and visual cues for correct exercise technique   Neuro re-ed Maintain static balance while placing one foot on 6inch step plus AIREX 5X10 sec each  SLS, with instruction to maintain SLS as long as possible x10 each LE, decreased time on L LE Toe taps on 6 inch step x10 each LE Toe taps on 6 inch step x10 each LE with instruction to perform exercise as fast as possible, pt becomes unsteady and occasionally requires UE assist pt required close supervision for safety and verbal cues for correct exercise technique                         PT Education - 12/22/14 0828    Education provided Yes   Education Details plan of care, reviewed HEP    Person(s) Educated Patient   Methods Explanation   Comprehension Verbalized  understanding             PT Long Term Goals - 31-Dec-2014 0830    PT LONG TERM GOAL #1   Title pt will be able ambulate with gait speed at least 0.8 m/s in order to be full community ambulator   Baseline achieved on 11/23/14 with 1.04 m/s (normal pace) and 1.24 m/s (fast pace)   Time 4   Period Weeks   Status Achieved   PT LONG TERM GOAL #2   Title pt will be able to ambulate at least 200 ft with pain less than 4/10 in order return to prior level of function    Baseline pt ambulated 1335 ft with 2/10 pain    Time 4   Period Weeks   Status Achieved   PT LONG TERM GOAL #3   Title pt will demonstrate at least 3/5 dorsiflexion MMT in left foot in order to improve foot clearance during gait   Baseline pt has 2+/5 dorsiflexoin on eval and 3-/5 MMT on 11/23/14   Time 4   Period Weeks   Status On-going   PT LONG TERM GOAL #4   Title pt will be able to ascend descend  4 steps with step over step pattern with less than 2/10 pain    Baseline pt is able to ascend and sescend 4 steps with step over step pattern with no pain and with use of rails    Time 4   Period Weeks   Status Partially Met               Plan - 12-31-14 0829    Clinical Impression Statement Pt has made significant improvements with PT and has met or partially met all her goals.  Pt notes she is pleased with accomplishing her primary goal of decreasing her back and L LE pain and is able to manage her symptoms with exercise.  Pt continues to occasionally ambulate with decreased left foot clearance primarily from decreased ankle dorsiflexion strength.  Pt will be discharged at this with an HEP to maintain and improve gains made in PT and with recommendation for a AFO to decrease foot drop and decrease risk of future falls.     Pt will benefit from skilled therapeutic intervention in order to improve on the following deficits Abnormal gait;Postural dysfunction;Decreased strength;Decreased mobility;Decreased balance;Pain;Decreased activity tolerance   Rehab Potential Good   PT Frequency 2x / week   PT Duration 4 weeks   PT Treatment/Interventions Aquatic Therapy;Biofeedback;Cryotherapy;Electrical Stimulation;Moist Heat;Therapeutic exercise;Therapeutic activities;Functional mobility training;Stair training;Gait training;Balance training;Neuromuscular re-education;Manual techniques;Traction;Dry needling          G-Codes - 12/31/14 0831    Functional Assessment Tool Used clinical judgment, history outcome measures    Functional Limitation Mobility: Walking and moving around   Mobility: Walking and Moving Around Current Status (J7366) At least 1 percent but less than 20 percent impaired, limited or restricted   Mobility: Walking and Moving Around Goal Status [phone]) At least 1 percent but less than 20 percent impaired, limited or restricted   Mobility: Walking and Moving Around Discharge  Status [phone]) At least 1 percent but less than 20 percent impaired, limited or restricted      Problem List Patient Active Problem List   Diagnosis Date Noted  . Upper airway cough syndrome 09/16/2014  . 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 Lung consolidation ([place]) 05/25/2014  . Cough 05/25/2014   Renford Dills, SPT This entire session was performed under direct supervision and  direction of a licensed Chiropractor . I have personally read, edited and approve of the note as written. Gorden Harms. Tortorici, PT, DPT [phone]  Tortorici,Ashley 12/22/2014, 10:24 AM  [place] MAIN [facility] SERVICES [address] [place], [place], [address] Phone: [phone]   Fax:  [phone]

## 2014-12-22 NOTE — Progress Notes (Signed)
Patient does have living will. Never smoked. 

## 2014-12-25 ENCOUNTER — Encounter: Payer: Self-pay | Admitting: Oncology

## 2014-12-25 DIAGNOSIS — E119 Type 2 diabetes mellitus without complications: Secondary | ICD-10-CM | POA: Insufficient documentation

## 2014-12-25 NOTE — Progress Notes (Signed)
[place] @ [facility] [phone]  [phone] [place]: Oct 06, 1940  MR#: [id number]  ATF#:[id number]  Patient Care Team: Madelyn Brunner, MD as PCP - General (Internal Medicine) Madelyn Brunner, MD (Internal Medicine)  CHIEF COMPLAINT:  Chief Complaint/Diagnosis:   74 year old female with stage 0 (Tis N0 M0) ductal carcinoma in situ of the left breast status post wide local excision for adjuvant radiation therapy.(August, 2015) estrogen receptor is positive.  Progesterone receptor is positive. patient was taken off tamoxifen because of unspecified bone pain on February 1, 201 6 2.abnormal CT scan of the chest (March, 2016)Bronchoscopy was done which revealed no evidence of malignancy.  All the cultures are negative.  2.  Radiation pneumonitis which has been treated with steroid and has completely resolved as per pulmonologist note   INTERVAL HISTORY:  74 year old lady with history of carcinoma of breast.  Stage 0 ductal carcinoma in situ patient is off letrozole because of radiation pneumonitis which is completely resolved.  Patient is here for ongoing evaluation and further follow-up.  No cough no shortness of breath.  No chills.  No fever. Last CT scans shows complete resolution of pulmonary infiltrate Patient is off steroids REVIEW OF SYSTEMS:   GENERAL:  Feels good.  Active.  No fevers, sweats or weight loss. PERFORMANCE STATUS (ECOG):  0 HEENT:  No visual changes, runny nose, sore throat, mouth sores or tenderness. Lungs: No shortness of breath or cough.  No hemoptysis. Cardiac:  No chest pain, palpitations, orthopnea, or PND. GI:  No nausea, vomiting, diarrhea, constipation, melena or hematochezia. GU:  No urgency, frequency, dysuria, or hematuria. Musculoskeletal:  No back pain.  No joint pain.  No muscle tenderness. Extremities:  No pain or swelling. Skin:  No rashes or skin changes. Neuro:  No headache, numbness or weakness, balance or  coordination issues. Endocrine:  No diabetes, thyroid issues, hot flashes or night sweats. Psych:  No mood changes, depression or anxiety. Pain:  No focal pain. Review of systems:  All other systems reviewed and found to be negative. As per HPI. Otherwise, a complete review of systems is negatve.  PAST MEDICAL HISTORY: Past Medical History  Diagnosis Date  . Diabetes ([place])   . Hypertension   . HLD (hyperlipidemia)   . RA (rheumatoid arthritis) ([place])   . Breast CA ([place])     left  . 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 Replacement total knee bilateral      x2  . Rotator cuff repair Right   . Carpal tunnel release Bilateral   . Neck fusion    . Appendectomy    . Abdominal hysterectomy  1991  . Breast lumpectomy    . Breast biopsy Left 08/31/2013    positive    FAMILY HISTORY Family History  Problem Relation Age of Onset  . Cancer Mother     kidney or bladder-unsure of which one  . Cancer Brother 75    lung  . Cancer Brother 22    lung    ADVANCED DIRECTIVES:  No flowsheet data found.  HEALTH MAINTENANCE: Social History  Substance Use Topics  . Smoking status: Never Smoker   . Smokeless tobacco: Never Used  . Alcohol Use: 1.2 oz/week    2 Glasses of wine per week  Comment: occasional wine      Allergies  Allergen Reactions  . Codeine Nausea And Vomiting    Current Outpatient Prescriptions  Medication Sig Dispense Refill  . benzonatate (TESSALON) 100 MG capsule Take 1 capsule by mouth 3 (three) times daily as needed.  0  . Blood Glucose Monitoring Suppl (ONE TOUCH ULTRA 2) W/DEVICE KIT   11  . cyanocobalamin 2000 MCG tablet Take 2,000 mcg by mouth daily.    . folic acid (FOLVITE) 1 MG tablet Take 1 mg by mouth daily.  11  . gabapentin (NEURONTIN) 100 MG capsule Take 100 mg by mouth 3 (three) times daily.    Marland Kitchen GLUCAGON  EMERGENCY 1 MG injection   0  . hydroxychloroquine (PLAQUENIL) 200 MG tablet Take 200 mg by mouth daily.    . insulin aspart (NOVOLOG) 100 UNIT/ML FlexPen Per Sliding scale    . metFORMIN (GLUCOPHAGE) 1000 MG tablet Take 1,000 mg by mouth 2 (two) times daily with a meal.   1  . montelukast (SINGULAIR) 10 MG tablet Take 1 tablet (10 mg total) by mouth at bedtime. 30 tablet 1  . pravastatin (PRAVACHOL) 40 MG tablet Take 40 mg by mouth daily.    . tamoxifen (NOLVADEX) 20 MG tablet Take 1 tablet (20 mg total) by mouth daily. 30 tablet 6   No current facility-administered medications for this visit.    OBJECTIVE:  Filed Vitals:   12/22/14 1302  BP: 159/70  Pulse: 71  Temp: 96.9 F (36.1 C)     Body mass index is 24.01 kg/(m^2).    ECOG FS:0 - Asymptomatic  PHYSICAL EXAM: General  status: Performance status is good.  Patient has not lost significant weight. Since last evaluation there is no significant change in the general status HEENT: No evidence of stomatitis. Sclera and conjunctivae :: No jaundice.   pale looking. Lungs: Air  entry equal on both sides.  No rhonchi.  No rales.  Cardiac: Heart sounds are normal.  No pericardial rub.  No murmur. Lymphatic system: Cervical, axillary, inguinal, lymph nodes not palpable GI: Abdomen is soft.liver and spleen not palpable.  No ascites.  Bowel sounds are normal.  No other palpable masses.  No tenderness . Lower extremity: No edema Neurological system: Higher functions, cranial nerves intact no evidence of peripheral neuropathy. Skin: No rash.  No ecchymosis.. No petechial hemorrhages Examination of both breasts is within normal limits.  LAB RESULTS:  CBC Latest Ref Rng 12/22/2014 04/19/2014  WBC 3.6 - 11.0 K/uL 6.9 8.0  Hemoglobin 12.0 - 16.0 g/dL 13.3 12.1  Hematocrit 35.0 - 47.0 % 39.1 36.0  Platelets 150 - 440 K/uL 266 273    Appointment on 12/22/2014  Component Date Value Ref Range Status  . WBC 12/22/2014 6.9  3.6 - 11.0 K/uL Final   . RBC 12/22/2014 4.18  3.80 - 5.20 MIL/uL Final  . Hemoglobin 12/22/2014 13.3  12.0 - 16.0 g/dL Final  . HCT 12/22/2014 39.1  35.0 - 47.0 % Final  . MCV 12/22/2014 93.6  80.0 - 100.0 fL Final  . MCH 12/22/2014 31.7  26.0 - 34.0 pg Final  . MCHC 12/22/2014 33.9  32.0 - 36.0 g/dL Final  . RDW 12/22/2014 12.4  11.5 - 14.5 % Final  . Platelets 12/22/2014 266  150 - 440 K/uL Final  . Neutrophils Relative % 12/22/2014 79   Final  . Neutro Abs 12/22/2014 5.5  1.4 - 6.5 K/uL Final  . Lymphocytes Relative 12/22/2014 11   Final  .  Lymphs Abs 12/22/2014 0.7* 1.0 - 3.6 K/uL Final  . Monocytes Relative 12/22/2014 7   Final  . Monocytes Absolute 12/22/2014 0.5  0.2 - 0.9 K/uL Final  . Eosinophils Relative 12/22/2014 2   Final  . Eosinophils Absolute 12/22/2014 0.1  0 - 0.7 K/uL Final  . Basophils Relative 12/22/2014 1   Final  . Basophils Absolute 12/22/2014 0.0  0 - 0.1 K/uL Final  . Sodium 12/22/2014 132* 135 - 145 mmol/L Final  . Potassium 12/22/2014 4.3  3.5 - 5.1 mmol/L Final  . Chloride 12/22/2014 97* 101 - 111 mmol/L Final  . CO2 12/22/2014 29  22 - 32 mmol/L Final  . Glucose, Bld 12/22/2014 143* 65 - 99 mg/dL Final  . BUN 12/22/2014 12  6 - 20 mg/dL Final  . Creatinine, Ser 12/22/2014 0.63  0.44 - 1.00 mg/dL Final  . Calcium 12/22/2014 8.7* 8.9 - 10.3 mg/dL Final  . Total Protein 12/22/2014 7.1  6.5 - 8.1 g/dL Final  . Albumin 12/22/2014 4.2  3.5 - 5.0 g/dL Final  . AST 12/22/2014 31  15 - 41 U/L Final  . ALT 12/22/2014 18  14 - 54 U/L Final  . Alkaline Phosphatase 12/22/2014 49  38 - 126 U/L Final  . Total Bilirubin 12/22/2014 1.0  0.3 - 1.2 mg/dL Final  . GFR calc non Af Amer 12/22/2014 >60  >60 mL/min Final  . GFR calc Af Amer 12/22/2014 >60  >60 mL/min Final   Comment: (NOTE) The eGFR has been calculated using the CKD EPI equation. This calculation has not been validated in all clinical situations. eGFR's persistently <60 mL/min signify possible Chronic Kidney Disease.   .  Anion gap 12/22/2014 6  5 - 15 Final      ASSESSMENT: Carcinoma of left breast.  Status is 0.  Patient was off letrozole but now will resume letrozole with calcium and vitamin D. Radiation pneumonitis has completely resolved patient is off steroids all the CT scan done by pulmonologist has been reviewed independently Rheumatoid arthritis as per Dr. Lowella Petties and nodal patient has been taken off methotrexate and started on meloxicam  MEDICAL DECISION MAKING:  Mammogram is being arranged. Resume letrozole Reevaluation in 6 months  Patient expressed understanding and was in agreement with this plan. She also understands that She can call clinic at any time with any questions, concerns, or complaints.    No matching staging information was found for the patient.  Forest Gleason, MD   12/25/2014 8:41 AM

## 2014-12-28 ENCOUNTER — Other Ambulatory Visit: Payer: Self-pay | Admitting: Neurosurgery

## 2014-12-28 DIAGNOSIS — M47812 Spondylosis without myelopathy or radiculopathy, cervical region: Secondary | ICD-10-CM

## 2015-01-04 ENCOUNTER — Ambulatory Visit
Admission: RE | Admit: 2015-01-04 | Discharge: 2015-01-04 | Disposition: A | Discharge status: Home / Self Care | Payer: Medicare Other | Source: Ambulatory Visit | Attending: Neurosurgery | Admitting: Neurosurgery

## 2015-01-04 DIAGNOSIS — M4802 Spinal stenosis, cervical region: Secondary | ICD-10-CM | POA: Insufficient documentation

## 2015-01-04 DIAGNOSIS — M47812 Spondylosis without myelopathy or radiculopathy, cervical region: Secondary | ICD-10-CM | POA: Diagnosis present

## 2015-01-04 DIAGNOSIS — M2578 Osteophyte, vertebrae: Secondary | ICD-10-CM | POA: Diagnosis not present

## 2015-01-04 DIAGNOSIS — Z981 Arthrodesis status: Secondary | ICD-10-CM | POA: Diagnosis not present

## 2015-01-06 ENCOUNTER — Other Ambulatory Visit: Payer: Self-pay | Admitting: Neurosurgery

## 2015-01-06 DIAGNOSIS — M47812 Spondylosis without myelopathy or radiculopathy, cervical region: Secondary | ICD-10-CM

## 2015-01-07 ENCOUNTER — Ambulatory Visit
Admission: RE | Admit: 2015-01-07 | Discharge: 2015-01-07 | Disposition: A | Discharge status: Home / Self Care | Payer: Medicare Other | Source: Ambulatory Visit | Attending: Neurosurgery | Admitting: Neurosurgery

## 2015-01-07 DIAGNOSIS — Z981 Arthrodesis status: Secondary | ICD-10-CM | POA: Insufficient documentation

## 2015-01-07 DIAGNOSIS — M5030 Other cervical disc degeneration, unspecified cervical region: Secondary | ICD-10-CM | POA: Insufficient documentation

## 2015-01-07 DIAGNOSIS — M47812 Spondylosis without myelopathy or radiculopathy, cervical region: Secondary | ICD-10-CM | POA: Diagnosis present

## 2015-01-11 ENCOUNTER — Encounter: Payer: Self-pay | Admitting: *Deleted

## 2015-01-11 NOTE — Progress Notes (Signed)
Oncology Nurse Navigator Documentation  Oncology Nurse Navigator Flowsheets 01/11/2015  Navigator Encounter Type Telephone  Patient Visit Type Follow-up  Treatment Phase Other  Barriers/Navigation Needs Education  Time Spent with Patient 30   Patient called to discuss her plan for neck surgery in January.  She does not want to take her Tamoxifen until after surgery.  She wants Dr. Metro Kung approval.  Eileen Stanford, RN discussed with Dr. Oliva Bustard.  He is ok waiting to take the Tamoxifen after surgery, but he would like to see her 2 weeks after that.  Informed patient to call me with her surgery date and we will schedule her to follow-up with Dr. Oliva Bustard.  She is agreeable.

## 2015-02-01 ENCOUNTER — Other Ambulatory Visit (HOSPITAL_COMMUNITY): Payer: Self-pay | Admitting: Neurosurgery

## 2015-02-15 ENCOUNTER — Encounter: Payer: Self-pay | Admitting: Internal Medicine

## 2015-02-15 ENCOUNTER — Ambulatory Visit
Admission: RE | Admit: 2015-02-15 | Discharge: 2015-02-15 | Disposition: A | Discharge status: Home / Self Care | Payer: Medicare Other | Source: Ambulatory Visit | Attending: Internal Medicine | Admitting: Internal Medicine

## 2015-02-15 ENCOUNTER — Ambulatory Visit (INDEPENDENT_AMBULATORY_CARE_PROVIDER_SITE_OTHER): Payer: Medicare Other | Admitting: Internal Medicine

## 2015-02-15 VITALS — BP 118/64 | HR 91 | Temp 98.4°F | Ht 62.0 in | Wt 123.0 lb

## 2015-02-15 DIAGNOSIS — R05 Cough: Secondary | ICD-10-CM | POA: Diagnosis not present

## 2015-02-15 DIAGNOSIS — J700 Acute pulmonary manifestations due to radiation: Secondary | ICD-10-CM

## 2015-02-15 DIAGNOSIS — J3489 Other specified disorders of nose and nasal sinuses: Secondary | ICD-10-CM | POA: Diagnosis not present

## 2015-02-15 DIAGNOSIS — R059 Cough, unspecified: Secondary | ICD-10-CM

## 2015-02-15 DIAGNOSIS — J189 Pneumonia, unspecified organism: Secondary | ICD-10-CM | POA: Insufficient documentation

## 2015-02-15 DIAGNOSIS — J069 Acute upper respiratory infection, unspecified: Secondary | ICD-10-CM

## 2015-02-15 NOTE — Progress Notes (Signed)
[facility] Consultation      MRN# [id number] ILAMAE GENG May 13, 1940   CC: Chief Complaint  Patient presents with  . Follow-up    dry cough, low grade temp; drainage   August 2015, found to have consolidation groundglass opacities repeat CT March 2016. Bronchoscopy with BAL showed no atypical organisms and all cultures. 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   06/10/14 ROV: Patient presents today for a followup visit. She was last seen for suspected left upper lobe consolidation and groundglass opacities thought to be possible pneumonia, atypical infection, radiation pneumonitis. She had bronchoscopy performed on 05/26/2014 which he tolerated well. All cultures from that current bronchoscopy currently negative. Patient followup with her oncologist on 06/08/2014 and was told that she restarted on steroids for suspected radiation pneumonitis. At that time she was started on 50 mg of prednisone and advised to followup with pulmonary for further recommendations to treat her left upper lobe abnormality.  Today patient states that she still has low energy, but she is managing okay, but she still endorses a dry cough, it is intermittent at times. 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 States that her breathing is doing well, no further cough or sob. 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   Had repeat CXR done. Plan: Prednisone 78m for 3 weeks, then 366muntil follow up   ROV 08/24/14  She presents today for follow-up visit of her radiation pneumonitis. Since her last visit she had one acute care visit I'll about pulmonary [place], she was seen and evaluated for cough and nasal congestion, diagnosed with upper airway cough syndrome and advised to  continue with supportive care. She states that today she is doing overall well, she does not endorse any nasal congestion, or cough. States that she is very to wean down prednisone more if possible today. States that she saw her primary care physician, has a history of left foot drop and will be following with neurology. Overall states that her breathing is improving, and she feels she is back to her baseline.   ROV 09/16/14:    She presents today for an acute visit, she is accompanied by her husband. Patient states that she's been having sinus drainage, which is clear, and nasal congestion for the past couple of days. She has been using nasal steroids intermittently and netti pot with good relief. A few weeks ago she had similar symptoms and was diagnosed with postnasal drip and upper airway cough syndrome. Today she denies any worsening shortness of breath, any worsening cough, no fever, no productive sputum. Overall patient states that she is doing well, however she has noticed that she's had some more hair loss since being on steroids. Plan: - doing well today, almost complete resolution of LLL consolidation based on last study - Prednisone 1538mntil follow up appointment, if symptoms return, then go back to 22m9mplan for repeat cxr, 2 view, prior to follow up visit.  ROV 10/13/14 Patient presents today for a follow-up of her previous treatment for radiation pneumonitis. She states since her last visit she is doing well, she had a repeat chest x-ray done recently that showed complete resolution of the left lower lobe consolidation. Patient recently had a lost and her family, her brother just passed away. Patient states she has  a mild non-productive intermittent cough maybe once or twice a week. Overall patient states she has great critical improvement and is doing well.   Patient stated today that she is not noticing a large amount of hair loss since reduction of prednisone at last  visit. Plan: - doing well today, now with complete resolution of LLL consolidation based on last study - Prednisone 68m until follow up appointment, if symptoms return, then go back to 225m   ROV 10/2014: Presents for follow up of radiation pneumonitis, currently on 1014mrednisone daily. Still with mild cough in the morning, more of a clearing of throat. Overall with good clinical improvement from a pulmonary standpoint. Off, note following with PMD for foot drop (right), getting nerve conduction studies  Repeat CT performed on 11/10/14  Events since last clinic visit: Presents for follow up of radiation pneumonitis, weaned off prednisone since last visit. Dry cough today  Recently went to urgent care for cough and sinus drainage, dx with bronchitis, given Amoxicillin for 10 days. Schedule for Cspine surgery in Dec (Cspine fusion).  Overall with good improvement. Has not seen in ENT in the past. Has used nasal steroid in the past, but stopped due to BP issues.   Medication:   Current Outpatient Rx  Name  Route  Sig  Dispense  Refill  . benzonatate (TESSALON) 100 MG capsule   Oral   Take 1 capsule by mouth 3 (three) times daily as needed.      0   . Blood Glucose Monitoring Suppl (ONE TOUCH ULTRA 2) W/DEVICE KIT            11     Dispense as written.   . cyanocobalamin 2000 MCG tablet   Oral   Take 2,000 mcg by mouth daily.         . folic acid (FOLVITE) 1 MG tablet   Oral   Take 1 mg by mouth daily.      11   . gabapentin (NEURONTIN) 100 MG capsule   Oral   Take 100 mg by mouth 3 (three) times daily.         . GMarland KitchenUCAGON EMERGENCY 1 MG injection            0     Dispense as written.   . hydroxychloroquine (PLAQUENIL) 200 MG tablet   Oral   Take 200 mg by mouth daily.         . insulin aspart (NOVOLOG) 100 UNIT/ML FlexPen      Per Sliding scale         . metFORMIN (GLUCOPHAGE) 1000 MG tablet   Oral   Take 1,000 mg by mouth daily.       1    . montelukast (SINGULAIR) 10 MG tablet   Oral   Take 1 tablet (10 mg total) by mouth at bedtime.   30 tablet   1   . pravastatin (PRAVACHOL) 40 MG tablet   Oral   Take 40 mg by mouth daily.         . tamoxifen (NOLVADEX) 20 MG tablet   Oral   Take 1 tablet (20 mg total) by mouth daily. Patient not taking: Reported on 02/15/2015   30 tablet   6      Review of Systems  Constitutional: Negative for fever, chills and weight loss.  HENT: Negative for congestion and ear discharge.        Mild nasal drainage.   Eyes: Negative.  Negative for blurred vision.  Respiratory: Positive for cough. Negative for sputum production and shortness of breath.        Mild cough - more clearing of the throat  Cardiovascular: Negative for chest pain and palpitations.  Gastrointestinal: Negative for heartburn, nausea and vomiting.  Genitourinary: Negative.   Skin: Negative for itching and rash.  Neurological: Positive for tingling. Negative for headaches.  Endo/Heme/Allergies: Negative.   Psychiatric/Behavioral: Negative.       Allergies:  Codeine  Physical Examination:  VS: BP 118/64 mmHg  Pulse 91  Temp(Src) 98.4 F (36.9 C) (Oral)  Ht '5\' 2"'  (1.575 m)  Wt 123 lb (55.792 kg)  BMI 22.49 kg/m2  SpO2 96%  General Appearance: No distress  HEENT: PERRLA, no ptosis, no other lesions noticed Pulmonary:normal breath sounds., diaphragmatic excursion normal.No wheezing, No rales   Cardiovascular:  Normal S1,S2.  No m/r/g.     Abdomen:Exam: Benign, Soft, non-tender, No masses  Skin:   warm, no rashes, no ecchymosis  Extremities: normal, no cyanosis, clubbing, warm with normal capillary refill.      Rad results: (The following images and results were reviewed by Dr. Stevenson Clinch). CT Chest 11/10/14 Dense consolidation with air bronchograms in the lingula has nearly resolved. There remains some mild linear scarring in the lingula. Air bronchograms have resolved. Patchy infiltrate in the left  lower lobe has nearly resolved with a small amount of residual scarring in this area as well. Mild patchy infiltrates in the upper lobes bilaterally in the apices have resolved.  New area of infiltrate in the superior segment right lower lobe is mild but not present previously.  Negative for pleural effusion. Negative for adenopathy.  2 cm seroma left breast has improved considerably since the prior study.  Negative for thoracic fracture or mass lesion.  IMPRESSION: Considerable improvement in dense lingular consolidation with air bronchograms. Mild linear scarring remains in this area. Similar improvement left lower lobe infiltrate and biapical infiltrates.  New area of infiltrate in the superior segment right lower lobe which is mild.  The above areas are most consistent with infectious pneumonia given their multifocal pattern and location outside of the radiation field.    Assessment and Plan: URI, acute Her symptoms of cough, sputum production and mild fever, and inconjuction with a URI. 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 Patient is current off steroids. Today with symptoms of URI, recently treated for bronchitis.  Plan: - repeat 2 view CXR - cont with current antibiotics - no need for steroids at this time - see plan for URI.               Cough Initially thought to be due to radiation pneumonitis, but from further history she is having sinus drainage at times. She was advised to follow up with ENT at last visit,  but did not.  Plan - cont with current allergy regiment - ENT follow up     Updated Medication List Outpatient Encounter Prescriptions as of 02/15/2015  Medication Sig  . benzonatate (TESSALON) 100 MG capsule Take 1 capsule by  mouth 3 (three) times daily as needed.  . Blood Glucose Monitoring Suppl (ONE TOUCH ULTRA 2) W/DEVICE KIT   . cyanocobalamin 2000 MCG tablet Take 2,000 mcg by mouth daily.  . folic acid (FOLVITE) 1 MG tablet Take 1 mg by mouth daily.  Marland Kitchen gabapentin (NEURONTIN) 100 MG capsule Take 100 mg by mouth 3 (three) times daily.  Marland Kitchen GLUCAGON EMERGENCY 1 MG injection   . hydroxychloroquine (PLAQUENIL) 200 MG tablet Take 200 mg by mouth daily.  . insulin aspart (NOVOLOG) 100 UNIT/ML FlexPen Per Sliding scale  . metFORMIN (GLUCOPHAGE) 1000 MG tablet Take 1,000 mg by mouth daily.   . montelukast (SINGULAIR) 10 MG tablet Take 1 tablet (10 mg total) by mouth at bedtime.  . pravastatin (PRAVACHOL) 40 MG tablet Take 40 mg by mouth daily.  . tamoxifen (NOLVADEX) 20 MG tablet Take 1 tablet (20 mg total) by mouth daily. (Patient not taking: Reported on 02/15/2015)   No facility-administered encounter medications on file as of 02/15/2015.    Orders for this visit: Orders Placed This Encounter  Procedures  . DG Chest 2 View    Order Specific Question:  Reason for Exam (SYMPTOM  OR DIAGNOSIS REQUIRED)    Answer:  dyspnea    Order Specific Question:  Preferred imaging location?    Answer:  [facility]  . Ambulatory referral to ENT    Referral Priority:  Routine    Referral Type:  Consultation    Referral Reason:  Specialty Services Required    Requested Specialty:  Otolaryngology    Number of Visits Requested:  1    Thank  you for the visitation and for allowing  [facility] to assist in the care of your patient. Our recommendations are noted above.  Please contact us if we can be of further service.  Vilinda Boehringer, MD [facility] Office Number: [phone]

## 2015-02-15 NOTE — Patient Instructions (Addendum)
Follow up with Dr. Stevenson Clinch in: 3 months - complete out your current dose of Antibiotics - obtain 2 view CXR today if possible - we will schedule an ENT appt for you due to chronic sinus drainage.  - cont with current allergy regiment.

## 2015-02-16 ENCOUNTER — Telehealth: Payer: Self-pay | Admitting: *Deleted

## 2015-02-16 DIAGNOSIS — J069 Acute upper respiratory infection, unspecified: Secondary | ICD-10-CM | POA: Insufficient documentation

## 2015-02-16 MED ORDER — LEVOFLOXACIN 500 MG PO TABS: 500.0000 mg | ORAL_TABLET | Freq: Every day | ORAL | Status: DC

## 2015-02-16 NOTE — Assessment & Plan Note (Signed)
Initially thought to be due to radiation pneumonitis, but from further history she is having sinus drainage at times. She was advised to follow up with ENT at last visit, but did not.  Plan - cont with current allergy regiment - ENT follow up

## 2015-02-16 NOTE — Assessment & Plan Note (Signed)
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 Patient is current off steroids. Today with symptoms of URI, recently treated for bronchitis.  Plan: - repeat 2 view CXR - cont with current antibiotics - no need for steroids at this time - see plan for URI.

## 2015-02-16 NOTE — Telephone Encounter (Signed)
-----   Message from Vilinda Boehringer, MD sent at 02/16/2015 10:59 AM EST ----- Regarding: cxr results Please inform patient of her chest xray results 1. There is mild left upper lobe pneumonia - it is very, very small 2. No more findings of radiation pneumonitis in the left lung  Plan - since she is having surgery soon, will complete treat this pneumonia with a different antibiotic - levaquin 500mg , 1 tab PO x 7 days.   Thanks VM

## 2015-02-16 NOTE — Assessment & Plan Note (Signed)
Her symptoms of cough, sputum production and mild fever, and inconjuction with a URI. She is at risk for CAP, given recent LUL radiation pneumonitis  Plan - CXR - if inflitrate noted, will add levaquin 500mg , 1 tab PO x 7 days - cont with current dose of amoxicillin - incentive spirometry at home

## 2015-02-16 NOTE — Telephone Encounter (Signed)
Pt informed of results and rx sent to pharmacy. Nothing further needed.

## 2015-02-17 ENCOUNTER — Telehealth: Payer: Self-pay | Admitting: Internal Medicine

## 2015-02-17 NOTE — Telephone Encounter (Signed)
New message     Pt was prescribed levofloxacin 500mg  yesterday. She has a pneumonia spot on her lungs.  Should pt call Dr Annette Stable and resc her 12-27 back surgery because of the possible side effects from levofloxacin.  Pt states she does not have good joints and one of the side effects is major joint pain. Pt has had 2 pills----so far no side effects.  Please advise

## 2015-02-18 NOTE — Telephone Encounter (Signed)
Certainly. See Below.

## 2015-02-18 NOTE — Telephone Encounter (Signed)
Can you please send this to pulmonary?  Thank you!

## 2015-02-18 NOTE — Telephone Encounter (Signed)
Pt is calling stating she is concerned about the side effects of the Levaquin we sent in for her PNA. States her upper arms are a little sore but she is handling that. Has 4 more tabs to take. Is asking if you want to call her in something else or do you want her to go ahead and finish it. Please advise.

## 2015-02-18 NOTE — Telephone Encounter (Signed)
We can do just 7 days of levaquin and then stop, if she can tolerate. If she cannot tolerate it, then we can stop levaquin completely and try a zpack.   -VM

## 2015-02-18 NOTE — Telephone Encounter (Signed)
Pt states she is in [place] and will try to tolerate the Levaquin to finish it. Says she will take Tylenol for the soreness if needed. Nothing further.

## 2015-03-07 ENCOUNTER — Encounter (HOSPITAL_COMMUNITY): Payer: Self-pay

## 2015-03-07 ENCOUNTER — Encounter (HOSPITAL_COMMUNITY)
Admission: RE | Admit: 2015-03-07 | Discharge: 2015-03-07 | Disposition: A | Discharge status: Home / Self Care | Payer: Medicare Other | Source: Ambulatory Visit | Attending: Neurosurgery | Admitting: Neurosurgery

## 2015-03-07 DIAGNOSIS — M069 Rheumatoid arthritis, unspecified: Secondary | ICD-10-CM | POA: Insufficient documentation

## 2015-03-07 DIAGNOSIS — R9431 Abnormal electrocardiogram [ECG] [EKG]: Secondary | ICD-10-CM | POA: Insufficient documentation

## 2015-03-07 DIAGNOSIS — Z01812 Encounter for preprocedural laboratory examination: Secondary | ICD-10-CM | POA: Insufficient documentation

## 2015-03-07 DIAGNOSIS — J310 Chronic rhinitis: Secondary | ICD-10-CM | POA: Insufficient documentation

## 2015-03-07 DIAGNOSIS — E785 Hyperlipidemia, unspecified: Secondary | ICD-10-CM | POA: Insufficient documentation

## 2015-03-07 DIAGNOSIS — Z853 Personal history of malignant neoplasm of breast: Secondary | ICD-10-CM | POA: Insufficient documentation

## 2015-03-07 DIAGNOSIS — Z79899 Other long term (current) drug therapy: Secondary | ICD-10-CM | POA: Insufficient documentation

## 2015-03-07 DIAGNOSIS — E119 Type 2 diabetes mellitus without complications: Secondary | ICD-10-CM | POA: Insufficient documentation

## 2015-03-07 DIAGNOSIS — Z794 Long term (current) use of insulin: Secondary | ICD-10-CM | POA: Diagnosis not present

## 2015-03-07 DIAGNOSIS — Z01818 Encounter for other preprocedural examination: Secondary | ICD-10-CM | POA: Insufficient documentation

## 2015-03-07 DIAGNOSIS — J42 Unspecified chronic bronchitis: Secondary | ICD-10-CM | POA: Insufficient documentation

## 2015-03-07 DIAGNOSIS — I10 Essential (primary) hypertension: Secondary | ICD-10-CM | POA: Diagnosis not present

## 2015-03-07 HISTORY — DX: Other specified postprocedural states: R11.2

## 2015-03-07 HISTORY — DX: Nausea with vomiting, unspecified: Z98.890

## 2015-03-07 HISTORY — DX: Pneumonia, unspecified organism: J18.9

## 2015-03-07 LAB — CBC WITH DIFFERENTIAL/PLATELET
Basophils Absolute: 0 10*3/uL (ref 0.0–0.1)
Basophils Relative: 0 %
EOS PCT: 2 %
Eosinophils Absolute: 0.2 10*3/uL (ref 0.0–0.7)
HCT: 32.3 % — ABNORMAL LOW (ref 36.0–46.0)
Hemoglobin: 10.6 g/dL — ABNORMAL LOW (ref 12.0–15.0)
LYMPHS ABS: 0.7 10*3/uL (ref 0.7–4.0)
LYMPHS PCT: 8 %
MCH: 30 pg (ref 26.0–34.0)
MCHC: 32.8 g/dL (ref 30.0–36.0)
MCV: 91.5 fL (ref 78.0–100.0)
MONO ABS: 0.8 10*3/uL (ref 0.1–1.0)
Monocytes Relative: 9 %
Neutro Abs: 6.9 10*3/uL (ref 1.7–7.7)
Neutrophils Relative %: 81 %
PLATELETS: 375 10*3/uL (ref 150–400)
RBC: 3.53 MIL/uL — ABNORMAL LOW (ref 3.87–5.11)
RDW: 12 % (ref 11.5–15.5)
WBC: 8.6 10*3/uL (ref 4.0–10.5)

## 2015-03-07 LAB — BASIC METABOLIC PANEL
Anion gap: 11 (ref 5–15)
BUN: 5 mg/dL — AB (ref 6–20)
CHLORIDE: 97 mmol/L — AB (ref 101–111)
CO2: 25 mmol/L (ref 22–32)
Calcium: 9.2 mg/dL (ref 8.9–10.3)
Creatinine, Ser: 0.46 mg/dL (ref 0.44–1.00)
GFR calc Af Amer: >60 mL/min (ref >60)
GFR calc non Af Amer: >60 mL/min (ref >60)
GLUCOSE: 105 mg/dL — AB (ref 65–99)
POTASSIUM: 4.1 mmol/L (ref 3.5–5.1)
Sodium: 133 mmol/L — ABNORMAL LOW (ref 135–145)

## 2015-03-07 LAB — GLUCOSE, CAPILLARY: Glucose-Capillary: 107 mg/dL — ABNORMAL HIGH (ref 65–99)

## 2015-03-07 LAB — SURGICAL PCR SCREEN
MRSA, PCR: NEGATIVE
Staphylococcus aureus: NEGATIVE

## 2015-03-07 NOTE — Pre-Procedure Instructions (Signed)
[address]ley [address]ray  03/07/2015      [facility] [address], [place] AT [address] [place] [address] Phone: [phone] Fax: [phone]    Your procedure is scheduled on   Wednesday  03/16/15  Report to [facility] Admitting at 800 A.M.  Call this number if you have problems the morning of surgery:  [phone]   Remember:  Do not eat food or drink liquids after midnight.  Take these medicines the morning of surgery with A SIP OF WATER   GABAPENTIN How to Manage Your Diabetes Before Surgery   Why is it important to control my blood sugar before and after surgery?   Improving blood sugar levels before and after surgery helps healing and can limit problems.  A way of improving blood sugar control is eating a healthy diet by:  - Eating less sugar and carbohydrates  - Increasing activity/exercise  - Talk with your doctor about reaching your blood sugar goals  High blood sugars (greater than 180 mg/dL) can raise your risk of infections and slow down your recovery so you will need to focus on controlling your diabetes during the weeks before surgery.  Make sure that the doctor who takes care of your diabetes knows about your planned surgery including the date and location.  How do I manage my blood sugars before surgery?   Check your blood sugar at least 4 times a day, 2 days before surgery to make sure that they are not too high or low.   Check your blood sugar the morning of your surgery when you wake up and every 2               hours until you get to the Short-Stay unit.  If your blood sugar is less than 70 mg/dL, you will need to treat for low blood sugar by:  Treat a low blood sugar (less than 70 mg/dL) with 1/2 cup of clear juice (cranberry or apple), 4 glucose tablets, OR glucose gel.  Recheck blood sugar in 15 minutes after treatment (to make sure it is greater than 70 mg/dL).   If blood sugar is not greater than 70 mg/dL on re-check, call [phone] for further instructions.   Report your blood sugar to the Short-Stay nurse when you get to Short-Stay.  References:  [facility], 2007 "How to Manage your Diabetes Before and After Surgery".  What do I do about my diabetes medications?   Do not take oral diabetes medicines (pills) the morning of surgery.  THE NIGHT BEFORE SURGERY, take  IF NEEDED    THE MORNING OF SURGERY, take  I/2 DOSE IF BLOOD SUGAR GREATER THAN 220 .    Do not take other diabetes injectables the day of surgery including Byetta, Victoza, Bydureon, and Trulicity.    If your CBG is greater than 220 mg/dL, you may take 1/2 of your sliding scale (correction) dose of insulin.   For patients with "Insulin Pumps":  Contact your diabetes doctor for specific instructions before surgery.   Decrease basal insulin rates by 20% at midnight the night before surgery.  Note that if your surgery is planned to be longer than 2 hours, your insulin pump will be removed and intravenous (IV) insulin will be started and managed by the nurses and anesthesiologist.  You will be able to restart your insulin pump once you  are awake and able to manage it.  Make sure to bring insulin pump supplies to the hospital with you in case your site needs to be changed.        Do not wear jewelry, make-up or nail polish.  Do not wear lotions, powders, or perfumes.  You may wear deodorant.  Do not shave 48 hours prior to surgery.  Men may shave face and neck.  Do not bring valuables to the hospital.  [facility] is not responsible for any belongings or valuables.  Contacts, dentures or bridgework may not be worn into surgery.  Leave your suitcase in the car.  After surgery it may be brought to your room.  For patients admitted to the hospital, discharge time will be determined by your treatment team.  Patients discharged the day of  surgery will not be allowed to drive home.   Name and phone number of your driver:    Special instructions:  SEE PREPARING FOR SURGERY  Please read over the following fact sheets that you were given. Pain Booklet, Coughing and Deep Breathing, Blood Transfusion Information, MRSA Information and Surgical Site Infection Prevention

## 2015-03-08 LAB — HEMOGLOBIN A1C
Hgb A1c MFr Bld: 6.7 % — ABNORMAL HIGH (ref 4.8–5.6)
Mean Plasma Glucose: 146 mg/dL

## 2015-03-08 NOTE — Progress Notes (Addendum)
Anesthesia Chart Review:  Pt is a 74 year old female scheduled for C2-3, C3-4 ACDF on 03/16/2015 with Dr. Annette Stable.   PCP is Dr. Lisette Grinder (care everywhere).   PMH includes:  HTN (no meds; BP 138/56 at PAT), DM, hyperlipidemia, RA, breast cancer, post-op N/V. Never smoker. BMI 22.5  Medications include: hydroxychloroquine, novolog, metformin, pravastatin.   Preoperative labs reviewed.  Glucose 105, hgbA1c 6.7.  Chest x-ray 02/15/15 reviewed.  1. Pneumonia involving the left lower lobe and minimally the left upper lobe. 2. No evidence of residual or recurrent disease in the lingula at the site of prior radiation pneumonitis. -- pt was treated with levaquin  EKG 03/06/14: NSR. Possible Anterior infarct, age undetermined   Willeen Cass, FNP-BC [facility] Short Stay Surgical Center/Anesthesiology Phone: [phone] 03/08/2015 4:55 PM  Addendum:  Hulen Skains and spoke with pt to ensure she has recovered from pneumonia dx 02/15/15. Pt reports she still has cough, fever. Instructed pt to f/u ASAP with either pulmonologist Dr. Stevenson Clinch who dx pneumonia or PCP, Dr. Gilford Rile, whoever can see pt sooner. Notified Manuela Schwartz in Dr. Marchelle Folks office pt will need clearance from either Dr. Stevenson Clinch or Dr. Gilford Rile prior to surgery to ensure she is well enough for surgery.   Willeen Cass, FNP-BC [facility] Short Stay Surgical Center/Anesthesiology Phone: [phone] 03/09/2015 4:01 PM  Addendum: Patient was seen by pulmonologist Dr. Flora Lipps on 03/10/15 for PNA follow-up with ongoing cough, low grade fever of 100.  She notified Dr. Mortimer Fries of surgery plans who felt current symptoms likely acute URI from rhinovirus with underlying allergic rhinitis. Zyrtec, Atrovent, and nasal saline washes prescribed. Notes indicate: After further assessment, patient is OK to proceed with spinal surgery unless her respiratory symptoms worsen. Patient is LOW risk for POST op Pulm complications.  George Hugh [facility] Short Stay  Center/Anesthesiology Phone [phone] 03/11/2015 12:00 PM  Addendum: Seen again by pulmonology today by Dr. Ashby Dawes. He writes: Pre-op Pulmonary clearance.  --Given that she shows no signs of respiratory decompensation, I think she would be at moderate risk of pulmonary complications with surgery and could proceed with this from respiratory standpoint.   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 Metastatic disease.  --Will need a CT chest and possible bronchoscopy. This could be performed after her surgery.   Chronic bronchitis.  --Will start Anoro inhaler, given sample today.   Chronic rhinitis.  --Continue sinus rinse, zyrtec, and nasal spray per ENT recs.   George Hugh [facility] Short Stay Center/Anesthesiology Phone [phone] 03/15/2015 12:40 PM

## 2015-03-10 ENCOUNTER — Ambulatory Visit (INDEPENDENT_AMBULATORY_CARE_PROVIDER_SITE_OTHER): Payer: Medicare Other | Admitting: Internal Medicine

## 2015-03-10 ENCOUNTER — Encounter: Payer: Self-pay | Admitting: Internal Medicine

## 2015-03-10 VITALS — BP 132/70 | HR 107 | Ht 62.0 in | Wt 123.0 lb

## 2015-03-10 DIAGNOSIS — J069 Acute upper respiratory infection, unspecified: Secondary | ICD-10-CM | POA: Diagnosis not present

## 2015-03-10 DIAGNOSIS — J309 Allergic rhinitis, unspecified: Secondary | ICD-10-CM

## 2015-03-10 MED ORDER — CETIRIZINE HCL 10 MG PO TABS: 10.0000 mg | ORAL_TABLET | Freq: Every day | ORAL | Status: AC

## 2015-03-10 MED ORDER — IPRATROPIUM BROMIDE 0.03 % NA SOLN: 2.0000 | Freq: Four times a day (QID) | NASAL | Status: DC

## 2015-03-10 NOTE — Progress Notes (Signed)
[facility] Consultation      MRN# [id number] RIONNA FELTES 1941-03-06   CC: Chief Complaint  Patient presents with  . Acute Visit    pt. states she prod. cough yellowish in color. low grade fever of 100 and drainage.    HPI: Patient has signs and symptoms of sinus drainage with minimal productive cough Patient states that patient has low grade temps Patient has been scheduled for c -spine fusion next week Patient sitting up comfortably, NAD  Patient had recent ABX with Levaquin with abnormal CXR several weeks ago-she feels much better since then  Her resp symptoms of URI started 3 days ago, her husband has been having URI symptoms as well   Medication:   Current Outpatient Rx  Name  Route  Sig  Dispense  Refill  . acetaminophen (TYLENOL) 500 MG tablet   Oral   Take 500 mg by mouth every 8 (eight) hours as needed for mild pain or moderate pain.         . benzonatate (TESSALON) 100 MG capsule   Oral   Take 1 capsule by mouth 3 (three) times daily as needed for cough.       0   . Blood Glucose Monitoring Suppl (ONE TOUCH ULTRA 2) W/DEVICE KIT            11     Dispense as written.   . folic acid (FOLVITE) 1 MG tablet   Oral   Take 1 mg by mouth daily.      11   . gabapentin (NEURONTIN) 100 MG capsule   Oral   Take 100 mg by mouth 3 (three) times daily.         Marland Kitchen GLUCAGON EMERGENCY 1 MG injection   Intramuscular   Inject 1 mg into the muscle once as needed.       0     Dispense as written.   . hydroxychloroquine (PLAQUENIL) 200 MG tablet   Oral   Take 200 mg by mouth daily.         . insulin aspart (NOVOLOG) 100 UNIT/ML FlexPen   Subcutaneous   Inject 2 Units into the skin as needed for high blood sugar (If sugar get 182 and above. Starts at 2 units. Pt knows how to calculate dosage). Per Sliding scale         . meloxicam (MOBIC) 7.5 MG tablet   Oral   Take 7.5 mg by mouth daily as needed for pain.         .  metFORMIN (GLUCOPHAGE) 500 MG tablet   Oral   Take 1,000 mg by mouth 2 (two) times daily with a meal.         . montelukast (SINGULAIR) 10 MG tablet   Oral   Take 1 tablet (10 mg total) by mouth at bedtime. Patient taking differently: Take 10 mg by mouth at bedtime as needed.    30 tablet   1   . pravastatin (PRAVACHOL) 40 MG tablet   Oral   Take 40 mg by mouth daily.         . vitamin B-12 (CYANOCOBALAMIN) 1000 MCG tablet   Oral   Take 1,000 mcg by mouth 2 (two) times daily.         . cetirizine (ZYRTEC ALLERGY) 10 MG tablet   Oral   Take 1 tablet (10 mg total) by mouth daily.   30 tablet   12   . ipratropium (ATROVENT)  0.03 % nasal spray   Nasal   Place 2 sprays into the nose 4 (four) times daily.   30 mL   1      Review of Systems  Constitutional: Negative for fever, chills and weight loss.  HENT: Positive for congestion. Negative for ear discharge.        Mild nasal drainage.   Eyes: Negative.   Respiratory: Positive for cough. Negative for sputum production, shortness of breath and wheezing.        Mild cough - more clearing of the throat  Cardiovascular: Negative for chest pain and palpitations.  Neurological: Negative for headaches.      Allergies:  Codeine  Physical Examination:  VS: BP 132/70 mmHg  Pulse 107  Ht '5\' 2"'  (1.575 m)  Wt 123 lb (55.792 kg)  BMI 22.49 kg/m2  SpO2 92%  General Appearance: No distress  HEENT: PERRLA, no ptosis, no other lesions noticed No sinus tenderness Pulmonary:normal breath sounds., diaphragmatic excursion normal.No wheezing, No rales   Cardiovascular:  Normal S1,S2.  No m/r/g.        Assessment and Plan:  74 yo white female with h/o BRCA with h /o Radiation pneumonitis with recent dx of pneumonia now with acute URI likely from rhinovirus with underlying allergic rhinitis. Patient to have C-spine fusion next week  1.start zyrtec 2.start atrovent nasal sprays 3.nasal saline washes once daily 4.tylenol  as needed   After further assessment, patient is OK to proceed with spinal  surgery unless her respiratory  symptoms worsen. Patient is LOW risk for POST op Pulm complications.   I have personally obtained a history, examined the patient. The Patient requires high complexity decision making for assessment and support, frequent evaluation and titration of therapies. Patient/Family are satisfied with Plan of action and management. All questions answered  Corrin Parker, M.D.  [facility]  Medical Director [place] Director [facility]io[facility] Department

## 2015-03-10 NOTE — Progress Notes (Signed)
[facility] Consultation      MRN# [id number] RANA HOCHSTEIN [birth year]   CC: Chief Complaint  Patient presents with  . Acute Visit    pt. states she prod. cough yellowish in color. low grade fever of 100 and drainage.    August 2015, found to have consolidation groundglass opacities repeat CT March 2016. Bronchoscopy with BAL showed no atypical organisms and all cultures. 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   06/10/14 ROV: Patient presents today for a followup visit. She was last seen for suspected left upper lobe consolidation and groundglass opacities thought to be possible pneumonia, atypical infection, radiation pneumonitis. She had bronchoscopy performed on 05/26/2014 which he tolerated well. All cultures from that current bronchoscopy currently negative. Patient followup with her oncologist on 06/08/2014 and was told that she restarted on steroids for suspected radiation pneumonitis. At that time she was started on 50 mg of prednisone and advised to followup with pulmonary for further recommendations to treat her left upper lobe abnormality.  Today patient states that she still has low energy, but she is managing okay, but she still endorses a dry cough, it is intermittent at times. 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 States that her breathing is doing well, no further cough or sob. 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   Had repeat CXR done. Plan: Prednisone 62m for 3 weeks, then 341muntil follow up   ROV 08/24/14  She presents today for follow-up visit of her radiation pneumonitis. Since her last visit she had one acute care visit I'll about pulmonary [place], she was seen and evaluated for cough and nasal congestion, diagnosed  with upper airway cough syndrome and advised to continue with supportive care. She states that today she is doing overall well, she does not endorse any nasal congestion, or cough. States that she is very to wean down prednisone more if possible today. States that she saw her primary care physician, has a history of left foot drop and will be following with neurology. Overall states that her breathing is improving, and she feels she is back to her baseline.   ROV 09/16/14:    She presents today for an acute visit, she is accompanied by her husband. Patient states that she's been having sinus drainage, which is clear, and nasal congestion for the past couple of days. She has been using nasal steroids intermittently and netti pot with good relief. A few weeks ago she had similar symptoms and was diagnosed with postnasal drip and upper airway cough syndrome. Today she denies any worsening shortness of breath, any worsening cough, no fever, no productive sputum. Overall patient states that she is doing well, however she has noticed that she's had some more hair loss since being on steroids. Plan: - doing well today, almost complete resolution of LLL consolidation based on last study - Prednisone 1552mntil follow up appointment, if symptoms return, then go back to 33m46mplan for repeat cxr, 2 view, prior to follow up visit.  ROV 10/13/14 Patient presents today for a follow-up of her previous treatment for radiation pneumonitis. She states since her last visit she is doing well, she had a repeat chest x-ray done recently that showed complete resolution of the left lower lobe consolidation. Patient recently had a lost and  her family, her brother just passed away. Patient states she has a mild non-productive intermittent cough maybe once or twice a week. Overall patient states she has great critical improvement and is doing well.   Patient stated today that she is not noticing a large amount of  hair loss since reduction of prednisone at last visit. Plan: - doing well today, now with complete resolution of LLL consolidation based on last study - Prednisone 53m until follow up appointment, if symptoms return, then go back to 217m   ROV 10/2014: Presents for follow up of radiation pneumonitis, currently on 1059mrednisone daily. Still with mild cough in the morning, more of a clearing of throat. Overall with good clinical improvement from a pulmonary standpoint. Off, note following with PMD for foot drop (right), getting nerve conduction studies  Repeat CT performed on 11/10/14  Events since last clinic visit: Presents for follow up of radiation pneumonitis, weaned off prednisone since last visit. Dry cough today  Recently went to urgent care for cough and sinus drainage, dx with bronchitis, given Amoxicillin for 10 days. Schedule for Cspine surgery in Dec (Cspine fusion).  Overall with good improvement. Has not seen in ENT in the past. Has used nasal steroid in the past, but stopped due to BP issues.   Medication:   Current Outpatient Rx  Name  Route  Sig  Dispense  Refill  . acetaminophen (TYLENOL) 500 MG tablet   Oral   Take 500 mg by mouth every 8 (eight) hours as needed for mild pain or moderate pain.         . benzonatate (TESSALON) 100 MG capsule   Oral   Take 1 capsule by mouth 3 (three) times daily as needed for cough.       0   . Blood Glucose Monitoring Suppl (ONE TOUCH ULTRA 2) W/DEVICE KIT            11     Dispense as written.   . folic acid (FOLVITE) 1 MG tablet   Oral   Take 1 mg by mouth daily.      11   . gabapentin (NEURONTIN) 100 MG capsule   Oral   Take 100 mg by mouth 3 (three) times daily.         . GMarland KitchenUCAGON EMERGENCY 1 MG injection   Intramuscular   Inject 1 mg into the muscle once as needed.       0     Dispense as written.   . hydroxychloroquine (PLAQUENIL) 200 MG tablet   Oral   Take 200 mg by mouth daily.           . insulin aspart (NOVOLOG) 100 UNIT/ML FlexPen   Subcutaneous   Inject 2 Units into the skin as needed for high blood sugar (If sugar get 182 and above. Starts at 2 units. Pt knows how to calculate dosage). Per Sliding scale         . meloxicam (MOBIC) 7.5 MG tablet   Oral   Take 7.5 mg by mouth daily as needed for pain.         . metFORMIN (GLUCOPHAGE) 500 MG tablet   Oral   Take 1,000 mg by mouth 2 (two) times daily with a meal.         . montelukast (SINGULAIR) 10 MG tablet   Oral   Take 1 tablet (10 mg total) by mouth at bedtime. Patient taking differently: Take 10 mg by mouth at  bedtime as needed.    30 tablet   1   . pravastatin (PRAVACHOL) 40 MG tablet   Oral   Take 40 mg by mouth daily.         . vitamin B-12 (CYANOCOBALAMIN) 1000 MCG tablet   Oral   Take 1,000 mcg by mouth 2 (two) times daily.            Review of Systems  Constitutional: Negative for fever, chills and weight loss.  HENT: Negative for congestion and ear discharge.        Mild nasal drainage.   Eyes: Negative.  Negative for blurred vision.  Respiratory: Positive for cough. Negative for sputum production and shortness of breath.        Mild cough - more clearing of the throat  Cardiovascular: Negative for chest pain and palpitations.  Gastrointestinal: Negative for heartburn, nausea and vomiting.  Genitourinary: Negative.   Skin: Negative for itching and rash.  Neurological: Positive for tingling. Negative for headaches.  Endo/Heme/Allergies: Negative.   Psychiatric/Behavioral: Negative.       Allergies:  Codeine  Physical Examination:  VS: BP 132/70 mmHg  Pulse 107  Ht _0  (1.575 m)  Wt 123 lb (55.792 kg)  BMI 22.49 kg/m2  SpO2 92%  General Appearance: No distress  HEENT: PERRLA, no ptosis, no other lesions noticed Pulmonary:normal breath sounds., diaphragmatic excursion normal.No wheezing, No rales   Cardiovascular:  Normal S1,S2.  No m/r/g.     Abdomen:Exam: Benign,  Soft, non-tender, No masses  Skin:   warm, no rashes, no ecchymosis  Extremities: normal, no cyanosis, clubbing, warm with normal capillary refill.      Rad results: (The following images and results were reviewed by Dr. Stevenson Clinch). CT Chest 11/10/14 Dense consolidation with air bronchograms in the lingula has nearly resolved. There remains some mild linear scarring in the lingula. Air bronchograms have resolved. Patchy infiltrate in the left lower lobe has nearly resolved with a small amount of residual scarring in this area as well. Mild patchy infiltrates in the upper lobes bilaterally in the apices have resolved.  New area of infiltrate in the superior segment right lower lobe is mild but not present previously.  Negative for pleural effusion. Negative for adenopathy.  2 cm seroma left breast has improved considerably since the prior study.  Negative for thoracic fracture or mass lesion.  IMPRESSION: Considerable improvement in dense lingular consolidation with air bronchograms. Mild linear scarring remains in this area. Similar improvement left lower lobe infiltrate and biapical infiltrates.  New area of infiltrate in the superior segment right lower lobe which is mild.  The above areas are most consistent with infectious pneumonia given their multifocal pattern and location outside of the radiation field.     Assessment and Plan: URI, acute Her symptoms of cough, sputum production and mild fever, and inconjuction with a URI. She is at risk for CAP, given recent LUL radiation pneumonitis  Plan - CXR - if inflitrate noted, will add levaquin 562m, 1 tab PO x 7 days - cont with current dose of amoxicillin - incentive spirometry at home   Radiation pneumonitis Given the temporal relationship to the patient's symptoms and radiographic abnormalities along with negative cultures from bronchoscopy, I believe the patient does have a case of radiation pneumonitis,  for which she has been treated. Patient is current off steroids. Today with symptoms of URI, recently treated for bronchitis.  Plan: - repeat 2 view CXR - cont with current antibiotics -  no need for steroids at this time - see plan for URI.      Cough Initially thought to be due to radiation pneumonitis, but from further history she is having sinus drainage at times. She was advised to follow up with ENT at last visit, but did not.  Plan - cont with current allergy regiment - ENT follow up     Updated Medication List Outpatient Encounter Prescriptions as of 03/10/2015  Medication Sig  . acetaminophen (TYLENOL) 500 MG tablet Take 500 mg by mouth every 8 (eight) hours as needed for mild pain or moderate pain.  . benzonatate (TESSALON) 100 MG capsule Take 1 capsule by mouth 3 (three) times daily as needed for cough.   . Blood Glucose Monitoring Suppl (ONE TOUCH ULTRA 2) W/DEVICE KIT   . folic acid (FOLVITE) 1 MG tablet Take 1 mg by mouth daily.  Marland Kitchen gabapentin (NEURONTIN) 100 MG capsule Take 100 mg by mouth 3 (three) times daily.  Marland Kitchen GLUCAGON EMERGENCY 1 MG injection Inject 1 mg into the muscle once as needed.   . hydroxychloroquine (PLAQUENIL) 200 MG tablet Take 200 mg by mouth daily.  . insulin aspart (NOVOLOG) 100 UNIT/ML FlexPen Inject 2 Units into the skin as needed for high blood sugar (If sugar get 182 and above. Starts at 2 units. Pt knows how to calculate dosage). Per Sliding scale  . meloxicam (MOBIC) 7.5 MG tablet Take 7.5 mg by mouth daily as needed for pain.  . metFORMIN (GLUCOPHAGE) 500 MG tablet Take 1,000 mg by mouth 2 (two) times daily with a meal.  . montelukast (SINGULAIR) 10 MG tablet Take 1 tablet (10 mg total) by mouth at bedtime. (Patient taking differently: Take 10 mg by mouth at bedtime as needed. )  . pravastatin (PRAVACHOL) 40 MG tablet Take 40 mg by mouth daily.  . vitamin B-12 (CYANOCOBALAMIN) 1000 MCG tablet Take 1,000 mcg by mouth 2 (two) times daily.    No facility-administered encounter medications on file as of 03/10/2015.    Orders for this visit: No orders of the defined types were placed in this encounter.    Thank  you for the visitation and for allowing  [facility] to assist in the care of your patient. Our recommendations are noted above.  Please contact us if we can be of further service.  Vilinda Boehringer, MD  Pulmonary and Critical Care Office Number: [phone]

## 2015-03-10 NOTE — Patient Instructions (Signed)
Upper Respiratory Infection, Adult Most upper respiratory infections (URIs) are a viral infection of the air passages leading to the lungs. A URI affects the nose, throat, and upper air passages. The most common type of URI is nasopharyngitis and is typically referred to as "the common cold." URIs run their course and usually go away on their own. Most of the time, a URI does not require medical attention, but sometimes a bacterial infection in the upper airways can follow a viral infection. This is called a secondary infection. Sinus and middle ear infections are common types of secondary upper respiratory infections. Bacterial pneumonia can also complicate a URI. A URI can worsen asthma and chronic obstructive pulmonary disease (COPD). Sometimes, these complications can require emergency medical care and may be life threatening.  CAUSES Almost all URIs are caused by viruses. A virus is a type of germ and can spread from one person to another.  RISKS FACTORS You may be at risk for a URI if:   You smoke.   You have chronic heart or lung disease.  You have a weakened defense (immune) system.   You are very young or very old.   You have nasal allergies or asthma.  You work in crowded or poorly ventilated areas.  You work in health care facilities or schools. SIGNS AND SYMPTOMS  Symptoms typically develop 2-3 days after you come in contact with a cold virus. Most viral URIs last 7-10 days. However, viral URIs from the influenza virus (flu virus) can last 14-18 days and are typically more severe. Symptoms may include:   Runny or stuffy (congested) nose.   Sneezing.   Cough.   Sore throat.   Headache.   Fatigue.   Fever.   Loss of appetite.   Pain in your forehead, behind your eyes, and over your cheekbones (sinus pain).  Muscle aches.  DIAGNOSIS  Your health care provider may diagnose a URI by:  Physical exam.  Tests to check that your symptoms are not due to  another condition such as:  Strep throat.  Sinusitis.  Pneumonia.  Asthma. TREATMENT  A URI goes away on its own with time. It cannot be cured with medicines, but medicines may be prescribed or recommended to relieve symptoms. Medicines may help:  Reduce your fever.  Reduce your cough.  Relieve nasal congestion. HOME CARE INSTRUCTIONS   Take medicines only as directed by your health care provider.   Gargle warm saltwater or take cough drops to comfort your throat as directed by your health care provider.  Use a warm mist humidifier or inhale steam from a shower to increase air moisture. This may make it easier to breathe.  Drink enough fluid to keep your urine clear or pale yellow.   Eat soups and other clear broths and maintain good nutrition.   Rest as needed.   Return to work when your temperature has returned to normal or as your health care provider advises. You may need to stay home longer to avoid infecting others. You can also use a face mask and careful hand washing to prevent spread of the virus.  Increase the usage of your inhaler if you have asthma.   Do not use any tobacco products, including cigarettes, chewing tobacco, or electronic cigarettes. If you need help quitting, ask your health care provider. PREVENTION  The best way to protect yourself from getting a cold is to practice good hygiene.   Avoid oral or hand contact with people with cold   symptoms.   Wash your hands often if contact occurs.  There is no clear evidence that vitamin C, vitamin E, echinacea, or exercise reduces the chance of developing a cold. However, it is always recommended to get plenty of rest, exercise, and practice good nutrition.  SEEK MEDICAL CARE IF:   You are getting worse rather than better.   Your symptoms are not controlled by medicine.   You have chills.  You have worsening shortness of breath.  You have brown or red mucus.  You have yellow or brown nasal  discharge.  You have pain in your face, especially when you bend forward.  You have a fever.  You have swollen neck glands.  You have pain while swallowing.  You have white areas in the back of your throat. SEEK IMMEDIATE MEDICAL CARE IF:   You have severe or persistent:  Headache.  Ear pain.  Sinus pain.  Chest pain.  You have chronic lung disease and any of the following:  Wheezing.  Prolonged cough.  Coughing up blood.  A change in your usual mucus.  You have a stiff neck.  You have changes in your:  Vision.  Hearing.  Thinking.  Mood. MAKE SURE YOU:   Understand these instructions.  Will watch your condition.  Will get help right away if you are not doing well or get worse.     USE NASAL WAH IN MORNING, then USE NASAL SPRAYS AS DIRECTED STOP NASACORT START ZYRTEC AT [place]

## 2015-03-15 ENCOUNTER — Encounter: Payer: Self-pay | Admitting: Internal Medicine

## 2015-03-15 ENCOUNTER — Ambulatory Visit (INDEPENDENT_AMBULATORY_CARE_PROVIDER_SITE_OTHER): Payer: Medicare Other | Admitting: Internal Medicine

## 2015-03-15 VITALS — BP 132/70 | HR 84 | Temp 99.4°F | Ht 62.5 in | Wt 120.4 lb

## 2015-03-15 DIAGNOSIS — J189 Pneumonia, unspecified organism: Secondary | ICD-10-CM

## 2015-03-15 DIAGNOSIS — J42 Unspecified chronic bronchitis: Secondary | ICD-10-CM | POA: Diagnosis not present

## 2015-03-15 DIAGNOSIS — J069 Acute upper respiratory infection, unspecified: Secondary | ICD-10-CM | POA: Diagnosis not present

## 2015-03-15 MED ORDER — UMECLIDINIUM-VILANTEROL 62.5-25 MCG/INH IN AEPB: 1.0000 | INHALATION_SPRAY | Freq: Every day | RESPIRATORY_TRACT | Status: DC

## 2015-03-15 MED ORDER — CEFAZOLIN SODIUM-DEXTROSE 2-3 GM-% IV SOLR
2.0000 g | INTRAVENOUS | Status: AC
  Administered 2015-03-16: 2 g via INTRAVENOUS
  Filled 2015-03-15: qty 50

## 2015-03-15 MED ORDER — DEXAMETHASONE SODIUM PHOSPHATE 10 MG/ML IJ SOLN
10.0000 mg | INTRAMUSCULAR | Status: AC
  Administered 2015-03-16: 10 mg via INTRAVENOUS
  Filled 2015-03-15: qty 1

## 2015-03-15 NOTE — Addendum Note (Signed)
Addended by: Maryanna Shape A on: 03/15/2015 11:43 AM   Modules accepted: Orders

## 2015-03-15 NOTE — Patient Instructions (Addendum)
--  Use Anoro inhaler once daily.  --Continue current treatment for sinus drainage.  --Check CT chest without contrast in 4 weeks and follow up with Dr. Stevenson Clinch after.  --OK to proceed with surgery from respiratory standpoint.

## 2015-03-15 NOTE — Progress Notes (Signed)
* [facility]     Assessment and Plan:  Pre-op Pulmonary clearance.  --Given that she shows no signs of respiratory decompensation, I think she would be at moderate risk of pulmonary complications with surgery and could proceed with this from respiratory standpoint.   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 Metastatic disease.   --Will need a CT chest and possible bronchoscopy. This could be performed after her surgery.   Chronic bronchitis.  --Will start Anoro inhaler, given sample today.   Chronic rhinitis.  --Continue sinus rinse, zyrtec, and nasal spray per ENT recs.    Date: 03/15/2015  MRN# [id number] ROSELLE NORTON 04-28-1940   TANGEE MARSZALEK is a 74 y.o. old female seen in follow up for chief complaint of  Chief Complaint  Patient presents with  . Acute Visit    pt. states she is concern that she is not well enough for sugery tomorrow. low grade fever. prod. cough white in color. denies wheezing, SOb or chest pain/tightness.     HPI:   The patient is a 74 year old female with a history of radiation pneumonitis with changes on chest x-ray. She also had symptoms of a recent upper respiratory tract infection. She was seen in our office on 02/16/2015, and again on 03/10/2015 for clearance for an upcoming cervical neck surgery. At that time it was noted that she had changes on her imaging consistent with chronic changes from radiation. Since that time, she appears to have had a possible flare of acute bronchitis and was treated with antibiotics.  She notes that she has had a fever over the past month, she notes that she is "achy". She notes that she has chronic sinus drainage. She notes that she previously had a dry cough, but now has a wet cough which she brings up every morning.  She notes that her sinus drainage gets better with zyrtec, she also uses sinus rinse.   She notes no  trouble with breathing, she notes that she is having no problems with cough during the day. She feels that she is very nervous about having the surgery and she notes that she has already been cleared by 2 other pulmonary physicians. She notes that the only difference between now and then is that her cough is a bit more wet in the morning.  When she checks her temperature it is from 100.3 to 99.   She denies chest pain.  She denies hemoptysis.  She denies sputum production during the day.  She denies wheezing.   Summary of CT chest 11/09/13; CXR 02/15/15 images and reports reviewed:  There appears to be a left lingular/LLL opacity which was not present previously.   Desat walk performed today; beginning o2 sat was 92% on RA, and dropped to 90% with RA.   Medication:   Outpatient Encounter Prescriptions as of 03/15/2015  Medication Sig  . acetaminophen (TYLENOL) 500 MG tablet Take 500 mg by mouth every 8 (eight) hours as needed for mild pain or moderate pain.  . benzonatate (TESSALON) 100 MG capsule Take 1 capsule by mouth 3 (three) times daily as needed for cough.   . Blood Glucose Monitoring Suppl (ONE TOUCH ULTRA 2) W/DEVICE KIT   . cetirizine (ZYRTEC ALLERGY) 10 MG tablet Take 1 tablet (10 mg total) by mouth daily.  . folic acid (FOLVITE) 1 MG tablet Take 1 mg by mouth daily.  Marland Kitchen gabapentin (NEURONTIN) 100 MG capsule Take 100  mg by mouth 3 (three) times daily.  Marland Kitchen GLUCAGON EMERGENCY 1 MG injection Inject 1 mg into the muscle once as needed.   . hydroxychloroquine (PLAQUENIL) 200 MG tablet Take 200 mg by mouth daily.  . insulin aspart (NOVOLOG) 100 UNIT/ML FlexPen Inject 2 Units into the skin as needed for high blood sugar (If sugar get 182 and above. Starts at 2 units. Pt knows how to calculate dosage). Per Sliding scale  . ipratropium (ATROVENT) 0.03 % nasal spray Place 2 sprays into the nose 4 (four) times daily.  . meloxicam (MOBIC) 7.5 MG tablet Take 7.5 mg by mouth daily as needed for  pain.  . metFORMIN (GLUCOPHAGE) 500 MG tablet Take 1,000 mg by mouth 2 (two) times daily with a meal.  . montelukast (SINGULAIR) 10 MG tablet Take 1 tablet (10 mg total) by mouth at bedtime. (Patient taking differently: Take 10 mg by mouth at bedtime as needed. )  . pravastatin (PRAVACHOL) 40 MG tablet Take 40 mg by mouth daily.  . vitamin B-12 (CYANOCOBALAMIN) 1000 MCG tablet Take 1,000 mcg by mouth 2 (two) times daily.   Facility-Administered Encounter Medications as of 03/15/2015  Medication  . [START ON 03/16/2015] ceFAZolin (ANCEF) IVPB 2 g/50 mL premix  . [START ON 03/16/2015] dexamethasone (DECADRON) injection 10 mg     Allergies:  Codeine  Review of Systems: Gen:  Denies  fever, sweats. HEENT: Denies blurred vision. Cvc:  No dizziness, chest pain or heaviness Resp:   Denies cough or sputum porduction. Gi: Denies swallowing difficulty, stomach pain. constipation, bowel incontinence Gu:  Denies bladder incontinence, burning urine Ext:   No Joint pain, stiffness. Skin: No skin rash, easy bruising. Endoc:  No polyuria, polydipsia. Psych: No depression, insomnia. Other:  All other systems were reviewed and found to be negative other than what is mentioned in the HPI.   Physical Examination:   VS: BP 132/70 mmHg  Pulse 84  Temp(Src) 99.4 F (37.4 C)  Ht 5' 2.5" (1.588 m)  Wt 120 lb 6.4 oz (54.613 kg)  BMI 21.66 kg/m2  SpO2 97%  General Appearance: No distress  Neuro:without focal findings,  speech normal,  HEENT: PERRLA, EOM intact. Pulmonary: scattered bilateral crackles.   CardiovascularNormal S1,S2.  No m/r/g.   Abdomen: Benign, Soft, non-tender. Renal:  No costovertebral tenderness  GU:  Not performed at this time. Endoc: No evident thyromegaly, no signs of acromegaly. Skin:   warm, no rash. Extremities: normal, no cyanosis, clubbing.   LABORATORY PANEL:   CBC No results for input(s): WBC, HGB, HCT, PLT in the last 168  hours. ------------------------------------------------------------------------------------------------------------------  Chemistries  No results for input(s): NA, K, CL, CO2, GLUCOSE, BUN, CREATININE, CALCIUM, MG, AST, ALT, ALKPHOS, BILITOT in the last 168 hours.  Invalid input(s): GFRCGP ------------------------------------------------------------------------------------------------------------------  Cardiac Enzymes No results for input(s): TROPONINI in the last 168 hours. ------------------------------------------------------------  RADIOLOGY:   No results found for this or any previous visit. 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 Currently patient complains of nonproductive cough and low-grade fever  EXAM: CHEST  2 VIEW  COMPARISON:  CT chest 11/10/2014 and earlier. Chest x-ray 10/11/2014 and earlier.  FINDINGS: Cardiac silhouette upper normal in size, unchanged. Thoracic aorta mildly tortuous and atherosclerotic, unchanged. Hilar and mediastinal contours otherwise unremarkable. Interval development of airspace consolidation  with air bronchograms in the left lower lobe. No evidence of residual or recurrent opacity in the lingula at the site of prior radiation pneumonitis. New vague opacity peripherally in the left upper lobe approaching the apex. Right lung remains clear. No pleural effusions. Mild degenerative changes involving the thoracic spine.  IMPRESSION: 1. Pneumonia involving the left lower lobe and minimally the left upper lobe. 2. No evidence of residual or recurrent disease in the lingula at the site of prior radiation pneumonitis.   Electronically Signed   By: Evangeline Dakin M.D.   On: 02/15/2015 14:03     ------------------------------------------------------------------------------------------------------------------  Thank  you for allowing [facility] to assist in the care of your patient. Our recommendations are noted above.  Please contact us if we can be of further service.   Marda Stalker, MD.  [facility] Office Number: [phone]  Patricia Pesa, M.D.  Vilinda Boehringer, M.D.  Merton Border, M.D

## 2015-03-16 ENCOUNTER — Inpatient Hospital Stay (HOSPITAL_COMMUNITY): Payer: Medicare Other

## 2015-03-16 ENCOUNTER — Inpatient Hospital Stay (HOSPITAL_COMMUNITY): Payer: Medicare Other | Admitting: Anesthesiology

## 2015-03-16 ENCOUNTER — Inpatient Hospital Stay (HOSPITAL_COMMUNITY): Payer: Medicare Other | Admitting: Emergency Medicine

## 2015-03-16 ENCOUNTER — Encounter (HOSPITAL_COMMUNITY)
Admission: RE | Disposition: A | Discharge status: Home / Self Care | Payer: Self-pay | Source: Ambulatory Visit | Attending: Neurosurgery

## 2015-03-16 ENCOUNTER — Encounter (HOSPITAL_COMMUNITY): Payer: Self-pay | Admitting: *Deleted

## 2015-03-16 ENCOUNTER — Inpatient Hospital Stay (HOSPITAL_COMMUNITY)
Admission: RE | Admit: 2015-03-16 | Discharge: 2015-03-17 | DRG: 472 | Disposition: A | Discharge status: Home / Self Care | Payer: Medicare Other | Source: Ambulatory Visit | Attending: Neurosurgery | Admitting: Neurosurgery

## 2015-03-16 DIAGNOSIS — E119 Type 2 diabetes mellitus without complications: Secondary | ICD-10-CM | POA: Diagnosis present

## 2015-03-16 DIAGNOSIS — Z923 Personal history of irradiation: Secondary | ICD-10-CM | POA: Diagnosis not present

## 2015-03-16 DIAGNOSIS — Z794 Long term (current) use of insulin: Secondary | ICD-10-CM | POA: Diagnosis not present

## 2015-03-16 DIAGNOSIS — M4802 Spinal stenosis, cervical region: Secondary | ICD-10-CM | POA: Diagnosis present

## 2015-03-16 DIAGNOSIS — Z8052 Family history of malignant neoplasm of bladder: Secondary | ICD-10-CM

## 2015-03-16 DIAGNOSIS — Z8051 Family history of malignant neoplasm of kidney: Secondary | ICD-10-CM

## 2015-03-16 DIAGNOSIS — Z885 Allergy status to narcotic agent status: Secondary | ICD-10-CM | POA: Diagnosis not present

## 2015-03-16 DIAGNOSIS — J42 Unspecified chronic bronchitis: Secondary | ICD-10-CM | POA: Diagnosis present

## 2015-03-16 DIAGNOSIS — Z801 Family history of malignant neoplasm of trachea, bronchus and lung: Secondary | ICD-10-CM | POA: Diagnosis not present

## 2015-03-16 DIAGNOSIS — Z96653 Presence of artificial knee joint, bilateral: Secondary | ICD-10-CM | POA: Diagnosis present

## 2015-03-16 DIAGNOSIS — M4712 Other spondylosis with myelopathy, cervical region: Secondary | ICD-10-CM | POA: Diagnosis present

## 2015-03-16 DIAGNOSIS — Z981 Arthrodesis status: Secondary | ICD-10-CM

## 2015-03-16 DIAGNOSIS — I10 Essential (primary) hypertension: Secondary | ICD-10-CM | POA: Diagnosis present

## 2015-03-16 DIAGNOSIS — J310 Chronic rhinitis: Secondary | ICD-10-CM | POA: Diagnosis present

## 2015-03-16 DIAGNOSIS — E785 Hyperlipidemia, unspecified: Secondary | ICD-10-CM | POA: Diagnosis present

## 2015-03-16 DIAGNOSIS — M069 Rheumatoid arthritis, unspecified: Secondary | ICD-10-CM | POA: Diagnosis present

## 2015-03-16 DIAGNOSIS — Z7984 Long term (current) use of oral hypoglycemic drugs: Secondary | ICD-10-CM | POA: Diagnosis not present

## 2015-03-16 DIAGNOSIS — M5001 Cervical disc disorder with myelopathy,  high cervical region: Secondary | ICD-10-CM | POA: Diagnosis present

## 2015-03-16 DIAGNOSIS — Z853 Personal history of malignant neoplasm of breast: Secondary | ICD-10-CM

## 2015-03-16 HISTORY — PX: ANTERIOR CERVICAL DECOMP/DISCECTOMY FUSION: SHX1161

## 2015-03-16 HISTORY — DX: Acute pulmonary manifestations due to radiation: J70.0

## 2015-03-16 LAB — GLUCOSE, CAPILLARY
GLUCOSE-CAPILLARY: 124 mg/dL — AB (ref 65–99)
Glucose-Capillary: 112 mg/dL — ABNORMAL HIGH (ref 65–99)
Glucose-Capillary: 165 mg/dL — ABNORMAL HIGH (ref 65–99)
Glucose-Capillary: 232 mg/dL — ABNORMAL HIGH (ref 65–99)

## 2015-03-16 SURGERY — ANTERIOR CERVICAL DECOMPRESSION/DISCECTOMY FUSION 2 LEVELS
Anesthesia: General | Site: Spine Cervical

## 2015-03-16 MED ORDER — NEOSTIGMINE METHYLSULFATE 10 MG/10ML IV SOLN
INTRAVENOUS | Status: DC | PRN
  Administered 2015-03-16: 2.5 mg via INTRAVENOUS

## 2015-03-16 MED ORDER — HYDROXYCHLOROQUINE SULFATE 200 MG PO TABS
200.0000 mg | ORAL_TABLET | Freq: Every day | ORAL | Status: DC
  Filled 2015-03-16: qty 1

## 2015-03-16 MED ORDER — SODIUM CHLORIDE 0.9 % IJ SOLN
3.0000 mL | Freq: Two times a day (BID) | INTRAMUSCULAR | Status: DC
  Administered 2015-03-16: 3 mL via INTRAVENOUS

## 2015-03-16 MED ORDER — SCOPOLAMINE 1 MG/3DAYS TD PT72
MEDICATED_PATCH | TRANSDERMAL | Status: AC
  Filled 2015-03-16: qty 1

## 2015-03-16 MED ORDER — ARTIFICIAL TEARS OP OINT
TOPICAL_OINTMENT | OPHTHALMIC | Status: AC
  Filled 2015-03-16: qty 3.5

## 2015-03-16 MED ORDER — CYCLOBENZAPRINE HCL 10 MG PO TABS
ORAL_TABLET | ORAL | Status: AC
  Filled 2015-03-16: qty 1

## 2015-03-16 MED ORDER — LIDOCAINE HCL (CARDIAC) 20 MG/ML IV SOLN
INTRAVENOUS | Status: DC | PRN
  Administered 2015-03-16: 100 mg via INTRAVENOUS

## 2015-03-16 MED ORDER — HYDROCODONE-ACETAMINOPHEN 5-325 MG PO TABS
1.0000 | ORAL_TABLET | ORAL | Status: DC | PRN
  Administered 2015-03-17: 2 via ORAL
  Filled 2015-03-16: qty 2

## 2015-03-16 MED ORDER — FENTANYL CITRATE (PF) 100 MCG/2ML IJ SOLN
INTRAMUSCULAR | Status: DC | PRN
  Administered 2015-03-16: 100 ug via INTRAVENOUS
  Administered 2015-03-16 (×3): 50 ug via INTRAVENOUS

## 2015-03-16 MED ORDER — SODIUM CHLORIDE 0.9 % IJ SOLN
INTRAMUSCULAR | Status: AC
  Filled 2015-03-16: qty 10

## 2015-03-16 MED ORDER — FENTANYL CITRATE (PF) 100 MCG/2ML IJ SOLN
25.0000 ug | INTRAMUSCULAR | Status: AC | PRN
  Administered 2015-03-16 (×6): 25 ug via INTRAVENOUS

## 2015-03-16 MED ORDER — ALBUTEROL SULFATE HFA 108 (90 BASE) MCG/ACT IN AERS
INHALATION_SPRAY | RESPIRATORY_TRACT | Status: DC | PRN
  Administered 2015-03-16: 2 via RESPIRATORY_TRACT

## 2015-03-16 MED ORDER — ONDANSETRON HCL 4 MG/2ML IJ SOLN
INTRAMUSCULAR | Status: AC
  Filled 2015-03-16: qty 2

## 2015-03-16 MED ORDER — SODIUM CHLORIDE 0.9 % IR SOLN
Status: DC | PRN
  Administered 2015-03-16: 500 mL

## 2015-03-16 MED ORDER — PROPOFOL 10 MG/ML IV BOLUS
INTRAVENOUS | Status: DC | PRN
  Administered 2015-03-16: 100 mg via INTRAVENOUS

## 2015-03-16 MED ORDER — INSULIN ASPART 100 UNIT/ML ~~LOC~~ SOLN: 0.0000 [IU] | Freq: Three times a day (TID) | SUBCUTANEOUS | Status: DC

## 2015-03-16 MED ORDER — PROPOFOL 10 MG/ML IV BOLUS
INTRAVENOUS | Status: AC
  Filled 2015-03-16: qty 20

## 2015-03-16 MED ORDER — SODIUM CHLORIDE 0.9 % IV SOLN: 250.0000 mL | INTRAVENOUS | Status: DC

## 2015-03-16 MED ORDER — PRAVASTATIN SODIUM 40 MG PO TABS
40.0000 mg | ORAL_TABLET | Freq: Every day | ORAL | Status: DC
  Administered 2015-03-16: 40 mg via ORAL
  Filled 2015-03-16: qty 1

## 2015-03-16 MED ORDER — LIDOCAINE HCL 4 % MT SOLN
OROMUCOSAL | Status: DC | PRN
  Administered 2015-03-16: 3 mL via TOPICAL

## 2015-03-16 MED ORDER — DIPHENHYDRAMINE HCL 25 MG PO CAPS
25.0000 mg | ORAL_CAPSULE | Freq: Four times a day (QID) | ORAL | Status: DC | PRN
  Administered 2015-03-16 (×2): 25 mg via ORAL
  Filled 2015-03-16 (×2): qty 1

## 2015-03-16 MED ORDER — ONDANSETRON HCL 4 MG/2ML IJ SOLN
INTRAMUSCULAR | Status: DC | PRN
  Administered 2015-03-16: 4 mg via INTRAVENOUS

## 2015-03-16 MED ORDER — FOLIC ACID 1 MG PO TABS
1.0000 mg | ORAL_TABLET | Freq: Every day | ORAL | Status: DC
  Filled 2015-03-16: qty 1

## 2015-03-16 MED ORDER — THROMBIN 5000 UNITS EX SOLR
CUTANEOUS | Status: DC | PRN
  Administered 2015-03-16 (×2): 5000 [IU] via TOPICAL

## 2015-03-16 MED ORDER — IPRATROPIUM BROMIDE 0.03 % NA SOLN
2.0000 | Freq: Four times a day (QID) | NASAL | Status: DC
  Filled 2015-03-16: qty 30

## 2015-03-16 MED ORDER — FENTANYL CITRATE (PF) 100 MCG/2ML IJ SOLN
INTRAMUSCULAR | Status: AC
  Filled 2015-03-16: qty 2

## 2015-03-16 MED ORDER — UMECLIDINIUM-VILANTEROL 62.5-25 MCG/INH IN AEPB: 1.0000 | INHALATION_SPRAY | Freq: Every day | RESPIRATORY_TRACT | Status: DC

## 2015-03-16 MED ORDER — SODIUM CHLORIDE 0.9 % IJ SOLN: 3.0000 mL | INTRAMUSCULAR | Status: DC | PRN

## 2015-03-16 MED ORDER — ACETAMINOPHEN 325 MG PO TABS: 650.0000 mg | ORAL_TABLET | ORAL | Status: DC | PRN

## 2015-03-16 MED ORDER — LACTATED RINGERS IV SOLN
INTRAVENOUS | Status: DC
  Administered 2015-03-16: 09:00:00 via INTRAVENOUS

## 2015-03-16 MED ORDER — LORATADINE 10 MG PO TABS: 10.0000 mg | ORAL_TABLET | Freq: Every day | ORAL | Status: DC

## 2015-03-16 MED ORDER — INSULIN ASPART 100 UNIT/ML ~~LOC~~ SOLN
0.0000 [IU] | Freq: Every day | SUBCUTANEOUS | Status: DC
  Administered 2015-03-16: 2 [IU] via SUBCUTANEOUS

## 2015-03-16 MED ORDER — FENTANYL CITRATE (PF) 250 MCG/5ML IJ SOLN
INTRAMUSCULAR | Status: AC
  Filled 2015-03-16: qty 5

## 2015-03-16 MED ORDER — ROCURONIUM BROMIDE 100 MG/10ML IV SOLN
INTRAVENOUS | Status: DC | PRN
  Administered 2015-03-16: 30 mg via INTRAVENOUS

## 2015-03-16 MED ORDER — ARTIFICIAL TEARS OP OINT
TOPICAL_OINTMENT | OPHTHALMIC | Status: DC | PRN
  Administered 2015-03-16: 1 via OPHTHALMIC

## 2015-03-16 MED ORDER — CEFAZOLIN SODIUM 1-5 GM-% IV SOLN
1.0000 g | Freq: Three times a day (TID) | INTRAVENOUS | Status: AC
  Administered 2015-03-16 – 2015-03-17 (×2): 1 g via INTRAVENOUS
  Filled 2015-03-16 (×2): qty 50

## 2015-03-16 MED ORDER — SUCCINYLCHOLINE CHLORIDE 20 MG/ML IJ SOLN
INTRAMUSCULAR | Status: AC
  Filled 2015-03-16: qty 1

## 2015-03-16 MED ORDER — HYDROMORPHONE HCL 1 MG/ML IJ SOLN: 0.5000 mg | INTRAMUSCULAR | Status: DC | PRN

## 2015-03-16 MED ORDER — INSULIN ASPART 100 UNIT/ML FLEXPEN: 2.0000 [IU] | PEN_INJECTOR | SUBCUTANEOUS | Status: DC | PRN

## 2015-03-16 MED ORDER — 0.9 % SODIUM CHLORIDE (POUR BTL) OPTIME
TOPICAL | Status: DC | PRN
  Administered 2015-03-16: 1000 mL

## 2015-03-16 MED ORDER — LACTATED RINGERS IV SOLN
INTRAVENOUS | Status: DC | PRN
  Administered 2015-03-16 (×2): via INTRAVENOUS

## 2015-03-16 MED ORDER — MONTELUKAST SODIUM 10 MG PO TABS
10.0000 mg | ORAL_TABLET | Freq: Every evening | ORAL | Status: DC | PRN
  Filled 2015-03-16: qty 1

## 2015-03-16 MED ORDER — EPHEDRINE SULFATE 50 MG/ML IJ SOLN
INTRAMUSCULAR | Status: DC | PRN
  Administered 2015-03-16 (×2): 5 mg via INTRAVENOUS

## 2015-03-16 MED ORDER — ALBUTEROL SULFATE HFA 108 (90 BASE) MCG/ACT IN AERS
INHALATION_SPRAY | RESPIRATORY_TRACT | Status: AC
  Filled 2015-03-16: qty 6.7

## 2015-03-16 MED ORDER — PHENOL 1.4 % MT LIQD
1.0000 | OROMUCOSAL | Status: DC | PRN
Start: 2015-03-16 — End: 2015-03-17

## 2015-03-16 MED ORDER — OXYCODONE-ACETAMINOPHEN 5-325 MG PO TABS
1.0000 | ORAL_TABLET | ORAL | Status: DC | PRN
  Administered 2015-03-16 (×2): 2 via ORAL
  Filled 2015-03-16: qty 2

## 2015-03-16 MED ORDER — GABAPENTIN 100 MG PO CAPS
100.0000 mg | ORAL_CAPSULE | Freq: Three times a day (TID) | ORAL | Status: DC
  Administered 2015-03-16 (×2): 100 mg via ORAL
  Filled 2015-03-16 (×2): qty 1

## 2015-03-16 MED ORDER — VITAMIN B-12 1000 MCG PO TABS
1000.0000 ug | ORAL_TABLET | Freq: Two times a day (BID) | ORAL | Status: DC
  Administered 2015-03-16: 1000 ug via ORAL
  Filled 2015-03-16 (×3): qty 1

## 2015-03-16 MED ORDER — ONDANSETRON HCL 4 MG/2ML IJ SOLN: 4.0000 mg | INTRAMUSCULAR | Status: DC | PRN

## 2015-03-16 MED ORDER — CYCLOBENZAPRINE HCL 10 MG PO TABS
10.0000 mg | ORAL_TABLET | Freq: Three times a day (TID) | ORAL | Status: DC | PRN
  Administered 2015-03-16 (×2): 10 mg via ORAL
  Filled 2015-03-16: qty 1

## 2015-03-16 MED ORDER — METFORMIN HCL 500 MG PO TABS
1000.0000 mg | ORAL_TABLET | Freq: Two times a day (BID) | ORAL | Status: DC
  Administered 2015-03-16 – 2015-03-17 (×2): 1000 mg via ORAL
  Filled 2015-03-16 (×3): qty 2

## 2015-03-16 MED ORDER — MENTHOL 3 MG MT LOZG: 1.0000 | LOZENGE | OROMUCOSAL | Status: DC | PRN

## 2015-03-16 MED ORDER — GLYCOPYRROLATE 0.2 MG/ML IJ SOLN
INTRAMUSCULAR | Status: DC | PRN
  Administered 2015-03-16: 0.3 mg via INTRAVENOUS

## 2015-03-16 MED ORDER — FENTANYL CITRATE (PF) 250 MCG/5ML IJ SOLN
INTRAMUSCULAR | Status: DC | PRN
  Administered 2015-03-16: 50 ug via INTRAVENOUS

## 2015-03-16 MED ORDER — ONDANSETRON HCL 4 MG/2ML IJ SOLN: 4.0000 mg | Freq: Once | INTRAMUSCULAR | Status: DC | PRN

## 2015-03-16 MED ORDER — SUCCINYLCHOLINE CHLORIDE 20 MG/ML IJ SOLN
INTRAMUSCULAR | Status: DC | PRN
  Administered 2015-03-16: 100 mg via INTRAVENOUS

## 2015-03-16 MED ORDER — BENZONATATE 100 MG PO CAPS: 100.0000 mg | ORAL_CAPSULE | Freq: Three times a day (TID) | ORAL | Status: DC | PRN

## 2015-03-16 MED ORDER — HEMOSTATIC AGENTS (NO CHARGE) OPTIME
TOPICAL | Status: DC | PRN
  Administered 2015-03-16: 1 via TOPICAL

## 2015-03-16 MED ORDER — EPHEDRINE SULFATE 50 MG/ML IJ SOLN
INTRAMUSCULAR | Status: AC
  Filled 2015-03-16: qty 1

## 2015-03-16 MED ORDER — ACETAMINOPHEN 650 MG RE SUPP: 650.0000 mg | RECTAL | Status: DC | PRN

## 2015-03-16 MED ORDER — MIDAZOLAM HCL 2 MG/2ML IJ SOLN
INTRAMUSCULAR | Status: AC
  Filled 2015-03-16: qty 2

## 2015-03-16 MED ORDER — OXYCODONE-ACETAMINOPHEN 5-325 MG PO TABS
ORAL_TABLET | ORAL | Status: AC
  Filled 2015-03-16: qty 2

## 2015-03-16 MED ORDER — MIDAZOLAM HCL 5 MG/5ML IJ SOLN
INTRAMUSCULAR | Status: DC | PRN
  Administered 2015-03-16: 2 mg via INTRAVENOUS

## 2015-03-16 MED ORDER — PHENYLEPHRINE HCL 10 MG/ML IJ SOLN
10.0000 mg | INTRAMUSCULAR | Status: DC | PRN
  Administered 2015-03-16: 20 ug/min via INTRAVENOUS

## 2015-03-16 SURGICAL SUPPLY — 61 items
APL SKNCLS STERI-STRIP NONHPOA (GAUZE/BANDAGES/DRESSINGS) ×1
BAG DECANTER FOR FLEXI CONT (MISCELLANEOUS) ×3 IMPLANT
BENZOIN TINCTURE PRP APPL 2/3 (GAUZE/BANDAGES/DRESSINGS) ×3 IMPLANT
BIT DRILL 13 (BIT) ×1 IMPLANT
BIT DRILL 13MM (BIT) ×1
BRUSH SCRUB EZ PLAIN DRY (MISCELLANEOUS) ×3 IMPLANT
BUR MATCHSTICK NEURO 3.0 LAGG (BURR) ×3 IMPLANT
CAGE PEEK 7X14X11 (Cage) ×6 IMPLANT
CANISTER SUCT 3000ML PPV (MISCELLANEOUS) ×3 IMPLANT
CLOSURE WOUND 1/2 X4 (GAUZE/BANDAGES/DRESSINGS) ×1
DRAPE C-ARM 42X72 X-RAY (DRAPES) ×6 IMPLANT
DRAPE LAPAROTOMY 100X72 PEDS (DRAPES) ×3 IMPLANT
DRAPE MICROSCOPE LEICA (MISCELLANEOUS) ×3 IMPLANT
DRAPE POUCH INSTRU U-SHP 10X18 (DRAPES) ×3 IMPLANT
DURAPREP 6ML APPLICATOR 50/CS (WOUND CARE) ×3 IMPLANT
ELECT COATED BLADE 2.86 ST (ELECTRODE) ×3 IMPLANT
ELECT REM PT RETURN 9FT ADLT (ELECTROSURGICAL) ×3
ELECTRODE REM PT RTRN 9FT ADLT (ELECTROSURGICAL) ×1 IMPLANT
GAUZE SPONGE 4X4 12PLY STRL (GAUZE/BANDAGES/DRESSINGS) ×3 IMPLANT
GAUZE SPONGE 4X4 16PLY XRAY LF (GAUZE/BANDAGES/DRESSINGS) IMPLANT
GLOVE BIOGEL M 8.0 STRL (GLOVE) ×2 IMPLANT
GLOVE BIOGEL PI IND STRL 6.5 (GLOVE) IMPLANT
GLOVE BIOGEL PI IND STRL 7.0 (GLOVE) IMPLANT
GLOVE BIOGEL PI INDICATOR 6.5 (GLOVE) ×2
GLOVE BIOGEL PI INDICATOR 7.0 (GLOVE) ×2
GLOVE ECLIPSE 9.0 STRL (GLOVE) ×3 IMPLANT
GLOVE EXAM NITRILE LRG STRL (GLOVE) IMPLANT
GLOVE EXAM NITRILE MD LF STRL (GLOVE) IMPLANT
GLOVE EXAM NITRILE XL STR (GLOVE) IMPLANT
GLOVE EXAM NITRILE XS STR PU (GLOVE) IMPLANT
GOWN STRL REUS W/ TWL LRG LVL3 (GOWN DISPOSABLE) IMPLANT
GOWN STRL REUS W/ TWL XL LVL3 (GOWN DISPOSABLE) ×1 IMPLANT
GOWN STRL REUS W/TWL 2XL LVL3 (GOWN DISPOSABLE) IMPLANT
GOWN STRL REUS W/TWL LRG LVL3 (GOWN DISPOSABLE) ×3
GOWN STRL REUS W/TWL XL LVL3 (GOWN DISPOSABLE) ×6
HALTER HD/CHIN CERV TRACTION D (MISCELLANEOUS) ×3 IMPLANT
HEMOSTAT SURGICEL 2X14 (HEMOSTASIS) IMPLANT
KIT BASIN OR (CUSTOM PROCEDURE TRAY) ×3 IMPLANT
KIT ROOM TURNOVER OR (KITS) ×3 IMPLANT
NDL SPNL 20GX3.5 QUINCKE YW (NEEDLE) ×1 IMPLANT
NEEDLE SPNL 20GX3.5 QUINCKE YW (NEEDLE) ×3 IMPLANT
NS IRRIG 1000ML POUR BTL (IV SOLUTION) ×3 IMPLANT
PACK LAMINECTOMY NEURO (CUSTOM PROCEDURE TRAY) ×3 IMPLANT
PAD ARMBOARD 7.5X6 YLW CONV (MISCELLANEOUS) ×9 IMPLANT
PLATE ELITE 42MM (Plate) ×2 IMPLANT
RUBBERBAND STERILE (MISCELLANEOUS) ×6 IMPLANT
SCREW ST 13X4XST VA NS SPNE (Screw) IMPLANT
SCREW ST VAR 4 ATL (Screw) ×18 IMPLANT
SPACER SPNL 11X14X7XPEEK CVD (Cage) IMPLANT
SPCR SPNL 11X14X7XPEEK CVD (Cage) ×2 IMPLANT
SPONGE INTESTINAL PEANUT (DISPOSABLE) ×5 IMPLANT
SPONGE SURGIFOAM ABS GEL SZ50 (HEMOSTASIS) ×3 IMPLANT
STRIP CLOSURE SKIN 1/2X4 (GAUZE/BANDAGES/DRESSINGS) ×2 IMPLANT
SUT VIC AB 3-0 SH 8-18 (SUTURE) ×3 IMPLANT
SUT VIC AB 4-0 RB1 18 (SUTURE) ×3 IMPLANT
TAPE CLOTH 4X10 WHT NS (GAUZE/BANDAGES/DRESSINGS) ×1 IMPLANT
TAPE CLOTH SURG 4X10 WHT LF (GAUZE/BANDAGES/DRESSINGS) ×2 IMPLANT
TOWEL OR 17X24 6PK STRL BLUE (TOWEL DISPOSABLE) ×3 IMPLANT
TOWEL OR 17X26 10 PK STRL BLUE (TOWEL DISPOSABLE) ×3 IMPLANT
TRAP SPECIMEN MUCOUS 40CC (MISCELLANEOUS) ×3 IMPLANT
WATER STERILE IRR 1000ML POUR (IV SOLUTION) ×3 IMPLANT

## 2015-03-16 NOTE — Transfer of Care (Signed)
Immediate Anesthesia Transfer of Care Note  Patient: Ashley Bray  Procedure(s) Performed: Procedure(s): ANTERIOR  CERVICAL DECOMPRESSION FUSION CERVICAL TWO-THREE,CERVICAL THREE-FOUR WITH REMOVAL OF HARDWARE AT CERVICAL FOUR-FIVE. (N/A)  Patient Location: PACU  Anesthesia Type:General  Level of Consciousness: awake, oriented, sedated, patient cooperative and responds to stimulation  Airway & Oxygen Therapy: Patient Spontanous Breathing and Patient connected to nasal cannula oxygen  Post-op Assessment: Report given to RN, Post -op Vital signs reviewed and stable, Patient moving all extremities and Patient moving all extremities X 4  Post vital signs: Reviewed and stable  Last Vitals:  Filed Vitals:   03/16/15 0752 03/16/15 1223  BP: 120/92 136/65  Pulse: 84 88  Temp: 36.3 C 36.5 C  Resp: 20 20    Complications: No apparent anesthesia complications

## 2015-03-16 NOTE — Op Note (Signed)
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  Reexploration of C4-5 anterior cervical fusion with removal of hardware  Surgeon:Jaki Steptoe A.Gabrial Domine, M.D.  Asst. Surgeon: Joya Salm  Anesthesia: General  Indication: 74 year old female status post previous C4-5 anterior cervical discectomy and fusion remotely in the past presents with worsening neck and bilateral upper extremity pain. Workup demonstrates evidence of progressive spondylosis and stenosis at C2-3 and C3-4 with significant spinal cord compression. Patient presents now for two-level anterior cervical decompression and fusion.  Operative note: After induction of anesthesia, patient positioned supine with her neck slightly extended and held in place with halter traction. Anterior cervical region prepped and draped sterilely. Incision reopened on the right side. Dissection performed on the right side. Previously placed at L[place]us anterior cervical plate was dissected free and removed. Fusion at C4-5 was inspected and found to be solid. Longus colli muscles were then elevated at C2-3 and C3-4. Retractor was placed. Fluoroscopy is used. Levels were confirmed. Discectomies and performed using various curettes and rongeurs to remove the disc down to level of the posterior annulus. Microscope then brought into field use at the remainder of the discectomy. Remaining aspects of annulus and osteophytes removed using high-speed drill down to level posterior lateral ligament. Posterior lateral and was then elevated and resected piecemeal fashion. Underlying thecal sac was identified. Wide central decompression then performed by undercutting the bodies of C2 and C3.  Decompression then proceeded into each neural foramen. Foraminotomies were ensured to be adequate and Gelfoam was placed for hemostasis. Procedures and repeated at C3-4 again without complication. 7 mm Medtronic anatomic peek cages were packed with locally harvested autograft and impacted into place. Each cage was impacted flush with the anterior cortical margin of C2-3 and 4. A 42 mm Atlantis anterior cervical plate was then placed over the C2-3-4 and 5 levels. This is an attachment or fluoroscopic guidance using 13 variable angle screws at all 3 levels. All 6 screws given a final tightening found to be solidly within the bone. Locking screws and gauge at all 3 levels. Final images revealed good position the bone graft and hardware at the proper upper level with normal alignment of the spine. Wound is irrigated one final time. Then closed in typical fashion. Steri-Strips and sterile dressing were applied. No apparent complications. The patient tolerated the procedure well and she returns to the recovery room postop.

## 2015-03-16 NOTE — H&P (Signed)
Ashley Bray is an 74 y.o. female.   Chief Complaint: Neck pain HPI: 74 year old female with severe neck pain with associated occipital radiation. Workup demonstrates evidence of marked spondylosis with stenosis and spinal cord compression at C2-3 and C3-4. Patient is status post previous fusion at C4-5 and has spontaneous interbody fusion at C5-6. Patient has failed conservative management and presents now for C2-3 and C3-4 anterior cervical decompression and fusion.  Past Medical History  Diagnosis Date  . Diabetes ([place])   . HLD (hyperlipidemia)   . Bray (rheumatoid arthritis) ([place])   . Breast CA ([place])     left  . 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 PONV (postoperative nausea and vomiting)   . Hypertension     NO MEDS  . Pneumonia     SMALL SPOT ON LUNG, HX RADIATION PNEUMONITIS   . Radiation pneumonitis [facility])     Past Surgical History  Procedure Laterality Date  . Replacement total knee bilateral      x2  . Rotator cuff repair Right   . Carpal tunnel release Bilateral   . Neck fusion    . Appendectomy    . Abdominal hysterectomy  1991  . Breast lumpectomy    . Breast biopsy Left 08/31/2013    positive    Family History  Problem Relation Age of Onset  . Cancer Mother     kidney or bladder-unsure of which one  . Cancer Brother 32    lung  . Cancer Brother 59    lung   Social History:  reports that she has never smoked. She has never used smokeless tobacco. She reports that she drinks about 1.2 oz of alcohol per week. She reports that she does not use illicit drugs.  Allergies:  Allergies  Allergen Reactions  . Codeine Nausea And Vomiting    Medications Prior to Admission  Medication Sig Dispense Refill  . acetaminophen (TYLENOL) 500 MG tablet Take 500 mg by mouth every 8 (eight) hours as needed for mild pain or moderate pain.    . benzonatate (TESSALON) 100 MG capsule Take 1 capsule by mouth 3 (three)  times daily as needed for cough.   0  . Blood Glucose Monitoring Suppl (ONE TOUCH ULTRA 2) W/DEVICE KIT   11  . cetirizine (ZYRTEC ALLERGY) 10 MG tablet Take 1 tablet (10 mg total) by mouth daily. 30 tablet 12  . folic acid (FOLVITE) 1 MG tablet Take 1 mg by mouth daily.  11  . gabapentin (NEURONTIN) 100 MG capsule Take 100 mg by mouth 3 (three) times daily.    Marland Kitchen GLUCAGON EMERGENCY 1 MG injection Inject 1 mg into the muscle once as needed.   0  . hydroxychloroquine (PLAQUENIL) 200 MG tablet Take 200 mg by mouth daily.    Marland Kitchen ipratropium (ATROVENT) 0.03 % nasal spray Place 2 sprays into the nose 4 (four) times daily. 30 mL 1  . metFORMIN (GLUCOPHAGE) 500 MG tablet Take 1,000 mg by mouth 2 (two) times daily with a meal.    . montelukast (SINGULAIR) 10 MG tablet Take 1 tablet (10 mg total) by mouth at bedtime. (Patient taking differently: Take 10 mg by mouth at bedtime as needed. ) 30 tablet 1  . pravastatin (PRAVACHOL) 40 MG tablet Take 40 mg by mouth daily.    Marland Kitchen Umeclidinium-Vilanterol (ANORO ELLIPTA) 62.5-25 MCG/INH AEPB Inhale 1 puff into the lungs daily. 60 each  2  . vitamin B-12 (CYANOCOBALAMIN) 1000 MCG tablet Take 1,000 mcg by mouth 2 (two) times daily.    . insulin aspart (NOVOLOG) 100 UNIT/ML FlexPen Inject 2 Units into the skin as needed for high blood sugar (If sugar get 182 and above. Starts at 2 units. Pt knows how to calculate dosage). Per Sliding scale    . meloxicam (MOBIC) 7.5 MG tablet Take 7.5 mg by mouth daily as needed for pain.    Marland Kitchen Umeclidinium-Vilanterol (ANORO ELLIPTA) 62.5-25 MCG/INH AEPB Inhale 1 puff into the lungs daily. 7 each 0    Results for orders placed or performed during the hospital encounter of 03/16/15 (from the past 48 hour(s))  Glucose, capillary     Status: Abnormal   Collection Time: 03/16/15  7:57 AM  Result Value Ref Range   Glucose-Capillary 124 (H) 65 - 99 mg/dL   No results found.  Pertinent items noted in HPI and remainder of comprehensive ROS  otherwise negative.  Blood pressure 120/92, pulse 84, temperature 97.4 F (36.3 C), temperature source Oral, resp. rate 20, weight 54.432 kg (120 lb), SpO2 97 %.  The patient is awake and alert. She is oriented and appropriate. Her cranial nerve function is intact. Her motor and sensory function of the extremities are grossly normal aside from some mild decreased sensation in both distal upper extremities. Reflexes are normal bilaterally. There is no evidence for long tract signs. Gait is slow and somewhat shuffling. Posture is reasonably normal. Examination head ears eyes and thirds unremarkable. Chest and abdomen are benign. Extremities are free from injury or deformity. 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 Plan C2-3, C3-4 anterior cervical discectomy and interbody fusion utilizing interbody peek cages, local harvested autograft, and anterior plate instrumentation. This will require removal of her previous C4-5 anterior cervical plate. Risks and benefits of been explained. Patient wishes to proceed.  Ashley Bray A 03/16/2015, 10:03 AM

## 2015-03-16 NOTE — Brief Op Note (Signed)
03/16/2015  12:07 PM  PATIENT:  Ashley Bray  74 y.o. female  PRE-OPERATIVE DIAGNOSIS:  Stenosis  POST-OPERATIVE DIAGNOSIS:  stenosis  PROCEDURE:  Procedure(s): ANTERIOR  CERVICAL DECOMPRESSION FUSION CERVICAL TWO-THREE,CERVICAL THREE-FOUR WITH REMOVAL OF HARDWARE AT CERVICAL FOUR-FIVE. (N/A)  SURGEON:  Surgeon(s) and Role:    * Earnie Larsson, MD - Primary    * Leeroy Cha, MD - Assisting  PHYSICIAN ASSISTANT:   ASSISTANTS:    ANESTHESIA:   general  EBL:  Total I/O In: 1000 [I.V.:1000] Out: -   BLOOD ADMINISTERED:none  DRAINS: none   LOCAL MEDICATIONS USED:       SPECIMEN:  No Specimen  DISPOSITION OF SPECIMEN:  N/A  COUNTS:  YES  TOURNIQUET:  * No tourniquets in log *  DICTATION: .Dragon Dictation  PLAN OF CARE: Admit to inpatient   PATIENT DISPOSITION:  PACU - hemodynamically stable.   Delay start of Pharmacological VTE agent (>24hrs) due to surgical blood loss or risk of bleeding: yes

## 2015-03-16 NOTE — Anesthesia Preprocedure Evaluation (Addendum)
Anesthesia Evaluation  Patient identified by MRN, date of birth, ID band Patient awake    Reviewed: Allergy & Precautions, NPO status , Patient's Chart, lab work & pertinent test results  History of Anesthesia Complications (+) PONV and history of anesthetic complications  Airway Mallampati: II  TM Distance: >3 FB Neck ROM: Full    Dental  (+) Dental Advisory Given, Partial Upper   Pulmonary pneumonia, unresolved, Recent URI ,  03/15/15 note from Pulmonology: Pre-op Pulmonary clearance.  --Given that she shows no signs of respiratory decompensation, I think she would be at moderate risk of pulmonary complications with surgery and could proceed with this from respiratory standpoint.   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 Metastatic disease.  --Will need a CT chest and possible bronchoscopy. This could be performed after her surgery.   Chronic bronchitis.  --Will start Anoro inhaler, given sample today.   Chronic rhinitis.  --Continue sinus rinse, zyrtec, and nasal spray per ENT recs.   EXAM: CHEST 2 VIEW  COMPARISON: CT chest 11/10/2014 and earlier. Chest x-ray 10/11/2014 and earlier.  FINDINGS: Cardiac silhouette upper normal in size, unchanged. Thoracic aorta mildly tortuous and atherosclerotic, unchanged. Hilar and mediastinal contours otherwise unremarkable. Interval development of airspace consolidation with air bronchograms in the left lower lobe. No evidence of residual or recurrent opacity in the lingula at the site of prior radiation pneumonitis. New vague opacity peripherally in the left upper lobe approaching the apex. Right lung remains clear. No pleural effusions. Mild degenerative changes involving the thoracic spine.  IMPRESSION: 1. Pneumonia involving the left lower lobe and minimally the left upper lobe. 2. No evidence of residual or  recurrent disease in the lingula at the site of prior radiation pneumonitis.   Electronically Signed  By: Evangeline Dakin M.D.  On: 02/15/2015 14:03   Pulmonary exam normal breath sounds clear to auscultation       Cardiovascular hypertension, (-) angina(-) CAD and (-) Past MI Normal cardiovascular exam Rhythm:Regular Rate:Normal     Neuro/Psych  Neuromuscular disease negative psych ROS   GI/Hepatic negative GI ROS, Neg liver ROS,   Endo/Other  diabetes, Type 2, Oral Hypoglycemic Agents  Renal/GU negative Renal ROS     Musculoskeletal  (+) Arthritis , Rheumatoid disorders,    Abdominal   Peds  Hematology  (+) Blood dyscrasia, anemia ,   Anesthesia Other Findings Day of surgery medications reviewed with the patient.  Breast cancer  Reproductive/Obstetrics                       Anesthesia Physical Anesthesia Plan  ASA: III  Anesthesia Plan: General   Post-op Pain Management:    Induction: Intravenous  Airway Management Planned: Oral ETT  Additional Equipment:   Intra-op Plan:   Post-operative Plan: Extubation in OR  Informed Consent: I have reviewed the patients History and Physical, chart, labs and discussed the procedure including the risks, benefits and alternatives for the proposed anesthesia with the patient or authorized representative who has indicated his/her understanding and acceptance.   Dental advisory given  Plan Discussed with: CRNA  Anesthesia Plan Comments: (Risks/benefits of general anesthesia discussed with patient including risk of damage to teeth, lips, gum, and tongue, nausea/vomiting, allergic reactions to medications, and the possibility of heart attack, stroke and death.  All patient questions answered.  Patient wishes to proceed.)        Anesthesia Quick Evaluation

## 2015-03-16 NOTE — Progress Notes (Signed)
Orthopedic Tech Progress Note Patient Details:  [name] 1941-03-08 [id number] Patient has c-collar. Patient ID: Ashley Bray, female   DOB: 04/05/1940, 74 y.o.   MRN: [id number]   Braulio Bosch 03/16/2015, 3:40 PM

## 2015-03-16 NOTE — Anesthesia Postprocedure Evaluation (Signed)
Anesthesia Post Note  Patient: Ashley Bray  Procedure(s) Performed: Procedure(s) (LRB): ANTERIOR  CERVICAL DECOMPRESSION FUSION CERVICAL TWO-THREE,CERVICAL THREE-FOUR WITH REMOVAL OF HARDWARE AT CERVICAL FOUR-FIVE. (N/A)  Patient location during evaluation: PACU Anesthesia Type: General Level of consciousness: awake and alert Pain management: pain level controlled Vital Signs Assessment: post-procedure vital signs reviewed and stable Respiratory status: spontaneous breathing, nonlabored ventilation, respiratory function stable and patient connected to nasal cannula oxygen Cardiovascular status: blood pressure returned to baseline and stable Postop Assessment: no signs of nausea or vomiting Anesthetic complications: no    Last Vitals:  Filed Vitals:   03/16/15 1445 03/16/15 1517  BP:  137/66  Pulse: 84 91  Temp:  36.7 C  Resp: 19 16    Last Pain:  Filed Vitals:   03/16/15 1550  PainSc: [name] Edward Tegan [place]

## 2015-03-16 NOTE — Anesthesia Procedure Notes (Addendum)
Procedure Name: Intubation Date/Time: 03/16/2015 10:17 AM Performed by: Jacquiline Doe A Pre-anesthesia Checklist: Patient identified, Timeout performed, Emergency Drugs available, Suction available and Patient being monitored Patient Re-evaluated:Patient Re-evaluated prior to inductionOxygen Delivery Method: Circle system utilized Preoxygenation: Pre-oxygenation with 100% oxygen Intubation Type: IV induction and Cricoid Pressure applied Ventilation: Mask ventilation without difficulty Laryngoscope Size: Mac and 3 Grade View: Grade II Tube type: Oral Tube size: 7.5 mm Number of attempts: 1 Airway Equipment and Method: Stylet Placement Confirmation: ETT inserted through vocal cords under direct vision,  positive ETCO2 and breath sounds checked- equal and bilateral Secured at: 22 cm Tube secured with: Tape Dental Injury: Teeth and Oropharynx as per pre-operative assessment

## 2015-03-17 ENCOUNTER — Encounter (HOSPITAL_COMMUNITY): Payer: Self-pay | Admitting: Neurosurgery

## 2015-03-17 LAB — GLUCOSE, CAPILLARY: Glucose-Capillary: 108 mg/dL — ABNORMAL HIGH (ref 65–99)

## 2015-03-17 MED ORDER — CYCLOBENZAPRINE HCL 10 MG PO TABS: 10.0000 mg | ORAL_TABLET | Freq: Three times a day (TID) | ORAL | Status: DC | PRN

## 2015-03-17 NOTE — Discharge Summary (Signed)
Physician Discharge Summary  Patient ID: Ashley Bray MRN: [id number] DOB/AGE: June 11, 1940 74 y.o.  Admit date: 03/16/2015 Discharge date: 03/17/2015  Admission Diagnoses:  Discharge Diagnoses:  Active Problems:   Cervical stenosis of spinal canal   Discharged Condition: good  Hospital Course: The patient was noted hospital where she underwent uncomplicated two-level anterior cervical decompression infusion. Postoperative she is doing quite well. Neck pain is minimal. No upper extremity symptoms. Ambulating without difficulty. Ready for discharge home.  Consults:   Significant Diagnostic Studies:   Treatments:   Discharge Exam: Blood pressure 126/58, pulse 80, temperature 98.1 F (36.7 C), temperature source Oral, resp. rate 18, weight 54.432 kg (120 lb), SpO2 98 %. Awake and alert. Oriented and appropriate. Cranial nerve function intact. Motor and sensory function extremities normal. Wound clean and dry. Chest and abdomen benign.  Disposition:      Medication List    TAKE these medications        acetaminophen 500 MG tablet  Commonly known as:  TYLENOL  Take 500 mg by mouth every 8 (eight) hours as needed for mild pain or moderate pain.     benzonatate 100 MG capsule  Commonly known as:  TESSALON  Take 1 capsule by mouth 3 (three) times daily as needed for cough.     cetirizine 10 MG tablet  Commonly known as:  ZYRTEC ALLERGY  Take 1 tablet (10 mg total) by mouth daily.     cyclobenzaprine 10 MG tablet  Commonly known as:  FLEXERIL  Take 1 tablet (10 mg total) by mouth 3 (three) times daily as needed for muscle spasms.     folic acid 1 MG tablet  Commonly known as:  FOLVITE  Take 1 mg by mouth daily.     gabapentin 100 MG capsule  Commonly known as:  NEURONTIN  Take 100 mg by mouth 3 (three) times daily.     GLUCAGON EMERGENCY 1 MG injection  Generic drug:  glucagon  Inject 1 mg into the muscle once as needed.     hydroxychloroquine 200 MG tablet   Commonly known as:  PLAQUENIL  Take 200 mg by mouth daily.     insulin aspart 100 UNIT/ML FlexPen  Commonly known as:  NOVOLOG  Inject 2 Units into the skin as needed for high blood sugar (If sugar get 182 and above. Starts at 2 units. Pt knows how to calculate dosage). Per Sliding scale     ipratropium 0.03 % nasal spray  Commonly known as:  ATROVENT  Place 2 sprays into the nose 4 (four) times daily.     meloxicam 7.5 MG tablet  Commonly known as:  MOBIC  Take 7.5 mg by mouth daily as needed for pain.     metFORMIN 500 MG tablet  Commonly known as:  GLUCOPHAGE  Take 1,000 mg by mouth 2 (two) times daily with a meal.     montelukast 10 MG tablet  Commonly known as:  SINGULAIR  Take 1 tablet (10 mg total) by mouth at bedtime.     ONE TOUCH ULTRA 2 w/Device Kit     pravastatin 40 MG tablet  Commonly known as:  PRAVACHOL  Take 40 mg by mouth daily.     Umeclidinium-Vilanterol 62.5-25 MCG/INH Aepb  Commonly known as:  ANORO ELLIPTA  Inhale 1 puff into the lungs daily.     vitamin B-12 1000 MCG tablet  Commonly known as:  CYANOCOBALAMIN  Take 1,000 mcg by mouth 2 (two) times daily.  Follow-up Information    Follow up with Charlie Pitter, MD.   Specialty:  Neurosurgery   Contact information:   [address] [place] [address] [phone]       Signed: Charlie Pitter 03/17/2015, 10:50 AM

## 2015-03-17 NOTE — Progress Notes (Signed)
Patient alert and oriented, mae's well, voiding adequate amount of urine, swallowing without difficulty, no c/o pain. Patient discharged home with family. Script and discharged instructions given to patient. Patient and family stated understanding of d/c instructions given and has an appointment with MD. 

## 2015-03-17 NOTE — Progress Notes (Signed)
Utilization review completed.  

## 2015-03-17 NOTE — Discharge Instructions (Signed)
Wound Care Keep incision area dry.  You may remove outer bandage after 2 days and shower.   If you shower prior cover incision with plastic wrap.  Do not put any creams, lotions, or ointments on incision. Leave steri-strips on neck.  They will fall off by themselves. Activity Walk each and every day, increasing distance each day. No lifting greater than 5 lbs.  Avoid excessive neck motion. No driving for 2 weeks; may ride as a passenger locally. Wear neck brace at all times except when showering. Diet Resume your normal diet.  Return to Work Will be discussed at you follow up appointment. Call Your Doctor If Any of These Occur Redness, drainage, or swelling at the wound.  Temperature greater than 101 degrees. Severe pain not relieved by pain medication. Increased difficulty swallowing.  Incision starts to come apart. Follow Up Appt Call today and ask for Ashley Bray for appointment in 1-2 weeks [phone]) or for problems.  If you have any hardware placed in your spine, you will need an x-ray before your appointment. Anterior Cervical Diskectomy and Fusion Anterior cervical diskectomy and fusion is a surgery that is done on the neck (cervical spine) to take pressure off of the nerves or the spinal cord. It is performed through the front (anterior) part of the neck. During this surgery, the damaged disk that is causing pain, numbness, or weakness is removed. The area where the disk was removed is filled with a plastic spacer implant, a bone graft, or both. These implants and bone grafts take pressure off of the nerves and spinal cord by making more room for the nerves to leave the spine. LET [facility] CARE PROVIDER KNOW ABOUT:  Any allergies you have.  All medicines you are taking, including vitamins, herbs, eye drops, creams, and over-the-counter medicines.  Previous problems you or members of your family have had with the use of anesthetics.  Any blood disorders you have.  Previous  surgeries you have had.  Any medical conditions you may have. RISKS AND COMPLICATIONS Generally, this is a safe procedure. However, problems may occur, including:  Infection.  Bleeding with the possible need for blood transfusion.  Injury to surrounding structures, including nerves.  Leakage of fluid from the brain or spinal cord (cerebrospinal fluid).  Blood clots.  Temporary breathing difficulties after surgery. BEFORE THE PROCEDURE  Follow your health care provider's instructions about eating or drinking restrictions.  Ask your health care provider about:  Changing or stopping your regular medicines. This is especially important if you are taking diabetes medicines or blood thinners.  Taking medicines such as aspirin and ibuprofen. These medicines can thin your blood. Do not take these medicines before your procedure if your health care provider instructs you not to.  You may be given antibiotic medicines to help prevent infection.  Your incision site may be marked on your neck. PROCEDURE  An IV tube will be inserted into one of your veins.  You will be given one or more of the following:  A medicine that helps you relax (sedative).  A medicine that makes you fall asleep (general anesthetic).  A breathing tube will be placed.  Your neck will be cleaned with a germ-killing solution (antiseptic).  Your surgeon will make an incision on the front of your neck, usually within a skinfold line.  Your neck muscles will be spread apart, and the damaged disk and bone spurs will be removed.  The area where the disk was removed will be filled  with a small plastic spacer implant, a bone graft, or both.  Your surgeon may put metal plates and screws (hardware) in your neck. This helps to stabilize the surgical site and keep implants and bone grafts in place. The hardware reduces motion at the surgical site so your bones can grow together (fuse). This provides extra support to  your neck.  The incision will be closed with stitches (sutures).  A bandage (dressing) will be applied to cover the incision. The procedure may vary among health care providers and hospitals. AFTER THE PROCEDURE  Your blood pressure, heart rate, breathing rate, and blood oxygen level will be monitored often until the medicines you were given have worn off.  You will be monitored for any signs of complications from the procedure, such as:  Too much bleeding from the incision site.  A buildup of blood under your skin at the surgical site.  Difficulty breathing.  You may continue to receive antibiotics.  You can start to eat as soon as you feel comfortable.  You may be given a neck brace to wear after surgery. This brace limits your neck movement while your bones are fusing together. Follow your health care provider's instructions about how often and how long you need to wear this.   This information is not intended to replace advice given to you by your health care provider. Make sure you discuss any questions you have with your health care provider.   Document Released: 02/21/2009 Document Revised: 03/26/2014 Document Reviewed: 02/21/2009 Elsevier Interactive Patient Education [employer].

## 2015-03-30 ENCOUNTER — Telehealth: Payer: Self-pay | Admitting: *Deleted

## 2015-03-30 NOTE — Telephone Encounter (Signed)
Called patient and left message that she should keep her appointment on April 5th.  MD does not need to see her sooner.

## 2015-03-30 NOTE — Telephone Encounter (Signed)
Per MD pt needs to keep appt on 4/5

## 2015-03-30 NOTE — Telephone Encounter (Signed)
Patient states she has had neck surgery and was told by Dr. Oliva Bustard that he wanted to f/u with her post surgery.

## 2015-04-12 ENCOUNTER — Ambulatory Visit: Payer: Medicare Other

## 2015-04-22 IMAGING — MG MM CAD SCREENING MAMMO
1 series · 4 of 4 positions shown · non-contrast
Comparison: none

REASON FOR EXAM: SCR MAMMO NO ORDER
COMMENTS:

[R CC · right · 4 of 4 slices shown]
[im 1/4]
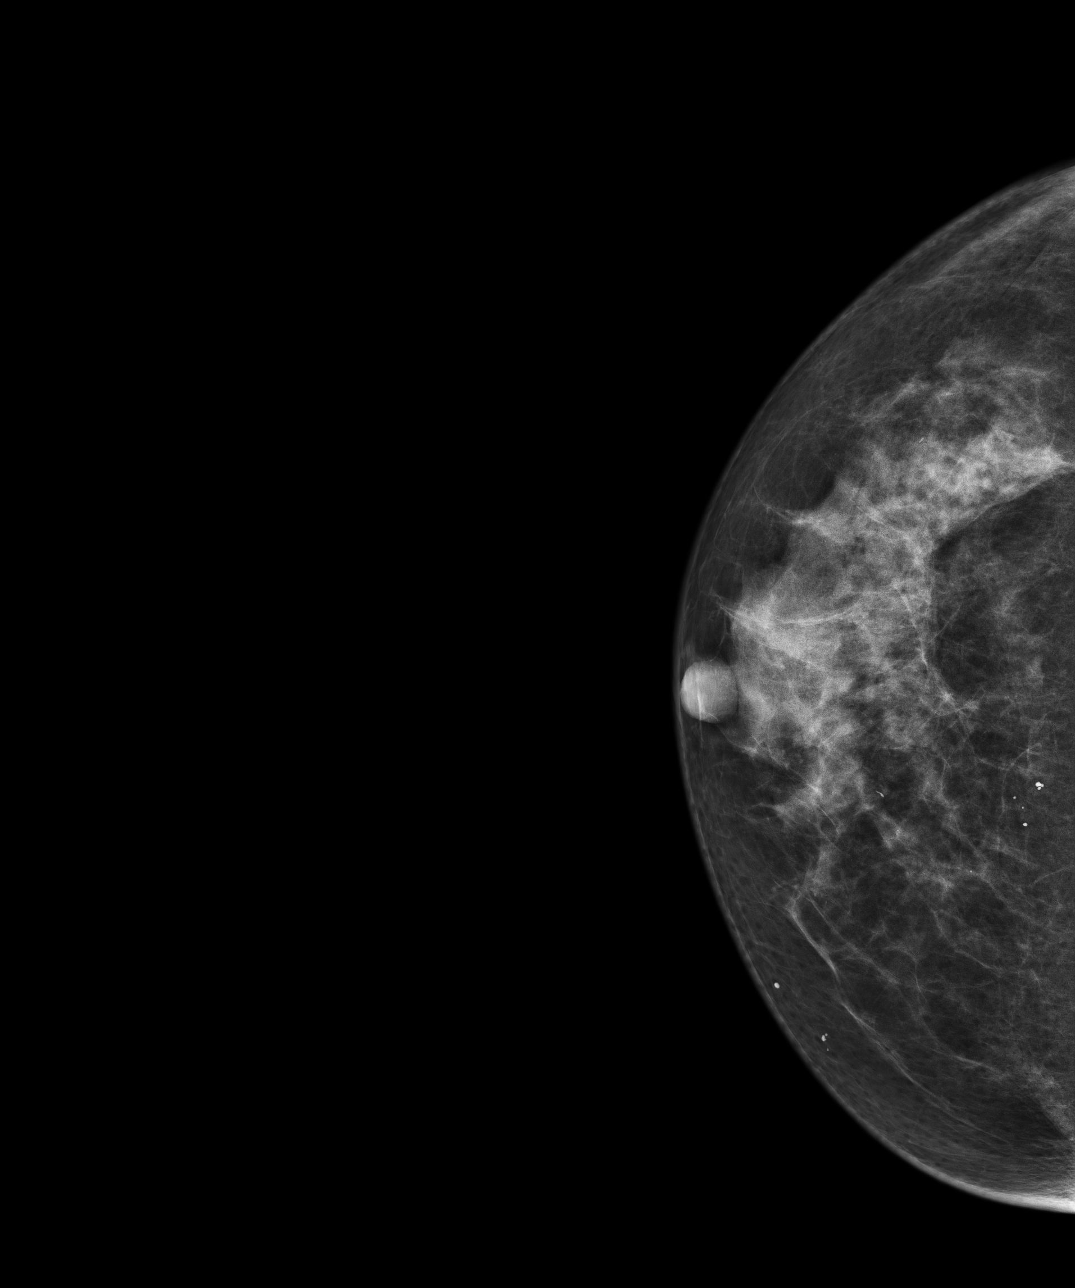
[im 2/4]
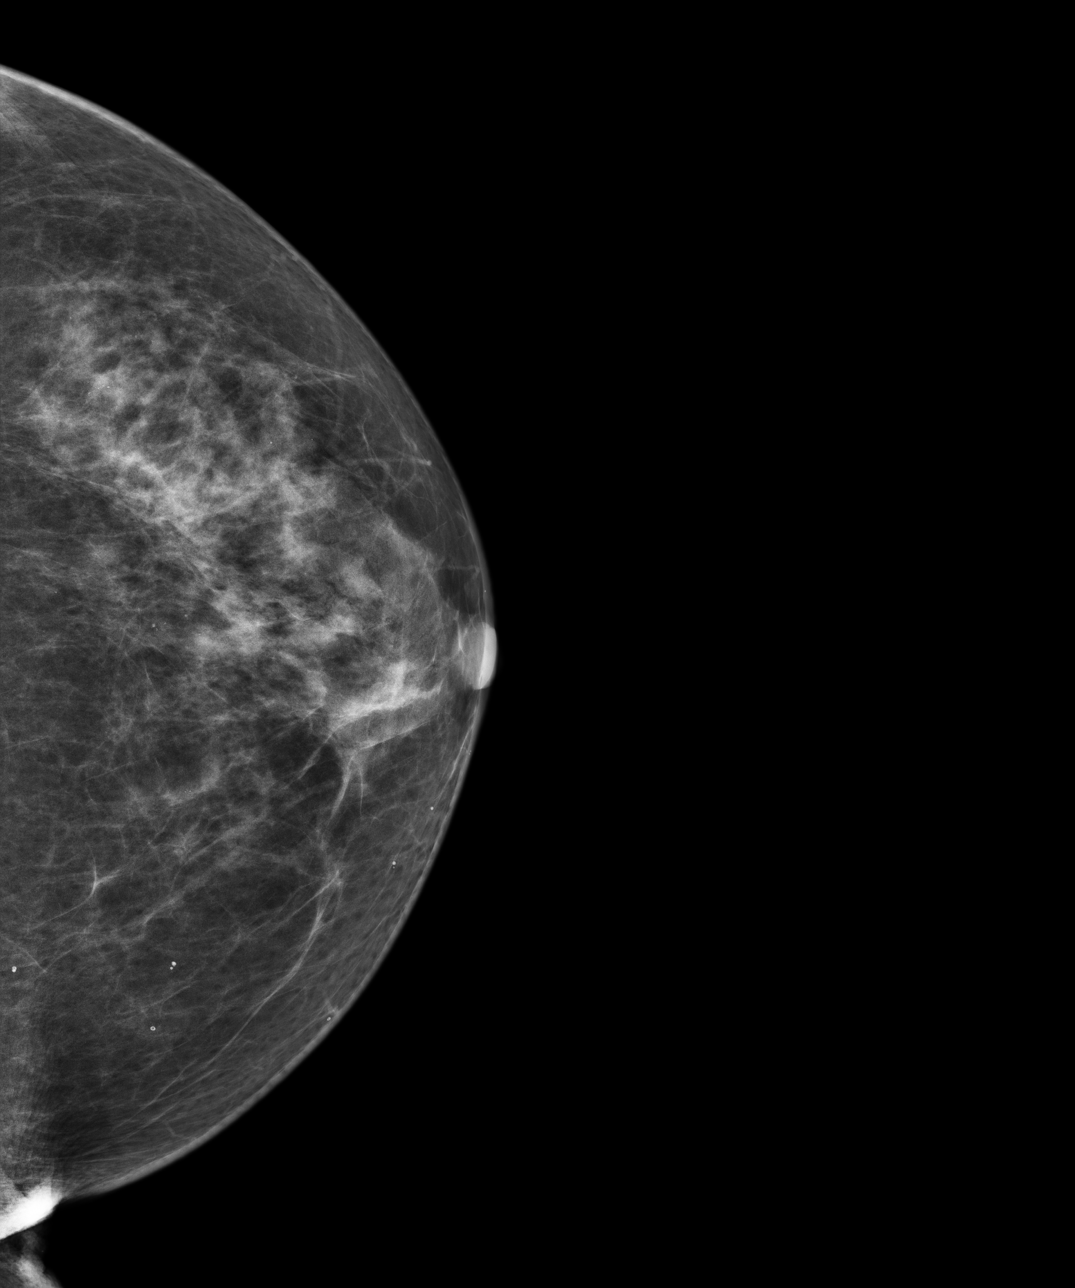
[im 3/4]
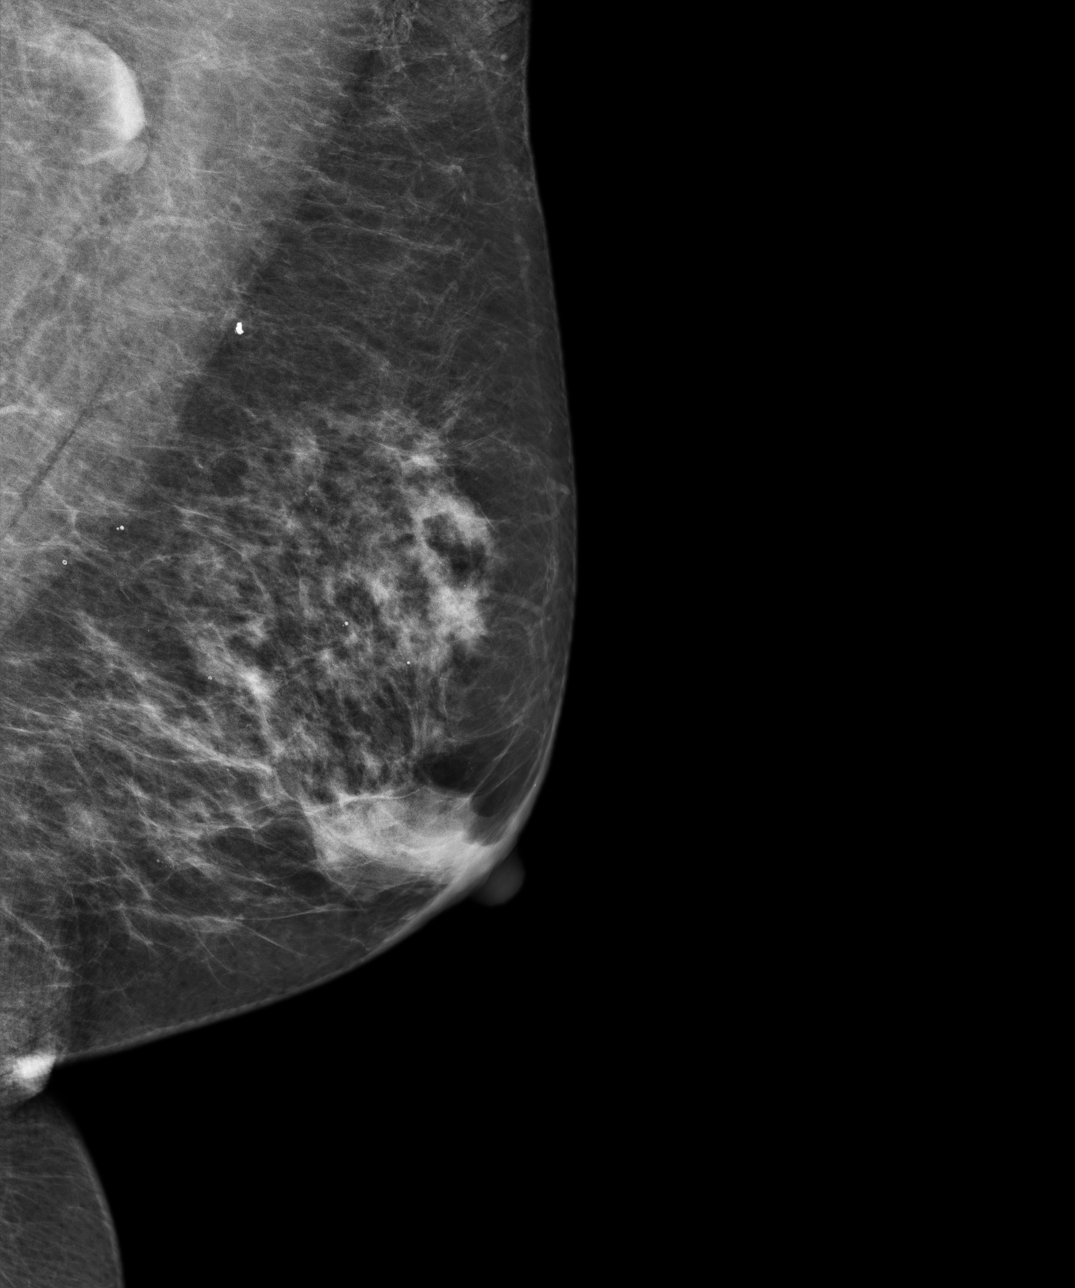
[im 4/4]
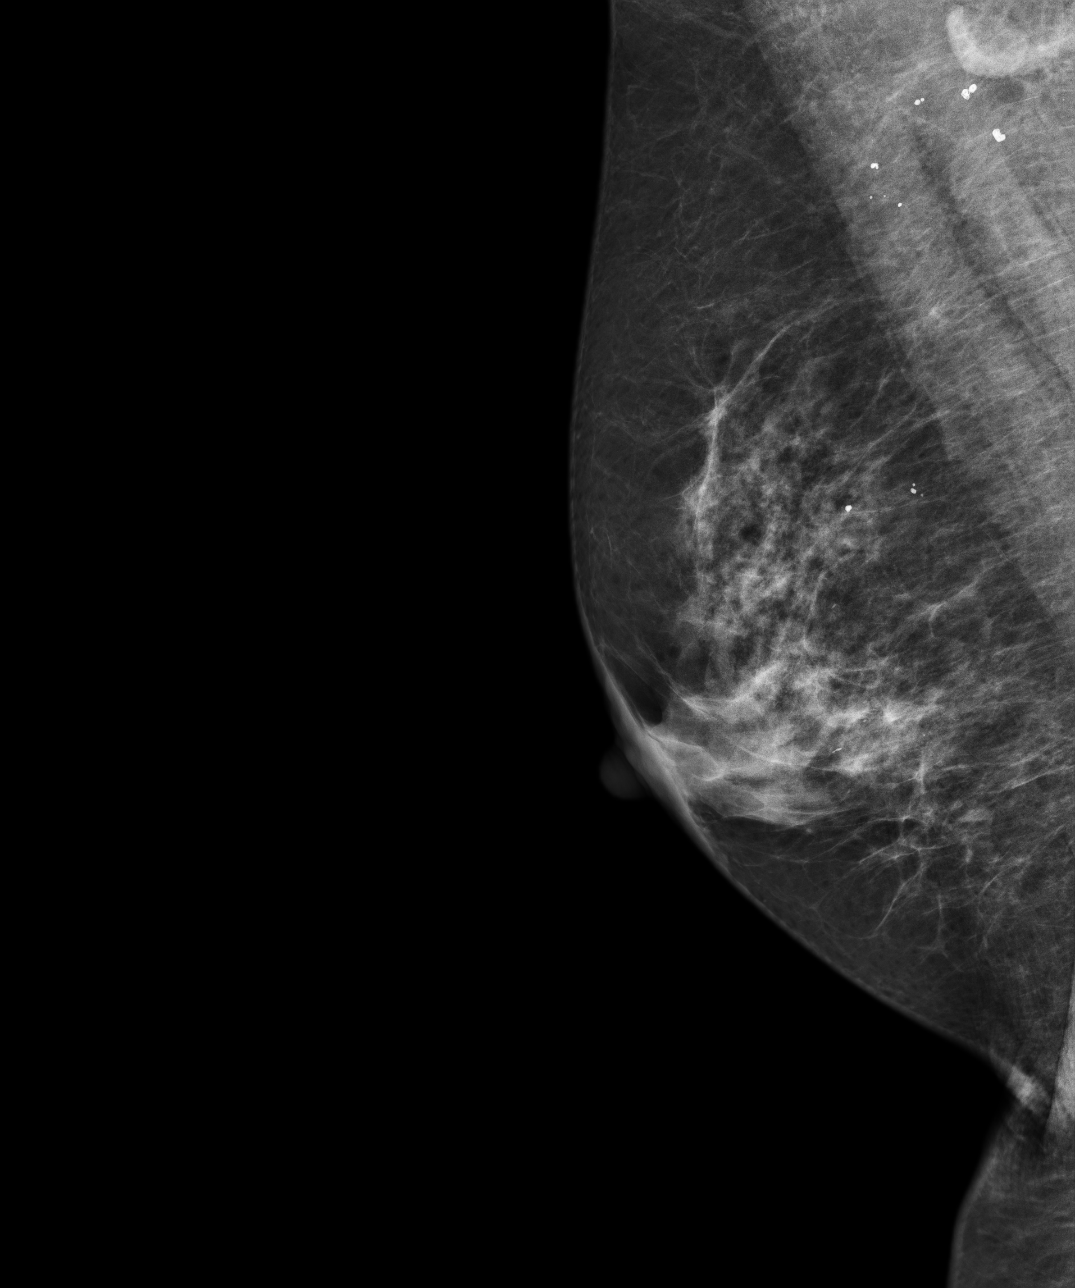

[4 of 4 positions shown; findings below may reference images not displayed]

PROCEDURE:     MAM - MAM DGTL SCRN MAM NO ORDER W/CAD  - August 05, 2012 [DATE]

RESULT:     Comparison is made to previous digital studies June 18, 2011,April 27, 2010, and March 17, 2008.

The breasts exhibit a scattered fibroglandular pattern. There are
benign-appearing lymph nodes in the axillary regions. There are coarse and
fine microcalcifications present bilaterally. There are no malignant
appearing groupings of microcalcification and there are no areas of new
architectural distortion.
IMPRESSION: There are no findings suspicious for developing malignancy.

BI-RADS 2: benign findings.

Recommendation: Please continue to encourage yearly mammographic followup.

BREAST COMPOSITION: The breast composition is SCATTERED FIBROGLANDULAR
TISSUE (glandular tissue is 25-50%)

A NEGATIVE MAMMOGRAM REPORT DOES NOT PRECLUDE BIOPSY OR OTHER EVALUATION OF
A CLINICALLY PALPABLE OR OTHERWISE SUSPICIOUS MASS OR LESION. BREAST CANCER
MAY NOT BE DETECTED BY MAMMOGRAPHY IN UP TO 10% OF CASES.

Dictation site:1

## 2015-05-12 ENCOUNTER — Ambulatory Visit
Admission: RE | Admit: 2015-05-12 | Discharge: 2015-05-12 | Disposition: A | Discharge status: Home / Self Care | Payer: Medicare Other | Source: Ambulatory Visit | Attending: Internal Medicine | Admitting: Internal Medicine

## 2015-05-12 DIAGNOSIS — J700 Acute pulmonary manifestations due to radiation: Secondary | ICD-10-CM | POA: Insufficient documentation

## 2015-05-12 DIAGNOSIS — R918 Other nonspecific abnormal finding of lung field: Secondary | ICD-10-CM | POA: Insufficient documentation

## 2015-05-12 DIAGNOSIS — J189 Pneumonia, unspecified organism: Secondary | ICD-10-CM

## 2015-05-16 ENCOUNTER — Telehealth: Payer: Self-pay | Admitting: Internal Medicine

## 2015-05-16 ENCOUNTER — Encounter: Payer: Self-pay | Admitting: Internal Medicine

## 2015-05-16 ENCOUNTER — Ambulatory Visit (INDEPENDENT_AMBULATORY_CARE_PROVIDER_SITE_OTHER): Payer: Medicare Other | Admitting: Internal Medicine

## 2015-05-16 VITALS — BP 126/78 | HR 82 | Ht 62.0 in | Wt 116.0 lb

## 2015-05-16 DIAGNOSIS — J189 Pneumonia, unspecified organism: Secondary | ICD-10-CM | POA: Insufficient documentation

## 2015-05-16 DIAGNOSIS — J181 Lobar pneumonia, unspecified organism: Secondary | ICD-10-CM | POA: Diagnosis not present

## 2015-05-16 MED ORDER — PREDNISONE 5 MG PO TABS: ORAL_TABLET | ORAL | Status: DC

## 2015-05-16 MED ORDER — LEVOFLOXACIN 500 MG PO TABS: 500.0000 mg | ORAL_TABLET | Freq: Every day | ORAL | Status: AC

## 2015-05-16 MED ORDER — AMBULATORY NON FORMULARY MEDICATION: Status: DC

## 2015-05-16 NOTE — Telephone Encounter (Signed)
Pt informed to continue the Anoro. Nothing further needed.

## 2015-05-16 NOTE — Progress Notes (Signed)
[facility] Consultation      MRN# [id number] Ashley Bray Oct 20, 1940   CC: Chief Complaint  Patient presents with  . Follow-up    pt. states breathing is baseline. occ. SOB due to surgery. prod. cough clear in color. denies wheezing or chest pain/tightness.    August 2015, found to have consolidation groundglass opacities repeat CT March 2016. Bronchoscopy with BAL showed no atypical organisms and all cultures were negative. 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 Has some post-radiation fibrosis.    Events since last clinic visit: Presents for follow up of radiation pneumonitis, weaned off prednisone since last visit. Dry cough today  Had Cspine surgery in Dec (Cspine fusion). Prior to surgery had to office visits for possible URI (viral), treated supportively with allergy meds and Anoro.  Had a recent CT scan, and would like to discuss the results today.  Also notices surgeries been having subjective low-grade fevers, and cough is described as "tickle in throat." Cough is mildly dry, and intermittent at times. Patient states she plans to participate in a 5K walk in [place] in the middle of March, and has been trying to stay active.   Medication:   Current Outpatient Rx  Name  Route  Sig  Dispense  Refill  . acetaminophen (TYLENOL) 500 MG tablet   Oral   Take 500 mg by mouth every 8 (eight) hours as needed for mild pain or moderate pain.         . benzonatate (TESSALON) 100 MG capsule   Oral   Take 1 capsule by mouth 3 (three) times daily as needed for cough.       0   . Blood Glucose Monitoring Suppl (ONE TOUCH ULTRA 2) W/DEVICE KIT            11     Dispense as written.   . cetirizine (ZYRTEC ALLERGY) 10 MG tablet   Oral   Take 1 tablet (10 mg total) by mouth daily.   30 tablet   12   . cyclobenzaprine (FLEXERIL) 10 MG tablet    Oral   Take 1 tablet (10 mg total) by mouth 3 (three) times daily as needed for muscle spasms.   30 tablet   0   . folic acid (FOLVITE) 1 MG tablet   Oral   Take 1 mg by mouth daily.      11   . gabapentin (NEURONTIN) 100 MG capsule   Oral   Take 100 mg by mouth 3 (three) times daily.         Marland Kitchen GLUCAGON EMERGENCY 1 MG injection   Intramuscular   Inject 1 mg into the muscle once as needed.       0     Dispense as written.   . hydroxychloroquine (PLAQUENIL) 200 MG tablet   Oral   Take 200 mg by mouth daily.         . insulin aspart (NOVOLOG) 100 UNIT/ML FlexPen   Subcutaneous   Inject 2 Units into the skin as needed for high blood sugar (If sugar get 182 and above. Starts at 2 units. Pt knows how to calculate dosage). Per Sliding scale         . ipratropium (ATROVENT) 0.03 % nasal spray   Nasal   Place 2 sprays into the nose 4 (four) times daily.   30 mL   1   . meloxicam (  MOBIC) 7.5 MG tablet   Oral   Take 7.5 mg by mouth daily as needed for pain.         . metFORMIN (GLUCOPHAGE) 500 MG tablet   Oral   Take 1,000 mg by mouth 2 (two) times daily with a meal.         . montelukast (SINGULAIR) 10 MG tablet   Oral   Take 1 tablet (10 mg total) by mouth at bedtime. Patient taking differently: Take 10 mg by mouth at bedtime as needed.    30 tablet   1   . pravastatin (PRAVACHOL) 40 MG tablet   Oral   Take 40 mg by mouth daily.         . vitamin B-12 (CYANOCOBALAMIN) 1000 MCG tablet   Oral   Take 1,000 mcg by mouth 2 (two) times daily.         . AMBULATORY NON FORMULARY MEDICATION      Medication Name: incentive spirometry  Use as directed   1 each   0   . levofloxacin (LEVAQUIN) 500 MG tablet   Oral   Take 1 tablet (500 mg total) by mouth daily.   14 tablet   0   . predniSONE (DELTASONE) 5 MG tablet      Take 4tabs (62m) for 1 wk, 3tabs (196m 1week. 2tabs (1061m1 week. 1 tab (5mg10m week.   70 tablet   0   .  Umeclidinium-Vilanterol (ANORO ELLIPTA) 62.5-25 MCG/INH AEPB   Inhalation   Inhale 1 puff into the lungs daily. Patient not taking: Reported on 05/16/2015   60 each   2      Review of Systems  Constitutional: Negative for fever, chills and weight loss.  HENT: Negative for congestion and ear discharge.        Mild nasal drainage.   Eyes: Negative.  Negative for blurred vision.  Respiratory: Positive for cough. Negative for sputum production and shortness of breath.        Mild cough - more clearing of the throat  Cardiovascular: Negative for chest pain and palpitations.  Gastrointestinal: Negative for heartburn, nausea and vomiting.  Genitourinary: Negative.   Skin: Negative for itching and rash.  Neurological: Positive for tingling. Negative for headaches.  Endo/Heme/Allergies: Negative.   Psychiatric/Behavioral: Negative.       Allergies:  Codeine  Physical Examination:  VS: BP 126/78 mmHg  Pulse 82  Ht 5' 2" (1.575 m)  Wt 116 lb (52.617 kg)  BMI 21.21 kg/m2  SpO2 99%  General Appearance: No distress  HEENT: PERRLA, no ptosis, no other lesions noticed Pulmonary:normal breath sounds., diaphragmatic excursion normal.No wheezing, No rales   Cardiovascular:  Normal S1,S2.  No m/r/g.     Abdomen:Exam: Benign, Soft, non-tender, No masses  Skin:   warm, no rashes, no ecchymosis  Extremities: normal, no cyanosis, clubbing, warm with normal capillary refill.      Rad results: (The following images and results were reviewed by Dr. MungStevenson Clinch2/27/2017). 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 Treated with radiation. Denies pain or SOB to chest. Denies other surgeries  EXAM: CT CHEST WITHOUT CONTRAST  TECHNIQUE: Multidetector CT imaging of the chest was performed following the standard protocol without IV contrast.  COMPARISON: Chest radiograph, 02/15/2015. Chest CT,  11/10/2014.  FINDINGS: Neck base and axilla: No masses. No adenopathy. Fluid collection lies along the anterior margin of the left shoulder, nonspecific.  This may be in subcoracoid bursa. The seroma in the left breast has decreased in size from 2 cm to 1.5 cm. No new breast abnormalities.  Mediastinum and hila: Heart is normal in size and configuration. Mild coronary artery calcifications. No mediastinal or hilar masses or pathologically enlarged lymph nodes.  Lungs and pleura: Since prior study, since prior study, since prior study, airspace consolidation has developed right lower lobe in a peribronchovascular distribution. A smaller similar area seen in the central right middle lobe. There is linear and subtle ground-glass opacity in the left upper lobe lingula, similar to the prior exam. Minimal linear and ground-glass opacities seen in central left lower lobe. There is no pulmonary mass or suspicious nodule. No evidence of pulmonary edema. No pleural effusion or pneumothorax.  Limited upper abdomen: Unremarkable.  Musculoskeletal: Skeletal structures are demineralized. There are degenerative changes throughout the visualized spine no osteoblastic or osteolytic lesions.  IMPRESSION: 1. New airspace consolidation has developed since the prior chest CT, primarily in the right lower lobe with a smaller area in the right middle lobe. This is consistent with radiation pneumonitis. It could be infectious pneumonia. Milder opacity in the left upper lobe lingula and central aspect of the left lower lobe is similar to the prior exam, with the stability suggesting chronic atelectasis or scarring. Remainder of the lungs is clear. 2. No evidence of metastatic disease.     Assessment and Plan: Pneumonitis CT scan reviewed with patient. There is a right-sided middle lobe and lower lobe consolidation versus pressure will pneumonitis. May have had some mild microaspiration after  surgery followed by a viral URI that could have caused findings seen on current CT. However, reviewing CT scan from August 2016 shows that there may have been a pneumonitis or mild consolidation developing in the right middle/right lower lobe. 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 Symptomatically, has had some mild persistent cough. I believe this current right middle lobe/right lower lobe consolidation is more of a pneumonitis questionable aspiration versus rule out viral versus combination of both. At this time I will treat as a pneumonitis/underlying pneumonia, and will also present images at thoracic oncology conference for further opinions/recommendations. I have discussed a possible repeat bronchoscopy with the patient, and she is in favor of this if it is needed after tumor boards or after repeat CT scan in 2 months.   Plan: -Prednisone 20 mg, taper by 5 mg per week -Levaquin 500 mg, 1 tab daily 14 days -Continue with over-the-counter allergy regiment, and nasal sinus rinse -Incentive spirometer 10-15 times per day -Repeat CT Chest scan with contrast prior to follow-up visit in 2 months  Lung consolidation Consolidation of right middle lobe/right lower lobe -Differentials include viral pneumonitis, aspiration pneumonitis, pneumonia, malignancy  Plan: -Treated as pneumonitis/pneumonia with steroids and antibiotics -Incentive spirometer -Repeat CT scan of chest with contrast in 2 months -See plan for pneumonitis -Present case and images that tumor conference    Updated Medication List Outpatient Encounter Prescriptions as of 05/16/2015  Medication Sig  . acetaminophen (TYLENOL) 500 MG tablet Take 500 mg by mouth every 8 (eight) hours as needed for mild pain or moderate pain.  . benzonatate (TESSALON) 100 MG capsule Take 1 capsule by mouth 3 (three) times daily as needed  for cough.   . Blood Glucose Monitoring Suppl (ONE TOUCH ULTRA 2) W/DEVICE KIT   . cetirizine (ZYRTEC ALLERGY) 10 MG tablet Take  1 tablet (10 mg total) by mouth daily.  . cyclobenzaprine (FLEXERIL) 10 MG tablet Take 1 tablet (10 mg total) by mouth 3 (three) times daily as needed for muscle spasms.  . folic acid (FOLVITE) 1 MG tablet Take 1 mg by mouth daily.  Marland Kitchen gabapentin (NEURONTIN) 100 MG capsule Take 100 mg by mouth 3 (three) times daily.  Marland Kitchen GLUCAGON EMERGENCY 1 MG injection Inject 1 mg into the muscle once as needed.   . hydroxychloroquine (PLAQUENIL) 200 MG tablet Take 200 mg by mouth daily.  . insulin aspart (NOVOLOG) 100 UNIT/ML FlexPen Inject 2 Units into the skin as needed for high blood sugar (If sugar get 182 and above. Starts at 2 units. Pt knows how to calculate dosage). Per Sliding scale  . ipratropium (ATROVENT) 0.03 % nasal spray Place 2 sprays into the nose 4 (four) times daily.  . meloxicam (MOBIC) 7.5 MG tablet Take 7.5 mg by mouth daily as needed for pain.  . metFORMIN (GLUCOPHAGE) 500 MG tablet Take 1,000 mg by mouth 2 (two) times daily with a meal.  . montelukast (SINGULAIR) 10 MG tablet Take 1 tablet (10 mg total) by mouth at bedtime. (Patient taking differently: Take 10 mg by mouth at bedtime as needed. )  . pravastatin (PRAVACHOL) 40 MG tablet Take 40 mg by mouth daily.  . vitamin B-12 (CYANOCOBALAMIN) 1000 MCG tablet Take 1,000 mcg by mouth 2 (two) times daily.  . AMBULATORY NON FORMULARY MEDICATION Medication Name: incentive spirometry  Use as directed  . levofloxacin (LEVAQUIN) 500 MG tablet Take 1 tablet (500 mg total) by mouth daily.  . predniSONE (DELTASONE) 5 MG tablet Take 4tabs (89m) for 1 wk, 3tabs (133m 1week. 2tabs (1054m1 week. 1 tab (5mg9m week.  . UmMarland Kitchenclidinium-Vilanterol (ANORO ELLIPTA) 62.5-25 MCG/INH AEPB Inhale 1 puff into the lungs daily. (Patient not taking: Reported on 05/16/2015)   No facility-administered encounter medications on file as of  05/16/2015.    Orders for this visit: Orders Placed This Encounter  Procedures  . CT Chest W Contrast    Standing Status: Future     Number of Occurrences:      Standing Expiration Date: 07/13/2016    Order Specific Question:  If indicated for the ordered procedure, I authorize the administration of contrast media per Radiology protocol    Answer:  Yes    Order Specific Question:  Reason for Exam (SYMPTOM  OR DIAGNOSIS REQUIRED)    Answer:  right lung pneumonitis    Order Specific Question:  Preferred imaging location?    Answer:  [facility]    Thank  you for the visitation and for allowing  [facility] to assist in the care of your patient. Our recommendations are noted above.  Please contact us [place] can be of further service.  VishVilinda Boehringer  Pulmonary and Critical Care Office Number: [phone]

## 2015-05-16 NOTE — Telephone Encounter (Signed)
Called patient to schedule CT Chest and F/U appointment in 2 wks.  Pt wanted to know if she was still suppose to continue using the Anoro Inhaler?  Please advise. Rhonda J Cobb

## 2015-05-16 NOTE — Telephone Encounter (Signed)
Cont with Anoro.

## 2015-05-16 NOTE — Telephone Encounter (Signed)
Don't see anything in your notes about stopping the Anoro. Please advise that you want pt to continue. Thanks

## 2015-05-16 NOTE — Assessment & Plan Note (Addendum)
CT scan reviewed with patient. There is a right-sided middle lobe and lower lobe consolidation versus pressure will pneumonitis. May have had some mild microaspiration after surgery followed by a viral URI that could have caused findings seen on current CT. However, reviewing CT scan from August 2016 shows that there may have been a pneumonitis or mild consolidation developing in the right middle/right lower lobe. 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 Symptomatically, has had some mild persistent cough. I believe this current right middle lobe/right lower lobe consolidation is more of a pneumonitis questionable aspiration versus rule out viral versus combination of both. At this time I will treat as a pneumonitis/underlying pneumonia, and will also present images at thoracic oncology conference for further opinions/recommendations. I have discussed a possible repeat bronchoscopy with the patient, and she is in favor of this if it is needed after tumor boards or after repeat CT scan in 2 months.   Plan: -Prednisone 20 mg, taper by 5 mg per week -Levaquin 500 mg, 1 tab daily 14 days -Continue with over-the-counter allergy regiment, and nasal sinus rinse -Incentive spirometer 10-15 times per day -Repeat CT Chest scan with contrast prior to follow-up visit in 2 months

## 2015-05-16 NOTE — Assessment & Plan Note (Signed)
Consolidation of right middle lobe/right lower lobe -Differentials include viral pneumonitis, aspiration pneumonitis, pneumonia, malignancy  Plan: -Treated as pneumonitis/pneumonia with steroids and antibiotics -Incentive spirometer -Repeat CT scan of chest with contrast in 2 months -See plan for pneumonitis -Present case and images that tumor conference

## 2015-05-16 NOTE — Patient Instructions (Addendum)
Follow up with Dr. Stevenson Clinch in: 2 months - repeat CT chest with contrast prior to follow up for Right lung pneumonitis - Prednisone taper.  20mg  for 1 week, then 15mg  for 1 week, then 10mg  for 1 week, then 5mg  for one week, then stop.  Take with breakfast - continue with regular allergy meds - levaquin 500mg  x 14 days. 1 tab daily. - use your incentive spirometry at least 10 times per day.

## 2015-06-14 ENCOUNTER — Telehealth: Payer: Self-pay | Admitting: *Deleted

## 2015-06-14 ENCOUNTER — Other Ambulatory Visit: Payer: Self-pay | Admitting: Family Medicine

## 2015-06-14 NOTE — Telephone Encounter (Signed)
Reports that she had been taken off her Tamoxifen due to neck pain and she has had surgery and is almost back to nml, asking should she restart her Tamoxifen now or wait until her appt in mid April

## 2015-06-14 NOTE — Telephone Encounter (Signed)
Per L Herring, AGNP-C, she can restart Tamoxifen and if she has any problems with it, she is to stop it. Pt advised of this and repeated it back to me

## 2015-06-22 ENCOUNTER — Ambulatory Visit: Payer: Medicare Other | Admitting: Oncology

## 2015-06-22 ENCOUNTER — Other Ambulatory Visit: Payer: Medicare Other

## 2015-06-27 DIAGNOSIS — E538 Deficiency of other specified B group vitamins: Secondary | ICD-10-CM | POA: Insufficient documentation

## 2015-07-12 ENCOUNTER — Ambulatory Visit
Admission: RE | Admit: 2015-07-12 | Discharge: 2015-07-12 | Disposition: A | Discharge status: Home / Self Care | Payer: Medicare Other | Source: Ambulatory Visit | Attending: Internal Medicine | Admitting: Internal Medicine

## 2015-07-12 ENCOUNTER — Telehealth: Payer: Self-pay | Admitting: *Deleted

## 2015-07-12 DIAGNOSIS — J479 Bronchiectasis, uncomplicated: Secondary | ICD-10-CM | POA: Diagnosis not present

## 2015-07-12 DIAGNOSIS — J189 Pneumonia, unspecified organism: Secondary | ICD-10-CM

## 2015-07-12 MED ORDER — IOPAMIDOL (ISOVUE-300) INJECTION 61%
75.0000 mL | Freq: Once | INTRAVENOUS | Status: AC | PRN
  Administered 2015-07-12: 75 mL via INTRAVENOUS

## 2015-07-12 NOTE — Telephone Encounter (Signed)
Pt informed of results. Nothing further needed. 

## 2015-07-12 NOTE — Telephone Encounter (Signed)
-----   Message from Vilinda Boehringer, MD sent at 07/12/2015 11:06 AM EDT ----- Regarding: CT Chest results Please inform patient of her CT Scan results: Right middle and lower lobe opacities noted on prior exam have nearly resolved. This is most consistent with improving pneumonia. Residual reticular densities remain in the right lower lobe most consistent with scarring.  Further details to be discussed at follow up visit.   Thanks VM

## 2015-07-13 ENCOUNTER — Encounter: Payer: Self-pay | Admitting: Internal Medicine

## 2015-07-13 ENCOUNTER — Ambulatory Visit (INDEPENDENT_AMBULATORY_CARE_PROVIDER_SITE_OTHER): Payer: Medicare Other | Admitting: Internal Medicine

## 2015-07-13 ENCOUNTER — Telehealth: Payer: Self-pay | Admitting: Internal Medicine

## 2015-07-13 VITALS — BP 124/72 | HR 65 | Ht 62.0 in | Wt 117.8 lb

## 2015-07-13 DIAGNOSIS — J700 Acute pulmonary manifestations due to radiation: Secondary | ICD-10-CM

## 2015-07-13 DIAGNOSIS — R059 Cough, unspecified: Secondary | ICD-10-CM

## 2015-07-13 DIAGNOSIS — R05 Cough: Secondary | ICD-10-CM | POA: Diagnosis not present

## 2015-07-13 DIAGNOSIS — J189 Pneumonia, unspecified organism: Secondary | ICD-10-CM | POA: Diagnosis not present

## 2015-07-13 MED ORDER — FLUTTER DEVI: 1.0000 | Freq: Once | Status: DC

## 2015-07-13 NOTE — Assessment & Plan Note (Signed)
Consolidation of right middle lobe/right lower lobe now clearing with good clinical improvement.   April 2017 Ct Chest - RLL consolidation with near complete resolution, mild bronchiectasis, mild radiation changes in the left lung. No new nodules or infiltrates.   Plan: -cont with supportive care -Incentive spirometer and acapella valve -2 view CXR prior to follow up visit.  -Anoro inhaler daily- 1 puff, daily -See plan for pneumonitis

## 2015-07-13 NOTE — Patient Instructions (Signed)
Follow up with Dr. Stevenson Clinch in: 51months - 2 view CXR prior to follow up for PNA - cont with Anoro 1 puff daily - gargle and rinse after each use.  - continue with incentive spirometry - acapella valve 10 times per day, daily - cont with allergy regiment.

## 2015-07-13 NOTE — Progress Notes (Signed)
[facility] Consultation      MRN# [id number] Ashley Bray 25-Jan-1941   CC: Chief Complaint  Patient presents with  . Follow-up    CT results. c/o prod cough yellowish in color X2d   August 2015, found to have consolidation groundglass opacities repeat CT March 2016. Bronchoscopy with BAL showed no atypical organisms and all cultures were negative. 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 Has some post-radiation fibrosis.    Events since last clinic visit: Presents for follow up of radiation pneumonitis, weaned off prednisone since last visit. Productive cough today - yellowish, attributes to high pollen. Currently only using Anoro as needed.  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 Prior to surgery had to office visits for possible URI (viral), treated supportively with allergy meds and Anoro.  Had a recent CT scan, and would like to discuss the results today.  Staying active, recently completed a 5K walk.  Medication:   Current Outpatient Rx  Name  Route  Sig  Dispense  Refill  . acetaminophen (TYLENOL) 500 MG tablet   Oral   Take 500 mg by mouth every 8 (eight) hours as needed for mild pain or moderate pain.         Marland Kitchen AMBULATORY NON FORMULARY MEDICATION      Medication Name: incentive spirometry  Use as directed   1 each   0   . benzonatate (TESSALON) 100 MG capsule   Oral   Take 1 capsule by mouth 3 (three) times daily as needed for cough.       0   . Blood Glucose Monitoring Suppl (ONE TOUCH ULTRA 2) W/DEVICE KIT            11     Dispense as written.   . cetirizine (ZYRTEC ALLERGY) 10 MG tablet   Oral   Take 1 tablet (10 mg total) by mouth daily.   30 tablet   12   . cyclobenzaprine (FLEXERIL) 10 MG tablet   Oral   Take 1 tablet (10 mg total) by mouth 3 (three) times daily as needed for muscle spasms.   30 tablet   0   . folic  acid (FOLVITE) 1 MG tablet   Oral   Take 1 mg by mouth daily.      11   . gabapentin (NEURONTIN) 100 MG capsule   Oral   Take 100 mg by mouth 3 (three) times daily.         Marland Kitchen GLUCAGON EMERGENCY 1 MG injection   Intramuscular   Inject 1 mg into the muscle once as needed.       0     Dispense as written.   . hydroxychloroquine (PLAQUENIL) 200 MG tablet   Oral   Take 200 mg by mouth daily.         . insulin aspart (NOVOLOG) 100 UNIT/ML FlexPen   Subcutaneous   Inject 2 Units into the skin as needed for high blood sugar (If sugar get 182 and above. Starts at 2 units. Pt knows how to calculate dosage). Per Sliding scale         . ipratropium (ATROVENT) 0.03 % nasal spray   Nasal   Place 2 sprays into the nose 4 (four) times daily.   30 mL   1   . meloxicam (MOBIC) 7.5 MG tablet   Oral   Take 7.5 mg by mouth daily as  needed for pain.         . metFORMIN (GLUCOPHAGE) 500 MG tablet   Oral   Take 1,000 mg by mouth 2 (two) times daily with a meal.         . montelukast (SINGULAIR) 10 MG tablet   Oral   Take 1 tablet (10 mg total) by mouth at bedtime. Patient taking differently: Take 10 mg by mouth at bedtime as needed.    30 tablet   1   . pravastatin (PRAVACHOL) 40 MG tablet   Oral   Take 40 mg by mouth daily.         Marland Kitchen Umeclidinium-Vilanterol (ANORO ELLIPTA) 62.5-25 MCG/INH AEPB   Inhalation   Inhale 1 puff into the lungs daily.   60 each   2   . vitamin B-12 (CYANOCOBALAMIN) 1000 MCG tablet   Oral   Take 1,000 mcg by mouth 2 (two) times daily.         Marland Kitchen Respiratory Therapy Supplies (FLUTTER) DEVI   Does not apply   1 each by Does not apply route once.   1 each   0     Use as directed      Review of Systems  Constitutional: Negative for fever, chills and weight loss.  HENT: Negative for congestion and ear discharge.        Mild nasal drainage.   Eyes: Negative.  Negative for blurred vision.  Respiratory: Positive for cough. Negative  for sputum production and shortness of breath.        Mild cough - more clearing of the throat  Cardiovascular: Negative for chest pain and palpitations.  Gastrointestinal: Negative for heartburn, nausea and vomiting.  Genitourinary: Negative.   Skin: Negative for itching and rash.  Neurological: Positive for tingling. Negative for headaches.  Endo/Heme/Allergies: Negative.   Psychiatric/Behavioral: Negative.       Allergies:  Codeine  Physical Examination:  VS: BP 124/72 mmHg  Pulse 65  Ht '5\' 2"'  (1.575 m)  Wt 117 lb 12.8 oz (53.434 kg)  BMI 21.54 kg/m2  SpO2 98%  General Appearance: No distress  HEENT: PERRLA, no ptosis, no other lesions noticed Pulmonary:normal breath sounds., diaphragmatic excursion normal.No wheezing, No rales   Cardiovascular:  Normal S1,S2.  No m/r/g.     Abdomen:Exam: Benign, Soft, non-tender, No masses  Skin:   warm, no rashes, no ecchymosis  Extremities: normal, no cyanosis, clubbing, warm with normal capillary refill.      Rad results: (The following images and results were reviewed by Dr. Stevenson Clinch on 07/13/2015). CT Chest 07/11/15 Impressions Right middle and lower lobe opacities noted on prior exam have nearly resolved. This is most consistent with improving pneumonia. Residual reticular densities remain in the right lower lobe most consistent with scarring. Mild bronchiectasis is also noted in the right lower lobe.    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   April 2017 Ct Chest - RLL consolidation with near complete resolution, mild bronchiectasis, mild radiation changes in the left lung. No new nodules or infiltrates.   Plan: -cont with supportive B[facility] -Incentive spirometer and acapella valve -2 view CXR prior to follow up visit.  -Anoro inhaler daily- 1 puff, daily -See plan for pneumonitis    Pneumonitis CT scan reviewed with  patient. The is a right-sided middle lobe and lower lobe consolidation has almost completely resolved, fitting with inflammatory/infectious process.  May have had some mild microaspiration  after surgery followed by a viral URI that could have caused findings seen on current CT. 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 Symptomatically, has had some mild persistent cough. Mild cough today, mostly allergy related.   Plan: -Continue with over-the-counter allergy regiment, and nasal sinus rinse -Incentive spirometer 10-15 times per day and acapella valve -2 view cxr prior to follow up visit.    Cough This episode is mostly related to sinus drainage.  Plan - cont with current allergy regiment - cont with nasal saline rinse - acapella valve - ICS daily (10-15 times per day) - if no improvement and continues to be productive will consider short course of steroids and abx.      Updated Medication List Outpatient Encounter Prescriptions as of 07/13/2015  Medication Sig  . acetaminophen (TYLENOL) 500 MG tablet Take 500 mg by mouth every 8 (eight) hours as needed for mild pain or moderate pain.  Marland Kitchen AMBULATORY NON FORMULARY MEDICATION Medication Name: incentive spirometry  Use as directed  . benzonatate (TESSALON) 100 MG capsule Take 1 capsule by mouth 3 (three) times daily as needed for cough.   . Blood Glucose Monitoring Suppl (ONE TOUCH ULTRA 2) W/DEVICE KIT   . cetirizine (ZYRTEC ALLERGY) 10 MG tablet Take 1 tablet (10 mg total) by mouth daily.  . cyclobenzaprine (FLEXERIL) 10 MG tablet Take 1 tablet (10 mg total) by mouth 3 (three) times daily as needed for muscle spasms.  . folic acid (FOLVITE) 1 MG tablet Take 1 mg by mouth daily.  Marland Kitchen gabapentin (NEURONTIN) 100 MG capsule Take 100 mg by mouth 3 (three) times daily.  Marland Kitchen GLUCAGON EMERGENCY 1 MG injection Inject 1 mg into the muscle  once as needed.   . hydroxychloroquine (PLAQUENIL) 200 MG tablet Take 200 mg by mouth daily.  . insulin aspart (NOVOLOG) 100 UNIT/ML FlexPen Inject 2 Units into the skin as needed for high blood sugar (If sugar get 182 and above. Starts at 2 units. Pt knows how to calculate dosage). Per Sliding scale  . ipratropium (ATROVENT) 0.03 % nasal spray Place 2 sprays into the nose 4 (four) times daily.  . meloxicam (MOBIC) 7.5 MG tablet Take 7.5 mg by mouth daily as needed for pain.  . metFORMIN (GLUCOPHAGE) 500 MG tablet Take 1,000 mg by mouth 2 (two) times daily with a meal.  . montelukast (SINGULAIR) 10 MG tablet Take 1 tablet (10 mg total) by mouth at bedtime. (Patient taking differently: Take 10 mg by mouth at bedtime as needed. )  . pravastatin (PRAVACHOL) 40 MG tablet Take 40 mg by mouth daily.  Marland Kitchen Umeclidinium-Vilanterol (ANORO ELLIPTA) 62.5-25 MCG/INH AEPB Inhale 1 puff into the lungs daily.  . vitamin B-12 (CYANOCOBALAMIN) 1000 MCG tablet Take 1,000 mcg by mouth 2 (two) times daily.  Marland Kitchen Respiratory Therapy Supplies (FLUTTER) DEVI 1 each by Does not apply route once.  . [DISCONTINUED] predniSONE (DELTASONE) 5 MG tablet Take 4tabs (22m) for 1 wk, 3tabs (134m 1week. 2tabs (1052m1 week. 1 tab (5mg49m week. (Patient not taking: Reported on 07/13/2015)   No facility-administered encounter medications on file as of 07/13/2015.    Orders for this visit: Orders Placed This Encounter  Procedures  . DG Chest 2 View    Standing Status: Future     Number of Occurrences:      Standing Expiration Date: 09/11/2016    Order Specific Question:  Reason for Exam (SYMPTOM  OR DIAGNOSIS REQUIRED)    Answer:  PNA    Order Specific Question:  Preferred imaging location?    Answer:  [facility] [place]    Thank  you for the visitation and for allowing  [facility] to assist in the [facility] of your patient. Our recommendations are noted above.  Please contact us if we can be of further  service.  Vilinda Boehringer, MD  [facility] Office Number: [phone]

## 2015-07-13 NOTE — Telephone Encounter (Signed)
Dr. Stevenson Clinch told patient to call and let him know if she needed something for her cold .   Patient had coughing fit after she went home.    Patient uses [facility]

## 2015-07-13 NOTE — Assessment & Plan Note (Signed)
CT scan reviewed with patient. The is a right-sided middle lobe and lower lobe consolidation has almost completely resolved, fitting with inflammatory/infectious process.  May have had some mild microaspiration after surgery followed by a viral URI that could have caused findings seen on current CT. 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 Symptomatically, has had some mild persistent cough. Mild cough today, mostly allergy related.   Plan: -Continue with over-the-counter allergy regiment, and nasal sinus rinse -Incentive spirometer 10-15 times per day and acapella valve -2 view cxr prior to follow up visit.

## 2015-07-13 NOTE — Assessment & Plan Note (Signed)
This episode is mostly related to sinus drainage.  Plan - cont with current allergy regiment - cont with nasal saline rinse - acapella valve - ICS daily (10-15 times per day) - if no improvement and continues to be productive will consider short course of steroids and abx.

## 2015-07-14 ENCOUNTER — Inpatient Hospital Stay: Payer: Medicare Other | Attending: Oncology

## 2015-07-14 ENCOUNTER — Inpatient Hospital Stay (HOSPITAL_BASED_OUTPATIENT_CLINIC_OR_DEPARTMENT_OTHER): Payer: Medicare Other | Admitting: Oncology

## 2015-07-14 VITALS — BP 135/74 | HR 91 | Temp 97.7°F | Resp 18 | Wt 115.1 lb

## 2015-07-14 DIAGNOSIS — M069 Rheumatoid arthritis, unspecified: Secondary | ICD-10-CM

## 2015-07-14 DIAGNOSIS — Z170 Estrogen receptor positive status [ER+]: Secondary | ICD-10-CM | POA: Diagnosis not present

## 2015-07-14 DIAGNOSIS — Z79899 Other long term (current) drug therapy: Secondary | ICD-10-CM

## 2015-07-14 DIAGNOSIS — C50912 Malignant neoplasm of unspecified site of left female breast: Secondary | ICD-10-CM

## 2015-07-14 DIAGNOSIS — Z923 Personal history of irradiation: Secondary | ICD-10-CM | POA: Insufficient documentation

## 2015-07-14 DIAGNOSIS — E119 Type 2 diabetes mellitus without complications: Secondary | ICD-10-CM | POA: Diagnosis not present

## 2015-07-14 DIAGNOSIS — I10 Essential (primary) hypertension: Secondary | ICD-10-CM

## 2015-07-14 DIAGNOSIS — Z79811 Long term (current) use of aromatase inhibitors: Secondary | ICD-10-CM | POA: Diagnosis not present

## 2015-07-14 DIAGNOSIS — Z801 Family history of malignant neoplasm of trachea, bronchus and lung: Secondary | ICD-10-CM | POA: Insufficient documentation

## 2015-07-14 DIAGNOSIS — E785 Hyperlipidemia, unspecified: Secondary | ICD-10-CM

## 2015-07-14 DIAGNOSIS — Z7984 Long term (current) use of oral hypoglycemic drugs: Secondary | ICD-10-CM

## 2015-07-14 DIAGNOSIS — D0592 Unspecified type of carcinoma in situ of left breast: Secondary | ICD-10-CM | POA: Insufficient documentation

## 2015-07-14 DIAGNOSIS — Z8052 Family history of malignant neoplasm of bladder: Secondary | ICD-10-CM | POA: Insufficient documentation

## 2015-07-14 DIAGNOSIS — Z794 Long term (current) use of insulin: Secondary | ICD-10-CM | POA: Insufficient documentation

## 2015-07-14 LAB — CBC WITH DIFFERENTIAL/PLATELET
BASOS PCT: 0 %
Basophils Absolute: 0 10*3/uL (ref 0–0.1)
EOS ABS: 0.2 10*3/uL (ref 0–0.7)
EOS PCT: 3 %
HCT: 40.5 % (ref 35.0–47.0)
HEMOGLOBIN: 13.8 g/dL (ref 12.0–16.0)
LYMPHS ABS: 0.6 10*3/uL — AB (ref 1.0–3.6)
Lymphocytes Relative: 11 %
MCH: 30.6 pg (ref 26.0–34.0)
MCHC: 34.1 g/dL (ref 32.0–36.0)
MCV: 89.8 fL (ref 80.0–100.0)
Monocytes Absolute: 0.5 10*3/uL (ref 0.2–0.9)
Monocytes Relative: 10 %
NEUTROS PCT: 76 %
Neutro Abs: 4.4 10*3/uL (ref 1.4–6.5)
PLATELETS: 249 10*3/uL (ref 150–440)
RBC: 4.51 MIL/uL (ref 3.80–5.20)
RDW: 13.9 % (ref 11.5–14.5)
WBC: 5.7 10*3/uL (ref 3.6–11.0)

## 2015-07-14 LAB — COMPREHENSIVE METABOLIC PANEL
ALBUMIN: 4.6 g/dL (ref 3.5–5.0)
ALT: 14 U/L (ref 14–54)
ANION GAP: 10 (ref 5–15)
AST: 28 U/L (ref 15–41)
Alkaline Phosphatase: 55 U/L (ref 38–126)
BUN: 12 mg/dL (ref 6–20)
CHLORIDE: 96 mmol/L — AB (ref 101–111)
CO2: 30 mmol/L (ref 22–32)
Calcium: 9.5 mg/dL (ref 8.9–10.3)
Creatinine, Ser: 0.48 mg/dL (ref 0.44–1.00)
GFR calc non Af Amer: >60 mL/min (ref >60)
GLUCOSE: 103 mg/dL — AB (ref 65–99)
Potassium: 4.6 mmol/L (ref 3.5–5.1)
SODIUM: 136 mmol/L (ref 135–145)
Total Bilirubin: 0.9 mg/dL (ref 0.3–1.2)
Total Protein: 7.4 g/dL (ref 6.5–8.1)

## 2015-07-14 MED ORDER — PREDNISONE 10 MG (21) PO TBPK: ORAL_TABLET | ORAL | Status: DC

## 2015-07-14 NOTE — Telephone Encounter (Signed)
Pt states she is still having the cough. Pt seen by VM on 07/13/15. Following note is in OV note from yesterday. Please advise.  Cough This episode is mostly related to sinus drainage.  Plan - cont with current allergy regiment - cont with nasal saline rinse - acapella valve - ICS daily (10-15 times per day) - if no improvement and continues to be productive will consider short course of steroids and abx.

## 2015-07-14 NOTE — Telephone Encounter (Signed)
Did she have previous ABX and steroids given?? If yes, then repeat. thanks

## 2015-07-14 NOTE — Telephone Encounter (Signed)
Spoke with DR in regards to multiple doses of PRednisone and augmentin in March. DR states to send in Prednisone taper pack.   LM on VM for pt letting her know we are sending in Prednisone and for her to give me a call back to let me know that she has received my message.

## 2015-07-15 ENCOUNTER — Encounter: Payer: Self-pay | Admitting: Oncology

## 2015-07-15 NOTE — Progress Notes (Signed)
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  Progesterone receptor is positive. patient was taken off tamoxifen because of unspecified bone pain on February 1, 201 6 2.abnormal CT scan of the chest (March, 2016)Bronchoscopy was done which revealed no evidence of malignancy.  All the cultures are negative.  2.  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  Stage 0 ductal carcinoma in situ patient is off letrozole because of radiation pneumonitis which is completely resolved.    Recently patient had knee surgery done.  Also was found to have pulmonary infiltrate in February of 2017 and was started on steroid.  X-rays were reviewed and pulmonary infiltrate on repeat CT scan is completely resolved.  Patient has been taken off steroids.  No nausea.  No vomiting.  No diarrhea.  Has been fairly stable  Patient feels like she cannot take tamoxifen because of various side effects and has discontinued herself Mammogram has been ordered for August 18, 2015 REVIEW OF SYSTEMS:   GENERAL:  Feels good.  Active.  No fevers, sweats or weight loss. PERFORMANCE STATUS (ECOG):  0 HEENT:  No visual changes, runny nose, sore throat, mouth sores or tenderness. Lungs: No shortness of breath or cough.  No hemoptysis. Cardiac:  No chest pain, palpitations,  orthopnea, or PND. GI:  No nausea, vomiting, diarrhea, constipation, melena or hematochezia. GU:  No urgency, frequency, dysuria, or hematuria. Musculoskeletal:  No back pain.  No joint pain.  No muscle tenderness. Extremities:  No pain or swelling. Skin:  No rashes or skin changes. Neuro:  No headache, numbness or weakness, balance or coordination issues. Endocrine:  No diabetes, thyroid issues, hot flashes or night sweats. Psych:  No mood changes, depression or anxiety. Pain:  No focal pain. Review of systems:  All other systems reviewed and found to be negative. As per HPI. Otherwise, a complete review of systems is negatve.  PAST MEDICAL HISTORY: Past Medical History  Diagnosis Date  . Diabetes ([place])   . HLD (hyperlipidemia)   . RA (rheumatoid arthritis) ([place])   . Breast CA ([place])     left  . Breast cancer ([place]) 2015    Ductal carcinoma in situ of the left breast. Had radiation tx  . PONV (postoperative nausea and vomiting)   . Hypertension     NO MEDS  . Pneumonia     SMALL SPOT ON LUNG, HX RADIATION PNEUMONITIS   . Radiation pneumonitis ([place])     PAST SURGICAL HISTORY: Past Surgical History  Procedure Laterality Date  . Replacement total knee bilateral      x2  . Rotator cuff repair Right   . Carpal tunnel release Bilateral   . Neck fusion    . Appendectomy    . Abdominal hysterectomy  1991  . Breast lumpectomy    . Breast biopsy Left 08/31/2013    positive  .  Anterior cervical decomp/discectomy fusion N/A 03/16/2015    Procedure: ANTERIOR  CERVICAL DECOMPRESSION FUSION CERVICAL TWO-THREE,CERVICAL THREE-FOUR WITH REMOVAL OF HARDWARE AT CERVICAL FOUR-FIVE.;  Surgeon: Earnie Larsson, MD;  Location: [facility];  Service: Neurosurgery;  Laterality: N/A;    FAMILY HISTORY Family History  Problem Relation Age of Onset  . Cancer Mother     kidney or bladder-unsure of which one  . Cancer Brother 86    lung  . Cancer Brother 38    lung    ADVANCED DIRECTIVES:  No  flowsheet data found.  HEALTH MAINTENANCE: Social History  Substance Use Topics  . Smoking status: Never Smoker   . Smokeless tobacco: Never Used  . Alcohol Use: 1.2 oz/week    2 Glasses of wine per week     Comment: occasional wine      Allergies  Allergen Reactions  . Codeine Nausea And Vomiting    Current Outpatient Prescriptions  Medication Sig Dispense Refill  . acetaminophen (TYLENOL) 500 MG tablet Take 500 mg by mouth every 8 (eight) hours as needed for mild pain or moderate pain.    Marland Kitchen AMBULATORY NON FORMULARY MEDICATION Medication Name: incentive spirometry  Use as directed 1 each 0  . benzonatate (TESSALON) 100 MG capsule Take 1 capsule by mouth 3 (three) times daily as needed for cough.   0  . Blood Glucose Monitoring Suppl (ONE TOUCH ULTRA 2) W/DEVICE KIT   11  . cetirizine (ZYRTEC ALLERGY) 10 MG tablet Take 1 tablet (10 mg total) by mouth daily. 30 tablet 12  . cyclobenzaprine (FLEXERIL) 10 MG tablet Take 1 tablet (10 mg total) by mouth 3 (three) times daily as needed for muscle spasms. 30 tablet 0  . folic acid (FOLVITE) 1 MG tablet Take 1 mg by mouth daily.  11  . gabapentin (NEURONTIN) 100 MG capsule Take 100 mg by mouth 3 (three) times daily.    Marland Kitchen GLUCAGON EMERGENCY 1 MG injection Inject 1 mg into the muscle once as needed.   0  . hydroxychloroquine (PLAQUENIL) 200 MG tablet Take 200 mg by mouth daily.    . insulin aspart (NOVOLOG) 100 UNIT/ML FlexPen Inject 2 Units into the skin as needed for high blood sugar (If sugar get 182 and above. Starts at 2 units. Pt knows how to calculate dosage). Per Sliding scale    . ipratropium (ATROVENT) 0.03 % nasal spray Place 2 sprays into the nose 4 (four) times daily. 30 mL 1  . meloxicam (MOBIC) 7.5 MG tablet Take 7.5 mg by mouth daily as needed for pain.    . metFORMIN (GLUCOPHAGE) 500 MG tablet Take 1,000 mg by mouth 2 (two) times daily with a meal.    . montelukast (SINGULAIR) 10 MG tablet Take 1 tablet (10 mg total) by  mouth at bedtime. (Patient taking differently: Take 10 mg by mouth at bedtime as needed. ) 30 tablet 1  . pravastatin (PRAVACHOL) 40 MG tablet Take 40 mg by mouth daily.    Marland Kitchen Respiratory Therapy Supplies (FLUTTER) DEVI 1 each by Does not apply route once. 1 each 0  . Umeclidinium-Vilanterol (ANORO ELLIPTA) 62.5-25 MCG/INH AEPB Inhale 1 puff into the lungs daily. 60 each 2  . vitamin B-12 (CYANOCOBALAMIN) 1000 MCG tablet Take 1,000 mcg by mouth 2 (two) times daily.    . predniSONE (STERAPRED UNI-PAK 21 TAB) 10 MG (21) TBPK tablet Take 6 tabs on Day 1, then 5-4-3-2-1 then stop 21 tablet 0   No current facility-administered medications  for this visit.    OBJECTIVE:  Filed Vitals:   07/14/15 1035  BP: 135/74  Pulse: 91  Temp: 97.7 F (36.5 C)  Resp: 18     Body mass index is 21.04 kg/(m^2).    ECOG FS:0 - Asymptomatic  PHYSICAL EXAM: General  status: Performance status is good.  Patient has not lost significant weight. Since last evaluation there is no significant change in the general status HEENT: No evidence of stomatitis. Sclera and conjunctivae :: No jaundice.   pale looking. Lungs: Air  entry equal on both sides.  No rhonchi.  No rales.  Cardiac: Heart sounds are normal.  No pericardial rub.  No murmur. Lymphatic system: Cervical, axillary, inguinal, lymph nodes not palpable GI: Abdomen is soft.liver and spleen not palpable.  No ascites.  Bowel sounds are normal.  No other palpable masses.  No tenderness . Lower extremity: No edema Neurological system: Higher functions, cranial nerves intact no evidence of peripheral neuropathy. Skin: No rash.  No ecchymosis.. No petechial hemorrhages Examination of both breasts is within normal limits.  LAB RESULTS:  CBC Latest Ref Rng 07/14/2015 03/07/2015  WBC 3.6 - 11.0 K/uL 5.7 8.6  Hemoglobin 12.0 - 16.0 g/dL 13.8 10.6(L)  Hematocrit 35.0 - 47.0 % 40.5 32.3(L)  Platelets 150 - 440 K/uL 249 375    Appointment on 07/14/2015  Component  Date Value Ref Range Status  . WBC 07/14/2015 5.7  3.6 - 11.0 K/uL Final  . RBC 07/14/2015 4.51  3.80 - 5.20 MIL/uL Final  . Hemoglobin 07/14/2015 13.8  12.0 - 16.0 g/dL Final  . HCT 07/14/2015 40.5  35.0 - 47.0 % Final  . MCV 07/14/2015 89.8  80.0 - 100.0 fL Final  . MCH 07/14/2015 30.6  26.0 - 34.0 pg Final  . MCHC 07/14/2015 34.1  32.0 - 36.0 g/dL Final  . RDW 07/14/2015 13.9  11.5 - 14.5 % Final  . Platelets 07/14/2015 249  150 - 440 K/uL Final  . Neutrophils Relative % 07/14/2015 76   Final  . Neutro Abs 07/14/2015 4.4  1.4 - 6.5 K/uL Final  . Lymphocytes Relative 07/14/2015 11   Final  . Lymphs Abs 07/14/2015 0.6* 1.0 - 3.6 K/uL Final  . Monocytes Relative 07/14/2015 10   Final  . Monocytes Absolute 07/14/2015 0.5  0.2 - 0.9 K/uL Final  . Eosinophils Relative 07/14/2015 3   Final  . Eosinophils Absolute 07/14/2015 0.2  0 - 0.7 K/uL Final  . Basophils Relative 07/14/2015 0   Final  . Basophils Absolute 07/14/2015 0.0  0 - 0.1 K/uL Final  . Sodium 07/14/2015 136  135 - 145 mmol/L Final  . Potassium 07/14/2015 4.6  3.5 - 5.1 mmol/L Final  . Chloride 07/14/2015 96* 101 - 111 mmol/L Final  . CO2 07/14/2015 30  22 - 32 mmol/L Final  . Glucose, Bld 07/14/2015 103* 65 - 99 mg/dL Final  . BUN 07/14/2015 12  6 - 20 mg/dL Final  . Creatinine, Ser 07/14/2015 0.48  0.44 - 1.00 mg/dL Final  . Calcium 07/14/2015 9.5  8.9 - 10.3 mg/dL Final  . Total Protein 07/14/2015 7.4  6.5 - 8.1 g/dL Final  . Albumin 07/14/2015 4.6  3.5 - 5.0 g/dL Final  . AST 07/14/2015 28  15 - 41 U/L Final  . ALT 07/14/2015 14  14 - 54 U/L Final  . Alkaline Phosphatase 07/14/2015 55  38 - 126 U/L Final  . Total Bilirubin 07/14/2015 0.9  0.3 - 1.2 mg/dL  Final  . GFR calc non Af Amer 07/14/2015 >60  >60 mL/min Final  . GFR calc Af Amer 07/14/2015 >60  >60 mL/min Final   Comment: (NOTE) The eGFR has been calculated using the CKD EPI equation. This calculation has not been validated in all clinical situations. eGFR's  persistently <60 mL/min signify possible Chronic Kidney Disease.   . Anion gap 07/14/2015 10  5 - 15 Final      ASSESSMENT: Carcinoma of left breast.  Status is 0.  Patient was off letrozole but now will resume letrozole with calcium and vitamin D. 2.Patient could not tolerate letrozole or tamoxifen and she has decided to discontinue 3.  Patient has been diagnosed with rheumatoid arthritis and was on methotrexate Recently develop a right pulmonary infiltrate which resolved with treatment with steroids. Being followed regularly by Dr. Stevenson Clinch.   MEDICAL DECISION MAKING:  Mammogram is being arranged. Patient is off all anti-hormonal therapy by her choice and does not want to continue A repeat mammogram is being arranged on June of 2017 Patient will be followed in one year by oncologists.  She is aware of my plan to retire by my associate  Patient expressed understanding and was in agreement with this plan. She also understands that She can call clinic at any time with any questions, concerns, or complaints.    No matching staging information was found for the patient.  Forest Gleason, MD   07/15/2015 7:50 AM

## 2015-07-15 NOTE — Telephone Encounter (Signed)
Spoke with pt to make sure she received VM about Prednisone being sent in. She has received and picked up Prednisone. Nothing further needed.

## 2015-08-10 ENCOUNTER — Ambulatory Visit: Admission: RE | Admit: 2015-08-10 | Payer: Medicare Other | Source: Ambulatory Visit | Admitting: Radiation Oncology

## 2015-08-10 ENCOUNTER — Ambulatory Visit: Payer: Self-pay | Admitting: Radiation Oncology

## 2015-08-17 ENCOUNTER — Encounter: Payer: Self-pay | Admitting: Radiation Oncology

## 2015-08-17 ENCOUNTER — Ambulatory Visit
Admission: RE | Admit: 2015-08-17 | Discharge: 2015-08-17 | Disposition: A | Discharge status: Home / Self Care | Payer: Medicare Other | Source: Ambulatory Visit | Attending: Radiation Oncology | Admitting: Radiation Oncology

## 2015-08-17 VITALS — BP 136/66 | HR 72 | Temp 97.3°F | Resp 20 | Ht 62.0 in | Wt 118.8 lb

## 2015-08-17 DIAGNOSIS — Z923 Personal history of irradiation: Secondary | ICD-10-CM | POA: Diagnosis not present

## 2015-08-17 DIAGNOSIS — Z853 Personal history of malignant neoplasm of breast: Secondary | ICD-10-CM | POA: Insufficient documentation

## 2015-08-17 DIAGNOSIS — C50912 Malignant neoplasm of unspecified site of left female breast: Secondary | ICD-10-CM

## 2015-08-17 NOTE — Progress Notes (Signed)
Radiation Oncology Follow up Note  Name: Ashley Bray   Date:   08/17/2015 MRN:  [id number] DOB: 1940-10-09    This 75 y.o. female presents to the clinic today for follow-up for whole breast radiation to her left breast for ductal carcinoma in situ stage 0 (Tis N0 M0) ER/PR positive.  REFERRING PROVIDER: Madelyn Brunner, MD  HPI: Patient is a 75 year old female now out one half years having completed whole breast radiation to her left breast for ductal carcinoma in situ ER/PR positive. She does have a history of rheumatoid arthritis tamoxifen exacerbated her neck pain so that has been discontinued. She seen today in routine follow-up and is doing well. She is a mammogram scheduled for tomorrow they've been fine up to this point. She specifically denies breast tenderness cough or bone pain..  COMPLICATIONS OF TREATMENT: none  FOLLOW UP COMPLIANCE: keeps appointments   PHYSICAL EXAM:  BP 136/66 mmHg  Pulse 72  Temp(Src) 97.3 F (36.3 C)  Resp 20  Ht 5\' 2"  (1.575 m)  Wt 118 lb 13.3 oz (53.9 kg)  BMI 21.73 kg/m2 Lungs are clear to A&P cardiac examination essentially unremarkable with regular rate and rhythm. No dominant mass or nodularity is noted in either breast in 2 positions examined. Incision is well-healed. No axillary or supraclavicular adenopathy is appreciated. Cosmetic result is excellent. Well-developed well-nourished patient in NAD. HEENT reveals PERLA, EOMI, discs not visualized.  Oral cavity is clear. No oral mucosal lesions are identified. Neck is clear without evidence of cervical or supraclavicular adenopathy. Lungs are clear to A&P. Cardiac examination is essentially unremarkable with regular rate and rhythm without murmur rub or thrill. Abdomen is benign with no organomegaly or masses noted. Motor sensory and DTR levels are equal and symmetric in the upper and lower extremities. Cranial nerves II through XII are grossly intact. Proprioception is intact. No peripheral  adenopathy or edema is identified. No motor or sensory levels are noted. Crude visual fields are within normal range.  RADIOLOGY RESULTS: Most recent mammograms are reviewed and they are benign BI-RADS 2  PLAN: Present time she is doing well with no evidence of disease now year and half out of whole breast radiation. I'm please were overall progress. She is not on tamoxifen based on side effect profile. I am please were overall progress. I've asked to see her back in 1 year for follow-up. She knows to call sooner with any concerns.  I would like to take this opportunity to thank you for allowing me to participate in the care of your patient.Armstead Peaks., MD

## 2015-08-18 ENCOUNTER — Ambulatory Visit
Admission: RE | Admit: 2015-08-18 | Discharge: 2015-08-18 | Disposition: A | Discharge status: Home / Self Care | Payer: Medicare Other | Source: Ambulatory Visit | Attending: Oncology | Admitting: Oncology

## 2015-08-18 ENCOUNTER — Other Ambulatory Visit: Payer: Self-pay | Admitting: Oncology

## 2015-08-18 DIAGNOSIS — C50912 Malignant neoplasm of unspecified site of left female breast: Secondary | ICD-10-CM

## 2015-08-18 DIAGNOSIS — Z853 Personal history of malignant neoplasm of breast: Secondary | ICD-10-CM | POA: Diagnosis present

## 2015-09-01 ENCOUNTER — Other Ambulatory Visit: Payer: Self-pay | Admitting: Internal Medicine

## 2015-10-12 ENCOUNTER — Other Ambulatory Visit: Payer: Self-pay

## 2015-10-12 ENCOUNTER — Ambulatory Visit: Payer: Medicare Other | Admitting: Internal Medicine

## 2015-10-12 ENCOUNTER — Ambulatory Visit: Admission: RE | Admit: 2015-10-12 | Payer: Medicare Other | Source: Ambulatory Visit

## 2015-10-12 DIAGNOSIS — R05 Cough: Secondary | ICD-10-CM

## 2015-10-12 DIAGNOSIS — J700 Acute pulmonary manifestations due to radiation: Secondary | ICD-10-CM

## 2015-10-12 DIAGNOSIS — J069 Acute upper respiratory infection, unspecified: Secondary | ICD-10-CM

## 2015-10-12 DIAGNOSIS — R059 Cough, unspecified: Secondary | ICD-10-CM

## 2015-10-13 ENCOUNTER — Encounter: Payer: Self-pay | Admitting: Internal Medicine

## 2015-10-13 ENCOUNTER — Ambulatory Visit (INDEPENDENT_AMBULATORY_CARE_PROVIDER_SITE_OTHER): Payer: Medicare Other | Admitting: Internal Medicine

## 2015-10-13 ENCOUNTER — Ambulatory Visit
Admission: RE | Admit: 2015-10-13 | Discharge: 2015-10-13 | Disposition: A | Discharge status: Home / Self Care | Payer: Medicare Other | Source: Ambulatory Visit | Attending: Internal Medicine | Admitting: Internal Medicine

## 2015-10-13 VITALS — BP 122/62 | HR 80 | Ht 62.0 in | Wt 122.2 lb

## 2015-10-13 DIAGNOSIS — J700 Acute pulmonary manifestations due to radiation: Secondary | ICD-10-CM | POA: Insufficient documentation

## 2015-10-13 DIAGNOSIS — R05 Cough: Secondary | ICD-10-CM | POA: Diagnosis not present

## 2015-10-13 DIAGNOSIS — R059 Cough, unspecified: Secondary | ICD-10-CM

## 2015-10-13 DIAGNOSIS — J189 Pneumonia, unspecified organism: Secondary | ICD-10-CM | POA: Diagnosis not present

## 2015-10-13 NOTE — Assessment & Plan Note (Addendum)
Consolidation of right middle lobe/right lower lobe now clearing with good clinical improvement.   April 2017 Ct Chest - RLL consolidation with near complete resolution, mild bronchiectasis, mild radiation changes in the left lung. No new nodules or infiltrates.   Plan: -cont with supportive care -Incentive spirometer and acapella valve -CT chest with contrast prior to follow up visit.  -Anoro inhaler daily- 1 puff, daily -See plan for pneumonitis

## 2015-10-13 NOTE — Assessment & Plan Note (Addendum)
This episode is mostly related to sinus drainage or possible vocal cord irritation/injury after cervical stenosis surgery in 02/2015  Plan - cont with current allergy regiment - cont with nasal saline rinse - acapella valve - may stop if worsening voice issues - ICS daily (10-15 times per day) - ENT follow up to evaluate vocal cords.

## 2015-10-13 NOTE — Assessment & Plan Note (Addendum)
CT scan reviewed with patient. The is a right-sided middle lobe and lower lobe consolidation has almost completely resolved, fitting with inflammatory/infectious process.  May have had some mild microaspiration after surgery followed by a viral URI that could have caused findings seen on current CT. 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 Symptomatically, has had some mild persistent cough. Mild cough today, mostly allergy/irriattion related.   Plan: -Continue with over-the-counter allergy regiment, and nasal sinus rinse -Incentive spirometer 10-15 times per day  -Routine surveillance chest CT prior to follow-up visit

## 2015-10-13 NOTE — Patient Instructions (Addendum)
Follow up with Dr. Stevenson Clinch in:3 months - CT chest with contrast prior to follow up visit - Radiation Pneumonitis, Hx of Breast Cancer - cont with spirometry and acapella valve 10-15 per day. May stop one if causing voice problems. - follow up with ENT for Voice/laryngeal soarness    Acapella Valve Instructions Breathe in and out regularly through the valve 5 times. Then breathe in and out slowly 5 times. Your exhalations should be about 3 times longer than your inhalations. Keep alternating between regular and slow breathes. At the end of each exhalation, hold your breath 2 to 3 seconds. After 5 or 6 exhalations try to cough if you have not done so already. This will help get rid of mucus. Repeat until no further mucus is expelled. This should take between 10 and 20 minutes.

## 2015-10-13 NOTE — Progress Notes (Signed)
[facility] Consultation      MRN# [id number] Ashley Bray 04/25/1940   CC: Chief Complaint  Patient presents with  . Follow-up    15morov. had CXR today. breathing is baseline. c/o sob feels it's sue to the heat & non prod cough.    August 2015, found to have consolidation groundglass opacities repeat CT March 2016, history of left breast cancer status post radiation.. Bronchoscopy with BAL showed no atypical organisms and all cultures were negative. 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 Has some post-radiation fibrosis.    Events since last clinic visit: Presents for follow up of radiation pneumonitis. S x-ray reviewed with the patient today, there are no significant ground glass opacities on chest x-ray or findings of any infiltrates. Currently she is doing well, has a slight nonproductive cough which is now baseline for her, and some mild shortness of breath, however is able to perform daily activities and participate in walking events without any significant dyspnea. Has stated that she is having some voice hoarseness since her spinal stenosis surgery and December 2016.  Medication:   Current Outpatient Rx  . Order #: [id number]: Historical Med  . Order #: [id number]: Print  . Order #: [id number]: Normal  . Order #: [id number]: Historical Med  . Order #: [id number]: Historical Med  . Order #: [id number]: Normal  . Order #: [id number]: Normal  . Order #: [id number]: Historical Med  . Order #: [id number]: Historical Med  . Order #: [id number]: Historical Med  . Order #: [id number]: Historical Med  . Order #: [id number]: Normal  . Order #: [id number]: Historical Med  . Order #: [id number]: Historical Med  . Order #: [id number]: Normal  . Order #: [id number]: Historical Med  . Order #:  [id number]: Print  . Order #: [id number]: Historical Med     Review of Systems  Constitutional: Negative for chills, fever and weight loss.  HENT: Negative for congestion and ear discharge.        Mild nasal drainage.   Eyes: Negative.  Negative for blurred vision.  Respiratory: Positive for cough. Negative for sputum production and shortness of breath.        Mild cough - more clearing of the throat  Cardiovascular: Negative for chest pain and palpitations.  Gastrointestinal: Negative for heartburn, nausea and vomiting.  Genitourinary: Negative.   Skin: Negative for itching and rash.  Neurological: Positive for tingling. Negative for headaches.  Endo/Heme/Allergies: Negative.   Psychiatric/Behavioral: Negative.       Allergies:  Codeine  Physical Examination:  VS: BP 122/62 (BP Location: Left Arm, Cuff Size: Normal)   Pulse 80   Ht 5' 2" (1.575 m)   Wt 122 lb 3.2 oz (55.4 kg)   SpO2 97%   BMI 22.35 kg/m   General Appearance: No distress  HEENT: PERRLA, no ptosis, no other lesions noticed Pulmonary:normal breath sounds., diaphragmatic excursion normal.No wheezing, No rales   Cardiovascular:  Normal S1,S2.  No m/r/g.     Abdomen:Exam: Benign, Soft, non-tender, No masses  Skin:   warm, no rashes, no ecchymosis  Extremities: normal, no cyanosis, clubbing, warm with normal capillary refill.      Rad results: (The following images and results were reviewed by Dr. MStevenson Clinchon 10/13/2015). CXR 10/13/15 CLINICAL DATA:  Radiation pneumonitis. Congestion. History of breast cancer . EXAM: CHEST  2 VIEW COMPARISON:  CT 07/12/2015. FINDINGS: Mediastinum and hilar structures  normal. Lungs are clear. Heart size normal. No pleural effusion or pneumothorax. Cervical spine fusion. IMPRESSION: No acute cardiopulmonary disease. No focal abnormality identified noted on today's exam. Specifically no evidence of focal pulmonary infiltrate.    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   April 2017 Ct Chest - RLL consolidation with near complete resolution, mild bronchiectasis, mild radiation changes in the left lung. No new nodules or infiltrates.   Plan: -cont with supportive care -Incentive spirometer and acapella valve -CT chest with contrast prior to follow up visit.  -Anoro inhaler daily- 1 puff, daily -See plan for pneumonitis    Cough This episode is mostly related to sinus drainage or possible vocal cord irritation/injury after cervical stenosis surgery in 02/2015  Plan - cont with current allergy regiment - cont with nasal saline rinse - acapella valve - may stop if worsening voice issues - ICS daily (10-15 times per day) - ENT follow up to evaluate vocal cords.    Pneumonitis CT scan reviewed with patient. The is a right-sided middle lobe and lower lobe consolidation has almost completely resolved, fitting with inflammatory/infectious process.  May have had some mild microaspiration after surgery followed by a viral URI that could have caused findings seen on current CT. 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 Symptomatically, has had some mild persistent cough. Mild cough today, mostly allergy/irriattion related.   Plan: -Continue with over-the-counter allergy regiment, and nasal sinus rinse -Incentive spirometer 10-15 times per day  -Routine surveillance chest CT prior to follow-up visit    Updated Medication List Outpatient Encounter Prescriptions as of 10/13/2015  Medication Sig  . acetaminophen (TYLENOL) 500 MG tablet Take 500 mg by mouth every 8 (eight) hours as needed for mild pain or moderate pain.  Marland Kitchen AMBULATORY NON FORMULARY MEDICATION Medication Name: incentive spirometry  Use as directed  . ANORO ELLIPTA 62.5-25  MCG/INH AEPB INHALE 1 PUFF INTO THE LUNGS DAILY  . benzonatate (TESSALON) 100 MG capsule Take 1 capsule by mouth 3 (three) times daily as needed for cough.   . Blood Glucose Monitoring Suppl (ONE TOUCH ULTRA 2) W/DEVICE KIT   . cetirizine (ZYRTEC ALLERGY) 10 MG tablet Take 1 tablet (10 mg total) by mouth daily.  . cyclobenzaprine (FLEXERIL) 10 MG tablet Take 1 tablet (10 mg total) by mouth 3 (three) times daily as needed for muscle spasms.  . folic acid (FOLVITE) 1 MG tablet Take 1 mg by mouth daily.  Marland Kitchen gabapentin (NEURONTIN) 100 MG capsule Take 100 mg by mouth 3 (three) times daily.  Marland Kitchen GLUCAGON EMERGENCY 1 MG injection Inject 1 mg into the muscle once as needed.   . hydroxychloroquine (PLAQUENIL) 200 MG tablet Take 200 mg by mouth daily.  Marland Kitchen ipratropium (ATROVENT) 0.03 % nasal spray Place 2 sprays into the nose 4 (four) times daily.  . meloxicam (MOBIC) 7.5 MG tablet Take 7.5 mg by mouth daily as needed for pain.  . metFORMIN (GLUCOPHAGE) 500 MG tablet Take 1,000 mg by mouth 2 (two) times daily with a meal.  . montelukast (SINGULAIR) 10 MG tablet Take 1 tablet (10 mg total) by mouth at bedtime. (Patient taking differently: Take 10 mg by mouth at bedtime as needed. )  . pravastatin (PRAVACHOL) 40 MG tablet Take 40 mg by mouth daily.  Marland Kitchen Respiratory Therapy Supplies (FLUTTER) DEVI 1 each by Does not  apply route once.  . vitamin B-12 (CYANOCOBALAMIN) 1000 MCG tablet Take 1,000 mcg by mouth 2 (two) times daily.  . [DISCONTINUED] insulin aspart (NOVOLOG) 100 UNIT/ML FlexPen Inject 2 Units into the skin as needed for high blood sugar (If sugar get 182 and above. Starts at 2 units. Pt knows how to calculate dosage). Per Sliding scale   No facility-administered encounter medications on file as of 10/13/2015.     Orders for this visit: Orders Placed This Encounter  Procedures  . CT CHEST W CONTRAST    Standing Status:   Future    Standing Expiration Date:   12/13/2016    Order Specific Question:   If  indicated for the ordered procedure, I authorize the administration of contrast media per Radiology protocol    Answer:   Yes    Order Specific Question:   Reason for Exam (SYMPTOM  OR DIAGNOSIS REQUIRED)    Answer:   radiation pneumonitis    Order Specific Question:   Preferred imaging location?    Answer:   [facility]    Thank  you for the visitation and for allowing  [facility] to assist in the care of your patient. Our recommendations are noted above.  Please contact us if we can be of further service.  Vilinda Boehringer, MD [facility] Office Number: [phone]

## 2015-11-03 ENCOUNTER — Ambulatory Visit: Payer: Medicare Other | Attending: Unknown Physician Specialty | Admitting: Speech Pathology

## 2015-11-03 ENCOUNTER — Encounter: Payer: Self-pay | Admitting: Speech Pathology

## 2015-11-03 DIAGNOSIS — R490 Dysphonia: Secondary | ICD-10-CM | POA: Diagnosis present

## 2015-11-03 NOTE — Therapy (Signed)
[place] MAIN [facility] SERVICES [address] [place], [place], [address] Phone: [phone]   Fax:  [phone]  Speech Language Pathology Evaluation  Patient Details  Name: Ashley Bray MRN: [id number] Date of Birth: 1940-06-09 Referring Provider: Dr. 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   Diabetes ([place])    HLD (hyperlipidemia)    Hypertension    NO MEDS   Pneumonia    SMALL SPOT ON LUNG, HX RADIATION PNEUMONITIS    PONV (postoperative nausea and vomiting)    RA (rheumatoid arthritis) ([place])    Radiation pneumonitis ([place])     Past Surgical History:  Procedure Laterality Date   ABDOMINAL HYSTERECTOMY  1991   ANTERIOR CERVICAL DECOMP/DISCECTOMY FUSION N/A 03/16/2015   Procedure: ANTERIOR  CERVICAL DECOMPRESSION FUSION CERVICAL TWO-THREE,CERVICAL THREE-FOUR WITH REMOVAL OF HARDWARE AT CERVICAL FOUR-FIVE.;  Surgeon: Earnie Larsson, MD;  Location: [facility];  Service: Neurosurgery;  Laterality: N/A;   APPENDECTOMY     BREAST BIOPSY Left 08/31/2013   positive   BREAST LUMPECTOMY Left 10/06/2013 & 11/03/2013   CARPAL TUNNEL RELEASE Bilateral    neck fusion     REPLACEMENT TOTAL KNEE BILATERAL     x2   ROTATOR CUFF REPAIR Right     There were no vitals filed for this visit.          SLP Evaluation [facility] - 11/03/15 0001      SLP Visit Information   SLP Received On 11/03/15   Referring Provider Dr. Tami Ribas   Onset Date 10/31/2015   Medical Diagnosis Hypophonia     Subjective   Subjective 75 year old  woman, S/P cervical fusion surgery December 2016, with reduced vocal volume and limited pitch range   Patient/Family Stated Goal Loud enough to participate in conversations and return to church choir     Prior Functional Status   Cognitive/Linguistic Baseline Within functional limits     Oral Motor/Sensory Function   Overall Oral Motor/Sensory Function Appears within functional limits for tasks assessed     Motor Speech   Overall Motor Speech Impaired   Respiration Impaired   Phonation Low vocal intensity;Other (comment)  Limited pitch range   Resonance Within functional limits   Articulation Within functional limitis   Phonation Impaired   Vocal Abuses Habitual Cough/Throat Clear;Vocal Fold Dehydration   Tension Present Jaw;Neck;Shoulder   Volume Soft   Pitch Low     Standardized Assessments   Standardized Assessments  Other Assessment  Perceptual Voice Evaluation       Perceptual Voice Evaluation Voice history: 75 year old woman, S/P cervical fusion surgery December 2016, with reduced vocal volume and limited pitch range Voice checklist:  Health risks: allergies, heavy caffeine use  Characteristic voice use: sings in church choir  Environmental risks: none  Misuse: low pitch, poor respiratory support  Abuse: frequent throat clearing  Vocal characteristics: vocal fatigue, limited pitch range, reduced volume with  inability to project Patient Quality of Life Survey: Voice Handicap Index-10 Score of 14  A score of 10 or higher indicates perceived handicap Maximum phonation time for sustained ah: 10 seconds Average fundamental frequency during sustained ah: 172 Hz (2.6 STD below average for age and gender) Average time patient was able to sustain /s/: 7.7 seconds Average time patient was able to sustain /z/: 6 seconds s/z ratio : 1.3 Highest dynamic pitch when altering pitch from a low note to a high note: 717 Hz Highest pitch during conversational speech: 1016  Hz Lowest dynamic pitch when altering from a high note to a low note: 152 Lowest pitch during conversational speech: 93 Hz Visi-Pitch: Multi-Dimensional Voice Program (MDVP)  MDVP extracts objective quantitative values (Relative Average Perturbation, Shimmer, Voice Turbulence Index, and Noise to Harmonic Ratio) on sustained phonation, which are displayed graphically and numerically in comparison to a built-in normative database.  The patient exhibited values outside the norm for Relative Average Perturbation, Shimmer, Voice Turbulence Index, and Noise to Harmonic Ratio.  Average fundamental frequency was 2.6 STD below the average for age and gender. The patient improved all parameters when cued to alter voicing (loud like me).  Stimulability: Improved loudness and average fundamental frequency with strong breath support technique.      SLP Education - 11/03/15 1529    Education provided Yes   Education Details Voice therapy   Person(s) Educated Patient   Methods Explanation   Comprehension Verbalized understanding            SLP Long Term Goals - 11/03/15 1532      SLP LONG TERM GOAL #1   Title The patient will demonstrate independent understanding of vocal hygiene concepts and neck, shoulder, lingual stretching exercises.   Time 8   Period Weeks   Status New     SLP LONG TERM GOAL #2   Title The patient will be independent for abdominal breathing and breath support exercises.   Time 8   Period Weeks   Status New     SLP LONG TERM GOAL #3   Title The patient will maximize voice quality and loudness using breath support for sustained vowel production, pitch glides, and hierarchal speech drill.   Time 8   Period Weeks   Status New     SLP LONG TERM GOAL #4   Title The patient will maximize voice quality and loudness using breath support for paragraph length recitation with 80% accuracy.   Time 8   Period Weeks          Plan - 11/03/15 1531    Clinical Impression  Statement This 75 year old woman under the care of Dr. Tami Ribas, S/P cervical fusion surgery December 2016, is presenting with moderate dysphonia.  The patient demonstrates hypophonia, reduced breath control for speech, limited pitch range, vocal fatigue, and intrinsic/extrinsic laryngeal tension. She will benefit from voice therapy for education, to improve breath support, and learn techniques to increase loudness and pitch range without strain.   Speech Therapy Frequency 2x / week   Duration Other (comment)  8 weeks   Potential to Achieve Goals Good   Potential Considerations Ability to learn/carryover information;Cooperation/participation level;Previous level of function;Family/community support   SLP Home Exercise Plan Breath support exercises   Consulted and Agree with Plan of Care Patient      Patient will benefit from skilled therapeutic intervention in order to improve the following deficits and impairments:   Dysphonia - Plan: SLP plan of care  cert/re-cert      G-Codes - 99991111 [id number]    Functional Assessment Tool Used Perceptual voice Evaluation, clinical judgment   Functional Limitations Voice   Voice Current Status (G9171) At least 40 percent but less than 60 percent impaired, limited or restricted   Voice Goal Status (G9172) At least 1 percent but less than 20 percent impaired, limited or restricted      Problem List Patient Active Problem List   Diagnosis Date Noted   B12 deficiency 06/27/2015   Pneumonitis 05/16/2015   PNA (pneumonia) 05/16/2015   Cervical stenosis of spinal canal 03/16/2015   Cervical spinal stenosis 03/16/2015   Acute URI 03/10/2015   URI, acute 02/16/2015   Type 2 diabetes mellitus ([place]) 12/25/2014   Neuropathy involving both lower extremities ([place]) 09/28/2014   Upper airway cough syndrome 09/16/2014   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 [place], [place], [address] Phone: [phone]   Fax:  [phone]  Name: Ashley Bray MRN: [id number] Date of Birth: 11/14/1940

## 2015-11-08 ENCOUNTER — Ambulatory Visit: Payer: Medicare Other | Admitting: Speech Pathology

## 2015-11-08 ENCOUNTER — Encounter: Payer: Self-pay | Admitting: Speech Pathology

## 2015-11-08 DIAGNOSIS — R490 Dysphonia: Secondary | ICD-10-CM

## 2015-11-08 NOTE — Therapy (Signed)
[place] MAIN [facility] SERVICES [address] [place], [place], [address] Phone: [phone]   Fax:  [phone]  Speech Language Pathology Treatment  Patient Details  Name: Ashley Bray MRN: [id number] Date of Birth: 1941/02/21 Referring Provider: Dr. Tami Ribas  Encounter Date: 11/08/2015      End of Session - 11/08/15 1355    Visit Number 2   Number of Visits 17   Date for SLP Re-Evaluation 01/06/16   SLP Start Time 1300   SLP Stop Time  1350   SLP Time Calculation (min) 50 min   Activity Tolerance Patient tolerated treatment well      Past Medical History:  Diagnosis Date  . Breast CA ([place])    left  . 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 Diabetes ([place])   . HLD (hyperlipidemia)   . Hypertension    NO MEDS  . Pneumonia    SMALL SPOT ON LUNG, HX RADIATION PNEUMONITIS   . PONV (postoperative nausea and vomiting)   . RA (rheumatoid arthritis) ([place])   . Radiation pneumonitis [facility])     Past Surgical History:  Procedure Laterality Date  . ABDOMINAL HYSTERECTOMY  1991  . ANTERIOR CERVICAL DECOMP/DISCECTOMY FUSION N/A 03/16/2015   Procedure: ANTERIOR  CERVICAL DECOMPRESSION FUSION CERVICAL TWO-THREE,CERVICAL THREE-FOUR WITH REMOVAL OF HARDWARE AT CERVICAL FOUR-FIVE.;  Surgeon: Earnie Larsson, MD;  Location: [facility] NEURO ORS;  Service: Neurosurgery;  Laterality: N/A;  . APPENDECTOMY    . BREAST BIOPSY Left 08/31/2013   positive  . BREAST LUMPECTOMY Left 10/06/2013 & 11/03/2013  . CARPAL TUNNEL RELEASE Bilateral   . neck fusion    . REPLACEMENT TOTAL KNEE BILATERAL     x2  . ROTATOR CUFF REPAIR Right     There were no vitals filed for this visit.      Subjective Assessment - 11/08/15 1354    Subjective Pt reported she had been working with spirometer and breathing exercises, but has not noticed a big change yet.    Currently in Pain? No/denies               ADULT SLP  TREATMENT - 11/08/15 0001      General Information   Behavior/Cognition Alert;Cooperative;Pleasant mood     Treatment Provided   Treatment provided Cognitive-Linquistic     Pain Assessment   Pain Assessment No/denies pain     Cognitive-Linquistic Treatment   Treatment focused on Voice   Skilled Treatment Voice treatment: Pt completed neck stretches and shoulder shrugs x10 each w/min cues. Abdominal breath: Pt performed abdominal breathing exercises (abdominal breathing x2 minutes w/min-mod cues; sustained /s/ - max time 10 seconds; pulse /s/ w/mod cues). Pitch range exercises and Intensity: Pt performed pitch range exercises x10 each (modulating high to low) w/mod cues. Pt also demonstrated good pitch control and good vocal quality with pitch exercises in Visispitch games. Pt performed ten high intensity /ah/s w/mod cues. Pt was able to reach intensity of 78 dB while still maintaining good vocal quality with no strain. Pt able to hold intensity for max of 15 seconds.      Assessment / Recommendations / Plan   Plan Continue with current plan of care     Progression Toward Goals   Progression toward goals Progressing toward goals          SLP Education - 11/08/15 1355    Education provided Yes   Education Details  Voice therapy exercises   Person(s) Educated Patient   Methods Explanation;Demonstration   Comprehension Verbalized understanding;Returned demonstration            SLP Long Term Goals - 11/03/15 1532      SLP LONG TERM GOAL #1   Title The patient will demonstrate independent understanding of vocal hygiene concepts and neck, shoulder, lingual stretching exercises.   Time 8   Period Weeks   Status New     SLP LONG TERM GOAL #2   Title The patient will be independent for abdominal breathing and breath support exercises.   Time 8   Period Weeks   Status New     SLP LONG TERM GOAL #3   Title The patient will maximize voice quality and loudness using breath  support for sustained vowel production, pitch glides, and hierarchal speech drill.   Time 8   Period Weeks   Status New     SLP LONG TERM GOAL #4   Title The patient will maximize voice quality and loudness using breath support for paragraph length recitation with 80% accuracy.   Time 8   Period Weeks          Plan - 11/08/15 1356    Clinical Impression Statement Ashley Bray is demonstrating some improvement in breath support when given mod cues to decrease shoulder raising when inhaling. Pt was able to produce a good quality voice in pitch and intensity exercises with mod-max cues from clinician to reduce shoulder, neck tension. Pt will continue to benefit from skilled intervention to improve breath support, intensity and pitch range without strain.   Speech Therapy Frequency 2x / week   Duration Other (comment)  8 weeks   Treatment/Interventions SLP instruction and feedback;Compensatory strategies;Patient/family education   Potential to Achieve Goals Good   Potential Considerations Ability to learn/carryover information;Cooperation/participation level;Previous level of function;Family/community support   SLP Home Exercise Plan Breath support exercises, intensity and pitch exercises   Consulted and Agree with Plan of Care Patient      Patient will benefit from skilled therapeutic intervention in order to improve the following deficits and impairments:   Dysphonia    Problem List Patient Active Problem List   Diagnosis Date Noted  . B12 deficiency 06/27/2015  . Pneumonitis 05/16/2015  . PNA (pneumonia) 05/16/2015  . Cervical stenosis of spinal canal 03/16/2015  . Cervical spinal stenosis 03/16/2015  . Acute URI 03/10/2015  . URI, acute 02/16/2015  . Type 2 diabetes mellitus ([place]) 12/25/2014  . Neuropathy involving both lower extremities ([place]) 09/28/2014  . Upper airway cough syndrome 09/16/2014  . 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 Lung consolidation ([place]) 05/25/2014   . Cough 05/25/2014  . Breast cancer in situ 03/23/2014  . Rheumatoid arthritis involving multiple joints ([place]) 03/23/2014  . HLD (hyperlipidemia) 05/04/2013  . BP (high blood pressure) 05/04/2013    [place],Janesia Joswick 11/08/2015, 4:16 PM  [place] MAIN [facility] SERVICES [address] [place], [place], [address] Phone: [phone]   Fax:  [phone]   Name: Ashley Bray MRN: [id number] Date of Birth: June 24, 1940

## 2015-11-10 ENCOUNTER — Ambulatory Visit: Payer: Medicare Other | Admitting: Speech Pathology

## 2015-11-10 ENCOUNTER — Encounter: Payer: Self-pay | Admitting: Speech Pathology

## 2015-11-10 DIAGNOSIS — R490 Dysphonia: Secondary | ICD-10-CM

## 2015-11-10 NOTE — Therapy (Signed)
[place] MAIN [facility] SERVICES [address] [place], [place], [address] Phone: [phone]   Fax:  [phone]  Speech Language Pathology Treatment  Patient Details  Name: Ashley Bray MRN: [id number] Date of Birth: October 02, 1940 Referring Provider: Dr. Tami Ribas  Encounter Date: 11/10/2015      End of Session - 11/10/15 1450    Visit Number 3   Number of Visits 17   Date for SLP Re-Evaluation 01/06/16   SLP Start Time 1300   SLP Stop Time  [id number]   SLP Time Calculation (min) 55 min   Activity Tolerance Patient tolerated treatment well      Past Medical History:  Diagnosis Date  . Breast CA ([place])    left  . 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 Diabetes ([place])   . HLD (hyperlipidemia)   . Hypertension    NO MEDS  . Pneumonia    SMALL SPOT ON LUNG, HX RADIATION PNEUMONITIS   . PONV (postoperative nausea and vomiting)   . RA (rheumatoid arthritis) ([place])   . Radiation pneumonitis [facility])     Past Surgical History:  Procedure Laterality Date  . ABDOMINAL HYSTERECTOMY  1991  . ANTERIOR CERVICAL DECOMP/DISCECTOMY FUSION N/A 03/16/2015   Procedure: ANTERIOR  CERVICAL DECOMPRESSION FUSION CERVICAL TWO-THREE,CERVICAL THREE-FOUR WITH REMOVAL OF HARDWARE AT CERVICAL FOUR-FIVE.;  Surgeon: Earnie Larsson, MD;  Location: [place] NEURO ORS;  Service: Neurosurgery;  Laterality: N/A;  . APPENDECTOMY    . BREAST BIOPSY Left 08/31/2013   positive  . BREAST LUMPECTOMY Left 10/06/2013 & 11/03/2013  . CARPAL TUNNEL RELEASE Bilateral   . neck fusion    . REPLACEMENT TOTAL KNEE BILATERAL     x2  . ROTATOR CUFF REPAIR Right     There were no vitals filed for this visit.      Subjective Assessment - 11/10/15 1449    Subjective Patient reports little change yet.   Currently in Pain? No/denies               ADULT SLP TREATMENT - 11/10/15 0001      General Information   Behavior/Cognition  Alert;Cooperative;Pleasant mood     Treatment Provided   Treatment provided Cognitive-Linquistic     Pain Assessment   Pain Assessment No/denies pain     Cognitive-Linquistic Treatment   Treatment focused on Voice   Skilled Treatment The patient was provided with written and verbal teaching regarding vocal hygiene, specifically chronic throat clearing.  She reports that she is trying to clear her throat less.    The patient was provided with written and verbal teaching regarding neck and shoulder stretches exercises to promote relaxed phonation. The patient was provided with written and verbal teaching for supplement vocal tract relaxation exercises (straw phonation and lip trills with pitch change).  The patient was provided with written and verbal teaching regarding breath support exercises.  The patient was provided with verbal and written in pitch glides.  Patient able to complete fundamental frequency games with the Visi Pitch.  Patient instructed in relaxed phonation / oral resonance. Maintains oral resonance in recitation.       Assessment / Recommendations / Plan   Plan Continue with current plan of care     Progression Toward Goals   Progression toward goals Progressing toward goals          SLP Education - 11/10/15 1450    Education provided Yes  Education Details Extension of voice therapy exercises   Person(s) Educated Patient   Methods Explanation   Comprehension Verbalized understanding            SLP Long Term Goals - 11/03/15 1532      SLP LONG TERM GOAL #1   Title The patient will demonstrate independent understanding of vocal hygiene concepts and neck, shoulder, lingual stretching exercises.   Time 8   Period Weeks   Status New     SLP LONG TERM GOAL #2   Title The patient will be independent for abdominal breathing and breath support exercises.   Time 8   Period Weeks   Status New     SLP LONG TERM GOAL #3   Title The patient will maximize voice  quality and loudness using breath support for sustained vowel production, pitch glides, and hierarchal speech drill.   Time 8   Period Weeks   Status New     SLP LONG TERM GOAL #4   Title The patient will maximize voice quality and loudness using breath support for paragraph length recitation with 80% accuracy.   Time 8   Period Weeks          Plan - 11/10/15 1451    Clinical Impression Statement  Patient able to improve vocal quality with nasality to improve oral resonance and loudness to decrease laryngeal strain.  She is demonstrating improved pitch control in bio-feedback fundamental frequency tasks with the Visi Pitch.   Speech Therapy Frequency 2x / week   Duration Other (comment)   Treatment/Interventions SLP instruction and feedback;Compensatory strategies;Patient/family education;Other (comment)  Voice therapy   Potential to Achieve Goals Good   Potential Considerations Ability to learn/carryover information;Cooperation/participation level;Previous level of function;Family/community support   SLP Home Exercise Plan neck, shoulder, tongue, and throat stretches; supplemental vocal tract relaxation exercises;  breath support exercises; pitch control exercises; relaxed phonation / oral resonance     Consulted and Agree with Plan of Care Patient      Patient will benefit from skilled therapeutic intervention in order to improve the following deficits and impairments:   Dysphonia    Problem List Patient Active Problem List   Diagnosis Date Noted  . B12 deficiency 06/27/2015  . Pneumonitis 05/16/2015  . PNA (pneumonia) 05/16/2015  . Cervical stenosis of spinal canal 03/16/2015  . Cervical spinal stenosis 03/16/2015  . Acute URI 03/10/2015  . URI, acute 02/16/2015  . Type 2 diabetes mellitus ([place]) 12/25/2014  . 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 Upper airway cough syndrome 09/16/2014  . Radiation pneumonitis ([place]) 06/10/2014  . Lung  consolidation ([place]) 05/25/2014  . Cough 05/25/2014  . Breast cancer in situ 03/23/2014  . Rheumatoid arthritis involving multiple joints ([place]) 03/23/2014  . HLD (hyperlipidemia) 05/04/2013  . BP (high blood pressure) 05/04/2013   Leroy Sea, MS/CCC- SLP  Lou Miner 11/10/2015, 2:52 PM  [place] MAIN [facility] SERVICES [address] [place], [place], [address] Phone: [phone]   Fax:  [phone]   Name: Ashley Bray MRN: [id number] Date of Birth: 20-Jul-1940

## 2015-11-15 ENCOUNTER — Ambulatory Visit: Payer: Medicare Other | Admitting: Speech Pathology

## 2015-11-15 DIAGNOSIS — R490 Dysphonia: Secondary | ICD-10-CM

## 2015-11-16 ENCOUNTER — Encounter: Payer: Self-pay | Admitting: Speech Pathology

## 2015-11-16 NOTE — Therapy (Signed)
[place] MAIN [facility] SERVICES [address] [place], [place], [address] Phone: [phone]   Fax:  [phone]  Speech Language Pathology Treatment  Patient Details  Name: Ashley Bray MRN: [id number] Date of Birth: 11-17-1940 Referring Provider: Dr. Tami Ribas  Encounter Date: 11/15/2015      End of Session - 11/16/15 0918    Visit Number 4   Number of Visits 17   Date for SLP Re-Evaluation 01/06/16   SLP Start Time 1300   SLP Stop Time  [id number]   SLP Time Calculation (min) 55 min      Past Medical History:  Diagnosis Date  . Breast CA ([place])    left  . 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 Diabetes ([place])   . HLD (hyperlipidemia)   . Hypertension    NO MEDS  . Pneumonia    SMALL SPOT ON LUNG, HX RADIATION PNEUMONITIS   . PONV (postoperative nausea and vomiting)   . RA (rheumatoid arthritis) ([place])   . Radiation pneumonitis [facility])     Past Surgical History:  Procedure Laterality Date  . ABDOMINAL HYSTERECTOMY  1991  . ANTERIOR CERVICAL DECOMP/DISCECTOMY FUSION N/A 03/16/2015   Procedure: ANTERIOR  CERVICAL DECOMPRESSION FUSION CERVICAL TWO-THREE,CERVICAL THREE-FOUR WITH REMOVAL OF HARDWARE AT CERVICAL FOUR-FIVE.;  Surgeon: Earnie Larsson, MD;  Location: [place] NEURO ORS;  Service: Neurosurgery;  Laterality: N/A;  . APPENDECTOMY    . BREAST BIOPSY Left 08/31/2013   positive  . BREAST LUMPECTOMY Left 10/06/2013 & 11/03/2013  . CARPAL TUNNEL RELEASE Bilateral   . neck fusion    . REPLACEMENT TOTAL KNEE BILATERAL     x2  . ROTATOR CUFF REPAIR Right     There were no vitals filed for this visit.      Subjective Assessment - 11/16/15 0917    Subjective Patient feels her voice is "a little better"   Currently in Pain? No/denies               ADULT SLP TREATMENT - 11/16/15 0001      General Information   Behavior/Cognition Alert;Cooperative;Pleasant mood     Treatment  Provided   Treatment provided Cognitive-Linquistic     Pain Assessment   Pain Assessment No/denies pain     Cognitive-Linquistic Treatment   Treatment focused on Voice   Skilled Treatment The patient was provided with written and verbal teaching regarding vocal hygiene, specifically chronic throat clearing.  She reports that she is trying to clear her throat less.    The patient was provided with written and verbal teaching regarding neck, shoulder, and tongue stretches exercises to promote relaxed phonation. The patient was provided with written and verbal teaching for supplement vocal tract relaxation exercises (straw phonation and lip trills with pitch change).  The patient was provided with written and verbal teaching regarding breath support exercises.  The patient was provided with verbal and written in pitch glides.  Patient able to complete fundamental frequency games with the Visi Pitch.  Patient instructed in relaxed phonation / oral resonance. Maintains oral resonance in recitation.     Assessment / Recommendations / Plan   Plan Continue with current plan of care     Progression Toward Goals   Progression toward goals Progressing toward goals          SLP Education - 11/16/15 0918    Education provided Yes   Education Details nasality to  aid oral resonance   Person(s) Educated Patient   Methods Explanation   Comprehension Verbalized understanding            SLP Long Term Goals - 11/03/15 1532      SLP LONG TERM GOAL #1   Title The patient will demonstrate independent understanding of vocal hygiene concepts and neck, shoulder, lingual stretching exercises.   Time 8   Period Weeks   Status New     SLP LONG TERM GOAL #2   Title The patient will be independent for abdominal breathing and breath support exercises.   Time 8   Period Weeks   Status New     SLP LONG TERM GOAL #3   Title The patient will maximize voice quality and loudness using breath support for  sustained vowel production, pitch glides, and hierarchal speech drill.   Time 8   Period Weeks   Status New     SLP LONG TERM GOAL #4   Title The patient will maximize voice quality and loudness using breath support for paragraph length recitation with 80% accuracy.   Time 8   Period Weeks          Plan - 11/16/15 0919    Clinical Impression Statement  Patient able to improve vocal quality with nasality to improve oral resonance and loudness to decrease laryngeal strain.  The patient tends to recruit excessive extrinsic laryngeal muscle contraction when attempting to change vocal habits.  She is demonstrating improved pitch control in bio-feedback fundamental frequency tasks with the Visi Pitch.   Speech Therapy Frequency 2x / week   Duration Other (comment)   Treatment/Interventions SLP instruction and feedback;Compensatory strategies;Patient/family education;Other (comment)  Voice therapy   Potential to Achieve Goals Good   Potential Considerations Ability to learn/carryover information;Cooperation/participation level;Previous level of function;Family/community support   SLP Home Exercise Plan neck, shoulder, tongue, and tongue stretches; supplemental vocal tract relaxation exercises;  breath support exercises; pitch control exercises; relaxed phonation / oral resonance     Consulted and Agree with Plan of Care Patient      Patient will benefit from skilled therapeutic intervention in order to improve the following deficits and impairments:   Dysphonia    Problem List Patient Active Problem List   Diagnosis Date Noted  . B12 deficiency 06/27/2015  . Pneumonitis 05/16/2015  . PNA (pneumonia) 05/16/2015  . Cervical stenosis of spinal canal 03/16/2015  . Cervical spinal stenosis 03/16/2015  . Acute URI 03/10/2015  . URI, acute 02/16/2015  . Type 2 diabetes mellitus ([place]) 12/25/2014  . Neuropathy involving both lower extremities ([place]) 09/28/2014  . Upper airway cough  syndrome 09/16/2014  . Radiation pneumonitis ([place]) 06/10/2014  . Lung consolidation ([place]) 05/25/2014  . Cough 05/25/2014  . Breast cancer in situ 03/23/2014  . Rheumatoid arthritis involving multiple joints ([place]) 03/23/2014  . HLD (hyperlipidemia) 05/04/2013  . BP (high blood pressure) 05/04/2013   Leroy Sea, MS/CCC- SLP  Lou Miner 11/16/2015, 9:20 AM  [place] MAIN [facility] SERVICES [address] [place], [place], [address] Phone: [phone]   Fax:  [phone]   Name: Ashley Bray MRN: [id number] Date of Birth: 05-30-1940

## 2015-11-17 ENCOUNTER — Encounter: Payer: Self-pay | Admitting: Speech Pathology

## 2015-11-17 ENCOUNTER — Ambulatory Visit: Payer: Medicare Other | Admitting: Speech Pathology

## 2015-11-17 DIAGNOSIS — R490 Dysphonia: Secondary | ICD-10-CM

## 2015-11-17 NOTE — Therapy (Signed)
[place] MAIN [facility] SERVICES [address] [place], [place], [address] Phone: [phone]   Fax:  [phone]  Speech Language Pathology Treatment  Patient Details  Name: Ashley Bray MRN: [id number] Date of Birth: 1940-06-19 Referring Provider: Dr. Tami Ribas  Encounter Date: 11/17/2015      End of Session - 11/17/15 1356    Visit Number 5   Number of Visits 17   Date for SLP Re-Evaluation 01/06/16   SLP Start Time 1250   SLP Stop Time  [id number]   SLP Time Calculation (min) 55 min      Past Medical History:  Diagnosis Date  . Breast CA ([place])    left  . 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 Diabetes ([place])   . HLD (hyperlipidemia)   . Hypertension    NO MEDS  . Pneumonia    SMALL SPOT ON LUNG, HX RADIATION PNEUMONITIS   . PONV (postoperative nausea and vomiting)   . RA (rheumatoid arthritis) ([place])   . Radiation pneumonitis [facility])     Past Surgical History:  Procedure Laterality Date  . ABDOMINAL HYSTERECTOMY  1991  . ANTERIOR CERVICAL DECOMP/DISCECTOMY FUSION N/A 03/16/2015   Procedure: ANTERIOR  CERVICAL DECOMPRESSION FUSION CERVICAL TWO-THREE,CERVICAL THREE-FOUR WITH REMOVAL OF HARDWARE AT CERVICAL FOUR-FIVE.;  Surgeon: Earnie Larsson, MD;  Location: [place] NEURO ORS;  Service: Neurosurgery;  Laterality: N/A;  . APPENDECTOMY    . BREAST BIOPSY Left 08/31/2013   positive  . BREAST LUMPECTOMY Left 10/06/2013 & 11/03/2013  . CARPAL TUNNEL RELEASE Bilateral   . neck fusion    . REPLACEMENT TOTAL KNEE BILATERAL     x2  . ROTATOR CUFF REPAIR Right     There were no vitals filed for this visit.      Subjective Assessment - 11/17/15 1355    Subjective Patient feels her voice is "a little better"   Currently in Pain? No/denies               ADULT SLP TREATMENT - 11/17/15 0001      General Information   Behavior/Cognition Alert;Cooperative;Pleasant mood     Treatment  Provided   Treatment provided Cognitive-Linquistic     Pain Assessment   Pain Assessment No/denies pain     Cognitive-Linquistic Treatment   Treatment focused on Voice   Skilled Treatment The patient was provided with written and verbal teaching regarding vocal hygiene, specifically chronic throat clearing.  She reports that she is trying to clear her throat less.    The patient was provided with written and verbal teaching regarding neck, shoulder, and tongue stretches exercises to promote relaxed phonation. The patient was provided with written and verbal teaching for supplement vocal tract relaxation exercises (straw phonation and lip trills with pitch change).  The patient was provided with written and verbal teaching regarding breath support exercises.  The patient was provided with verbal and written in pitch glides.  Patient able to complete fundamental frequency games with the Visi Pitch with much greater ease.  Patient instructed in relaxed phonation / oral resonance. Maintains oral resonance in "n" words and sentences.       Assessment / Recommendations / Plan   Plan Continue with current plan of care     Progression Toward Goals   Progression toward goals Progressing toward goals          SLP Education - 11/17/15 1355  Education provided Yes   Education Details need to decrease extrinsic laryngeal muscle    Person(s) Educated Patient   Methods Explanation   Comprehension Verbalized understanding;Need further instruction            SLP Long Term Goals - 11/03/15 1532      SLP LONG TERM GOAL #1   Title The patient will demonstrate independent understanding of vocal hygiene concepts and neck, shoulder, lingual stretching exercises.   Time 8   Period Weeks   Status New     SLP LONG TERM GOAL #2   Title The patient will be independent for abdominal breathing and breath support exercises.   Time 8   Period Weeks   Status New     SLP LONG TERM GOAL #3   Title The  patient will maximize voice quality and loudness using breath support for sustained vowel production, pitch glides, and hierarchal speech drill.   Time 8   Period Weeks   Status New     SLP LONG TERM GOAL #4   Title The patient will maximize voice quality and loudness using breath support for paragraph length recitation with 80% accuracy.   Time 8   Period Weeks          Plan - 11/17/15 1356    Clinical Impression Statement  Patient able to improve vocal quality with nasality to improve oral resonance and loudness to decrease laryngeal strain.  The patient tends to recruit excessive extrinsic laryngeal muscle contraction when attempting to change vocal habits.  She is demonstrating improved pitch control in bio-feedback fundamental frequency tasks with the Visi Pitch.   Speech Therapy Frequency 2x / week   Duration Other (comment)   Treatment/Interventions SLP instruction and feedback;Compensatory strategies;Patient/family education;Other (comment)  Voice therapy   Potential to Achieve Goals Good   Potential Considerations Ability to learn/carryover information;Cooperation/participation level;Previous level of function;Family/community support   SLP Home Exercise Plan neck, shoulder, tongue, and tongue stretches; supplemental vocal tract relaxation exercises;  breath support exercises; pitch control exercises; relaxed phonation / oral resonance     Consulted and Agree with Plan of Care Patient      Patient will benefit from skilled therapeutic intervention in order to improve the following deficits and impairments:   Dysphonia    Problem List Patient Active Problem List   Diagnosis Date Noted  . B12 deficiency 06/27/2015  . Pneumonitis 05/16/2015  . PNA (pneumonia) 05/16/2015  . Cervical stenosis of spinal canal 03/16/2015  . Cervical spinal stenosis 03/16/2015  . Acute URI 03/10/2015  . URI, acute 02/16/2015  . Type 2 diabetes mellitus ([place]) 12/25/2014  . Neuropathy  involving both lower extremities ([place]) 09/28/2014  . Upper airway cough syndrome 09/16/2014  . Radiation pneumonitis ([place]) 06/10/2014  . Lung consolidation ([place]) 05/25/2014  . Cough 05/25/2014  . Breast cancer in situ 03/23/2014  . Rheumatoid arthritis involving multiple joints ([place]) 03/23/2014  . HLD (hyperlipidemia) 05/04/2013  . BP (high blood pressure) 05/04/2013   Leroy Sea, MS/CCC- SLP  Lou Miner 11/17/2015, 1:57 PM  [place] MAIN [facility] SERVICES [address] [place], [place], [address] Phone: [phone]   Fax:  [phone]   Name: Ashley Bray MRN: [id number] Date of Birth: [birth year]

## 2015-11-22 ENCOUNTER — Ambulatory Visit: Payer: Medicare Other | Attending: Unknown Physician Specialty | Admitting: Speech Pathology

## 2015-11-22 ENCOUNTER — Encounter: Payer: Self-pay | Admitting: Speech Pathology

## 2015-11-22 DIAGNOSIS — R490 Dysphonia: Secondary | ICD-10-CM | POA: Insufficient documentation

## 2015-11-22 NOTE — Therapy (Signed)
[place] MAIN [facility] SERVICES [address] [place], [place], [address] Phone: [phone]   Fax:  [phone]  Speech Language Pathology Treatment  Patient Details  Name: Ashley Bray MRN: [id number] Date of Birth: Jan 23, 1941 Referring Provider: Dr. Tami Ribas  Encounter Date: 11/22/2015      End of Session - 11/22/15 1527    Visit Number 6   Number of Visits 17   Date for SLP Re-Evaluation 01/06/16   SLP Start Time 0900   SLP Stop Time  1000   SLP Time Calculation (min) 60 min   Activity Tolerance Patient tolerated treatment well      Past Medical History:  Diagnosis Date  . Breast CA ([employer])    left  . 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 Diabetes ([place])   . HLD (hyperlipidemia)   . Hypertension    NO MEDS  . Pneumonia    SMALL SPOT ON LUNG, HX RADIATION PNEUMONITIS   . PONV (postoperative nausea and vomiting)   . RA (rheumatoid arthritis) ([place])   . Radiation pneumonitis [place] [place])     Past Surgical History:  Procedure Laterality Date  . ABDOMINAL HYSTERECTOMY  1991  . ANTERIOR CERVICAL DECOMP/DISCECTOMY FUSION N/A 03/16/2015   Procedure: ANTERIOR  CERVICAL DECOMPRESSION FUSION CERVICAL TWO-THREE,CERVICAL THREE-FOUR WITH REMOVAL OF HARDWARE AT CERVICAL FOUR-FIVE.;  Surgeon: Earnie Larsson, MD;  Location: [place] [facility];  Service: Neurosurgery;  Laterality: N/A;  . APPENDECTOMY    . BREAST BIOPSY Left 08/31/2013   positive  . BREAST LUMPECTOMY Left 10/06/2013 & 11/03/2013  . CARPAL TUNNEL RELEASE Bilateral   . neck fusion    . REPLACEMENT TOTAL KNEE BILATERAL     x2  . ROTATOR CUFF REPAIR Right     There were no vitals filed for this visit.      Subjective Assessment - 11/22/15 1526    Subjective Patient feels her voice is "a little better"   Currently in Pain? No/denies               ADULT SLP TREATMENT - 11/22/15 0001      General Information   Behavior/Cognition  Alert;Cooperative;Pleasant mood     Treatment Provided   Treatment provided Cognitive-Linquistic     Pain Assessment   Pain Assessment No/denies pain     Cognitive-Linquistic Treatment   Treatment focused on Voice   Skilled Treatment The patient was provided with written and verbal teaching regarding vocal hygiene, specifically chronic throat clearing.  She reports that she is trying to clear her throat less.    The patient was provided with written and verbal teaching regarding neck, shoulder, and tongue stretches exercises to promote relaxed phonation. The patient was provided with written and verbal teaching for supplement vocal tract relaxation exercises (straw phonation and lip trills with pitch change).  The patient was provided with written and verbal teaching regarding breath support exercises.  The patient was provided with verbal and written in pitch glides.  Patient able to complete fundamental frequency games with the Visi Pitch with much greater ease.  Patient instructed in relaxed phonation / oral resonance. Maintains oral resonance in "n" words and sentences.       Assessment / Recommendations / Plan   Plan Continue with current plan of care     Progression Toward Goals   Progression toward goals Progressing toward goals  SLP Education - 11/22/15 1527    Education provided Yes   Education Details use visual and tactile feed back to reduce tension in extrinsic laryngeal muscles   Person(s) Educated Patient   Methods Explanation   Comprehension Verbalized understanding;Need further instruction            SLP Long Term Goals - 11/03/15 1532      SLP LONG TERM GOAL #1   Title The patient will demonstrate independent understanding of vocal hygiene concepts and neck, shoulder, lingual stretching exercises.   Time 8   Period Weeks   Status New     SLP LONG TERM GOAL #2   Title The patient will be independent for abdominal breathing and breath support  exercises.   Time 8   Period Weeks   Status New     SLP LONG TERM GOAL #3   Title The patient will maximize voice quality and loudness using breath support for sustained vowel production, pitch glides, and hierarchal speech drill.   Time 8   Period Weeks   Status New     SLP LONG TERM GOAL #4   Title The patient will maximize voice quality and loudness using breath support for paragraph length recitation with 80% accuracy.   Time 8   Period Weeks          Plan - 11/22/15 1528    Clinical Impression Statement  Patient able to improve vocal quality with nasality to improve oral resonance and loudness to decrease laryngeal strain.  The patient continues to recruit excessive extrinsic laryngeal muscle contraction when attempting to change vocal habits.  Patient demonstrates minimal laryngeal elevation with pitch glides, but normal excursion with swallowing.  She is demonstrating improved pitch control in bio-feedback fundamental frequency tasks with the Visi Pitch.   Speech Therapy Frequency 2x / week   Duration Other (comment)   Treatment/Interventions SLP instruction and feedback;Compensatory strategies;Patient/family education;Other (comment)  Voice therapy   Potential to Achieve Goals Good   Potential Considerations Ability to learn/carryover information;Cooperation/participation level;Previous level of function;Family/community support   SLP Home Exercise Plan neck, shoulder, tongue, and tongue stretches; supplemental vocal tract relaxation exercises;  breath support exercises; pitch control exercises; relaxed phonation / oral resonance     Consulted and Agree with Plan of Care Patient      Patient will benefit from skilled therapeutic intervention in order to improve the following deficits and impairments:   Dysphonia    Problem List Patient Active Problem List   Diagnosis Date Noted  . B12 deficiency 06/27/2015  . Pneumonitis 05/16/2015  . PNA (pneumonia) 05/16/2015  .  Cervical stenosis of spinal canal 03/16/2015  . Cervical spinal stenosis 03/16/2015  . Acute URI 03/10/2015  . URI, acute 02/16/2015  . Type 2 diabetes mellitus ([place]) 12/25/2014  . Neuropathy involving both lower extremities ([place]) 09/28/2014  . Upper airway cough syndrome 09/16/2014  . 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 Lung consolidation ([place]) 05/25/2014  . Cough 05/25/2014  . Breast cancer in situ 03/23/2014  . Rheumatoid arthritis involving multiple joints ([place]) 03/23/2014  . HLD (hyperlipidemia) 05/04/2013  . BP (high blood pressure) 05/04/2013   Leroy Sea, MS/CCC- SLP  Lou Miner 11/22/2015, 3:33 PM  [place] MAIN [facility] SERVICES [address] [place], [place], [address] Phone: [phone]   Fax:  [phone]   Name: Ashley Bray MRN: [id number] Date of Birth: 08/27/1940

## 2015-11-25 ENCOUNTER — Encounter: Payer: Self-pay | Admitting: Speech Pathology

## 2015-11-25 ENCOUNTER — Ambulatory Visit: Payer: Medicare Other | Admitting: Speech Pathology

## 2015-11-25 DIAGNOSIS — R490 Dysphonia: Secondary | ICD-10-CM | POA: Diagnosis not present

## 2015-11-25 NOTE — Therapy (Signed)
[place] MAIN [facility] SERVICES [address] [place], [place], [address] Phone: [phone]   Fax:  [phone]  Speech Language Pathology Treatment  Patient Details  Name: Ashley Bray MRN: [id number] Date of Birth: Feb 24, 1941 Referring Provider: Dr. Tami Ribas  Encounter Date: 11/25/2015      End of Session - 11/25/15 1022    Visit Number 7   Number of Visits 17   Date for SLP Re-Evaluation 01/06/16   SLP Start Time 0852   SLP Stop Time  0947   SLP Time Calculation (min) 55 min      Past Medical History:  Diagnosis Date  . Breast CA ([place])    left  . 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 Diabetes ([place])   . HLD (hyperlipidemia)   . Hypertension    NO MEDS  . Pneumonia    SMALL SPOT ON LUNG, HX RADIATION PNEUMONITIS   . PONV (postoperative nausea and vomiting)   . RA (rheumatoid arthritis) ([place])   . Radiation pneumonitis [facility])     Past Surgical History:  Procedure Laterality Date  . ABDOMINAL HYSTERECTOMY  1991  . ANTERIOR CERVICAL DECOMP/DISCECTOMY FUSION N/A 03/16/2015   Procedure: ANTERIOR  CERVICAL DECOMPRESSION FUSION CERVICAL TWO-THREE,CERVICAL THREE-FOUR WITH REMOVAL OF HARDWARE AT CERVICAL FOUR-FIVE.;  Surgeon: Earnie Larsson, MD;  Location: [place] [facility];  Service: Neurosurgery;  Laterality: N/A;  . APPENDECTOMY    . BREAST BIOPSY Left 08/31/2013   positive  . BREAST LUMPECTOMY Left 10/06/2013 & 11/03/2013  . CARPAL TUNNEL RELEASE Bilateral   . neck fusion    . REPLACEMENT TOTAL KNEE BILATERAL     x2  . ROTATOR CUFF REPAIR Right     There were no vitals filed for this visit.      Subjective Assessment - 11/25/15 1020    Subjective Patient feels her voice is "a little better"   Currently in Pain? No/denies               ADULT SLP TREATMENT - 11/25/15 0001      General Information   Behavior/Cognition Alert;Cooperative;Pleasant mood     Treatment  Provided   Treatment provided Cognitive-Linquistic     Pain Assessment   Pain Assessment No/denies pain     Cognitive-Linquistic Treatment   Treatment focused on Voice   Skilled Treatment The patient was provided with written and verbal teaching regarding vocal hygiene, specifically chronic throat clearing.  She reports that she is trying to clear her throat less.    The patient was provided with written and verbal teaching regarding neck, shoulder, and tongue stretches exercises to promote relaxed phonation. The patient was provided with written and verbal teaching for supplement vocal tract relaxation exercises (straw phonation and lip trills with pitch change).  The patient was provided with written and verbal teaching regarding breath support exercises.  The patient was provided with verbal and written in pitch glides.  Patient able to complete fundamental frequency games with the Visi Pitch with much greater ease.  Patient instructed in relaxed phonation / oral resonance. Maintains oral resonance in "n" words and sentences.  Patient instructed in use of intonation patterns to promote oral resonance.     Assessment / Recommendations / Plan   Plan Continue with current plan of care     Progression Toward Goals   Progression toward goals Progressing toward goals  SLP Education - 11/25/15 1020    Education provided Yes   Education Details Use visual and tactile feedback to reduce extrinsic laryngeal muscle tension   Person(s) Educated Patient   Methods Explanation   Comprehension Verbalized understanding            SLP Long Term Goals - 11/03/15 1532      SLP LONG TERM GOAL #1   Title The patient will demonstrate independent understanding of vocal hygiene concepts and neck, shoulder, lingual stretching exercises.   Time 8   Period Weeks   Status New     SLP LONG TERM GOAL #2   Title The patient will be independent for abdominal breathing and breath support  exercises.   Time 8   Period Weeks   Status New     SLP LONG TERM GOAL #3   Title The patient will maximize voice quality and loudness using breath support for sustained vowel production, pitch glides, and hierarchal speech drill.   Time 8   Period Weeks   Status New     SLP LONG TERM GOAL #4   Title The patient will maximize voice quality and loudness using breath support for paragraph length recitation with 80% accuracy.   Time 8   Period Weeks          Plan - 11/25/15 1022    Clinical Impression Statement  Patient able to improve vocal quality with nasality to improve oral resonance and loudness to decrease laryngeal strain.  The patient continues to recruit excessive extrinsic laryngeal muscle contraction when attempting to change vocal habits; this is decreasing but continues to be problematic.  Patient demonstrates increased laryngeal movement with pitch glides.  She is demonstrating improved pitch control in bio-feedback fundamental frequency tasks with the Visi Pitch.   Speech Therapy Frequency 2x / week   Duration Other (comment)   Treatment/Interventions SLP instruction and feedback;Compensatory strategies;Patient/family education;Other (comment)   Potential to Achieve Goals Good   Potential Considerations Ability to learn/carryover information;Cooperation/participation level;Previous level of function;Family/community support   SLP Home Exercise Plan neck, shoulder, tongue, and tongue stretches; supplemental vocal tract relaxation exercises;  breath support exercises; pitch control exercises; relaxed phonation / oral resonance     Consulted and Agree with Plan of Care Patient      Patient will benefit from skilled therapeutic intervention in order to improve the following deficits and impairments:   Dysphonia    Problem List Patient Active Problem List   Diagnosis Date Noted  . B12 deficiency 06/27/2015  . Pneumonitis 05/16/2015  . PNA (pneumonia) 05/16/2015  .  Cervical stenosis of spinal canal 03/16/2015  . Cervical spinal stenosis 03/16/2015  . Acute URI 03/10/2015  . URI, acute 02/16/2015  . Type 2 diabetes mellitus ([place]) 12/25/2014  . Neuropathy involving both lower extremities ([place]) 09/28/2014  . Upper airway cough syndrome 09/16/2014  . 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 Lung consolidation ([place]) 05/25/2014  . Cough 05/25/2014  . Breast cancer in situ 03/23/2014  . Rheumatoid arthritis involving multiple joints ([place]) 03/23/2014  . HLD (hyperlipidemia) 05/04/2013  . BP (high blood pressure) 05/04/2013   Leroy Sea, MS/CCC- SLP  Lou Miner 11/25/2015, 10:23 AM  [place] MAIN [facility] SERVICES [address] [place], [place], [address] Phone: [phone]   Fax:  [phone]   Name: ATIA KOLANOWSKI MRN: [id number] Date of Birth: 1940-03-24

## 2015-11-29 ENCOUNTER — Ambulatory Visit: Payer: Medicare Other | Admitting: Speech Pathology

## 2015-11-29 ENCOUNTER — Encounter: Payer: Self-pay | Admitting: Speech Pathology

## 2015-11-29 DIAGNOSIS — R490 Dysphonia: Secondary | ICD-10-CM

## 2015-11-29 NOTE — Therapy (Signed)
[place] MAIN [facility] SERVICES [address] [place], [place], [address] Phone: [phone]   Fax:  [phone]  Speech Language Pathology Treatment  Patient Details  Name: Ashley Bray MRN: [id number] Date of Birth: 1941/03/04 Referring Provider: Dr. Tami Ribas  Encounter Date: 11/29/2015      End of Session - 11/29/15 1504    Visit Number 8   Number of Visits 17   Date for SLP Re-Evaluation 01/06/16      Past Medical History:  Diagnosis Date  . Breast CA ([place])    left  . 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 Diabetes ([place])   . HLD (hyperlipidemia)   . Hypertension    NO MEDS  . Pneumonia    SMALL SPOT ON LUNG, HX RADIATION PNEUMONITIS   . PONV (postoperative nausea and vomiting)   . RA (rheumatoid arthritis) ([place])   . Radiation pneumonitis [facility])     Past Surgical History:  Procedure Laterality Date  . ABDOMINAL HYSTERECTOMY  1991  . ANTERIOR CERVICAL DECOMP/DISCECTOMY FUSION N/A 03/16/2015   Procedure: ANTERIOR  CERVICAL DECOMPRESSION FUSION CERVICAL TWO-THREE,CERVICAL THREE-FOUR WITH REMOVAL OF HARDWARE AT CERVICAL FOUR-FIVE.;  Surgeon: Earnie Larsson, MD;  Location: [place] [facility];  Service: Neurosurgery;  Laterality: N/A;  . APPENDECTOMY    . BREAST BIOPSY Left 08/31/2013   positive  . BREAST LUMPECTOMY Left 10/06/2013 & 11/03/2013  . CARPAL TUNNEL RELEASE Bilateral   . neck fusion    . REPLACEMENT TOTAL KNEE BILATERAL     x2  . ROTATOR CUFF REPAIR Right     There were no vitals filed for this visit.      Subjective Assessment - 11/29/15 1504    Subjective Patient agrees that her voice is slowly improving   Currently in Pain? No/denies               ADULT SLP TREATMENT - 11/29/15 0001      General Information   Behavior/Cognition Alert;Cooperative;Pleasant mood     Treatment Provided   Treatment provided Cognitive-Linquistic     Pain Assessment   Pain  Assessment No/denies pain     Cognitive-Linquistic Treatment   Treatment focused on Voice   Skilled Treatment The patient was provided with written and verbal teaching regarding vocal hygiene, specifically chronic throat clearing.  She reports that she is trying to clear her throat less.    The patient was provided with written and verbal teaching regarding neck, shoulder, and tongue stretches exercises to promote relaxed phonation. The patient was provided with written and verbal teaching for supplement vocal tract relaxation exercises (straw phonation and lip trills with pitch change).  The patient was provided with written and verbal teaching regarding breath support exercises.  The patient was provided with verbal and written in pitch glides.  Patient able to sustain vowel at 80 dB with clear vocal quality and automatic speech at 75 dB with clear vocal quality.  Patient able to complete fundamental frequency games with the Visi Pitch with much greater ease.  Patient instructed in relaxed phonation / oral resonance. Maintains oral resonance in sentences and conversation with 80% accuracy given occasional cues.       Assessment / Recommendations / Plan   Plan Continue with current plan of care     Progression Toward Goals   Progression toward goals Progressing toward goals          SLP  Education - 11/29/15 1504    Education provided Yes   Education Details Match pitch to piano notes, focusing on vocal quality   Person(s) Educated Patient   Methods Explanation   Comprehension Verbalized understanding            SLP Long Term Goals - 11/03/15 1532      SLP LONG TERM GOAL #1   Title The patient will demonstrate independent understanding of vocal hygiene concepts and neck, shoulder, lingual stretching exercises.   Time 8   Period Weeks   Status New     SLP LONG TERM GOAL #2   Title The patient will be independent for abdominal breathing and breath support exercises.   Time 8    Period Weeks   Status New     SLP LONG TERM GOAL #3   Title The patient will maximize voice quality and loudness using breath support for sustained vowel production, pitch glides, and hierarchal speech drill.   Time 8   Period Weeks   Status New     SLP LONG TERM GOAL #4   Title The patient will maximize voice quality and loudness using breath support for paragraph length recitation with 80% accuracy.   Time 8   Period Weeks          Plan - 11/29/15 1505    Clinical Impression Statement Patient able to improve vocal quality with nasality to improve oral resonance and loudness to decrease laryngeal strain.  The patient demonstrates less recruitment of excessive extrinsic laryngeal muscle contraction when attempting to change vocal habits.  Patient demonstrates increased laryngeal movement with pitch glides.  She is demonstrating improved pitch control in bio-feedback fundamental frequency tasks with the Visi Pitch.   Speech Therapy Frequency 2x / week   Duration Other (comment)   Treatment/Interventions SLP instruction and feedback;Compensatory strategies;Patient/family education;Other (comment)   Potential to Achieve Goals Good   Potential Considerations Ability to learn/carryover information;Cooperation/participation level;Previous level of function;Family/community support   SLP Home Exercise Plan neck, shoulder, tongue, and tongue stretches; supplemental vocal tract relaxation exercises;  breath support exercises; pitch control exercises; relaxed phonation / oral resonance     Consulted and Agree with Plan of Care Patient      Patient will benefit from skilled therapeutic intervention in order to improve the following deficits and impairments:   Dysphonia    Problem List Patient Active Problem List   Diagnosis Date Noted  . B12 deficiency 06/27/2015  . Pneumonitis 05/16/2015  . PNA (pneumonia) 05/16/2015  . Cervical stenosis of spinal canal 03/16/2015  . Cervical spinal  stenosis 03/16/2015  . Acute URI 03/10/2015  . URI, acute 02/16/2015  . Type 2 diabetes mellitus ([place]) 12/25/2014  . Neuropathy involving both lower extremities ([place]) 09/28/2014  . Upper airway cough syndrome 09/16/2014  . 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 Lung consolidation ([place]) 05/25/2014  . Cough 05/25/2014  . Breast cancer in situ 03/23/2014  . Rheumatoid arthritis involving multiple joints ([place]) 03/23/2014  . HLD (hyperlipidemia) 05/04/2013  . BP (high blood pressure) 05/04/2013   Leroy Sea, MS/CCC- SLP  Lou Miner 11/29/2015, 3:06 PM  [place] MAIN [facility] SERVICES [address] [place], [place], [address] Phone: [phone]   Fax:  [phone]   Name: Ashley Bray MRN: [id number] Date of Birth: 10-06-1940

## 2015-12-01 ENCOUNTER — Encounter: Payer: Self-pay | Admitting: Speech Pathology

## 2015-12-01 ENCOUNTER — Ambulatory Visit: Payer: Medicare Other | Admitting: Speech Pathology

## 2015-12-01 DIAGNOSIS — R490 Dysphonia: Secondary | ICD-10-CM | POA: Diagnosis not present

## 2015-12-01 NOTE — Therapy (Signed)
[place] MAIN [facility] SERVICES [address] [place], [place], [address] Phone: [phone]   Fax:  [phone]  Speech Language Pathology Treatment  Patient Details  Name: Ashley Bray MRN: [id number] Date of Birth: April 25, 1940 Referring Provider: Dr. Tami Ribas  Encounter Date: 12/01/2015      End of Session - 12/01/15 1424    Visit Number 9   Number of Visits 17   Date for SLP Re-Evaluation 01/06/16   SLP Start Time 39   SLP Stop Time  1154   SLP Time Calculation (min) 54 min      Past Medical History:  Diagnosis Date  . Breast CA ([place])    left  . 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 Diabetes ([place])   . HLD (hyperlipidemia)   . Hypertension    NO MEDS  . Pneumonia    SMALL SPOT ON LUNG, HX RADIATION PNEUMONITIS   . PONV (postoperative nausea and vomiting)   . RA (rheumatoid arthritis) ([place])   . 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 ABDOMINAL HYSTERECTOMY  1991  . ANTERIOR CERVICAL DECOMP/DISCECTOMY FUSION N/A 03/16/2015   Procedure: ANTERIOR  CERVICAL DECOMPRESSION FUSION CERVICAL TWO-THREE,CERVICAL THREE-FOUR WITH REMOVAL OF HARDWARE AT CERVICAL FOUR-FIVE.;  Surgeon: Earnie Larsson, MD;  Location: [place] [facility];  Service: Neurosurgery;  Laterality: N/A;  . APPENDECTOMY    . BREAST BIOPSY Left 08/31/2013   positive  . BREAST LUMPECTOMY Left 10/06/2013 & 11/03/2013  . CARPAL TUNNEL RELEASE Bilateral   . neck fusion    . REPLACEMENT TOTAL KNEE BILATERAL     x2  . ROTATOR CUFF REPAIR Right     There were no vitals filed for this visit.      Subjective Assessment - 12/01/15 1424    Subjective Patient agrees that her voice is improving   Currently in Pain? No/denies               ADULT SLP TREATMENT - 12/01/15 0001      General Information   Behavior/Cognition Alert;Cooperative;Pleasant mood     Treatment  Provided   Treatment provided Cognitive-Linquistic     Pain Assessment   Pain Assessment No/denies pain     Cognitive-Linquistic Treatment   Treatment focused on Voice   Skilled Treatment The patient was provided with written and verbal teaching regarding vocal hygiene, specifically chronic throat clearing.  She reports that she is trying to clear her throat less.    The patient was provided with written and verbal teaching regarding neck, shoulder, and tongue stretches exercises to promote relaxed phonation. The patient was provided with written and verbal teaching for supplement vocal tract relaxation exercises (straw phonation and lip trills with pitch change).  The patient was provided with written and verbal teaching regarding breath support exercises.  The patient was provided with verbal and written in pitch glides.  Patient able to sustain vowel at 80 dB with clear vocal quality and automatic speech at 75 dB with clear vocal quality.  Patient able to complete fundamental frequency games with the Visi Pitch with much greater ease.  Patient instructed in relaxed phonation / oral resonance. Maintains oral resonance in sentences and conversation with 80% accuracy given occasional cues.  Patient instructed in modified vocal function exercises and given written instructions.     Assessment / Recommendations / Plan   Plan Continue  with current plan of care     Progression Toward Goals   Progression toward goals Progressing toward goals          SLP Education - 12/01/15 1424    Education provided Yes   Education Details vocal function exercises (modified)   Person(s) Educated Patient   Methods Explanation;Demonstration;Handout   Comprehension Verbalized understanding            SLP Long Term Goals - 11/03/15 1532      SLP LONG TERM GOAL #1   Title The patient will demonstrate independent understanding of vocal hygiene concepts and neck, shoulder, lingual stretching exercises.    Time 8   Period Weeks   Status New     SLP LONG TERM GOAL #2   Title The patient will be independent for abdominal breathing and breath support exercises.   Time 8   Period Weeks   Status New     SLP LONG TERM GOAL #3   Title The patient will maximize voice quality and loudness using breath support for sustained vowel production, pitch glides, and hierarchal speech drill.   Time 8   Period Weeks   Status New     SLP LONG TERM GOAL #4   Title The patient will maximize voice quality and loudness using breath support for paragraph length recitation with 80% accuracy.   Time 8   Period Weeks          Plan - 12/01/15 1425    Clinical Impression Statement Patient able to improve vocal quality with nasality to improve oral resonance and loudness to decrease laryngeal strain.  The patient demonstrates less recruitment of excessive extrinsic laryngeal muscle contraction when attempting to change vocal habits.  Patient demonstrates increased laryngeal movement with pitch glides.  She is demonstrating improved pitch control in bio-feedback fundamental frequency tasks with the Visi Pitch.   Speech Therapy Frequency 2x / week   Duration Other (comment)   Treatment/Interventions SLP instruction and feedback;Compensatory strategies;Patient/family education;Other (comment)  Voice therapy   Potential to Achieve Goals Good   Potential Considerations Ability to learn/carryover information;Cooperation/participation level;Previous level of function;Family/community support   SLP Home Exercise Plan neck, shoulder, tongue, and tongue stretches; supplemental vocal tract relaxation exercises;  breath support exercises; pitch control exercises; relaxed phonation / oral resonance; vocal function exercises (modified)     Consulted and Agree with Plan of Care Patient      Patient will benefit from skilled therapeutic intervention in order to improve the following deficits and impairments:    Dysphonia    Problem List Patient Active Problem List   Diagnosis Date Noted  . B12 deficiency 06/27/2015  . Pneumonitis 05/16/2015  . PNA (pneumonia) 05/16/2015  . Cervical stenosis of spinal canal 03/16/2015  . Cervical spinal stenosis 03/16/2015  . Acute URI 03/10/2015  . URI, acute 02/16/2015  . Type 2 diabetes mellitus ([place]) 12/25/2014  . Neuropathy involving both lower extremities ([place]) 09/28/2014  . Upper airway cough syndrome 09/16/2014  . 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 Lung consolidation ([place]) 05/25/2014  . Cough 05/25/2014  . Breast cancer in situ 03/23/2014  . Rheumatoid arthritis involving multiple joints ([place]) 03/23/2014  . HLD (hyperlipidemia) 05/04/2013  . BP (high blood pressure) 05/04/2013   Leroy Sea, MS/CCC- SLP  Lou Miner 12/01/2015, 2:26 PM  [place] MAIN [facility] SERVICES [address] [place], [place], [address] Phone: [phone]   Fax:  [phone]   Name: AMAIRANY RAYFORD  MRN: [id number] Date of Birth: 29-May-1940

## 2015-12-02 ENCOUNTER — Ambulatory Visit: Payer: Medicare Other | Admitting: Speech Pathology

## 2015-12-06 ENCOUNTER — Ambulatory Visit: Payer: Medicare Other | Admitting: Speech Pathology

## 2015-12-06 ENCOUNTER — Encounter: Payer: Self-pay | Admitting: Speech Pathology

## 2015-12-06 DIAGNOSIS — R490 Dysphonia: Secondary | ICD-10-CM | POA: Diagnosis not present

## 2015-12-06 NOTE — Therapy (Signed)
[place] MAIN [facility] SERVICES [place], [place], [address] Phone: [phone]   Fax:  [phone]  Speech Language Pathology Treatment/[place] Note  Patient Details  Name: Ashley Bray MRN: [id number] Date of Birth: 03/29/1940 Referring Provider: Dr. Tami Ribas  Encounter Date: 12/06/2015      End of Session - 12/06/15 1247    Visit Number 10   Number of Visits 17   Date for SLP Re-Evaluation 01/06/16   SLP Start Time 0900   SLP Stop Time  1000   SLP Time Calculation (min) 60 min      Past Medical History:  Diagnosis Date  . Breast CA ([place])    left  . 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 Diabetes ([place])   . HLD (hyperlipidemia)   . Hypertension    NO MEDS  . Pneumonia    SMALL SPOT ON LUNG, HX RADIATION PNEUMONITIS   . PONV (postoperative nausea and vomiting)   . RA (rheumatoid arthritis) ([place])   . 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 ABDOMINAL HYSTERECTOMY  1991  . ANTERIOR CERVICAL DECOMP/DISCECTOMY FUSION N/A 03/16/2015   Procedure: ANTERIOR  CERVICAL DECOMPRESSION FUSION CERVICAL TWO-THREE,CERVICAL THREE-FOUR WITH REMOVAL OF HARDWARE AT CERVICAL FOUR-FIVE.;  Surgeon: Earnie Larsson, MD;  Location: [place] [facility]UR[facility] ORS;  Service: Neurosurgery;  Laterality: N/A;  . APPENDECTOMY    . BREAST BIOPSY Left 08/31/2013   positive  . BREAST LUMPECTOMY Left 10/06/2013 & 11/03/2013  . CARPAL TUNNEL RELEASE Bilateral   . neck fusion    . REPLACEMENT TOTAL KNEE BILATERAL     x2  . ROTATOR CUFF REPAIR Right     There were no vitals filed for this visit.      Subjective Assessment - 12/06/15 1246    Subjective Patient agrees that her voice is improving   Currently in Pain? No/denies               ADULT SLP TREATMENT - 12/06/15 0001      General Information   Behavior/Cognition Alert;Cooperative;Pleasant mood     Treatment Provided   Treatment provided Cognitive-Linquistic     Pain Assessment   Pain Assessment No/denies pain     Cognitive-Linquistic Treatment   Treatment focused on Voice   Skilled Treatment The patient was provided with written and verbal teaching regarding vocal hygiene, specifically chronic throat clearing.  She is clearing her throat less.    The patient was provided with written and verbal teaching regarding neck, shoulder, and tongue stretches exercises to promote relaxed phonation. The patient was provided with written and verbal teaching for supplement vocal tract relaxation exercises (straw phonation and lip trills with pitch change).  The patient was provided with written and verbal teaching regarding breath support exercises.  The patient was provided with verbal and written instruction in pitch glides.  Patient able to sustain vowel at 80 dB with clear vocal quality and automatic speech at 75 dB with clear vocal quality.  Patient able to complete fundamental frequency games with the Visi Pitch with much greater ease.  Patient instructed in relaxed phonation / oral resonance. Maintains oral resonance in sentences and conversation with 80% accuracy given occasional cues.  Patient instructed in modified vocal function exercises and given written instructions.     Assessment / Recommendations / Plan   Plan Continue with current plan of  care     Progression Toward Goals   Progression toward goals Progressing toward goals          SLP Education - 12/24/2015 1246    Education provided Yes   Education Details Use visual and tactile feedback to reduce extrinsic laryngeal muscle tension   Person(s) Educated Patient   Methods Explanation   Comprehension Verbalized understanding            SLP Long Term Goals - 12-24-2015 1248      SLP LONG TERM GOAL #1   Title The patient will demonstrate independent understanding of vocal hygiene concepts and neck, shoulder, lingual  stretching exercises.   Time 8   Period Weeks   Status Partially Met     SLP LONG TERM GOAL #2   Title The patient will be independent for abdominal breathing and breath support exercises.   Time 8   Period Weeks   Status Partially Met     SLP LONG TERM GOAL #3   Title The patient will maximize voice quality and loudness using breath support for sustained vowel production, pitch glides, and hierarchal speech drill.   Time 8   Period Weeks   Status Partially Met     SLP LONG TERM GOAL #4   Title The patient will maximize voice quality and loudness using breath support for paragraph length recitation with 80% accuracy.   Time 8   Period Weeks   Status Partially Met          Plan - 12-24-15 1247    Clinical Impression Statement Patient able to improve vocal quality with nasality to improve oral resonance and loudness to decrease laryngeal strain.  The patient demonstrates less recruitment of excessive extrinsic laryngeal muscle contraction when attempting to change vocal habits.  Patient demonstrates increased laryngeal movement with pitch glides.  She is demonstrating improved pitch control in bio-feedback fundamental frequency tasks with the Visi Pitch and matching her pitch to piano notes.   Speech Therapy Frequency 2x / week   Duration Other (comment)  8 weeks   Treatment/Interventions SLP instruction and feedback;Compensatory strategies;Patient/family education;Other (comment)  Voice therapy   Potential to Achieve Goals Good   Potential Considerations Ability to learn/carryover information;Cooperation/participation level;Previous level of function;Family/community support   SLP Home Exercise Plan neck, shoulder, tongue, and tongue stretches; supplemental vocal tract relaxation exercises;  breath support exercises; pitch control exercises; relaxed phonation / oral resonance; vocal function exercises (modified)     Consulted and Agree with Plan of Care Patient      Patient  will benefit from skilled therapeutic intervention in order to improve the following deficits and impairments:   Dysphonia      G-Codes - 2015/12/24 1249    Functional Assessment Tool Used clinical judgment   Functional Limitations Voice   Voice Current Status (G9171) At least 20 percent but less than 40 percent impaired, limited or restricted   Voice Goal Status (G9172) At least 1 percent but less than 20 percent impaired, limited or restricted      Problem List Patient Active Problem List   Diagnosis Date Noted  . B12 deficiency 06/27/2015  . Pneumonitis 05/16/2015  . PNA (pneumonia) 05/16/2015  . Cervical stenosis of spinal canal 03/16/2015  . Cervical spinal stenosis 03/16/2015  . Acute URI 03/10/2015  . URI, acute 02/16/2015  . Type 2 diabetes mellitus ([place]) 12/25/2014  . Neuropathy involving both lower extremities ([place]) 09/28/2014  . Upper airway cough syndrome 09/16/2014  . Radiation  pneumonitis ([place]) 06/10/2014  . Lung consolidation ([place]) 05/25/2014  . Cough 05/25/2014  . Breast cancer in situ 03/23/2014  . Rheumatoid arthritis involving multiple joints ([place]) 03/23/2014  . HLD (hyperlipidemia) 05/04/2013  . BP (high blood pressure) 05/04/2013   Leroy Sea, MS/CCC- SLP  Lou Miner 12/06/2015, 12:49 PM  [place] MAIN [facility] SERVICES [address] [place], [place], [address] Phone: [phone]   Fax:  [phone]   Name: Ashley Bray MRN: [id number] Date of Birth: [birth year]

## 2015-12-08 ENCOUNTER — Ambulatory Visit: Payer: Medicare Other | Admitting: Speech Pathology

## 2015-12-08 ENCOUNTER — Encounter: Payer: Self-pay | Admitting: Speech Pathology

## 2015-12-08 DIAGNOSIS — R490 Dysphonia: Secondary | ICD-10-CM | POA: Diagnosis not present

## 2015-12-08 NOTE — Therapy (Signed)
[place] MAIN [facility] SERVICES [address] [place], [place], [address] Phone: [phone]   Fax:  [phone]  Speech Language Pathology Treatment  Patient Details  Name: Ashley Bray MRN: [id number] Date of Birth: 1940-08-11 Referring Provider: Dr. Tami Ribas  Encounter Date: 12/08/2015      End of Session - 12/08/15 1403    Visit Number 11   Number of Visits 17   Date for SLP Re-Evaluation 01/06/16   SLP Start Time 73   SLP Stop Time  1400   SLP Time Calculation (min) 60 min   Activity Tolerance Patient tolerated treatment well      Past Medical History:  Diagnosis Date  . Breast CA ([place])    left  . 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 Diabetes ([place])   . HLD (hyperlipidemia)   . Hypertension    NO MEDS  . Pneumonia    SMALL SPOT ON LUNG, HX RADIATION PNEUMONITIS   . PONV (postoperative nausea and vomiting)   . RA (rheumatoid arthritis) ([place])   . 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 ABDOMINAL HYSTERECTOMY  1991  . ANTERIOR CERVICAL DECOMP/DISCECTOMY FUSION N/A 03/16/2015   Procedure: ANTERIOR  CERVICAL DECOMPRESSION FUSION CERVICAL TWO-THREE,CERVICAL THREE-FOUR WITH REMOVAL OF HARDWARE AT CERVICAL FOUR-FIVE.;  Surgeon: Earnie Larsson, MD;  Location: [place] [facility];  Service: Neurosurgery;  Laterality: N/A;  . APPENDECTOMY    . BREAST BIOPSY Left 08/31/2013   positive  . BREAST LUMPECTOMY Left 10/06/2013 & 11/03/2013  . CARPAL TUNNEL RELEASE Bilateral   . neck fusion    . REPLACEMENT TOTAL KNEE BILATERAL     x2  . ROTATOR CUFF REPAIR Right     There were no vitals filed for this visit.      Subjective Assessment - 12/08/15 1402    Subjective Patient reports that she was able to participate in choir practice, using healthy vocal habits, for 45 minutes.   Currently in Pain? No/denies               ADULT SLP  TREATMENT - 12/08/15 0001      General Information   Behavior/Cognition Alert;Cooperative;Pleasant mood     Treatment Provided   Treatment provided Cognitive-Linquistic     Pain Assessment   Pain Assessment No/denies pain     Cognitive-Linquistic Treatment   Treatment focused on Voice   Skilled Treatment The patient was provided with written and verbal teaching regarding vocal hygiene, specifically chronic throat clearing.  She is clearing her throat less.    The patient was provided with written and verbal teaching regarding neck, shoulder, and tongue stretches exercises to promote relaxed phonation. The patient was provided with written and verbal teaching for supplement vocal tract relaxation exercises (straw phonation and lip trills with pitch change).  The patient was provided with written and verbal teaching regarding breath support exercises.  The patient was provided with verbal and written instruction in pitch glides.  Patient able to sustain vowel at 80 dB with clear vocal quality and automatic speech at 75 dB with clear vocal quality.  Patient able to complete fundamental frequency games with the Visi Pitch with much greater ease.  Patient instructed in relaxed phonation / oral resonance. Maintains oral resonance in sentences and conversation with 80% accuracy given occasional cues.  Patient instructed in modified vocal function exercises and given  written instructions.  Patient able to match pitch for the octave from middle C.      Assessment / Recommendations / Plan   Plan Continue with current plan of care     Progression Toward Goals   Progression toward goals Progressing toward goals          SLP Education - 12/08/15 1402    Education provided Yes   Education Details Use loudness to power voice   Person(s) Educated Patient   Methods Explanation   Comprehension Verbalized understanding            SLP Long Term Goals - 12/06/15 1248      SLP LONG TERM GOAL #1    Title The patient will demonstrate independent understanding of vocal hygiene concepts and neck, shoulder, lingual stretching exercises.   Time 8   Period Weeks   Status Partially Met     SLP LONG TERM GOAL #2   Title The patient will be independent for abdominal breathing and breath support exercises.   Time 8   Period Weeks   Status Partially Met     SLP LONG TERM GOAL #3   Title The patient will maximize voice quality and loudness using breath support for sustained vowel production, pitch glides, and hierarchal speech drill.   Time 8   Period Weeks   Status Partially Met     SLP LONG TERM GOAL #4   Title The patient will maximize voice quality and loudness using breath support for paragraph length recitation with 80% accuracy.   Time 8   Period Weeks   Status Partially Met          Plan - 12/08/15 1403    Clinical Impression Statement Patient able to improve vocal quality with nasality to improve oral resonance and loudness to decrease laryngeal strain.  The patient demonstrates less recruitment of excessive extrinsic laryngeal muscle contraction.  Patient demonstrates increased laryngeal movement with pitch glides.  She is demonstrating improved pitch control in bio-feedback fundamental frequency tasks with the Visi Pitch and matching her pitch to piano notes.   Speech Therapy Frequency 2x / week   Duration Other (comment)   Treatment/Interventions SLP instruction and feedback;Compensatory strategies;Patient/family education;Other (comment)   Potential to Achieve Goals Good   Potential Considerations Ability to learn/carryover information;Cooperation/participation level;Previous level of function;Family/community support   SLP Home Exercise Plan neck, shoulder, tongue, and tongue stretches; supplemental vocal tract relaxation exercises;  breath support exercises; pitch control exercises; relaxed phonation / oral resonance; vocal function exercises (modified)     Consulted and  Agree with Plan of Care Patient      Patient will benefit from skilled therapeutic intervention in order to improve the following deficits and impairments:   Dysphonia    Problem List Patient Active Problem List   Diagnosis Date Noted  . B12 deficiency 06/27/2015  . Pneumonitis 05/16/2015  . PNA (pneumonia) 05/16/2015  . Cervical stenosis of spinal canal 03/16/2015  . Cervical spinal stenosis 03/16/2015  . Acute URI 03/10/2015  . URI, acute 02/16/2015  . Type 2 diabetes mellitus ([place]) 12/25/2014  . 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 Upper airway cough syndrome 09/16/2014  . 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 Lung consolidation ([place]) 05/25/2014  . Cough 05/25/2014  . Breast cancer in situ 03/23/2014  . Rheumatoid arthritis involving multiple joints ([place]) 03/23/2014  . HLD (hyperlipidemia) 05/04/2013  . BP (high blood pressure) 05/04/2013   Leroy Sea, MS/CCC- SLP  Lou Miner 12/08/2015, 2:04 PM  [place] MAIN [facility] SERVICES [address] [place], [place], [address] Phone: [phone]   Fax:  [phone]   Name: KHAMANI DANIELY MRN: [id number] Date of Birth: [birth year]

## 2015-12-09 ENCOUNTER — Ambulatory Visit: Payer: Medicare Other | Admitting: Speech Pathology

## 2015-12-12 ENCOUNTER — Ambulatory Visit: Payer: Medicare Other | Admitting: Speech Pathology

## 2015-12-12 ENCOUNTER — Encounter: Payer: Self-pay | Admitting: Speech Pathology

## 2015-12-12 DIAGNOSIS — R490 Dysphonia: Secondary | ICD-10-CM

## 2015-12-12 NOTE — Therapy (Signed)
[place] MAIN [facility] SERVICES [address] [place], [place], [address] Phone: [phone]   Fax:  [phone]  Speech Language Pathology Treatment  Patient Details  Name: Ashley Bray MRN: [id number] Date of Birth: 1940/09/13 Referring Provider: Dr. Tami Ribas  Encounter Date: 12/12/2015      End of Session - 12/12/15 1601    Visit Number 12   Number of Visits 17   Date for SLP Re-Evaluation 01/06/16   SLP Start Time 1453   SLP Stop Time  1543   SLP Time Calculation (min) 50 min   Activity Tolerance Patient tolerated treatment well      Past Medical History:  Diagnosis Date  . Breast CA ([place])    left  . 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 Diabetes ([place])   . HLD (hyperlipidemia)   . Hypertension    NO MEDS  . Pneumonia    SMALL SPOT ON LUNG, HX RADIATION PNEUMONITIS   . PONV (postoperative nausea and vomiting)   . RA (rheumatoid arthritis) ([place])   . Radiation pneumonitis [facility])     Past Surgical History:  Procedure Laterality Date  . ABDOMINAL HYSTERECTOMY  1991  . ANTERIOR CERVICAL DECOMP/DISCECTOMY FUSION N/A 03/16/2015   Procedure: ANTERIOR  CERVICAL DECOMPRESSION FUSION CERVICAL TWO-THREE,CERVICAL THREE-FOUR WITH REMOVAL OF HARDWARE AT CERVICAL FOUR-FIVE.;  Surgeon: Earnie Larsson, MD;  Location: [place] [facility];  Service: Neurosurgery;  Laterality: N/A;  . APPENDECTOMY    . BREAST BIOPSY Left 08/31/2013   positive  . BREAST LUMPECTOMY Left 10/06/2013 & 11/03/2013  . CARPAL TUNNEL RELEASE Bilateral   . neck fusion    . REPLACEMENT TOTAL KNEE BILATERAL     x2  . ROTATOR CUFF REPAIR Right     There were no vitals filed for this visit.      Subjective Assessment - 12/12/15 1559    Subjective The patient reports that she was able to participate in choir practice but did not perform in service. Hopes to perform next week. The patient also reports that her vocal performance  today may be affected by earlier nasoendoscopy and vocal practice at home.   Currently in Pain? No/denies               ADULT SLP TREATMENT - 12/12/15 0001      General Information   Behavior/Cognition Alert;Cooperative;Pleasant mood     Treatment Provided   Treatment provided Cognitive-Linquistic     Pain Assessment   Pain Assessment No/denies pain     Cognitive-Linquistic Treatment   Treatment focused on Voice   Skilled Treatment The patient was provided with verbal teaching regarding vocal hygiene, specifically chronic throat clearing.  Although she sometimes still clears throat, she is recognizing quickly and trying to suppress. The patient was provided with verbal teaching and modeling for supplement vocal tract relaxation exercises.  The patient was provided with verbal teaching and modeling regarding breath support exercises. Patient able to read paragraphs aloud at 75 dB with clear vocal quality when given minimal cues to reduce tension/promote oral resonance.  Patient able to complete fundamental frequency games with the Visi Pitch with much greater ease and precision in achieving target pitch.  Patient instructed in relaxed phonation / oral resonance. Maintains oral resonance in sentences and conversation with 80% accuracy given occasional cues.  Patient instructed in modified vocal function exercises and given written instructions.  Patient able to match pitch  for the octave from middle C.     Assessment / Recommendations / Plan   Plan Continue with current plan of care     Progression Toward Goals   Progression toward goals Progressing toward goals          SLP Education - 12/12/15 1600    Education provided Yes   Education Details Breath support    Person(s) Educated Patient   Methods Explanation;Demonstration   Comprehension Verbalized understanding;Returned demonstration            SLP Long Term Goals - 12/06/15 1248      SLP LONG TERM GOAL #1    Title The patient will demonstrate independent understanding of vocal hygiene concepts and neck, shoulder, lingual stretching exercises.   Time 8   Period Weeks   Status Partially Met     SLP LONG TERM GOAL #2   Title The patient will be independent for abdominal breathing and breath support exercises.   Time 8   Period Weeks   Status Partially Met     SLP LONG TERM GOAL #3   Title The patient will maximize voice quality and loudness using breath support for sustained vowel production, pitch glides, and hierarchal speech drill.   Time 8   Period Weeks   Status Partially Met     SLP LONG TERM GOAL #4   Title The patient will maximize voice quality and loudness using breath support for paragraph length recitation with 80% accuracy.   Time 8   Period Weeks   Status Partially Met          Plan - 12/12/15 1601    Clinical Impression Statement Patient able to improve vocal quality with oral resonance and loudness to decrease laryngeal strain.  The patient demonstrates less recruitment of excessive extrinsic laryngeal muscle contraction.  Patient demonstrates increased laryngeal movement with pitch glides.  She is demonstrating improved pitch control in bio-feedback fundamental frequency tasks with the Visi Pitch and matching her pitch to piano notes. The patient reported that her performance today was negatively impacted by a nasoendoscopy earlier in the day and possibly excessive practice at home prior to session. She required occasional cues and models regarding relaxation of extrinsic laryngeal muscles, breath support, oral resonance, and loudness but demonstrated accurate self-monitoring when requested feedback on her performance. Required cues to suppress throat clearing.    Speech Therapy Frequency 2x / week   Duration Other (comment)  Scheduled for another visit this week, may schedule additional sessions based on performance   Treatment/Interventions SLP instruction and  feedback;Compensatory strategies;Patient/family education;Other (comment)   Potential to Achieve Goals Good   Potential Considerations Ability to learn/carryover information;Cooperation/participation level;Previous level of function;Family/community support   SLP Home Exercise Plan neck, shoulder, tongue, and tongue stretches; supplemental vocal tract relaxation exercises;  breath support exercises; pitch control exercises; relaxed phonation / oral resonance; vocal function exercises (modified)     Consulted and Agree with Plan of Care Patient      Patient will benefit from skilled therapeutic intervention in order to improve the following deficits and impairments:   Dysphonia    Problem List Patient Active Problem List   Diagnosis Date Noted  . B12 deficiency 06/27/2015  . Pneumonitis 05/16/2015  . PNA (pneumonia) 05/16/2015  . Cervical stenosis of spinal canal 03/16/2015  . Cervical spinal stenosis 03/16/2015  . Acute URI 03/10/2015  . URI, acute 02/16/2015  . Type 2 diabetes mellitus ([place]) 12/25/2014  . Neuropathy involving both lower  extremities ([place]) 09/28/2014  . Upper airway cough syndrome 09/16/2014  . Radiation pneumonitis ([place]) 06/10/2014  . Lung consolidation ([place]) 05/25/2014  . Cough 05/25/2014  . Breast cancer in situ 03/23/2014  . Rheumatoid arthritis involving multiple joints ([place]) 03/23/2014  . HLD (hyperlipidemia) 05/04/2013  . BP (high blood pressure) 05/04/2013    Rickard Rhymes  Graduate Student Clinician 12/12/2015, 4:04 PM  [place] MAIN [facility] SERVICES [address] [place], [place], [address] Phone: [phone]   Fax:  [phone]   Name: Ashley Bray MRN: [id number] Date of Birth: 1940/12/01

## 2015-12-14 ENCOUNTER — Telehealth: Payer: Self-pay | Admitting: Internal Medicine

## 2015-12-14 NOTE — Telephone Encounter (Signed)
Called and spoke with patient and she stated that Dr. Tami Ribas advised her that the inhaler that she was placed on could cause hoarseness in some people and pt  was wanting to know what Dr. Stevenson Clinch recommended.   Addressed this issue with Dr. Stevenson Clinch who stated to contact the patient and advise patient that she could stop the [place], however, if her breathing issues returned to start back on the [place] and to let us know if she started having breathing issues off the [place].    Pt was advised and voiced understanding and stated that she would stop the Anora and would let us know if breathing issues returned and if they did she would start the [place] back.    Follow Up appointment arranged for patient on 01/17/16 at 9:00 and CT Chest prior to appointment has been scheduled for 01/13/16 at 10:00 at [facility].    Will forward to Dr. Stevenson Clinch to sign off on phone note. Rhonda J Cobb

## 2015-12-16 ENCOUNTER — Encounter: Payer: Self-pay | Admitting: Speech Pathology

## 2015-12-16 ENCOUNTER — Ambulatory Visit: Payer: Medicare Other | Admitting: Speech Pathology

## 2015-12-16 DIAGNOSIS — R490 Dysphonia: Secondary | ICD-10-CM

## 2015-12-16 NOTE — Therapy (Signed)
[place] MAIN [facility] SERVICES [address] [place], [place], [address] Phone: [phone]   Fax:  [phone]  Speech Language Pathology Treatment / Discharge Summary  Patient Details  Name: Ashley Bray MRN: [id number] Date of Birth: 1940/11/18 Referring Provider: Dr. Tami Ribas  Encounter Date: 12/16/2015      End of Session - 12/16/15 1220    Visit Number 13   Number of Visits 17   Date for SLP Re-Evaluation 12/16/15   SLP Start Time 9357   SLP Stop Time  1138   SLP Time Calculation (min) 50 min   Activity Tolerance Patient tolerated treatment well      Past Medical History:  Diagnosis Date  . Breast CA ([place])    left  . 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 Diabetes ([place])   . HLD (hyperlipidemia)   . Hypertension    NO MEDS  . Pneumonia    SMALL SPOT ON LUNG, HX RADIATION PNEUMONITIS   . PONV (postoperative nausea and vomiting)   . RA (rheumatoid arthritis) ([place])   . Radiation pneumonitis [facility])     Past Surgical History:  Procedure Laterality Date  . ABDOMINAL HYSTERECTOMY  1991  . ANTERIOR CERVICAL DECOMP/DISCECTOMY FUSION N/A 03/16/2015   Procedure: ANTERIOR  CERVICAL DECOMPRESSION FUSION CERVICAL TWO-THREE,CERVICAL THREE-FOUR WITH REMOVAL OF HARDWARE AT CERVICAL FOUR-FIVE.;  Surgeon: Earnie Larsson, MD;  Location: [place] [facility];  Service: Neurosurgery;  Laterality: N/A;  . APPENDECTOMY    . BREAST BIOPSY Left 08/31/2013   positive  . BREAST LUMPECTOMY Left 10/06/2013 & 11/03/2013  . CARPAL TUNNEL RELEASE Bilateral   . neck fusion    . REPLACEMENT TOTAL KNEE BILATERAL     x2  . ROTATOR CUFF REPAIR Right     There were no vitals filed for this visit.      Subjective Assessment - 12/16/15 1219    Subjective The patient reported she read the paper aloud yesterday and was surprised by how strong her vocal performance was during that activity. She recognizes that her  voice is "much better." At end of session, the patient expressed readiness to discharge at this time, indicating desire to try independently implementing strategies learned during therapy. If the patient has difficulty maintaining gains or feels that she requires additional voice therapy in the future, she will contact her physician for another referral.    Currently in Pain? No/denies               ADULT SLP TREATMENT - 12/16/15 0001      General Information   Behavior/Cognition Alert;Cooperative;Pleasant mood     Treatment Provided   Treatment provided Cognitive-Linquistic     Pain Assessment   Pain Assessment No/denies pain     Cognitive-Linquistic Treatment   Treatment focused on Voice   Skilled Treatment The patient independently completed neck stretching exercises. She read aloud with good breath support, vocal quality, and oral resonance at an average of 70 dB. The patient occasionally demonstrated brief moments of visible tension of external laryngeal muscles and/or audible vocal tension during "loud talking," most often at the beginning of an utterance. The patient was able to identify tension. After one cue and model, the patient self-monitored for tension and corrected by pausing and implementing relaxation strategies to continue. After one cue, the patient also self-monitored for throat clearing and independently implemented cough distraction strategies in 70% of instances. The patient  completed rising and falling musical scales with good vocal quality, breath support, and oral resonance and was able to match pitch with moderate ease and precision for the octave from middle C. She also completed Visi Pitch fundamental frequency games with increased ease, precision, and speed.       Assessment / Recommendations / Plan   Plan Discharge SLP treatment due to (comment)  goals met     Progression Toward Goals   Progression toward goals Goals met, education completed, patient  discharged from SLP          SLP Education - 12/16/15 1220    Education provided Yes   Education Details Reminded patient to implement cough distraction strategies. Provided visual reminder for at-home exercise routine including neck stretches, 10-15 minutes loud talking, and pitch practice. Also reminded patient to self-monitor for tension and poor vocal quality, pause, and correct through behaviors learned in therapy. Pointed out moments of tension and praised patient for vocal quality/tension self-monitoring and correction during session.     Person(s) Educated Patient   Methods Explanation;Demonstration   Comprehension Verbalized understanding;Returned demonstration            SLP Long Term Goals - 12/16/15 1223      SLP LONG TERM GOAL #1   Title The patient will demonstrate independent understanding of vocal hygiene concepts and neck, shoulder, lingual stretching exercises.   Time 8   Period Weeks   Status Achieved     SLP LONG TERM GOAL #2   Title The patient will be independent for abdominal breathing and breath support exercises.   Time 8   Period Weeks   Status Achieved     SLP LONG TERM GOAL #3   Title The patient will maximize voice quality and loudness using breath support for sustained vowel production, pitch glides, and hierarchal speech drill.   Time 8   Period Weeks   Status Achieved     SLP LONG TERM GOAL #4   Title The patient will maximize voice quality and loudness using breath support for paragraph length recitation with 80% accuracy.   Time 8   Period Weeks   Status Achieved          Plan - 12/16/15 1221    Clinical Impression Statement Patient is able to improve vocal quality with neck stretching exercises, oral resonance, and loudness with good breath support to decrease laryngeal strain.  The patient demonstrates markedly reduced recruitment of excessive extrinsic laryngeal muscle contraction - only occasional moments of tension at this time.   Patient demonstrates increased laryngeal movement with pitch glides.  She is demonstrating moderately strong pitch control in bio-feedback fundamental frequency tasks with the Visi Pitch and matching her pitch to piano notes. She required minimal initial cues for relaxation of extrinsic laryngeal muscles, vocal quality, breath support, and suppression of throat clearing. After initial cues, she self-monitored and self-corrected for these qualities. The patient reported that she was ready to try independently implementing strategies learned during therapy and will call her physician if she requires more voice therapy. She hopes to be able to sing in the choir this week.    Speech Therapy Frequency 2x / week   Duration Other (comment)  last session today   Treatment/Interventions SLP instruction and feedback;Compensatory strategies;Patient/family education;Other (comment)   Potential to Achieve Goals Good   Potential Considerations Ability to learn/carryover information;Cooperation/participation level;Previous level of function;Family/community support   SLP Home Exercise Plan neck, shoulder, tongue, and tongue stretches; supplemental vocal  tract relaxation exercises;  breath support exercises; pitch control exercises; relaxed phonation / oral resonance; vocal function exercises (modified)     Consulted and Agree with Plan of Care Patient      Patient will benefit from skilled therapeutic intervention in order to improve the following deficits and impairments:   Dysphonia    Problem List Patient Active Problem List   Diagnosis Date Noted  . B12 deficiency 06/27/2015  . Pneumonitis 05/16/2015  . PNA (pneumonia) 05/16/2015  . Cervical stenosis of spinal canal 03/16/2015  . Cervical spinal stenosis 03/16/2015  . Acute URI 03/10/2015  . URI, acute 02/16/2015  . Type 2 diabetes mellitus ([place]) 12/25/2014  . Neuropathy involving both lower extremities ([place]) 09/28/2014  . Upper airway cough  syndrome 09/16/2014  . Radiation pneumonitis ([place]) 06/10/2014  . Lung consolidation ([place]) 05/25/2014  . Cough 05/25/2014  . Breast cancer in situ 03/23/2014  . Rheumatoid arthritis involving multiple joints ([place]) 03/23/2014  . HLD (hyperlipidemia) 05/04/2013  . BP (high blood pressure) 05/04/2013    Rickard Rhymes  Graduate Student Clinician 12/16/2015, 12:26 PM  [place] MAIN [facility] SERVICES [address] [place], [place], [address] Phone: [phone]   Fax:  [phone]   Name: Ashley Bray MRN: [id number] Date of Birth: 04-17-1940

## 2015-12-19 ENCOUNTER — Ambulatory Visit: Payer: Medicare Other | Admitting: Speech Pathology

## 2015-12-23 ENCOUNTER — Ambulatory Visit: Payer: Medicare Other | Admitting: Speech Pathology

## 2016-01-13 ENCOUNTER — Ambulatory Visit
Admission: RE | Admit: 2016-01-13 | Discharge: 2016-01-13 | Disposition: A | Discharge status: Home / Self Care | Payer: Medicare Other | Source: Ambulatory Visit | Attending: Internal Medicine | Admitting: Internal Medicine

## 2016-01-13 DIAGNOSIS — J8410 Pulmonary fibrosis, unspecified: Secondary | ICD-10-CM | POA: Insufficient documentation

## 2016-01-13 DIAGNOSIS — I700 Atherosclerosis of aorta: Secondary | ICD-10-CM | POA: Insufficient documentation

## 2016-01-13 DIAGNOSIS — J984 Other disorders of lung: Secondary | ICD-10-CM | POA: Diagnosis not present

## 2016-01-13 DIAGNOSIS — J9811 Atelectasis: Secondary | ICD-10-CM | POA: Diagnosis not present

## 2016-01-13 DIAGNOSIS — J700 Acute pulmonary manifestations due to radiation: Secondary | ICD-10-CM | POA: Diagnosis present

## 2016-01-13 DIAGNOSIS — R911 Solitary pulmonary nodule: Secondary | ICD-10-CM | POA: Diagnosis not present

## 2016-01-13 MED ORDER — IOPAMIDOL (ISOVUE-300) INJECTION 61%
75.0000 mL | Freq: Once | INTRAVENOUS | Status: AC | PRN
  Administered 2016-01-13: 75 mL via INTRAVENOUS

## 2016-01-17 ENCOUNTER — Encounter: Payer: Self-pay | Admitting: Internal Medicine

## 2016-01-17 ENCOUNTER — Ambulatory Visit (INDEPENDENT_AMBULATORY_CARE_PROVIDER_SITE_OTHER): Payer: Medicare Other | Admitting: Internal Medicine

## 2016-01-17 VITALS — BP 120/70 | HR 67 | Ht 62.0 in | Wt 129.0 lb

## 2016-01-17 DIAGNOSIS — R05 Cough: Secondary | ICD-10-CM

## 2016-01-17 DIAGNOSIS — R059 Cough, unspecified: Secondary | ICD-10-CM

## 2016-01-17 DIAGNOSIS — R918 Other nonspecific abnormal finding of lung field: Secondary | ICD-10-CM | POA: Insufficient documentation

## 2016-01-17 DIAGNOSIS — J189 Pneumonia, unspecified organism: Secondary | ICD-10-CM

## 2016-01-17 NOTE — Patient Instructions (Addendum)
Follow up with Dr. Mortimer Fries in 4 months - CT Chest w/o contrast for RUL opacity prior to follow up visit - stop Anoro. - If cough and sob return, then we will consider starting Spiriva Respimat.  - cont with allergy control

## 2016-01-17 NOTE — Assessment & Plan Note (Signed)
Image reviewed. Hx of L breast Ca Location of opacity is concerning, however given the appearance of mucus, this could also be a post inflammatory response. Discussed with patient, plans for surveillance at this time, no bronchoscopy.  Plan: Repeat CT Chest w/o contrast in 4 months.

## 2016-01-17 NOTE — Progress Notes (Signed)
[facility] Consultation      MRN# [id number] Ashley Bray 1941/02/06   CC: Chief Complaint  Patient presents with  . Follow-up    CT results; feels she is doing well: no SOB; cough when food goes wrong way;: seen ENT   August 2015, found to have consolidation groundglass opacities repeat CT March 2016, history of left breast cancer status post radiation.. Bronchoscopy with BAL showed no atypical organisms and all cultures were negative. 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 Has some post-radiation fibrosis.    Events since last clinic visit: Presents for follow up of radiation pneumonitis and cough CT Chest reviewed today, RUL 45m opacity with surrounding mucus plugging, plan for repeat CT in 4 months.  Currently she is doing well, has a slight nonproductive cough which is now baseline for her, and some mild shortness of breath, however is able to perform daily activities and participate in walking events without any significant dyspnea. Has stated that she is having some voice hoarseness since her spinal stenosis surgery and December 2016, had speech and ENT follow up, they recommended stopping Anoro.  Patient doesn't feel like Anoro is adding much benefit at this time.   Medication:    Current Outpatient Prescriptions:  .  acetaminophen (TYLENOL) 500 MG tablet, Take 500 mg by mouth every 8 (eight) hours as needed for mild pain or moderate pain., Disp: , Rfl:  .  AMBULATORY NON FORMULARY MEDICATION, Medication Name: incentive spirometry  Use as directed, Disp: 1 each, Rfl: 0 .  benzonatate (TESSALON) 100 MG capsule, Take 1 capsule by mouth 3 (three) times daily as needed for cough. , Disp: , Rfl: 0 .  Blood Glucose Monitoring Suppl (ONE TOUCH ULTRA 2) W/DEVICE KIT, , Disp: , Rfl: 11 .  cetirizine (ZYRTEC ALLERGY) 10 MG tablet, Take 1 tablet (10 mg total) by mouth  daily., Disp: 30 tablet, Rfl: 12 .  cyclobenzaprine (FLEXERIL) 10 MG tablet, Take 1 tablet (10 mg total) by mouth 3 (three) times daily as needed for muscle spasms., Disp: 30 tablet, Rfl: 0 .  folic acid (FOLVITE) 1 MG tablet, Take 1 mg by mouth daily., Disp: , Rfl: 11 .  gabapentin (NEURONTIN) 100 MG capsule, Take 100 mg by mouth 3 (three) times daily., Disp: , Rfl:  .  GLUCAGON EMERGENCY 1 MG injection, Inject 1 mg into the muscle once as needed. , Disp: , Rfl: 0 .  hydroxychloroquine (PLAQUENIL) 200 MG tablet, Take 200 mg by mouth daily., Disp: , Rfl:  .  ipratropium (ATROVENT) 0.03 % nasal spray, Place 2 sprays into the nose 4 (four) times daily., Disp: 30 mL, Rfl: 1 .  meloxicam (MOBIC) 7.5 MG tablet, Take 7.5 mg by mouth daily as needed for pain., Disp: , Rfl:  .  metFORMIN (GLUCOPHAGE) 500 MG tablet, Take 1,000 mg by mouth 2 (two) times daily with a meal., Disp: , Rfl:  .  montelukast (SINGULAIR) 10 MG tablet, Take 1 tablet (10 mg total) by mouth at bedtime. (Patient taking differently: Take 10 mg by mouth at bedtime as needed. ), Disp: 30 tablet, Rfl: 1 .  pravastatin (PRAVACHOL) 40 MG tablet, Take 40 mg by mouth daily., Disp: , Rfl:  .  Respiratory Therapy Supplies (FLUTTER) DEVI, 1 each by Does not apply route once., Disp: 1 each, Rfl: 0 .  vitamin B-12 (CYANOCOBALAMIN) 1000 MCG tablet, Take 1,000 mcg by mouth 2 (two) times daily.,  Disp: , Rfl:  .  ANORO ELLIPTA 62.5-25 MCG/INH AEPB, INHALE 1 PUFF INTO THE LUNGS DAILY (Patient not taking: Reported on 01/17/2016), Disp: 60 each, Rfl: 5    Review of Systems  Constitutional: Negative for chills, fever and weight loss.  HENT: Negative for congestion and ear discharge.        Mild nasal drainage.   Eyes: Negative.  Negative for blurred vision.  Respiratory: Positive for cough. Negative for sputum production and shortness of breath.        Mild cough - more clearing of the throat  Cardiovascular: Negative for chest pain and palpitations.    Gastrointestinal: Negative for heartburn, nausea and vomiting.  Genitourinary: Negative.   Skin: Negative for itching and rash.  Neurological: Positive for tingling. Negative for headaches.  Endo/Heme/Allergies: Negative.   Psychiatric/Behavioral: Negative.       Allergies:  Codeine  Physical Examination:  VS: BP 120/70 (BP Location: Right Arm, Cuff Size: Normal)   Pulse 67   Ht '5\' 2"'  (1.575 m)   Wt 129 lb (58.5 kg)   SpO2 98%   BMI 23.59 kg/m   General Appearance: No distress  HEENT: PERRLA, no ptosis, no other lesions noticed Pulmonary:normal breath sounds., diaphragmatic excursion normal.No wheezing, No rales   Cardiovascular:  Normal S1,S2.  No m/r/g.     Abdomen:Exam: Benign, Soft, non-tender, No masses  Skin:   warm, no rashes, no ecchymosis  Extremities: normal, no cyanosis, clubbing, warm with normal capillary refill.      Rad results: (The following images and results were reviewed by Dr. Stevenson Clinch on 01/17/2016). CT Chest with Contrast 01/13/16 CT CHEST WITH CONTRAST  TECHNIQUE: Multidetector CT imaging of the chest was performed during intravenous contrast administration.  CONTRAST:  17m ISOVUE-300 IOPAMIDOL (ISOVUE-300) INJECTION 61%  COMPARISON:  Multiple exams, including 07/12/2015  FINDINGS: Cardiovascular: Atherosclerotic calcification of the aortic arch.  Mediastinum/Nodes: Right hilar node 0.7 cm in short axis on image 69/2.  Lungs/Pleura: 6 by 4 mm ground-glass density pulmonary nodule in the apical segment right upper lobe image 32/3, new from prior. There is some adjacent plugging of a subsegmental bronchus in the right upper lobe.  Minimal atelectasis medially in the right lower lobe on image 132 series 3. Mild scarring in the right lower lobe.  Mild radiation fibrosis anteriorly in the left upper lobe  Upper Abdomen: Unremarkable  Musculoskeletal: Degenerative glenohumeral arthropathy bilaterally. Degenerative  sternoclavicular arthropathy, right greater than left. Thoracic and lower cervical spondylosis. No compelling findings of osseous metastatic disease.  Soft tissue fullness in the left upper outer breast, not changed from prior, image 75/2.  IMPRESSION: 1. New 5 mm in diameter ground-glass density pulmonary nodule in the apical segment right upper lobe. There is some adjacent mild airway plugging. No follow-up recommended. This recommendation follows the consensus statement: Guidelines for Management of Incidental Pulmonary Nodules Detected on CT Images: From the Fleischner Society 2017; Radiology 2017; 284:228-243. 2. Mild radiation fibrosis anteriorly in the left upper lobe. 3. Soft tissue density in the left upper outer breast, not changed from prior exam. 4. Degenerative glenohumeral and sternoclavicular arthropathy bilaterally. 5. Minimal atelectasis in the right lower lobe with some associated scarring. 6. Atherosclerotic calcification of the aortic arch.    Assessment and Plan: 75year old female history of left-sided breast cancer status post chemotherapy, radiation, mastectomy, now with chronic cough, allergic rhinitis, radiation pneumonitis, and intermittent shortness of breath. RUL opacity Image reviewed. Hx of L breast Ca Location of opacity is concerning, however  given the appearance of mucus, this could also be a post inflammatory response. Discussed with patient, plans for surveillance at this time, no bronchoscopy.  Plan: Repeat CT Chest w/o contrast in 4 months.   Cough Possible vocal cord irritation/injury after cervical stenosis surgery in 02/2015, had ENT and speech follow up, they recommend stopping dry powders. Discussed with patient, we will stop Anoro.  If cough and SOB return, then we can consider starting [place] with current allergy regiment - cont with nasal saline rinse - acapella valve - may stop if worsening voice issues - ICS  daily (10-15 times per day) - we will stop Anoro, if cough and sob return, then start Spiriva Respimat   Pneumonitis CT scan reviewed with patient. RUL opacity seen and discussed witt the patient.  New 12m RUL opacity, will plan for follow up CT at next visit.   mild persistent cough. Mild cough today, mostly allergy/irriattion related.   Plan: -Continue with over-the-counter allergy regiment, and nasal sinus rinse -Incentive spirometer 10-15 times per day  -Routine surveillance chest CT prior to follow-up visit    Updated Medication List Outpatient Encounter Prescriptions as of 01/17/2016  Medication Sig  . acetaminophen (TYLENOL) 500 MG tablet Take 500 mg by mouth every 8 (eight) hours as needed for mild pain or moderate pain.  .Marland KitchenAMBULATORY NON FORMULARY MEDICATION Medication Name: incentive spirometry  Use as directed  . benzonatate (TESSALON) 100 MG capsule Take 1 capsule by mouth 3 (three) times daily as needed for cough.   . Blood Glucose Monitoring Suppl (ONE TOUCH ULTRA 2) W/DEVICE KIT   . cetirizine (ZYRTEC ALLERGY) 10 MG tablet Take 1 tablet (10 mg total) by mouth daily.  . cyclobenzaprine (FLEXERIL) 10 MG tablet Take 1 tablet (10 mg total) by mouth 3 (three) times daily as needed for muscle spasms.  . folic acid (FOLVITE) 1 MG tablet Take 1 mg by mouth daily.  .Marland Kitchengabapentin (NEURONTIN) 100 MG capsule Take 100 mg by mouth 3 (three) times daily.  .Marland KitchenGLUCAGON EMERGENCY 1 MG injection Inject 1 mg into the muscle once as needed.   . hydroxychloroquine (PLAQUENIL) 200 MG tablet Take 200 mg by mouth daily.  .Marland Kitchenipratropium (ATROVENT) 0.03 % nasal spray Place 2 sprays into the nose 4 (four) times daily.  . meloxicam (MOBIC) 7.5 MG tablet Take 7.5 mg by mouth daily as needed for pain.  . metFORMIN (GLUCOPHAGE) 500 MG tablet Take 1,000 mg by mouth 2 (two) times daily with a meal.  . montelukast (SINGULAIR) 10 MG tablet Take 1 tablet (10 mg total) by mouth at bedtime. (Patient taking  differently: Take 10 mg by mouth at bedtime as needed. )  . pravastatin (PRAVACHOL) 40 MG tablet Take 40 mg by mouth daily.  .Marland KitchenRespiratory Therapy Supplies (FLUTTER) DEVI 1 each by Does not apply route once.  . vitamin B-12 (CYANOCOBALAMIN) 1000 MCG tablet Take 1,000 mcg by mouth 2 (two) times daily.  .Jearl KlinefelterELLIPTA 62.5-25 MCG/INH AEPB INHALE 1 PUFF INTO THE LUNGS DAILY (Patient not taking: Reported on 01/17/2016)   No facility-administered encounter medications on file as of 01/17/2016.     Orders for this visit: No orders of the defined types were placed in this encounter.   Thank  you for the visitation and for allowing  [facility] to assist in the care of your patient. Our recommendations are noted above.  Please contact [place] we can be of further  service.  Vilinda Boehringer, MD [facility] Office Number: [phone]

## 2016-01-17 NOTE — Assessment & Plan Note (Signed)
Possible vocal cord irritation/injury after cervical stenosis surgery in 02/2015, had ENT and speech follow up, they recommend stopping dry powders. Discussed with patient, we will stop Anoro.  If cough and SOB return, then we can consider starting [place] with current allergy regiment - cont with nasal saline rinse - acapella valve - may stop if worsening voice issues - ICS daily (10-15 times per day) - we will stop Anoro, if cough and sob return, then start Spiriva Respimat

## 2016-01-17 NOTE — Assessment & Plan Note (Signed)
CT scan reviewed with patient. RUL opacity seen and discussed witt the patient.  New 50mm RUL opacity, will plan for follow up CT at next visit.   mild persistent cough. Mild cough today, mostly allergy/irriattion related.   Plan: -Continue with over-the-counter allergy regiment, and nasal sinus rinse -Incentive spirometer 10-15 times per day  -Routine surveillance chest CT prior to follow-up visit

## 2016-03-01 DIAGNOSIS — M7540 Impingement syndrome of unspecified shoulder: Secondary | ICD-10-CM | POA: Insufficient documentation

## 2016-03-30 ENCOUNTER — Other Ambulatory Visit: Payer: Self-pay | Admitting: Specialist

## 2016-03-30 DIAGNOSIS — M7541 Impingement syndrome of right shoulder: Secondary | ICD-10-CM

## 2016-04-10 ENCOUNTER — Ambulatory Visit
Admission: RE | Admit: 2016-04-10 | Discharge: 2016-04-10 | Disposition: A | Discharge status: Home / Self Care | Payer: Medicare Other | Source: Ambulatory Visit | Attending: Specialist | Admitting: Specialist

## 2016-04-10 ENCOUNTER — Encounter: Payer: Self-pay | Admitting: *Deleted

## 2016-04-10 DIAGNOSIS — M19011 Primary osteoarthritis, right shoulder: Secondary | ICD-10-CM | POA: Diagnosis not present

## 2016-04-10 DIAGNOSIS — M7541 Impingement syndrome of right shoulder: Secondary | ICD-10-CM | POA: Diagnosis present

## 2016-04-10 DIAGNOSIS — X58XXXA Exposure to other specified factors, initial encounter: Secondary | ICD-10-CM | POA: Diagnosis not present

## 2016-04-10 DIAGNOSIS — S46111A Strain of muscle, fascia and tendon of long head of biceps, right arm, initial encounter: Secondary | ICD-10-CM | POA: Diagnosis not present

## 2016-04-10 DIAGNOSIS — M62511 Muscle wasting and atrophy, not elsewhere classified, right shoulder: Secondary | ICD-10-CM | POA: Insufficient documentation

## 2016-04-10 DIAGNOSIS — M75121 Complete rotator cuff tear or rupture of right shoulder, not specified as traumatic: Secondary | ICD-10-CM | POA: Diagnosis not present

## 2016-04-23 ENCOUNTER — Other Ambulatory Visit: Payer: Self-pay

## 2016-04-23 DIAGNOSIS — J189 Pneumonia, unspecified organism: Secondary | ICD-10-CM

## 2016-04-29 IMAGING — MG MM DIGITAL SCREENING BILAT W/ CAD
1 series · 4 of 4 positions shown · non-contrast
Comparison: Previous Exam(s)

CLINICAL DATA: Screening.

EXAM:
DIGITAL SCREENING BILATERAL MAMMOGRAM WITH CAD

[R CC · right · 4 of 4 slices shown]
[im 1/4]
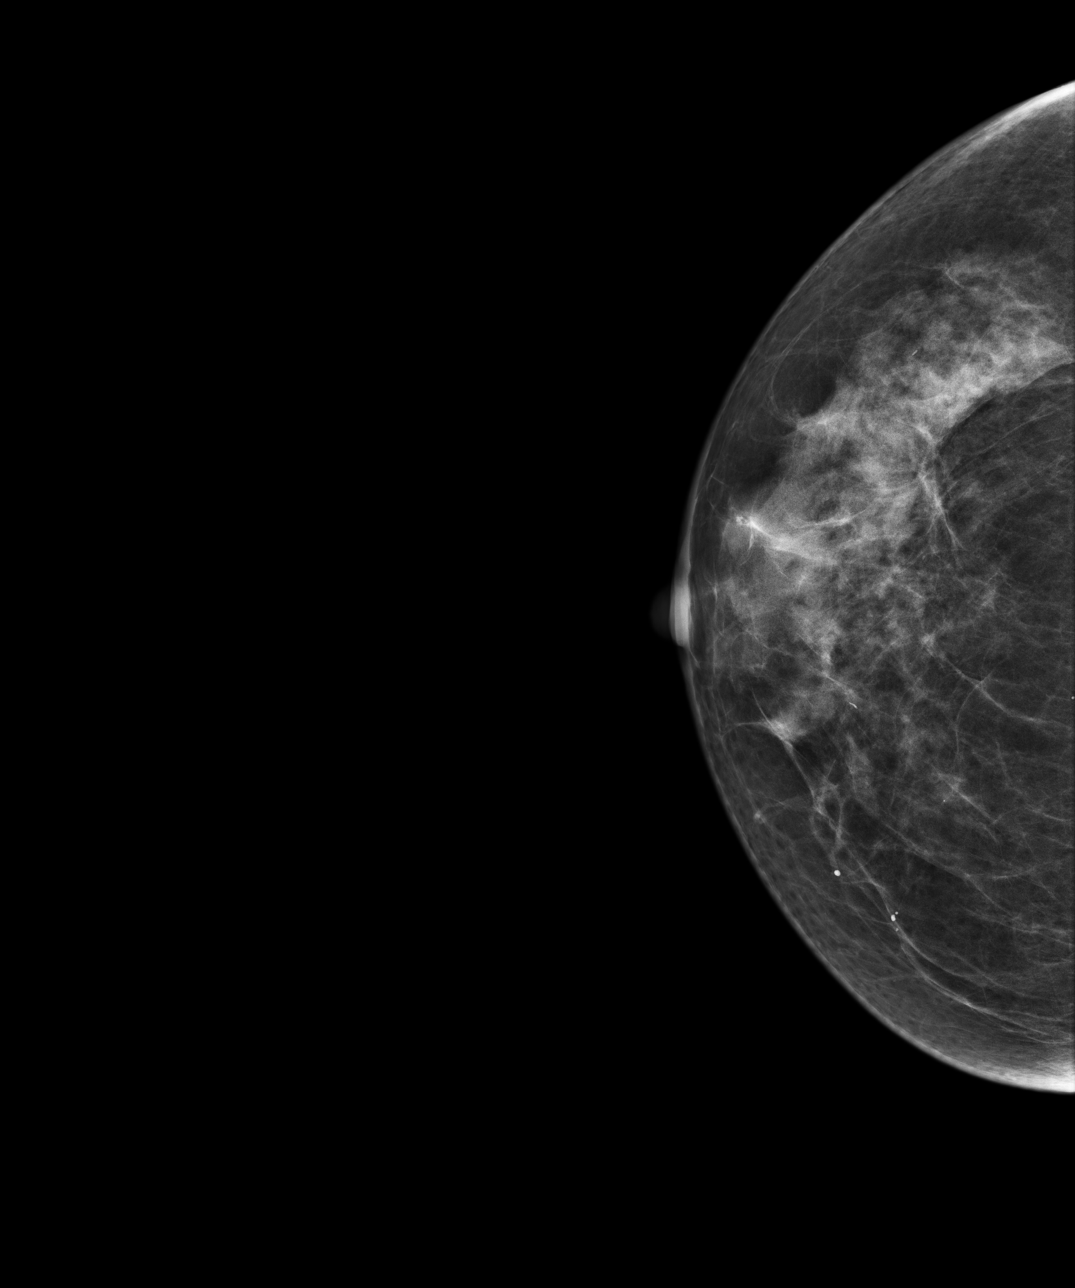
[im 2/4]
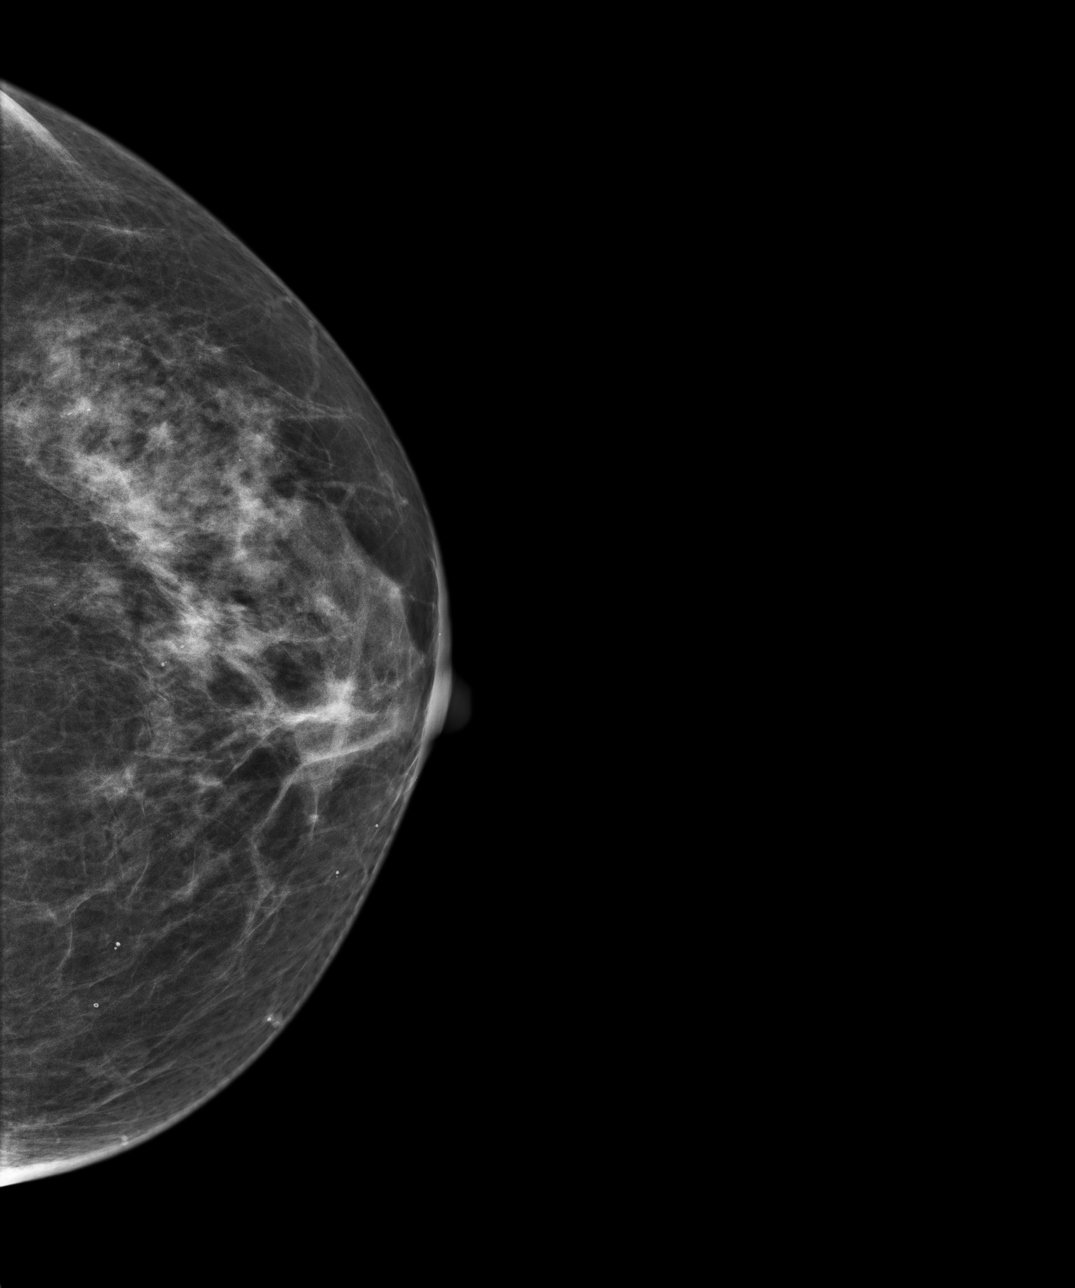
[im 3/4]
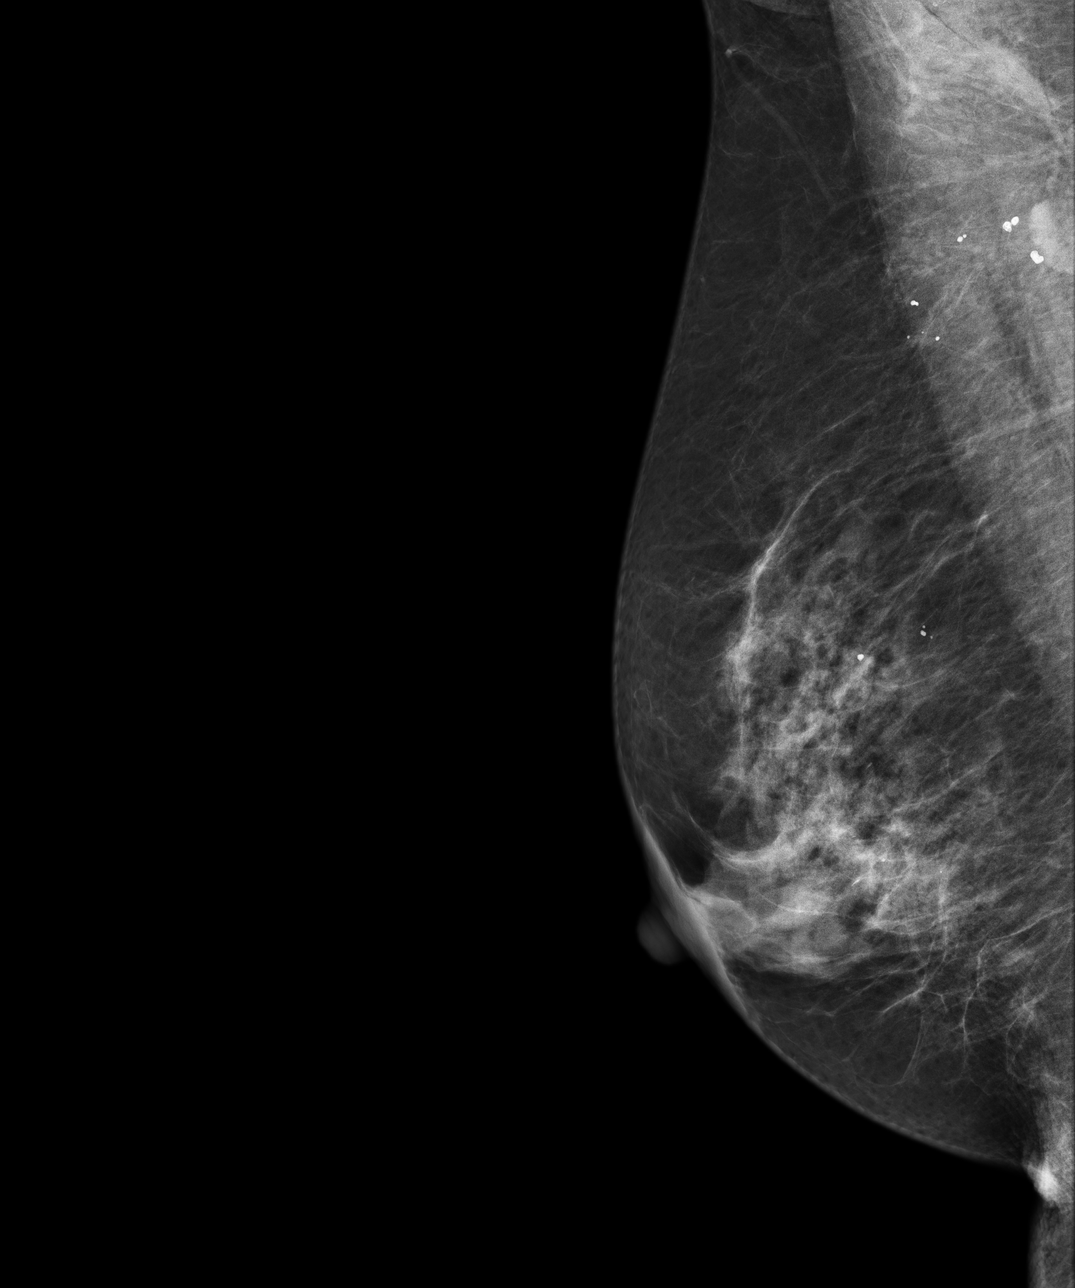
[im 4/4]
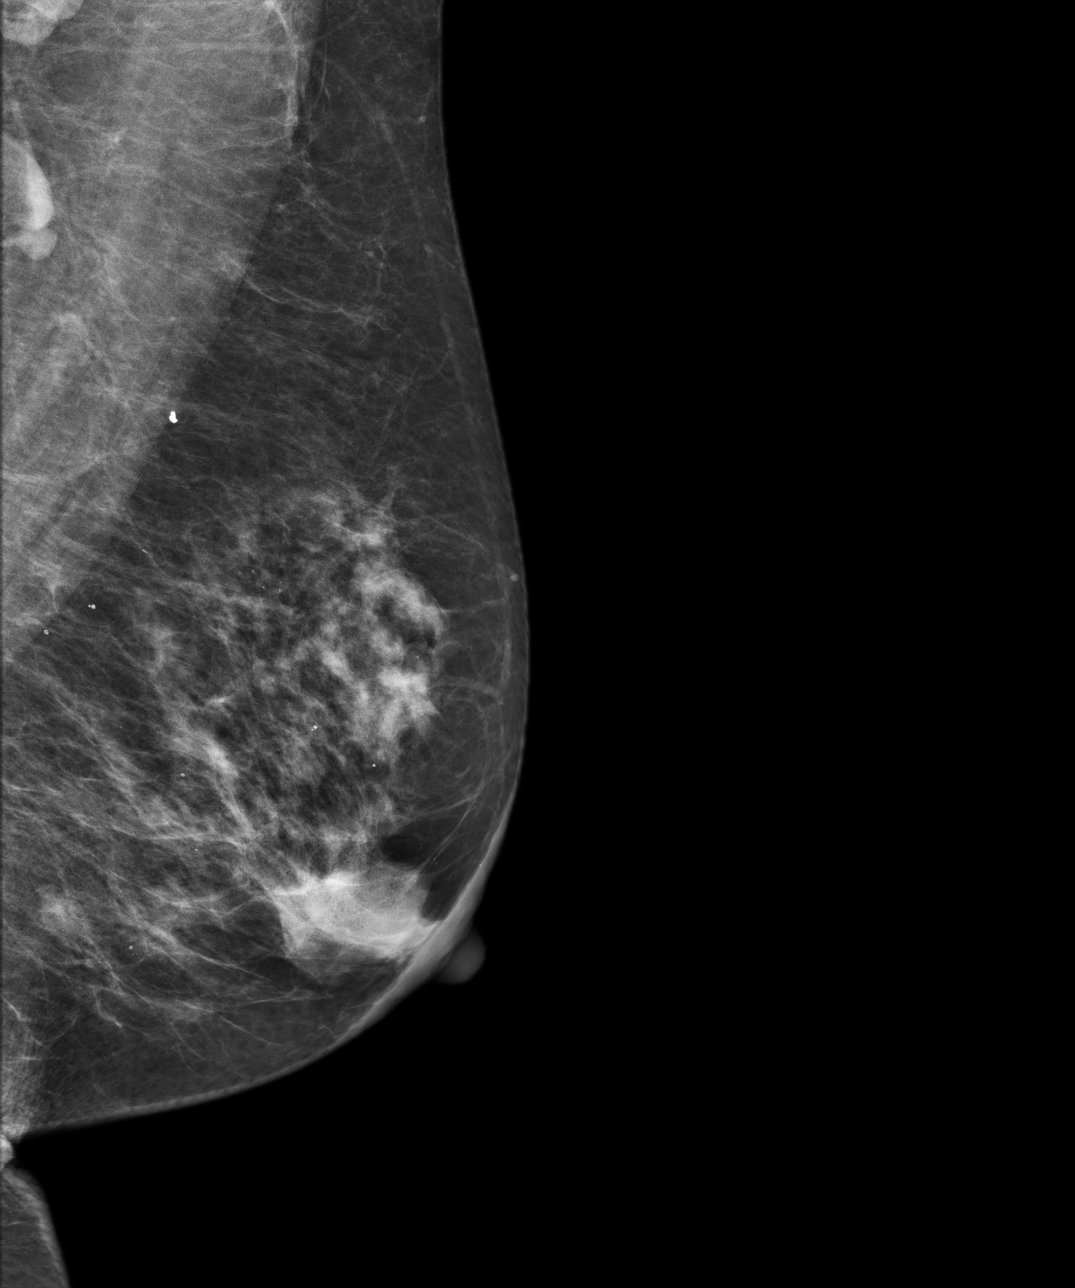

[4 of 4 positions shown; findings below may reference images not displayed]

ACR Breast Density Category c: The breast tissue is heterogeneously
dense, which may obscure small masses.
FINDINGS: In the left breast, calcifications warrant further evaluation with
magnified views. In the right breast, no findings suspicious for
malignancy. Images were processed with CAD.
IMPRESSION: Further evaluation is suggested for calcifications in the left
breast.

RECOMMENDATION:
Diagnostic mammogram of the left breast. (Code:[id number])

The patient will be contacted regarding the findings, and additional
imaging will be scheduled.

BI-RADS CATEGORY  0: Incomplete. Need additional imaging evaluation
and/or prior mammograms for comparison.

## 2016-05-11 IMAGING — MG MM ADDITIONAL VIEWS AT NO CHARGE
2 series · 4 of 4 positions shown · non-contrast
Comparison: Multiple prior studies including 08/13/2013

CLINICAL DATA: Further evaluation of left breast calcification

EXAM:
DIGITAL DIAGNOSTIC  LEFT MAMMOGRAM

[L CC · left · 3 of 3 slices shown]
[im 1/3]
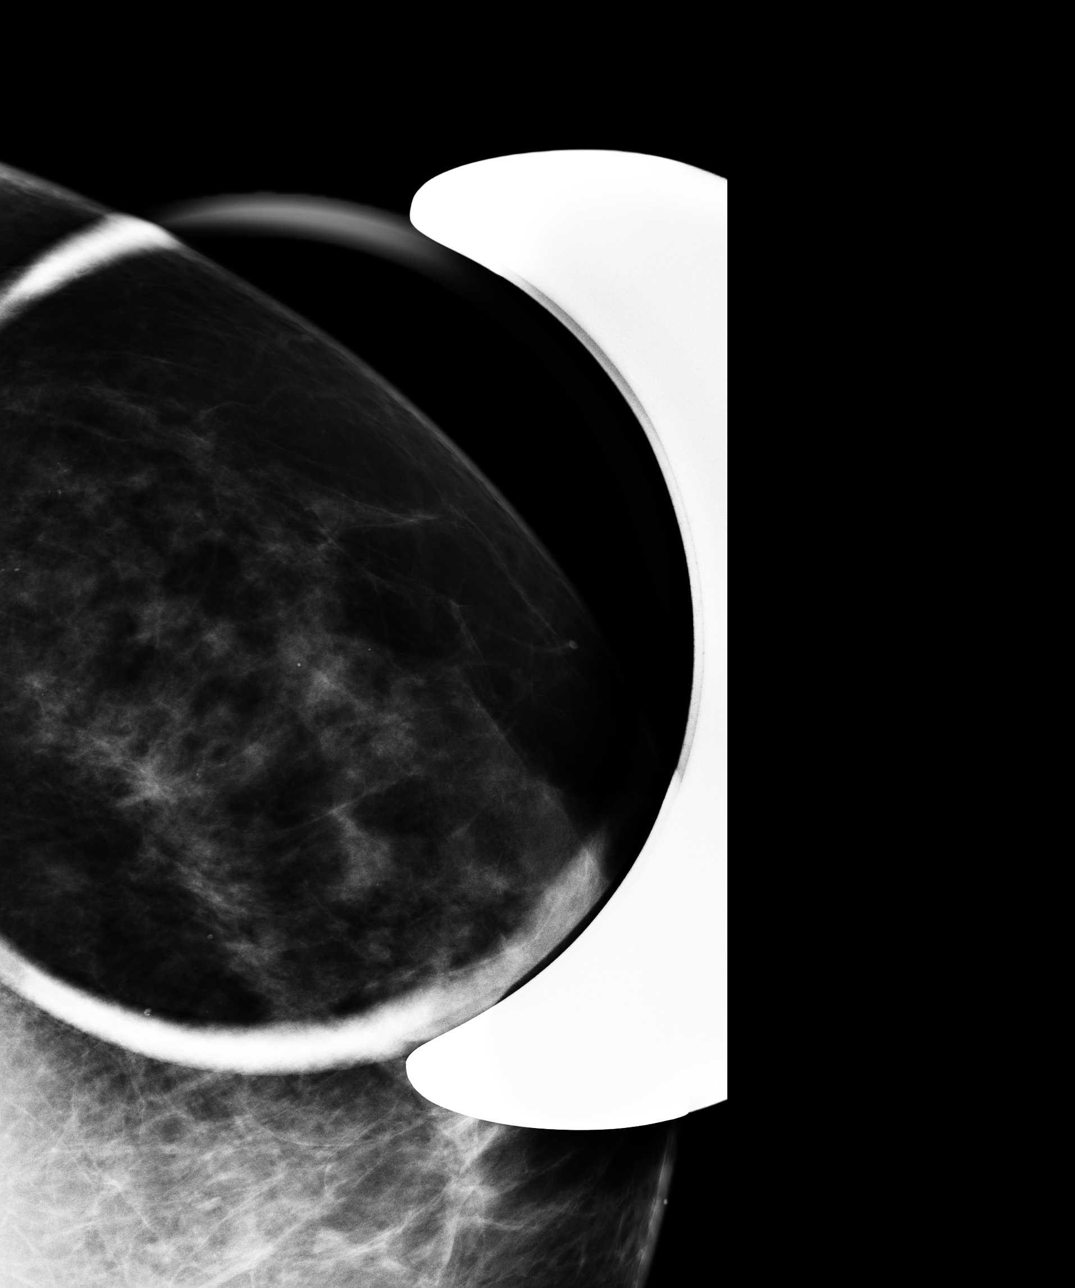
[im 2/3]
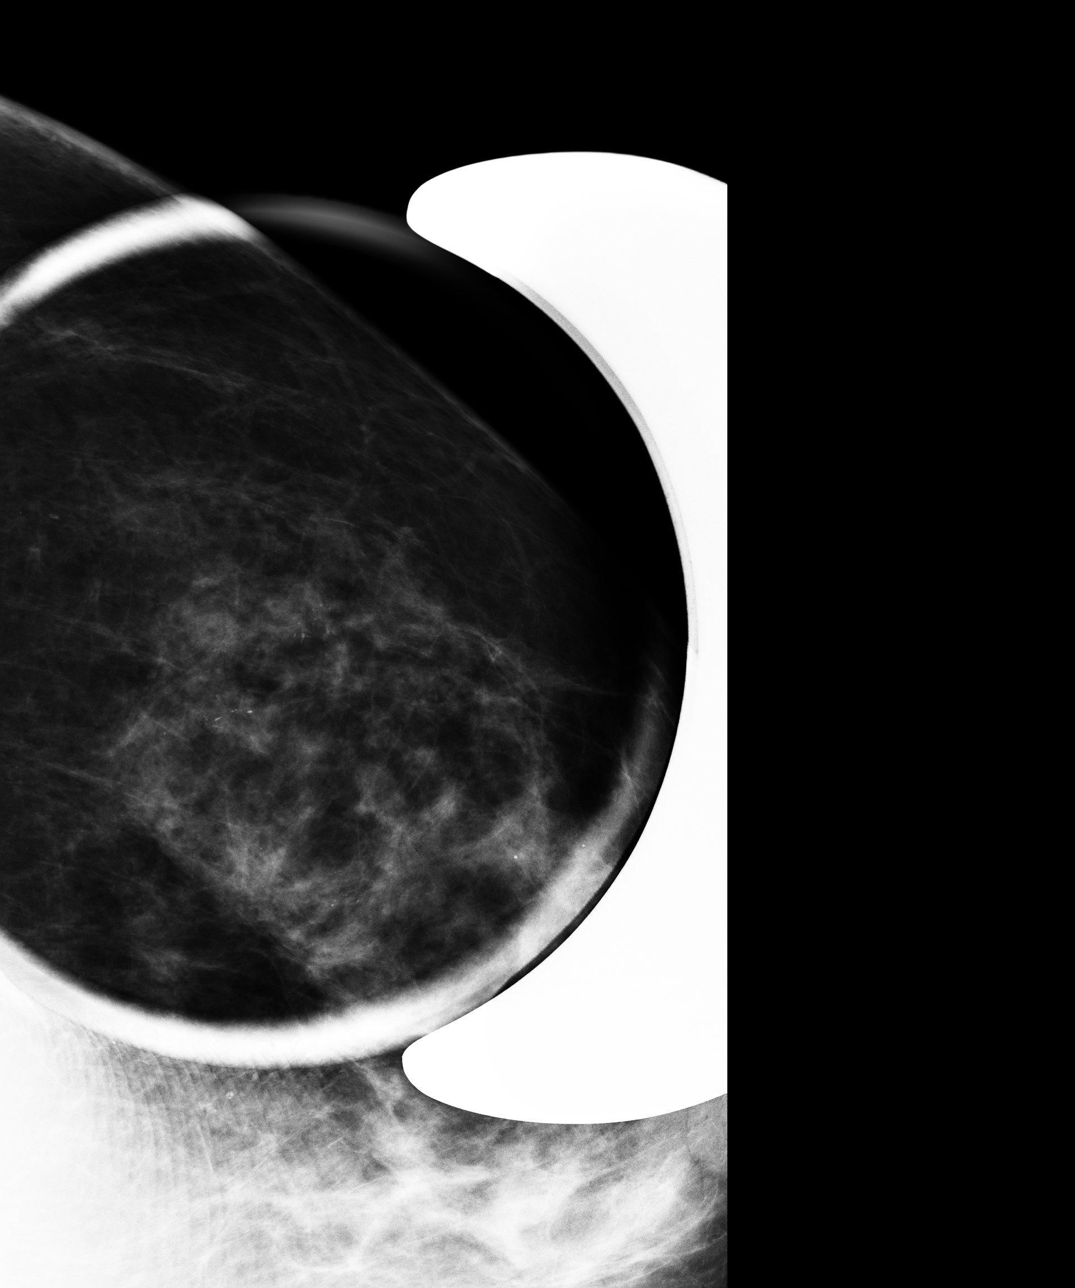
[im 3/3]
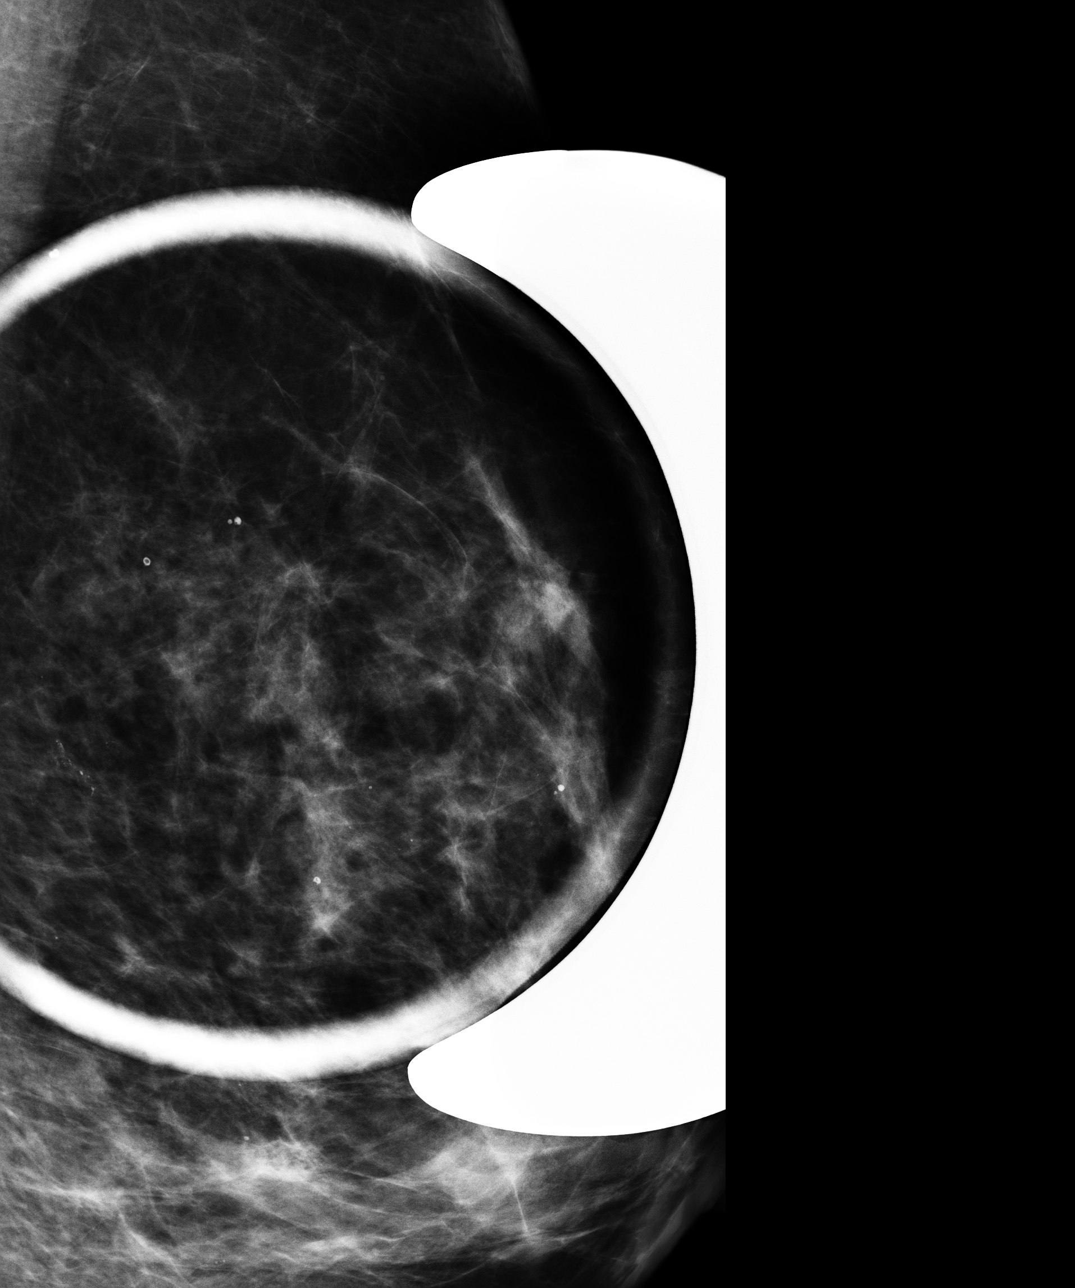

[L MLO]
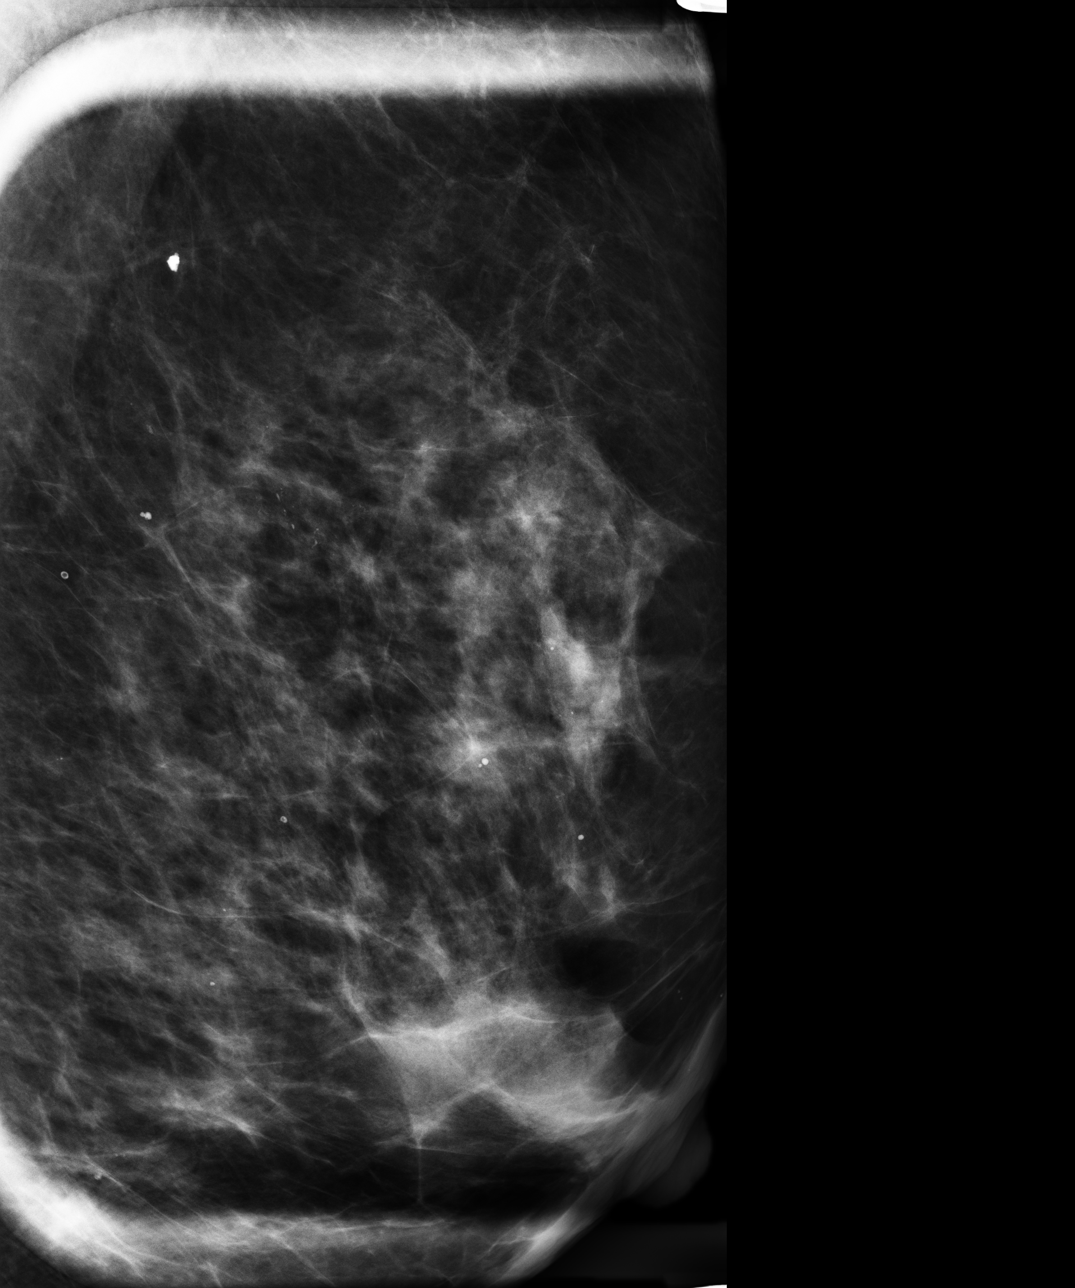

[4 of 4 positions shown; findings below may reference images not displayed]

ACR Breast Density Category c: The breast tissue is heterogeneously
dense, which may obscure small masses.
FINDINGS: At the middle third depth in the upper-outer quadrant of the left
breast, there are clustered mildly pleomorphic calcifications
measuring 7 mm in maximal diameter. There are 2 adjacent clusters
separated by about 7 mm.
IMPRESSION: Two closely adjacent clusters of suspicious calcifications

RECOMMENDATION:
Recommend stereotactic core needle biopsy of both adjacent clusters
of calcifications.

I have discussed the findings and recommendations with the patient.
Results were also provided in writing at the conclusion of the
visit. The patient's physician will be contacted to obtain an order
for this procedure.

BI-RADS CATEGORY  4: Suspicious.

## 2016-05-17 ENCOUNTER — Ambulatory Visit
Admission: RE | Admit: 2016-05-17 | Discharge: 2016-05-17 | Disposition: A | Discharge status: Home / Self Care | Payer: Medicare Other | Source: Ambulatory Visit | Attending: Internal Medicine | Admitting: Internal Medicine

## 2016-05-17 DIAGNOSIS — J189 Pneumonia, unspecified organism: Secondary | ICD-10-CM | POA: Diagnosis present

## 2016-05-17 DIAGNOSIS — I700 Atherosclerosis of aorta: Secondary | ICD-10-CM | POA: Insufficient documentation

## 2016-05-17 DIAGNOSIS — I2510 Atherosclerotic heart disease of native coronary artery without angina pectoris: Secondary | ICD-10-CM | POA: Insufficient documentation

## 2016-05-17 DIAGNOSIS — N6489 Other specified disorders of breast: Secondary | ICD-10-CM | POA: Insufficient documentation

## 2016-05-17 IMAGING — CR MM BREAST BX W LOC DEV 1ST LESION IMAGE BX SPEC STEREO GUIDE
5 of 6 series · 6 of 8 positions shown · non-contrast
Comparison: Previous exams.

ADDENDUM:
Addendum by Dr. Eligius Yusufu on 09/03/2013 at [DATE] a.m.. Pathology
demonstrates atypical ductal and atypical lobular hyperplasia at the
site of biopsy left upper outer quadrant calcifications, which is
concordant. Surgical excision is recommended. Breast MRI should be
considered preoperatively. The office of Dr. Td Loo arrange for
surgical consultation. The patient reports no problems at the biopsy
site overnight and all questions were answered.
CLINICAL DATA: Patient presents for stereotactic guided core biopsy
of calcifications in the upper-outer quadrant of the left breast.

EXAM:
LEFT BREAST STEREOTACTIC CORE NEEDLE BIOPSY

[LM (1 of 5)]
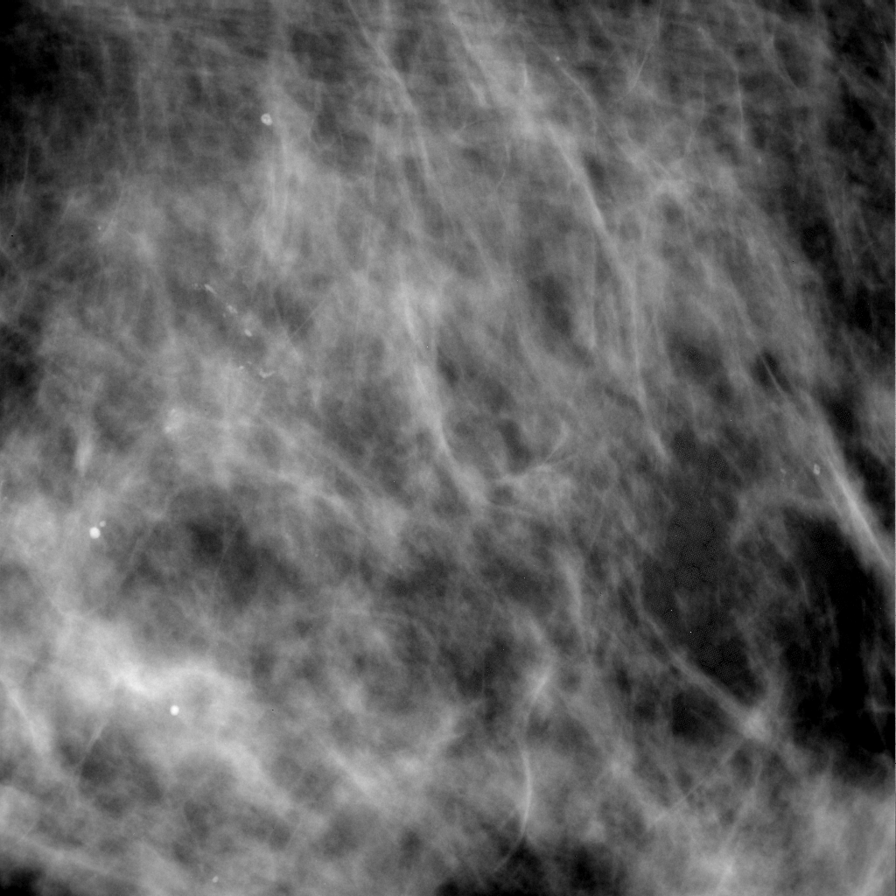

[LM (2 of 5)]
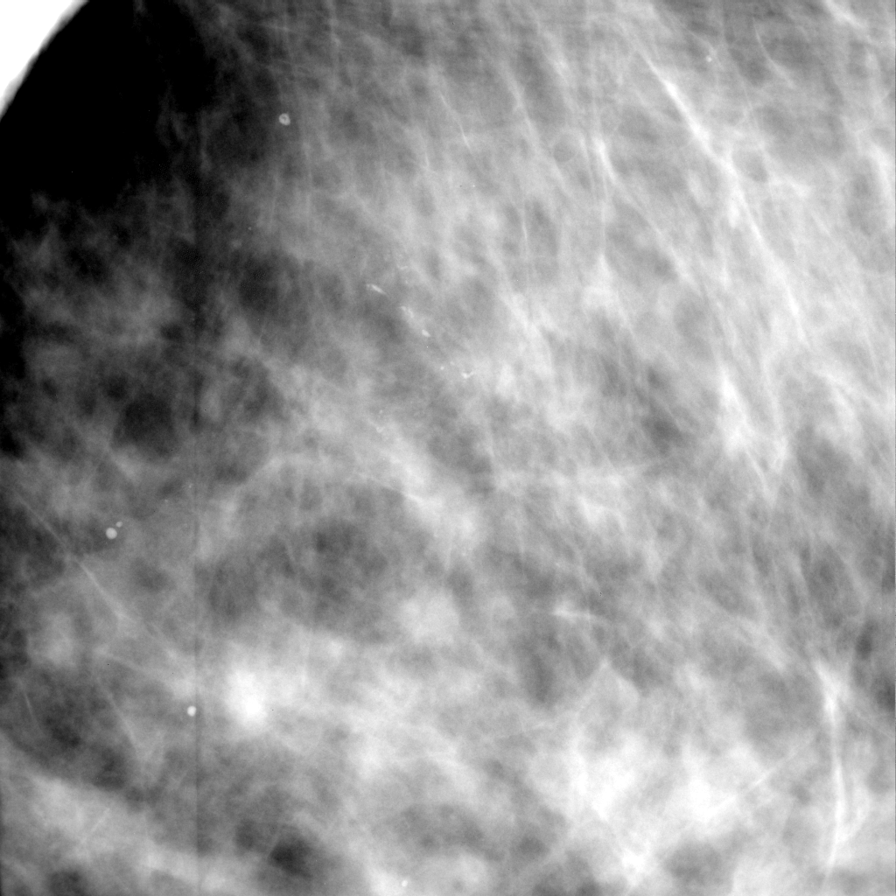

[LM (3 of 5)]
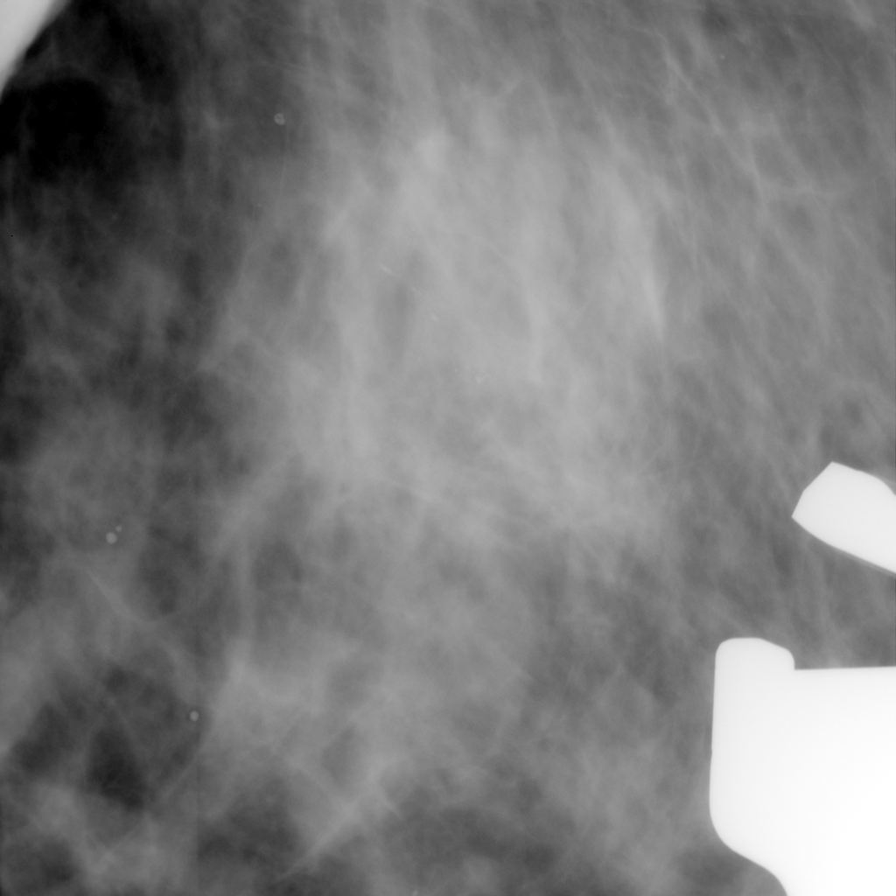

[Series 3: LM · left · 2 of 2 slices shown (4 of 5)]
[im 1/2]
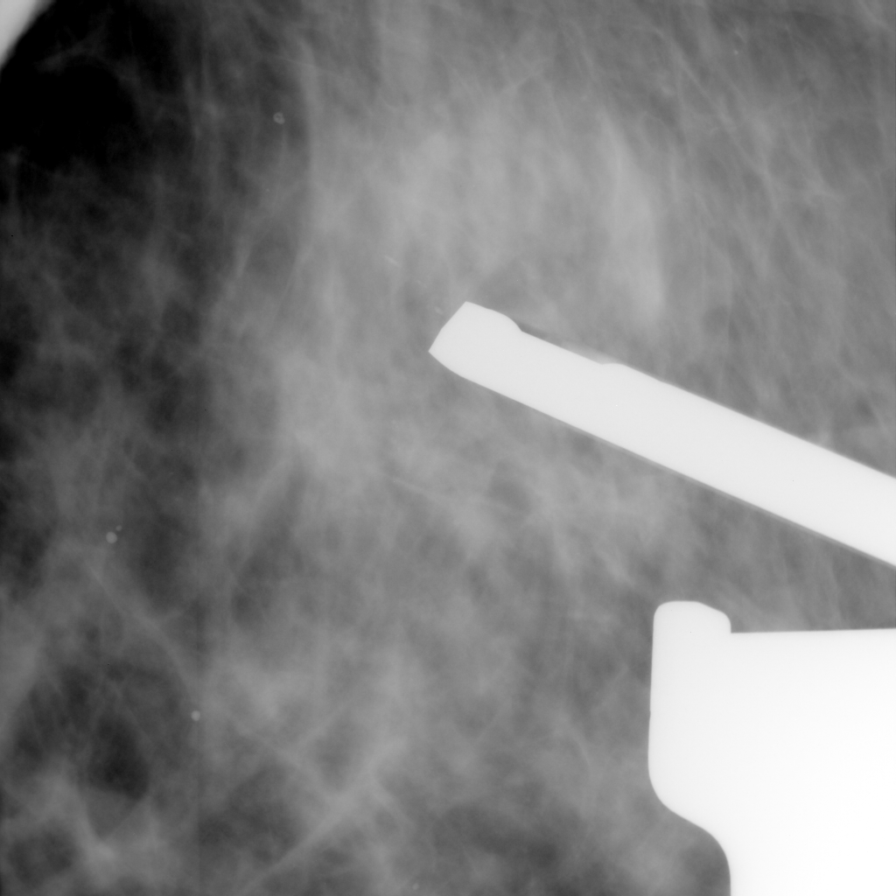
[im 2/2]
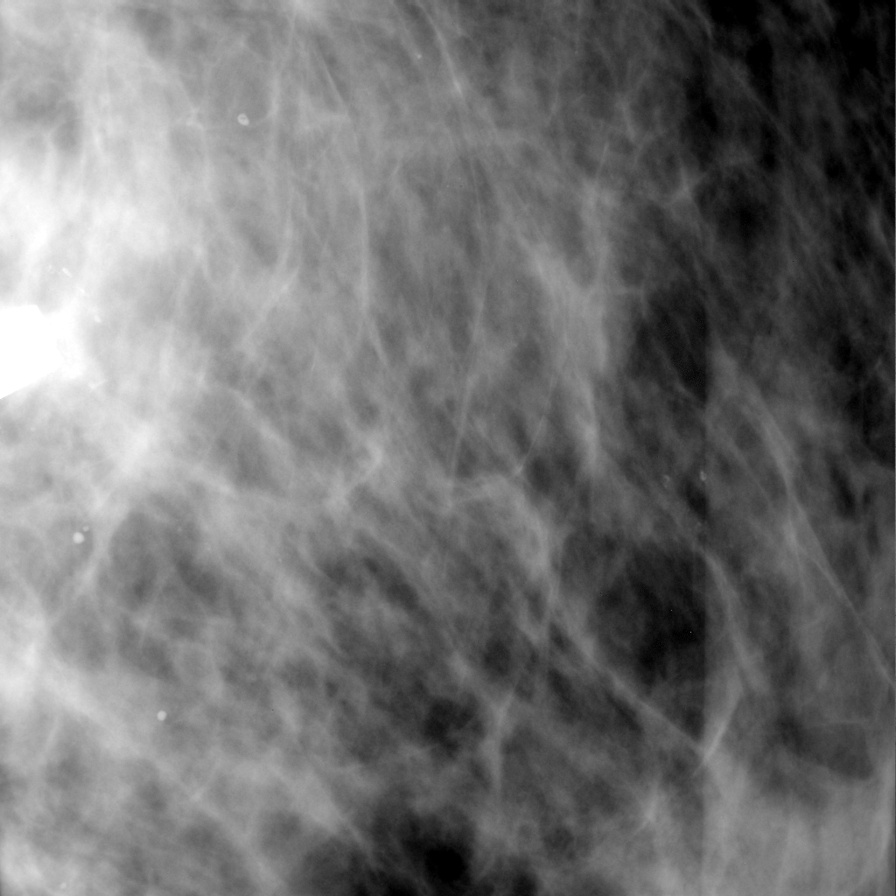

[LM (5 of 5)]
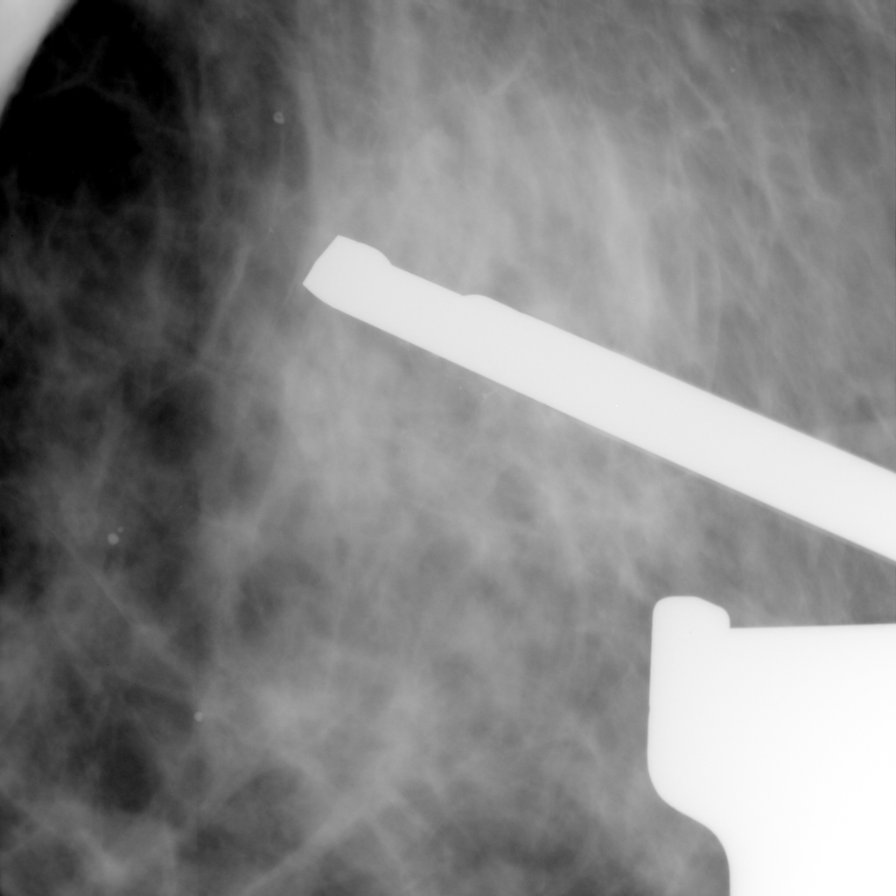

[6 of 8 positions shown; findings below may reference images not displayed]



Using sterile technique and 2% Lidocaine as local anesthetic, under
stereotactic guidance, a 9 gauge vacuum assisted device was used to
perform core needle biopsy of calcifications in the upper-outer
quadrant of the left breast using a lateral approach. Specimen
radiograph was performed showing calcifications to be for. Specimens
with calcifications are identified for pathology.

At the conclusion of the procedure, a T-shaped tissue marker clip
was deployed into the biopsy cavity. Follow-up 2-view mammogram
confirmed clip in expected location.
IMPRESSION: Stereotactic-guided biopsy of left breast calcifications. No
apparent complications.

## 2016-05-21 ENCOUNTER — Ambulatory Visit (INDEPENDENT_AMBULATORY_CARE_PROVIDER_SITE_OTHER): Payer: Medicare Other | Admitting: Internal Medicine

## 2016-05-21 ENCOUNTER — Encounter: Payer: Self-pay | Admitting: Internal Medicine

## 2016-05-21 VITALS — BP 128/70 | HR 73 | Wt 127.0 lb

## 2016-05-21 DIAGNOSIS — R05 Cough: Secondary | ICD-10-CM

## 2016-05-21 DIAGNOSIS — R059 Cough, unspecified: Secondary | ICD-10-CM

## 2016-05-21 NOTE — Progress Notes (Signed)
[facility] Consultation      MRN# [id number] Ashley Bray 01-Aug-1940   CC: Chief Complaint  Patient presents with  . Follow-up    prev VM pt: congestion: CT results   August 2015, found to have consolidation groundglass opacities repeat CT March 2016, history of left breast cancer status post radiation.. Bronchoscopy with BAL showed no atypical organisms and all cultures were negative. 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 Has some post-radiation fibrosis.    Events since last clinic visit: Follow up CT chest 05/17/16 -shows resolution of RUL nodule, no other infiltrates seen I have Independently reviewed images of CT scan 05/21/2016 NL scan  Patient now has acute cough and bronchitis Currently she is doing well, has a slight nonproductive cough which is now baseline for her, and some mild shortness of breath, however is able to perform daily activities and participate in walking events without any significant dyspnea. Patient on prednisone and amox at this time -feels better with meds   Medication:    Current Outpatient Prescriptions:  .  acetaminophen (TYLENOL) 500 MG tablet, Take 500 mg by mouth every 8 (eight) hours as needed for mild pain or moderate pain., Disp: , Rfl:  .  AMBULATORY NON FORMULARY MEDICATION, Medication Name: incentive spirometry  Use as directed, Disp: 1 each, Rfl: 0 .  ANORO ELLIPTA 62.5-25 MCG/INH AEPB, INHALE 1 PUFF INTO THE LUNGS DAILY, Disp: 60 each, Rfl: 5 .  benzonatate (TESSALON) 100 MG capsule, Take 1 capsule by mouth 3 (three) times daily as needed for cough. , Disp: , Rfl: 0 .  Blood Glucose Monitoring Suppl (ONE TOUCH ULTRA 2) W/DEVICE KIT, , Disp: , Rfl: 11 .  cetirizine (ZYRTEC ALLERGY) 10 MG tablet, Take 1 tablet (10 mg total) by mouth daily., Disp: 30 tablet, Rfl: 12 .  cyclobenzaprine (FLEXERIL) 10 MG tablet, Take 1 tablet (10  mg total) by mouth 3 (three) times daily as needed for muscle spasms., Disp: 30 tablet, Rfl: 0 .  fluticasone (FLONASE) 50 MCG/ACT nasal spray, Place 1 spray into both nostrils 2 (two) times daily., Disp: , Rfl:  .  folic acid (FOLVITE) 1 MG tablet, Take 1 mg by mouth daily., Disp: , Rfl: 11 .  gabapentin (NEURONTIN) 100 MG capsule, Take 100 mg by mouth 3 (three) times daily., Disp: , Rfl:  .  GLUCAGON EMERGENCY 1 MG injection, Inject 1 mg into the muscle once as needed. , Disp: , Rfl: 0 .  hydroxychloroquine (PLAQUENIL) 200 MG tablet, Take 200 mg by mouth daily., Disp: , Rfl:  .  metFORMIN (GLUCOPHAGE) 500 MG tablet, Take 1,000 mg by mouth 2 (two) times daily with a meal., Disp: , Rfl:  .  montelukast (SINGULAIR) 10 MG tablet, Take 1 tablet (10 mg total) by mouth at bedtime. (Patient taking differently: Take 10 mg by mouth at bedtime as needed. ), Disp: 30 tablet, Rfl: 1 .  pravastatin (PRAVACHOL) 40 MG tablet, Take 40 mg by mouth daily., Disp: , Rfl:  .  Respiratory Therapy Supplies (FLUTTER) DEVI, 1 each by Does not apply route once., Disp: 1 each, Rfl: 0 .  vitamin B-12 (CYANOCOBALAMIN) 1000 MCG tablet, Take 1,000 mcg by mouth 2 (two) times daily., Disp: , Rfl:     Review of Systems  Constitutional: Negative for chills, fever and weight loss.  HENT: Negative for congestion and ear discharge.        Mild nasal drainage.  Eyes: Negative.  Negative for blurred vision.  Respiratory: Positive for cough and sputum production. Negative for shortness of breath.        Mild cough - more clearing of the throat  Cardiovascular: Negative for chest pain and palpitations.  Gastrointestinal: Negative for heartburn, nausea and vomiting.  Genitourinary: Negative.   Skin: Negative for itching and rash.  Neurological: Negative for tingling and headaches.  Endo/Heme/Allergies: Negative.   Psychiatric/Behavioral: Negative.       Allergies:  Codeine  Physical Examination:  VS: BP 128/70 (BP  Location: Right Arm, Cuff Size: Normal)   Pulse 73   Wt 127 lb (57.6 kg)   SpO2 99%   BMI 23.23 kg/m   General Appearance: No distress  HEENT: PERRLA, no ptosis, no other lesions noticed Pulmonary:normal breath sounds., diaphragmatic excursion normal.No wheezing, No rales   Cardiovascular:  Normal S1,S2.  No m/r/g.     Abdomen:Exam: Benign, Soft, non-tender, No masses  Skin:   warm, no rashes, no ecchymosis  Extremities: normal, no cyanosis, clubbing, warm with normal capillary refill.         Medication:    Current Outpatient Prescriptions:  .  acetaminophen (TYLENOL) 500 MG tablet, Take 500 mg by mouth every 8 (eight) hours as needed for mild pain or moderate pain., Disp: , Rfl:  .  AMBULATORY NON FORMULARY MEDICATION, Medication Name: incentive spirometry  Use as directed, Disp: 1 each, Rfl: 0 .  ANORO ELLIPTA 62.5-25 MCG/INH AEPB, INHALE 1 PUFF INTO THE LUNGS DAILY, Disp: 60 each, Rfl: 5 .  benzonatate (TESSALON) 100 MG capsule, Take 1 capsule by mouth 3 (three) times daily as needed for cough. , Disp: , Rfl: 0 .  Blood Glucose Monitoring Suppl (ONE TOUCH ULTRA 2) W/DEVICE KIT, , Disp: , Rfl: 11 .  cetirizine (ZYRTEC ALLERGY) 10 MG tablet, Take 1 tablet (10 mg total) by mouth daily., Disp: 30 tablet, Rfl: 12 .  cyclobenzaprine (FLEXERIL) 10 MG tablet, Take 1 tablet (10 mg total) by mouth 3 (three) times daily as needed for muscle spasms., Disp: 30 tablet, Rfl: 0 .  fluticasone (FLONASE) 50 MCG/ACT nasal spray, Place 1 spray into both nostrils 2 (two) times daily., Disp: , Rfl:  .  folic acid (FOLVITE) 1 MG tablet, Take 1 mg by mouth daily., Disp: , Rfl: 11 .  gabapentin (NEURONTIN) 100 MG capsule, Take 100 mg by mouth 3 (three) times daily., Disp: , Rfl:  .  GLUCAGON EMERGENCY 1 MG injection, Inject 1 mg into the muscle once as needed. , Disp: , Rfl: 0 .  hydroxychloroquine (PLAQUENIL) 200 MG tablet, Take 200 mg by mouth daily., Disp: , Rfl:  .  metFORMIN (GLUCOPHAGE) 500 MG  tablet, Take 1,000 mg by mouth 2 (two) times daily with a meal., Disp: , Rfl:  .  montelukast (SINGULAIR) 10 MG tablet, Take 1 tablet (10 mg total) by mouth at bedtime. (Patient taking differently: Take 10 mg by mouth at bedtime as needed. ), Disp: 30 tablet, Rfl: 1 .  pravastatin (PRAVACHOL) 40 MG tablet, Take 40 mg by mouth daily., Disp: , Rfl:  .  Respiratory Therapy Supplies (FLUTTER) DEVI, 1 each by Does not apply route once., Disp: 1 each, Rfl: 0 .  vitamin B-12 (CYANOCOBALAMIN) 1000 MCG tablet, Take 1,000 mcg by mouth 2 (two) times daily., Disp: , Rfl:     Review of Systems  Constitutional: Negative for chills, fever and weight loss.  HENT: Negative for congestion and ear discharge.  Mild nasal drainage.   Eyes: Negative.  Negative for blurred vision.  Respiratory: Positive for cough. Negative for sputum production and shortness of breath.        Mild cough - more clearing of the throat  Cardiovascular: Negative for chest pain and palpitations.  Gastrointestinal: Negative for heartburn, nausea and vomiting.  Genitourinary: Negative.   Skin: Negative for itching and rash.  Neurological: Positive for tingling. Negative for headaches.  Endo/Heme/Allergies: Negative.   Psychiatric/Behavioral: Negative.       Allergies:  Codeine  Physical Examination:  VS: BP 128/70 (BP Location: Right Arm, Cuff Size: Normal)   Pulse 73   Wt 127 lb (57.6 kg)   SpO2 99%   BMI 23.23 kg/m   General Appearance: No distress  HEENT: PERRLA, no ptosis, no other lesions noticed Pulmonary:normal breath sounds., diaphragmatic excursion normal.No wheezing, No rales   Cardiovascular:  Normal S1,S2.  No m/r/g.     Abdomen:Exam: Benign, Soft, non-tender, No masses  Skin:   warm, no rashes, no ecchymosis  Extremities: normal, no cyanosis, clubbing, warm with normal capillary refill.      Assessment and Plan: 76 year old female history of left-sided breast cancer status post chemotherapy,  radiation, mastectomy, now with chronic cough, allergic rhinitis, h/o radiation pneumonitis, and intermittent shortness of breath. Patient to undergo shoulder surgery  RUL opacity-resolved Image reviewed. Hx of L breast Ca -no further CT scans at this time   Cough Possible vocal cord irritation/injury after cervical stenosis surgery in 02/2015, had ENT and speech follow up, they recommend stopping dry powders. - cont with current allergy regiment - cont with nasal saline rinse - acapella valve - may stop if worsening voice issues - ICS daily (10-15 times per day)   ACUTE BRONCHITIS -continue prednisone and amox as prescribed  Preoperative Pulmonary Risk Assessment Patient has Mild Risk for Post op Pulmonary Complications   General Risk Reduction Strategies: - All patients warrant post-operative incentive spirometry.  - Early ambulation, PT/OT - DVT prophylaxis where appropriate - Adequate pain control without oversedation    Patient  satisfied with Plan of action and management. All questions answered  Corrin Parker, M.D.  [facility]  Medical Director [place] Director [facility] Cardio-Pulmonary Department

## 2016-05-21 NOTE — Patient Instructions (Addendum)
Follow up 6 months No CT scan needed at this time

## 2016-06-01 ENCOUNTER — Encounter: Payer: Self-pay | Admitting: Oncology

## 2016-06-01 ENCOUNTER — Inpatient Hospital Stay (HOSPITAL_BASED_OUTPATIENT_CLINIC_OR_DEPARTMENT_OTHER): Payer: Medicare Other | Admitting: Oncology

## 2016-06-01 ENCOUNTER — Inpatient Hospital Stay: Payer: Medicare Other | Attending: Oncology

## 2016-06-01 VITALS — BP 152/77 | HR 80 | Temp 98.2°F | Resp 18 | Wt 129.4 lb

## 2016-06-01 DIAGNOSIS — I10 Essential (primary) hypertension: Secondary | ICD-10-CM

## 2016-06-01 DIAGNOSIS — E119 Type 2 diabetes mellitus without complications: Secondary | ICD-10-CM | POA: Diagnosis not present

## 2016-06-01 DIAGNOSIS — I700 Atherosclerosis of aorta: Secondary | ICD-10-CM | POA: Insufficient documentation

## 2016-06-01 DIAGNOSIS — M069 Rheumatoid arthritis, unspecified: Secondary | ICD-10-CM | POA: Insufficient documentation

## 2016-06-01 DIAGNOSIS — D0512 Intraductal carcinoma in situ of left breast: Secondary | ICD-10-CM | POA: Diagnosis not present

## 2016-06-01 DIAGNOSIS — Z79899 Other long term (current) drug therapy: Secondary | ICD-10-CM

## 2016-06-01 DIAGNOSIS — Z923 Personal history of irradiation: Secondary | ICD-10-CM | POA: Insufficient documentation

## 2016-06-01 DIAGNOSIS — Z7984 Long term (current) use of oral hypoglycemic drugs: Secondary | ICD-10-CM

## 2016-06-01 DIAGNOSIS — D0592 Unspecified type of carcinoma in situ of left breast: Secondary | ICD-10-CM

## 2016-06-01 DIAGNOSIS — E785 Hyperlipidemia, unspecified: Secondary | ICD-10-CM | POA: Diagnosis not present

## 2016-06-01 DIAGNOSIS — I2510 Atherosclerotic heart disease of native coronary artery without angina pectoris: Secondary | ICD-10-CM

## 2016-06-01 DIAGNOSIS — Z8701 Personal history of pneumonia (recurrent): Secondary | ICD-10-CM | POA: Diagnosis not present

## 2016-06-01 DIAGNOSIS — Z170 Estrogen receptor positive status [ER+]: Secondary | ICD-10-CM

## 2016-06-01 DIAGNOSIS — C50912 Malignant neoplasm of unspecified site of left female breast: Secondary | ICD-10-CM

## 2016-06-01 LAB — CBC WITH DIFFERENTIAL/PLATELET
Basophils Absolute: 0.1 10*3/uL (ref 0–0.1)
Basophils Relative: 1 %
EOS ABS: 0.2 10*3/uL (ref 0–0.7)
EOS PCT: 3 %
HCT: 40.8 % (ref 35.0–47.0)
Hemoglobin: 13.8 g/dL (ref 12.0–16.0)
LYMPHS ABS: 0.9 10*3/uL — AB (ref 1.0–3.6)
LYMPHS PCT: 17 %
MCH: 31.7 pg (ref 26.0–34.0)
MCHC: 33.8 g/dL (ref 32.0–36.0)
MCV: 93.7 fL (ref 80.0–100.0)
MONO ABS: 0.4 10*3/uL (ref 0.2–0.9)
Monocytes Relative: 7 %
Neutro Abs: 3.9 10*3/uL (ref 1.4–6.5)
Neutrophils Relative %: 72 %
PLATELETS: 291 10*3/uL (ref 150–440)
RBC: 4.35 MIL/uL (ref 3.80–5.20)
RDW: 12 % (ref 11.5–14.5)
WBC: 5.5 10*3/uL (ref 3.6–11.0)

## 2016-06-01 LAB — COMPREHENSIVE METABOLIC PANEL
ALT: 16 U/L (ref 14–54)
ANION GAP: 6 (ref 5–15)
AST: 28 U/L (ref 15–41)
Albumin: 4.6 g/dL (ref 3.5–5.0)
Alkaline Phosphatase: 54 U/L (ref 38–126)
BUN: 12 mg/dL (ref 6–20)
CHLORIDE: 95 mmol/L — AB (ref 101–111)
CO2: 31 mmol/L (ref 22–32)
Calcium: 9.4 mg/dL (ref 8.9–10.3)
Creatinine, Ser: 0.47 mg/dL (ref 0.44–1.00)
GFR calc non Af Amer: >60 mL/min (ref >60)
Glucose, Bld: 124 mg/dL — ABNORMAL HIGH (ref 65–99)
POTASSIUM: 4.3 mmol/L (ref 3.5–5.1)
SODIUM: 132 mmol/L — AB (ref 135–145)
Total Bilirubin: 0.8 mg/dL (ref 0.3–1.2)
Total Protein: 7.6 g/dL (ref 6.5–8.1)

## 2016-06-01 NOTE — Progress Notes (Signed)
Patient here today for surgery clearance.  States she is going to have reverse shoulder replacement on the opposite side from her breast cancer. Patient concerned about IV and BP in the arm on the side where she had her lumpectomy.   Referred by Dr. Redmond Pulling @ [facility].  Former patient of Dr. Oliva Bustard for breast cancer.

## 2016-06-01 NOTE — Progress Notes (Signed)
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  Progesterone receptor is positive. Patient was taken off tamoxifen because of unspecified bone pain on April 19, 2014. Abnormal CT scan of the chest (March, 2016). Bronchoscopy was done which revealed no evidence of malignancy.  All the cultures are negative. 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 Patient cannot take tamoxifen and has discontinued on her own accord   Interval history- She presents today for surgery clearance for right shoulder reversal scheduled for July 06, 2016. She is concerned about her left arm being used for multiple blood pressure and IV sticks while hospitalized for surgery of her right shoulder. She anticipates a 2-3 day stay in the hospital.   ECOG PS- 1 Pain scale- 5  Review of systems- Review of Systems  Constitutional: Negative for chills, fever, malaise/fatigue and weight loss.  HENT: Negative for congestion, ear discharge and nosebleeds.   Eyes: Negative for blurred vision.  Respiratory: Negative for cough, hemoptysis, sputum production, shortness of breath and wheezing.   Cardiovascular: Negative for chest pain, palpitations, orthopnea and claudication.  Gastrointestinal: Negative for abdominal pain, blood in stool, constipation, diarrhea, heartburn, melena,  nausea and vomiting.  Genitourinary: Negative for dysuria, flank pain, frequency, hematuria and urgency.  Musculoskeletal: Negative for back pain, joint pain and myalgias.  Skin: Positive for rash.       Under left arm- Getting better  Neurological: Negative for dizziness, tingling, focal weakness, seizures, weakness and headaches.  Endo/Heme/Allergies: Does not bruise/bleed easily.  Psychiatric/Behavioral: Negative for depression and suicidal ideas. The patient does not have insomnia.    Breast exam was performed in seated and lying down position. Patient is status post left lumpectomy with a well-healed surgical scar. No evidence of any palpable masses. No evidence of axillary adenopathy. No evidence of any palpable masses or lumps in the right breast. No evidence of right axillary adenopathy  Current treatment- observation  Allergies  Allergen Reactions  . Codeine Nausea And Vomiting     Past Medical History:  Diagnosis Date  . Breast CA ([place])    left  . Breast cancer ([place]) 2015   Ductal carcinoma in situ of the left breast. Had radiation tx  . Diabetes ([place])   . HLD (hyperlipidemia)   . Hypertension    NO MEDS  . Pneumonia    SMALL SPOT ON LUNG, HX RADIATION PNEUMONITIS   . PONV (postoperative nausea and vomiting)   . RA (rheumatoid arthritis) ([place])   . Radiation pneumonitis [facility])      Past Surgical History:  Procedure Laterality Date  . ABDOMINAL HYSTERECTOMY  1991  . ANTERIOR CERVICAL DECOMP/DISCECTOMY FUSION N/A 03/16/2015   Procedure: ANTERIOR  CERVICAL DECOMPRESSION FUSION CERVICAL TWO-THREE,CERVICAL THREE-FOUR WITH REMOVAL OF HARDWARE AT CERVICAL FOUR-FIVE.;  Surgeon: Earnie Larsson, MD;  Location: [facility];  Service: Neurosurgery;  Laterality: N/A;  . APPENDECTOMY    .  BREAST BIOPSY Left 08/31/2013   positive  . BREAST LUMPECTOMY Left 10/06/2013 & 11/03/2013  . CARPAL TUNNEL RELEASE Bilateral   . neck fusion    . REPLACEMENT TOTAL KNEE BILATERAL     x2  .  ROTATOR CUFF REPAIR Right     Social History   Social History  . Marital status: Married    Spouse name: N/A  . Number of children: N/A  . Years of education: N/A   Occupational History  . Not on file.   Social History Main Topics  . Smoking status: Never Smoker  . Smokeless tobacco: Never Used  . Alcohol use 1.2 oz/week    2 Glasses of wine per week     Comment: occasional wine  . Drug use: No  . Sexual activity: Not on file   Other Topics Concern  . Not on file   Social History Narrative  . No narrative on file    Family History  Problem Relation Age of Onset  . Cancer Mother     kidney or bladder-unsure of which one  . Cancer Brother 51    lung  . Cancer Brother 11    lung     Current Outpatient Prescriptions:  .  acetaminophen (TYLENOL) 500 MG tablet, Take 500 mg by mouth every 8 (eight) hours as needed for mild pain or moderate pain., Disp: , Rfl:  .  Blood Glucose Monitoring Suppl (ONE TOUCH ULTRA 2) W/DEVICE KIT, , Disp: , Rfl: 11 .  cetirizine (ZYRTEC ALLERGY) 10 MG tablet, Take 1 tablet (10 mg total) by mouth daily., Disp: 30 tablet, Rfl: 12 .  folic acid (FOLVITE) 1 MG tablet, Take 1 mg by mouth daily., Disp: , Rfl: 11 .  gabapentin (NEURONTIN) 100 MG capsule, Take 100 mg by mouth 3 (three) times daily., Disp: , Rfl:  .  hydroxychloroquine (PLAQUENIL) 200 MG tablet, Take 200 mg by mouth daily., Disp: , Rfl:  .  metFORMIN (GLUCOPHAGE) 500 MG tablet, Take 1,000 mg by mouth 2 (two) times daily with a meal., Disp: , Rfl:  .  pravastatin (PRAVACHOL) 40 MG tablet, Take 40 mg by mouth daily., Disp: , Rfl:  .  vitamin B-12 (CYANOCOBALAMIN) 1000 MCG tablet, Take 1,000 mcg by mouth 2 (two) times daily., Disp: , Rfl:  .  AMBULATORY NON FORMULARY MEDICATION, Medication Name: incentive spirometry  Use as directed, Disp: 1 each, Rfl: 0 .  ANORO ELLIPTA 62.5-25 MCG/INH AEPB, INHALE 1 PUFF INTO THE LUNGS DAILY (Patient not taking: Reported on 06/01/2016), Disp: 60 each,  Rfl: 5 .  benzonatate (TESSALON) 100 MG capsule, Take 1 capsule by mouth 3 (three) times daily as needed for cough. , Disp: , Rfl: 0 .  cyclobenzaprine (FLEXERIL) 10 MG tablet, Take 1 tablet (10 mg total) by mouth 3 (three) times daily as needed for muscle spasms. (Patient not taking: Reported on 06/01/2016), Disp: 30 tablet, Rfl: 0 .  fluticasone (FLONASE) 50 MCG/ACT nasal spray, Place 1 spray into both nostrils 2 (two) times daily., Disp: , Rfl:  .  GLUCAGON EMERGENCY 1 MG injection, Inject 1 mg into the muscle once as needed. , Disp: , Rfl: 0 .  montelukast (SINGULAIR) 10 MG tablet, Take 1 tablet (10 mg total) by mouth at bedtime. (Patient not taking: Reported on 06/01/2016), Disp: 30 tablet, Rfl: 1 .  Respiratory Therapy Supplies (FLUTTER) DEVI, 1 each by Does not apply route once. (Patient not taking: Reported on 06/01/2016), Disp: 1 each, Rfl: 0  Physical exam:  Vitals:   06/01/16 1144  BP: (!) 152/77  Pulse: 80  Resp: 18  Temp: 98.2 F (36.8 C)  TempSrc: Tympanic  Weight: 129 lb 6.6 oz (58.7 kg)   Physical Exam  Constitutional: She is oriented to person, place, and time and well-developed, well-nourished, and in no distress.  HENT:  Head: Normocephalic and atraumatic.  Eyes: EOM are normal. Pupils are equal, round, and reactive to light.  Neck: Normal range of motion.  Cardiovascular: Normal rate, regular rhythm and normal heart sounds.   Pulmonary/Chest: Effort normal and breath sounds normal.  Abdominal: Soft. Bowel sounds are normal.  Neurological: She is alert and oriented to person, place, and time.  Skin: Skin is warm and dry.     CMP Latest Ref Rng & Units 06/01/2016  Glucose 65 - 99 mg/dL 124(H)  BUN 6 - 20 mg/dL 12  Creatinine 0.44 - 1.00 mg/dL 0.47  Sodium 135 - 145 mmol/L 132(L)  Potassium 3.5 - 5.1 mmol/L 4.3  Chloride 101 - 111 mmol/L 95(L)  CO2 22 - 32 mmol/L 31  Calcium 8.9 - 10.3 mg/dL 9.4  Total Protein 6.5 - 8.1 g/dL 7.6  Total Bilirubin 0.3 - 1.2 mg/dL  0.8  Alkaline Phos 38 - 126 U/L 54  AST 15 - 41 U/L 28  ALT 14 - 54 U/L 16   CBC Latest Ref Rng & Units 06/01/2016  WBC 3.6 - 11.0 K/uL 5.5  Hemoglobin 12.0 - 16.0 g/dL 13.8  Hematocrit 35.0 - 47.0 % 40.8  Platelets 150 - 440 K/uL 291    No images are attached to the encounter.  Ct Chest Wo Contrast  Result Date: 05/17/2016 CLINICAL DATA:  76 year old female with a history of left breast cancer treated with radiation therapy in 2015 with reported history of radiation pneumonitis. EXAM: CT CHEST WITHOUT CONTRAST TECHNIQUE: Multidetector CT imaging of the chest was performed following the standard protocol without IV contrast. COMPARISON:  01/13/2016 chest CT. FINDINGS: Cardiovascular: Normal heart size. No significant pericardial fluid/thickening. Left anterior descending coronary atherosclerosis. Atherosclerotic nonaneurysmal thoracic aorta. Normal caliber pulmonary arteries. Mediastinum/Nodes: No discrete thyroid nodules. Unremarkable esophagus. No pathologically enlarged axillary, mediastinal or gross hilar lymph nodes, noting limited sensitivity for the detection of hilar adenopathy on this noncontrast study. Lungs/Pleura: No pneumothorax. No pleural effusion. Previously described apical 6 mm upper lobe ground-glass pulmonary nodule is absent on this scan. No acute consolidative airspace disease, lung masses or new significant pulmonary nodules. Mild sharply marginated subpleural reticulation in the anterior left upper lobe, unchanged, compatible with mild radiation fibrosis. Upper abdomen: Unremarkable. Musculoskeletal: No aggressive appearing focal osseous lesions. Moderate thoracic spondylosis. Focal soft tissue asymmetry in the outer left breast (series 2/ image 68) is not appreciably changed. IMPRESSION: 1. No evidence of metastatic disease in the chest. Previously described apical right upper lobe ground-glass pulmonary nodule is absent on this scan, compatible with resolution of a benign  inflammatory nodule. 2. Stable nonspecific focal soft tissue asymmetry in the outer left breast, suggesting post treatment change, for which mammographic correlation is advised. 3. 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 4. Aortic atherosclerosis.  One vessel coronary atherosclerosis. Electronically Signed   By: Ilona Sorrel M.D.   On: 05/17/2016 13:05     Assessment and plan- Patient is a 76 y.o. female with a history of Stage 0 ductal carcinoma in situ and is in need of medical clearance for a right shoulder reversal. From an oncology standpoint, there is no reason for  the patient to not have surgery. Left arm is okay to use for blood pressure and IV sticks while she is hospitalized. Dr. Paulino Door last ordered mammogram in June 2017. Ordered mammogram for June 2018. She is currently not on Tamoxifen or an AI due to side effects. She offers no complaints at this time. Will see her back in 1 year.     Visit Diagnosis 1. Carcinoma in situ of left breast, unspecified type     Jacquelin Hawking, NP

## 2016-06-15 ENCOUNTER — Ambulatory Visit: Payer: Medicare Other | Admitting: Internal Medicine

## 2016-06-15 ENCOUNTER — Other Ambulatory Visit: Payer: Medicare Other

## 2016-06-22 IMAGING — MG MM BREAST NEEDLE LOCALIZATION*L*
1 series · 8 of 8 positions shown · non-contrast
Comparison: Previous exams.

CLINICAL DATA: Patient presents for needle localization of
biopsy-proven ADH/ALH the upper-outer quadrant left breast.

EXAM:
NEEDLE LOCALIZATION OF THE LEFT BREAST WITH MAMMO GUIDANCE

[L LM · left · 8 of 9 slices shown]
[im 1/9]
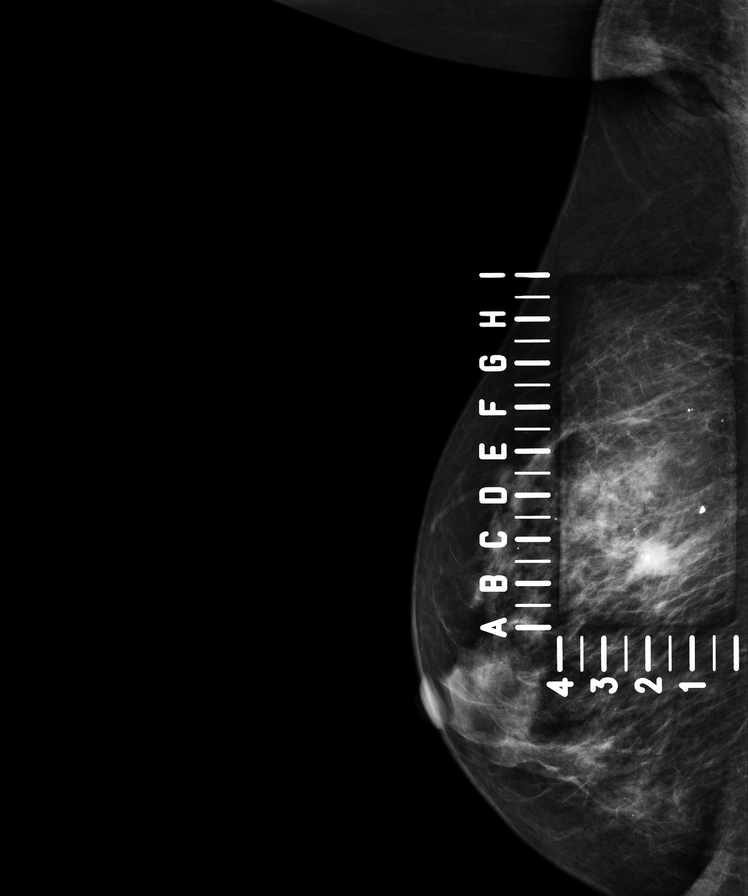
[im 2/9]
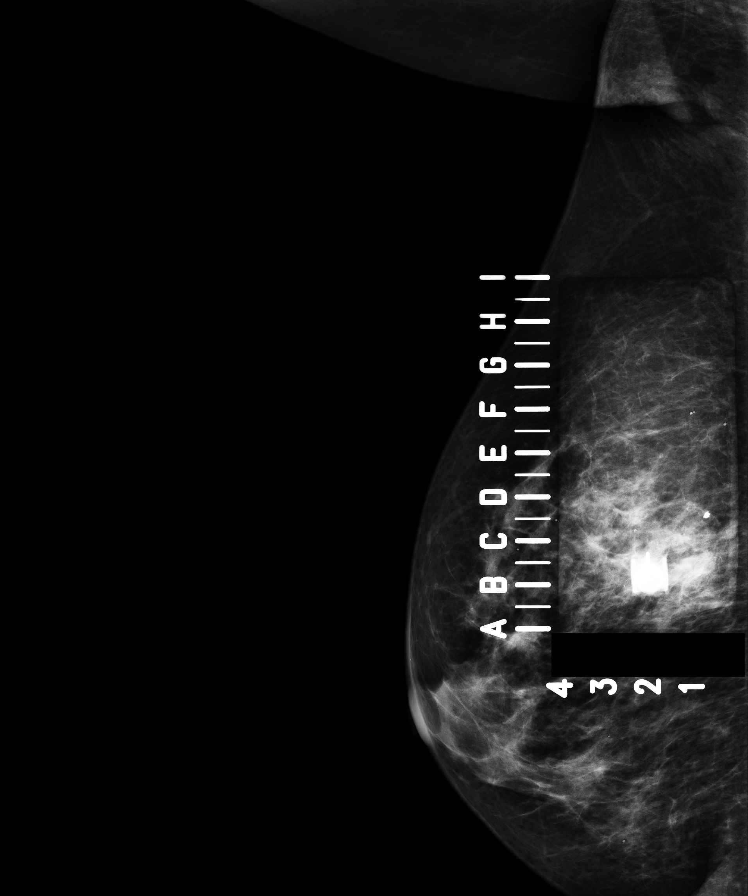
[im 3/9]
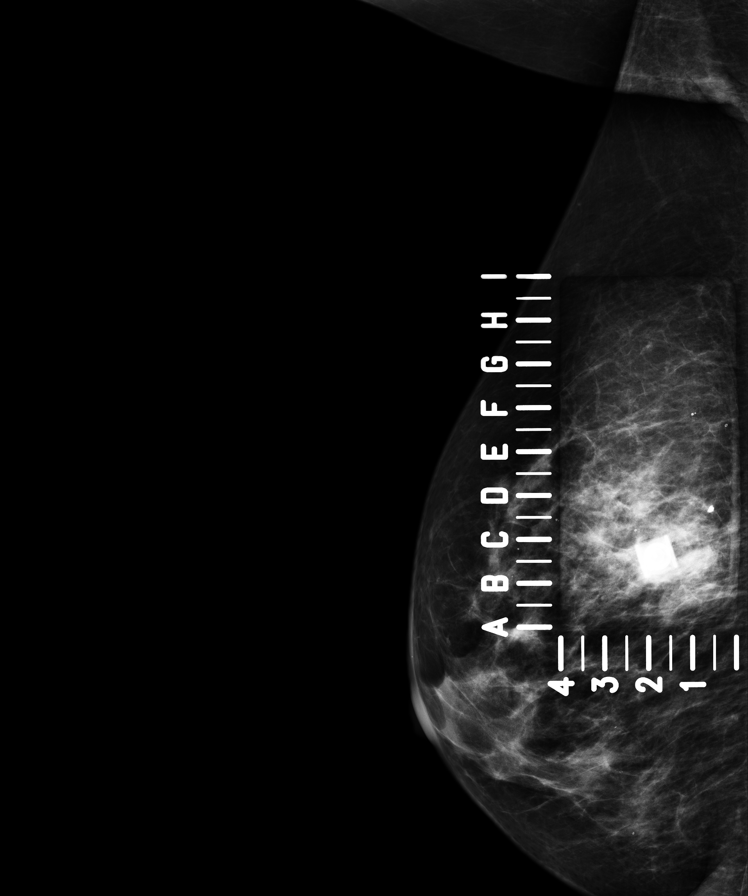
[im 4/9]
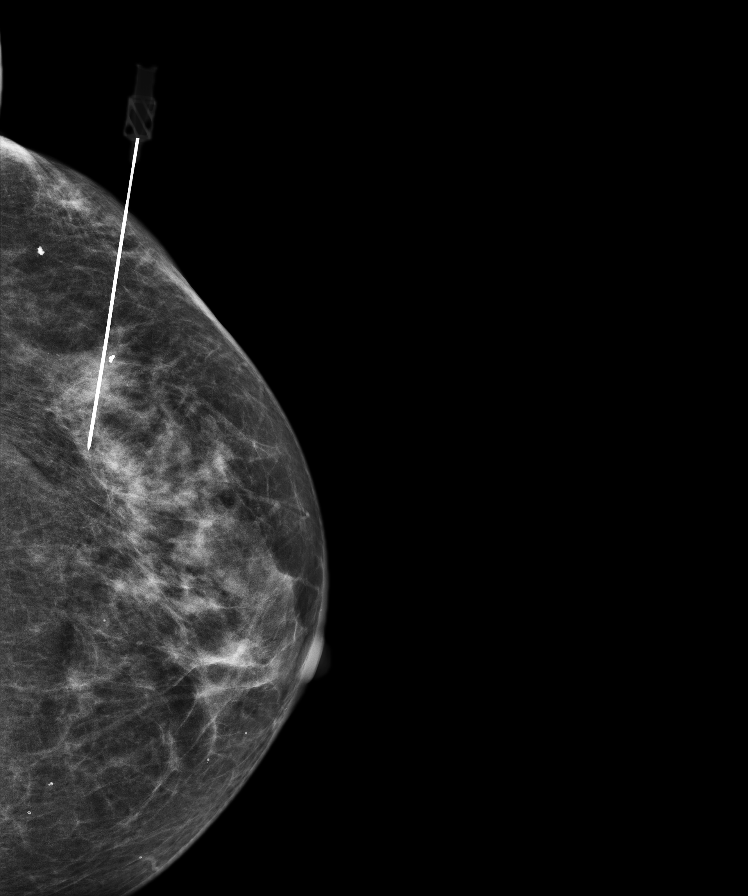
[im 5/9]
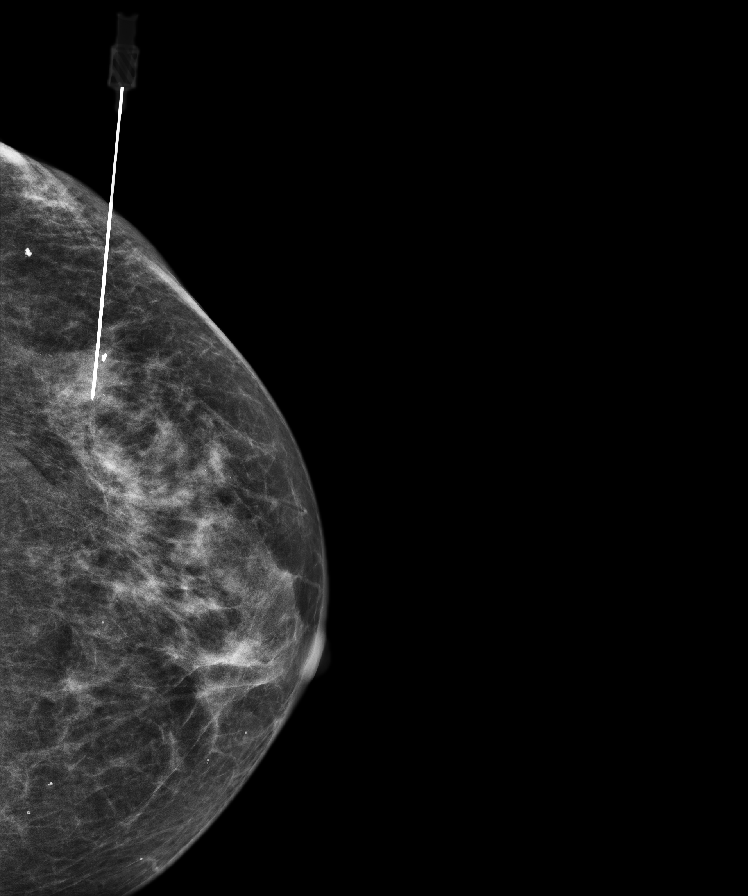
[im 6/9]
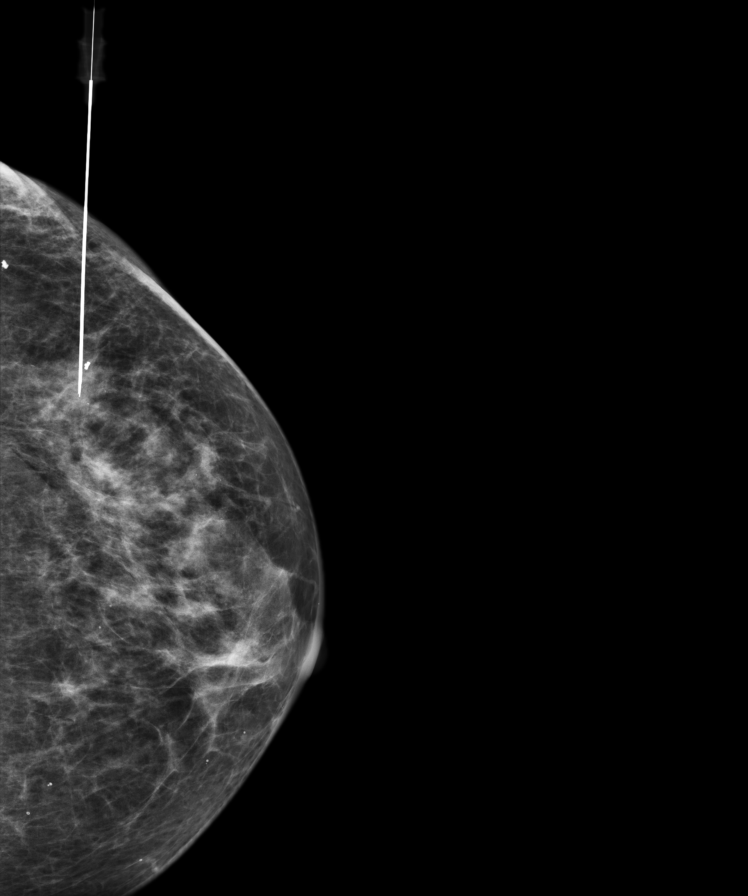
[im 7/9]
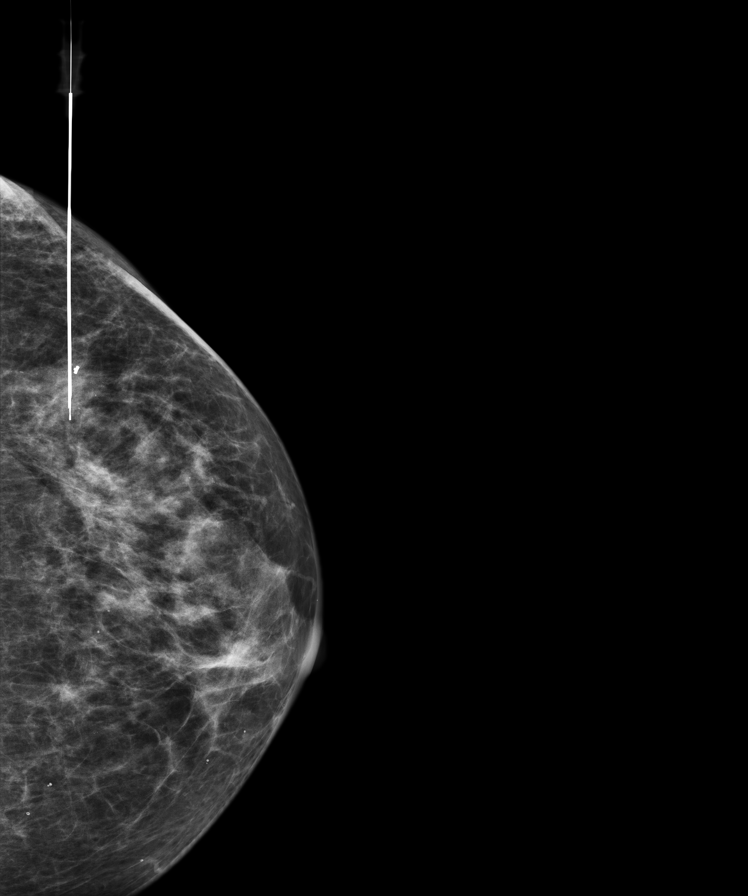
[im 9/9]
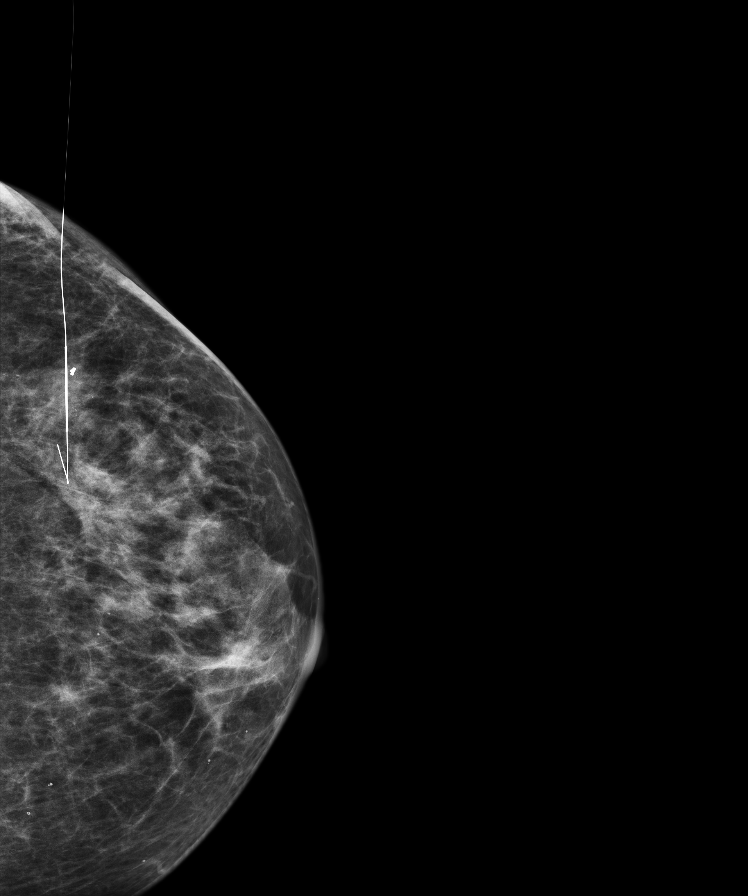

[8 of 8 positions shown; findings below may reference images not displayed]

FINDINGS: Patient presents for needle localization prior to surgical excision.
I met with the patient and we discussed the procedure of needle
localization including benefits and alternatives. We discussed the
high likelihood of a successful procedure. We discussed the risks of
the procedure, including infection, bleeding, tissue injury, and
further surgery. Informed, written consent was given. The usual
time-out protocol was performed immediately prior to the procedure.

Using mammographic guidance, sterile technique, 2% lidocaine and a 7
cm modified Kopans needle, the post biopsy top hat shaped metallic
clip was localized using lateral to medial approach. The wire passes
immediately posterior to the metallic clip as the clip is located at
the proximal reinforced segment of the wire. The the tip of the wire
is 2.6 cm distal/medial to the clip. The films were marked for Dr.
Keziah. Patient tolerated the procedure well and was sent to the OR
for surgical excision.
IMPRESSION: Needle localization left breast. No apparent complications.

## 2016-06-22 IMAGING — MG MM BREAST SURGICAL SPECIMEN
1 series · 2 of 2 positions shown · non-contrast
Comparison: Previous exam(s)

CLINICAL DATA: Patient is post surgical excision of biopsy proven
ADH/ALH outer mid portion left breast.

EXAM:
SPECIMEN RADIOGRAPH OF THE LEFT BREAST

[L CC · left · 2 of 2 slices shown]
[im 1/2]
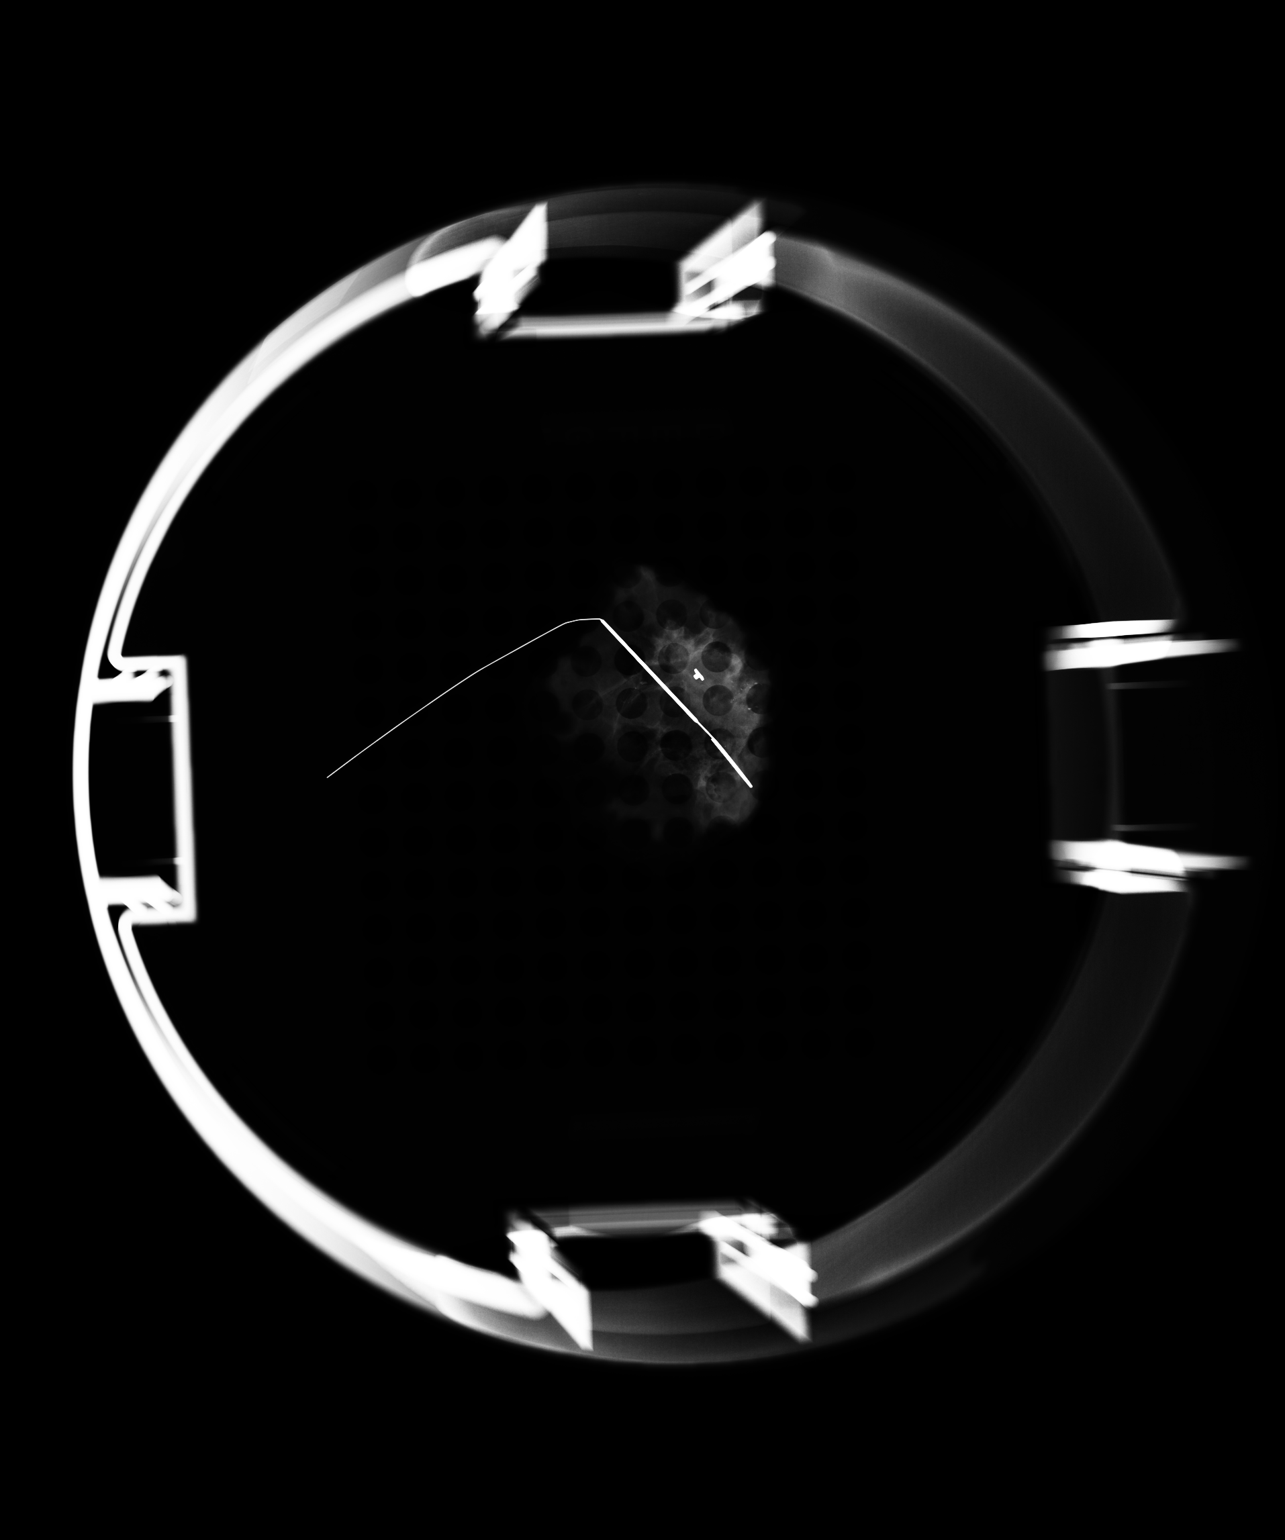
[im 2/2]
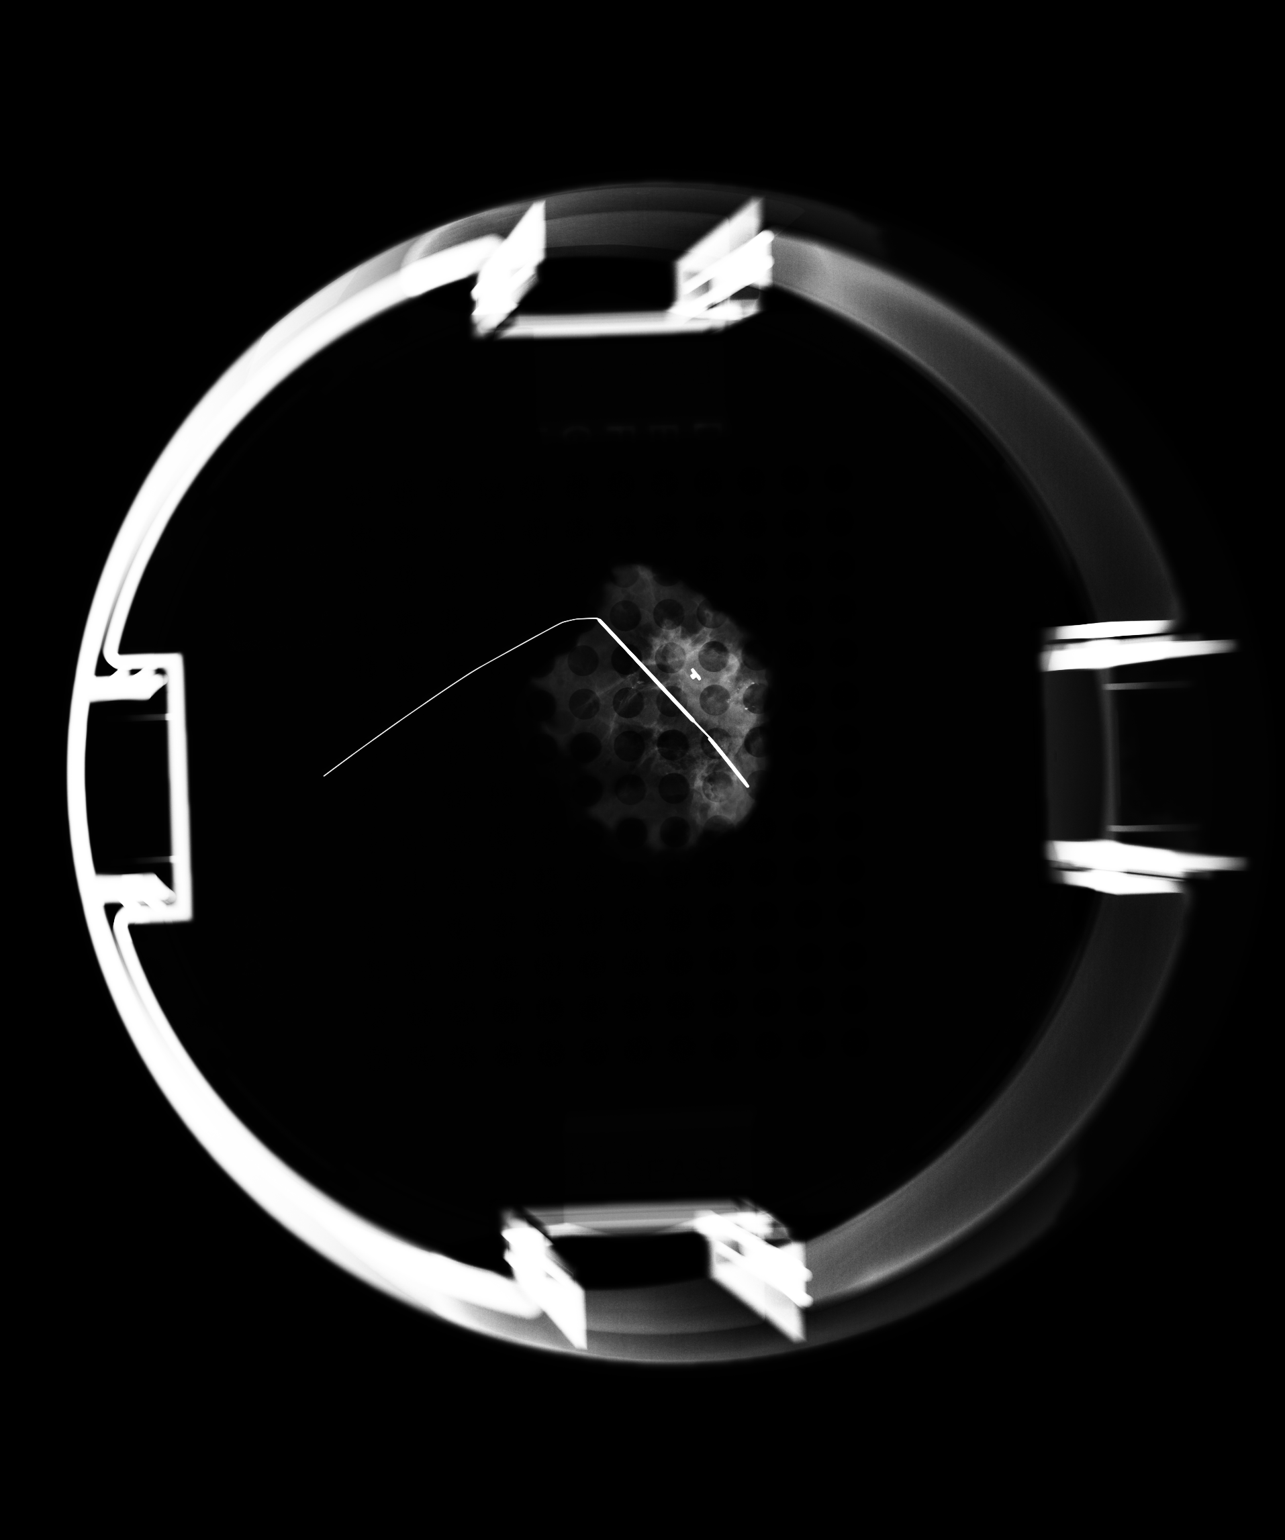

[2 of 2 positions shown; findings below may reference images not displayed]

FINDINGS: Status post excision of the left breast. The wire tip and biopsy
marker clip are present and are marked for pathology.
IMPRESSION: Specimen radiograph of the left breast.

## 2016-07-13 ENCOUNTER — Ambulatory Visit: Payer: Medicare Other | Admitting: Internal Medicine

## 2016-07-13 ENCOUNTER — Ambulatory Visit: Payer: Medicare Other | Admitting: Radiation Oncology

## 2016-07-13 ENCOUNTER — Other Ambulatory Visit: Payer: Medicare Other

## 2016-08-08 ENCOUNTER — Ambulatory Visit
Admission: RE | Admit: 2016-08-08 | Discharge: 2016-08-08 | Disposition: A | Discharge status: Home / Self Care | Payer: Medicare Other | Source: Ambulatory Visit | Attending: Radiation Oncology | Admitting: Radiation Oncology

## 2016-08-08 ENCOUNTER — Encounter: Payer: Self-pay | Admitting: Radiation Oncology

## 2016-08-08 VITALS — BP 152/81 | HR 72 | Temp 96.9°F | Resp 18 | Wt 125.1 lb

## 2016-08-08 DIAGNOSIS — C50912 Malignant neoplasm of unspecified site of left female breast: Secondary | ICD-10-CM

## 2016-08-08 DIAGNOSIS — Z923 Personal history of irradiation: Secondary | ICD-10-CM | POA: Diagnosis not present

## 2016-08-08 DIAGNOSIS — Z86000 Personal history of in-situ neoplasm of breast: Secondary | ICD-10-CM | POA: Insufficient documentation

## 2016-08-08 DIAGNOSIS — Z170 Estrogen receptor positive status [ER+]: Secondary | ICD-10-CM

## 2016-08-08 NOTE — Progress Notes (Addendum)
Radiation Oncology Follow up Note  Name: Ashley Bray   Date:   08/08/2016 MRN:  [id number] DOB: [birth year]    This 76 y.o. 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 She is seen today in routine follow-up and is doing well she is scheduled for shoulder surgery which is been delayed secondary questions about inflammation. She specifically denies breast tenderness cough or bone pain.. Her MRI of her shoulder does show a completes supraspinatus tear and full-thickness tear of the infraspinatus tendon. CT scan back in March 2018 shows no evidence of metastatic disease in the chest and by recent CT scan. Mammograms back in June 2017 will BI-RADS 2 benign she scheduled for repeat this June. She specifically denies breast tenderness cough or bone pain.  COMPLICATIONS OF TREATMENT: none  FOLLOW UP COMPLIANCE: keeps appointments   PHYSICAL EXAM:  BP (!) 152/81   Pulse 72   Temp (!) 96.9 F (36.1 C)   Resp 18   Wt 125 lb 1.8 oz (56.8 kg)   BMI 22.88 kg/m  Lungs are clear to A&P cardiac examination essentially unremarkable with regular rate and rhythm. No dominant mass or nodularity is noted in either breast in 2 positions examined. Incision is well-healed. No axillary or supraclavicular adenopathy is appreciated. Cosmetic result is excellent. Well-developed well-nourished patient in NAD. HEENT reveals PERLA, EOMI, discs not visualized.  Oral cavity is clear. No oral mucosal lesions are identified. Neck is clear without evidence of cervical or supraclavicular adenopathy. Lungs are clear to A&P. Cardiac examination is essentially unremarkable with regular rate and rhythm without murmur  rub or thrill. Abdomen is benign with no organomegaly or masses noted. Motor sensory and DTR levels are equal and symmetric in the upper and lower extremities. Cranial nerves II through XII are grossly intact. Proprioception is intact. No peripheral adenopathy or edema is identified. No motor or sensory levels are noted. Crude visual fields are within normal range.  RADIOLOGY RESULTS: Mammograms reviewed and compatible with the above-stated findings. MRI of shoulder and CT scan of chest also reviewed and compatible above-stated findings  PLAN: At the present time patient is doing well with no evidence of disease. I'm please were overall progress. She could not take anti-estrogen therapy so that is been discontinued. She scheduled for repeat mammogram in June. I've asked to see her back in 1 year for follow-up. She knows to call sooner with any concerns.  I would like to take this opportunity to thank you for allowing me to participate in the care of your patient.Armstead Peaks., MD

## 2016-08-17 DIAGNOSIS — M7071 Other bursitis of hip, right hip: Secondary | ICD-10-CM | POA: Insufficient documentation

## 2016-08-20 ENCOUNTER — Ambulatory Visit
Admission: RE | Admit: 2016-08-20 | Discharge: 2016-08-20 | Disposition: A | Discharge status: Home / Self Care | Payer: Medicare Other | Source: Ambulatory Visit | Attending: Oncology | Admitting: Oncology

## 2016-08-20 DIAGNOSIS — D0592 Unspecified type of carcinoma in situ of left breast: Secondary | ICD-10-CM | POA: Insufficient documentation

## 2016-10-17 HISTORY — PX: NECK SURGERY: SHX720

## 2017-01-02 DIAGNOSIS — M75121 Complete rotator cuff tear or rupture of right shoulder, not specified as traumatic: Secondary | ICD-10-CM | POA: Insufficient documentation

## 2017-02-03 IMAGING — CT CT CHEST W/O CM
2 of 3 series · 15 of 36 positions shown, 18 images · non-contrast
Comparison: None.

CLINICAL DATA: Hx of Left br cancer [DATE]-[DATE] with partial
mast, rad tx and chemo pill. Pt c/o SOB and drainage from breast. Pt
states her PCP drains breast once a week. Pt states [place]
the beach [place] and [place] thought she had PNA.

EXAM:
CT CHEST WITHOUT CONTRAST
TECHNIQUE: Multidetector CT imaging of the chest was performed following the
standard protocol without IV contrast..

[Series 2: routine chest wo · axial · 0.58mm/px · z∈[-970,-710]mm · 12 of 62 slices shown, 15 images]
[im 5/62  mediastinal]
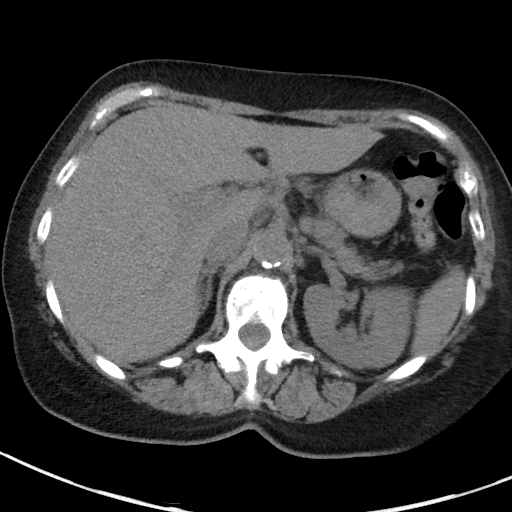
[im 5/62  lung]
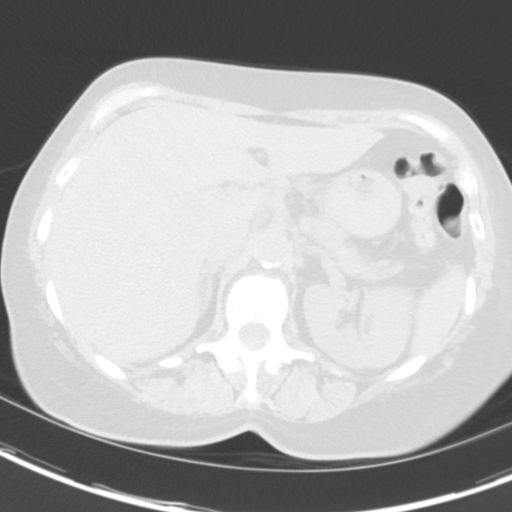
[im 10/62  lung]
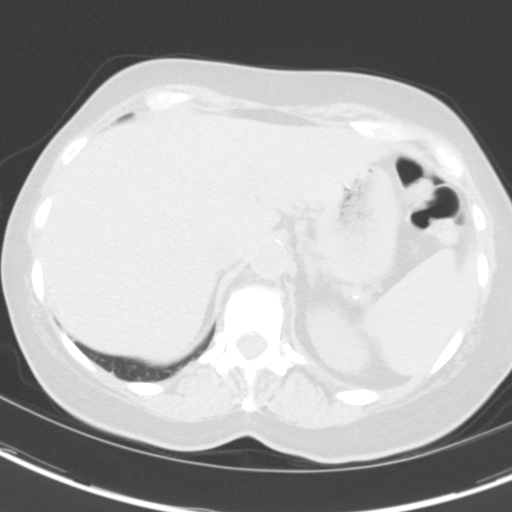
[im 14/62  lung]
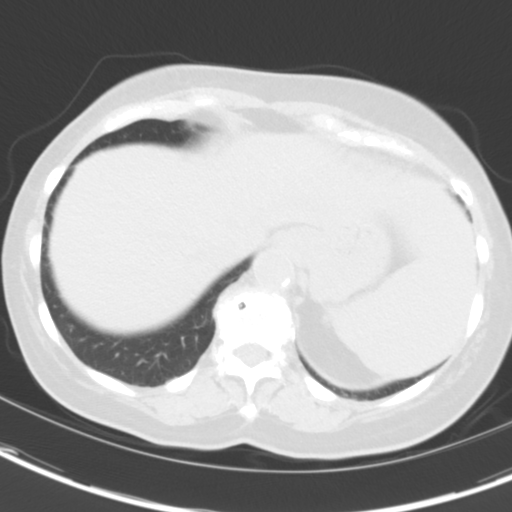
[im 19/62  lung]
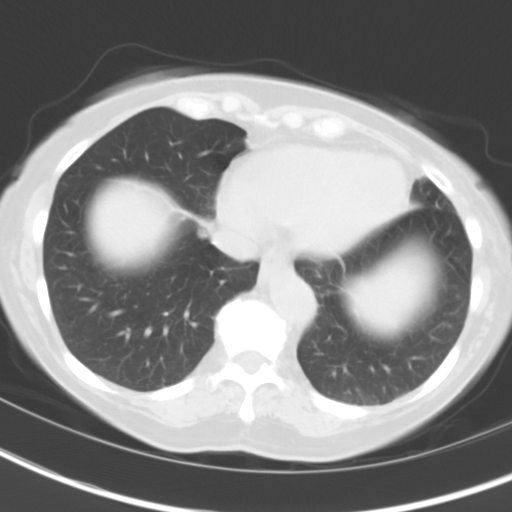
[im 23/62  mediastinal]
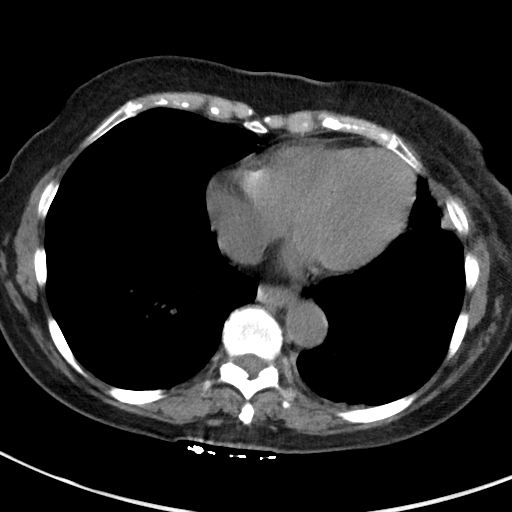
[im 23/62  lung]
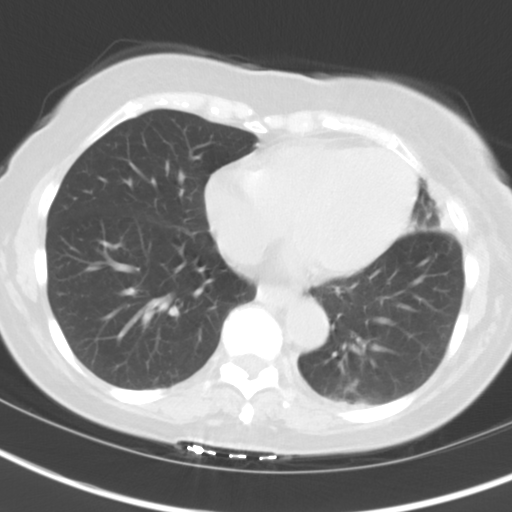
[im 28/62  lung]
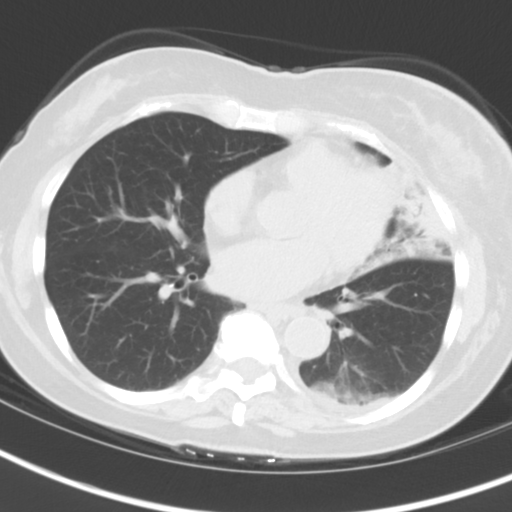
[im 34/62  lung]
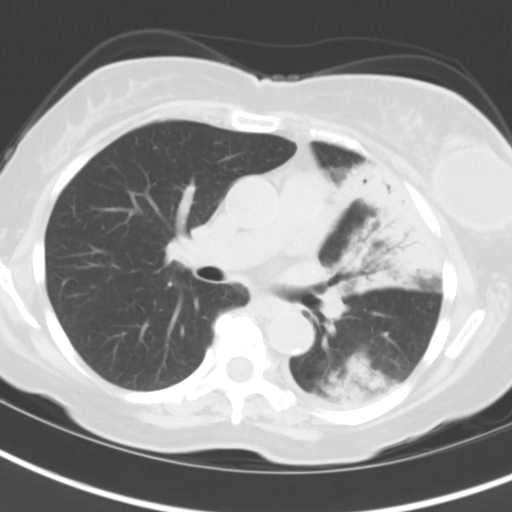
[im 39/62  lung]
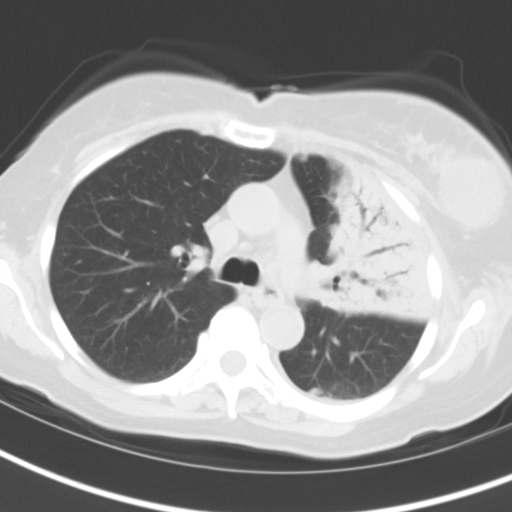
[im 43/62  mediastinal]
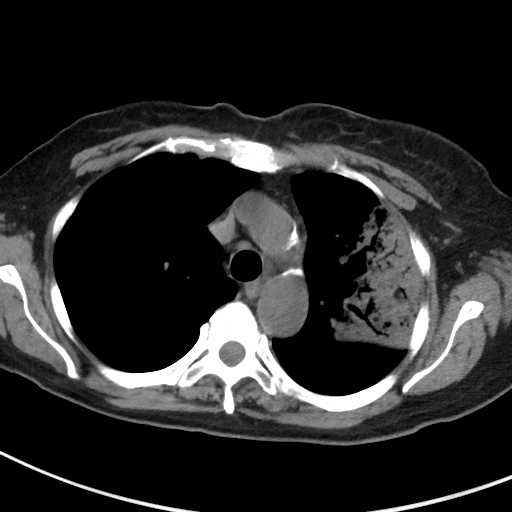
[im 43/62  lung]
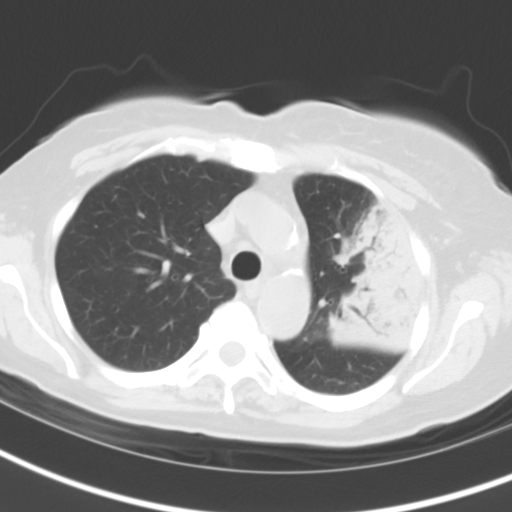
[im 48/62  lung]
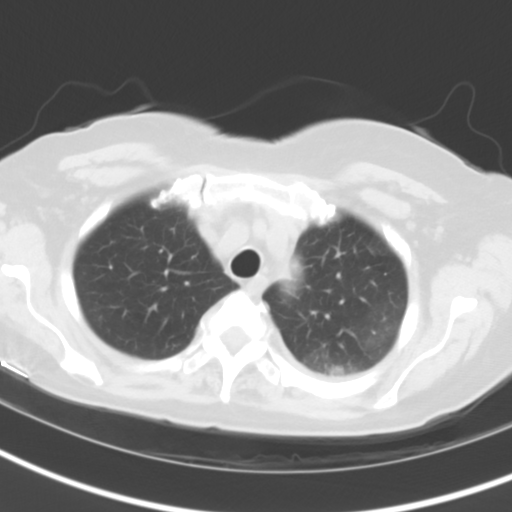
[im 52/62  lung]
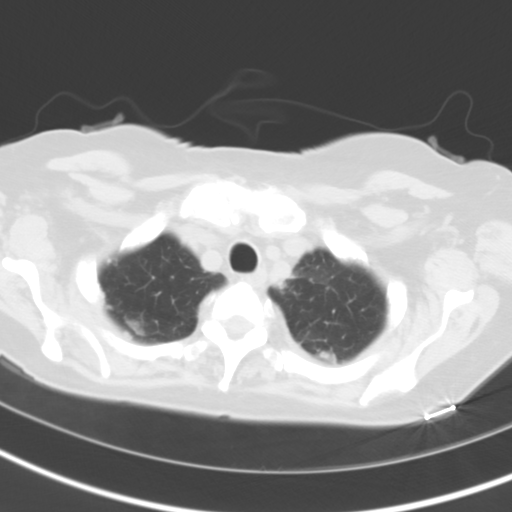
[im 57/62  lung]
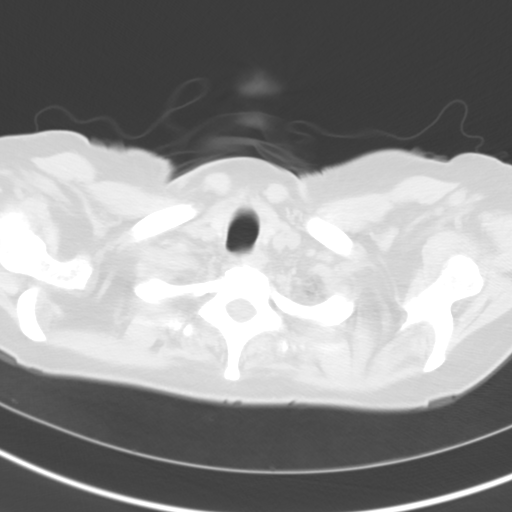

[Series 5: cor routine chest wo · coronal · 0.57mm/px · 3 of 108 slices shown]
[im 22/108  lung]
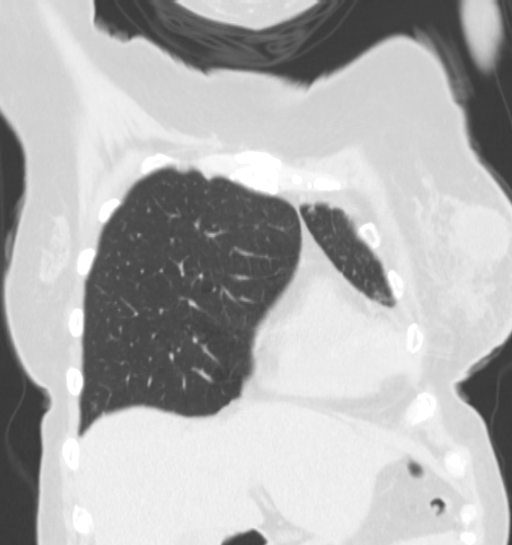
[im 43/108  lung]
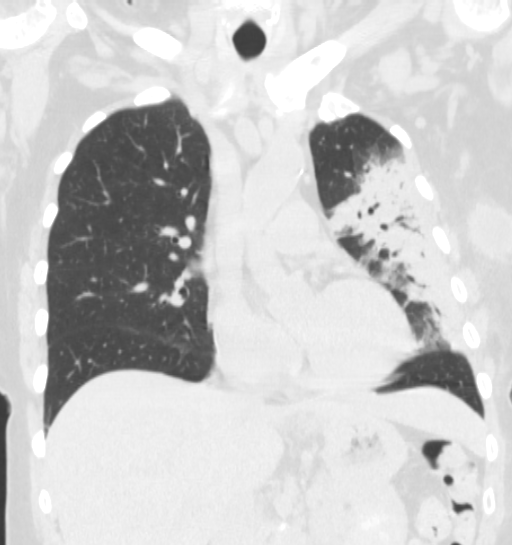
[im 65/108  lung]
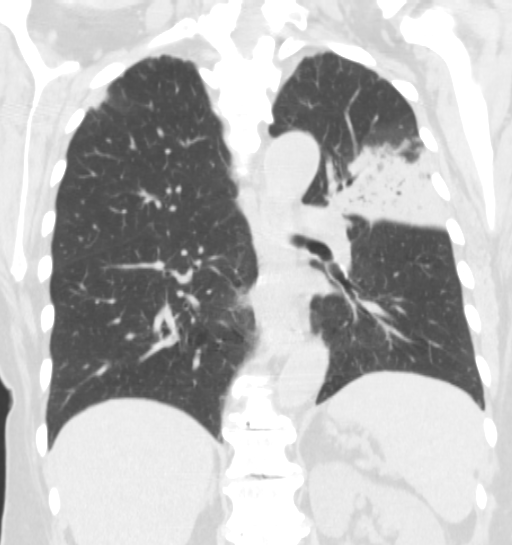

[15 of 36 positions shown; findings below may reference images not displayed]

FINDINGS: There is dense consolidation, with air bronchograms, within the left
upper lobe extending from the lingula posteriorly and laterally into
the mid to upper portion of the left upper lobe, also mostly
posteriorly and laterally.

There is a small area of similar consolidation in the posterior left
lower lobe.

There are small areas of ground-glass type opacity noted at both
apices which may reflect chronic areas of scarring or additional
infiltrate, the former favored.

Airways are patent.

No lung masses or suspicious nodules. No pulmonary edema. There is
some volume loss on the left with the right lung mildly
hyperexpanded.

No pleural effusion or pneumothorax.

No mediastinal or hilar masses. No pathologically enlarged lymph
nodes. Heart normal in size and configuration. Great vessels normal
in caliber.

There are changes from left breast surgery. There is a well-defined
oval fluid collection in the left lateral breast soft tissues
measuring 5.1 cm x 4.7 cm x 5.4 cm. This is consistent with a
postoperative seroma.

No neck base or axillary masses or adenopathy.

Limited evaluation of the upper abdomen is unremarkable.

Musculoskeletal: Bony structures are demineralized. There are
degenerative changes throughout the visualized spine. No
osteoblastic or osteolytic lesions.
IMPRESSION: 1. Consolidation throughout a significant portion of the left upper
lobe is consistent with pneumonia. There is no central obstructing
lesion. There is a small area of additional pneumonia in the
posterior left lower lobe.
2. No other acute findings. No evidence of malignancy. Small areas
of ground-glass opacity noted at the apices are most likely due to
scarring.
3. No mediastinal or hilar masses or pathologically enlarged lymph
nodes.
4. 5.4 cm fluid collection in the lateral left breast consistent
with a postoperative seroma.

## 2017-03-14 DIAGNOSIS — S8001XA Contusion of right knee, initial encounter: Secondary | ICD-10-CM | POA: Insufficient documentation

## 2017-03-14 DIAGNOSIS — S7001XA Contusion of right hip, initial encounter: Secondary | ICD-10-CM | POA: Insufficient documentation

## 2017-03-14 DIAGNOSIS — M25519 Pain in unspecified shoulder: Secondary | ICD-10-CM | POA: Insufficient documentation

## 2017-03-21 IMAGING — CR DG CHEST 2V
1 series · 2 of 2 positions shown · non-contrast
Comparison: 05/26/2014.

CLINICAL DATA: Radiation pneumonitis.

EXAM:
CHEST  2 VIEW

[Series 1: w chest pa · 0.14mm/px · 2 of 2 slices shown]
[im 1/2]
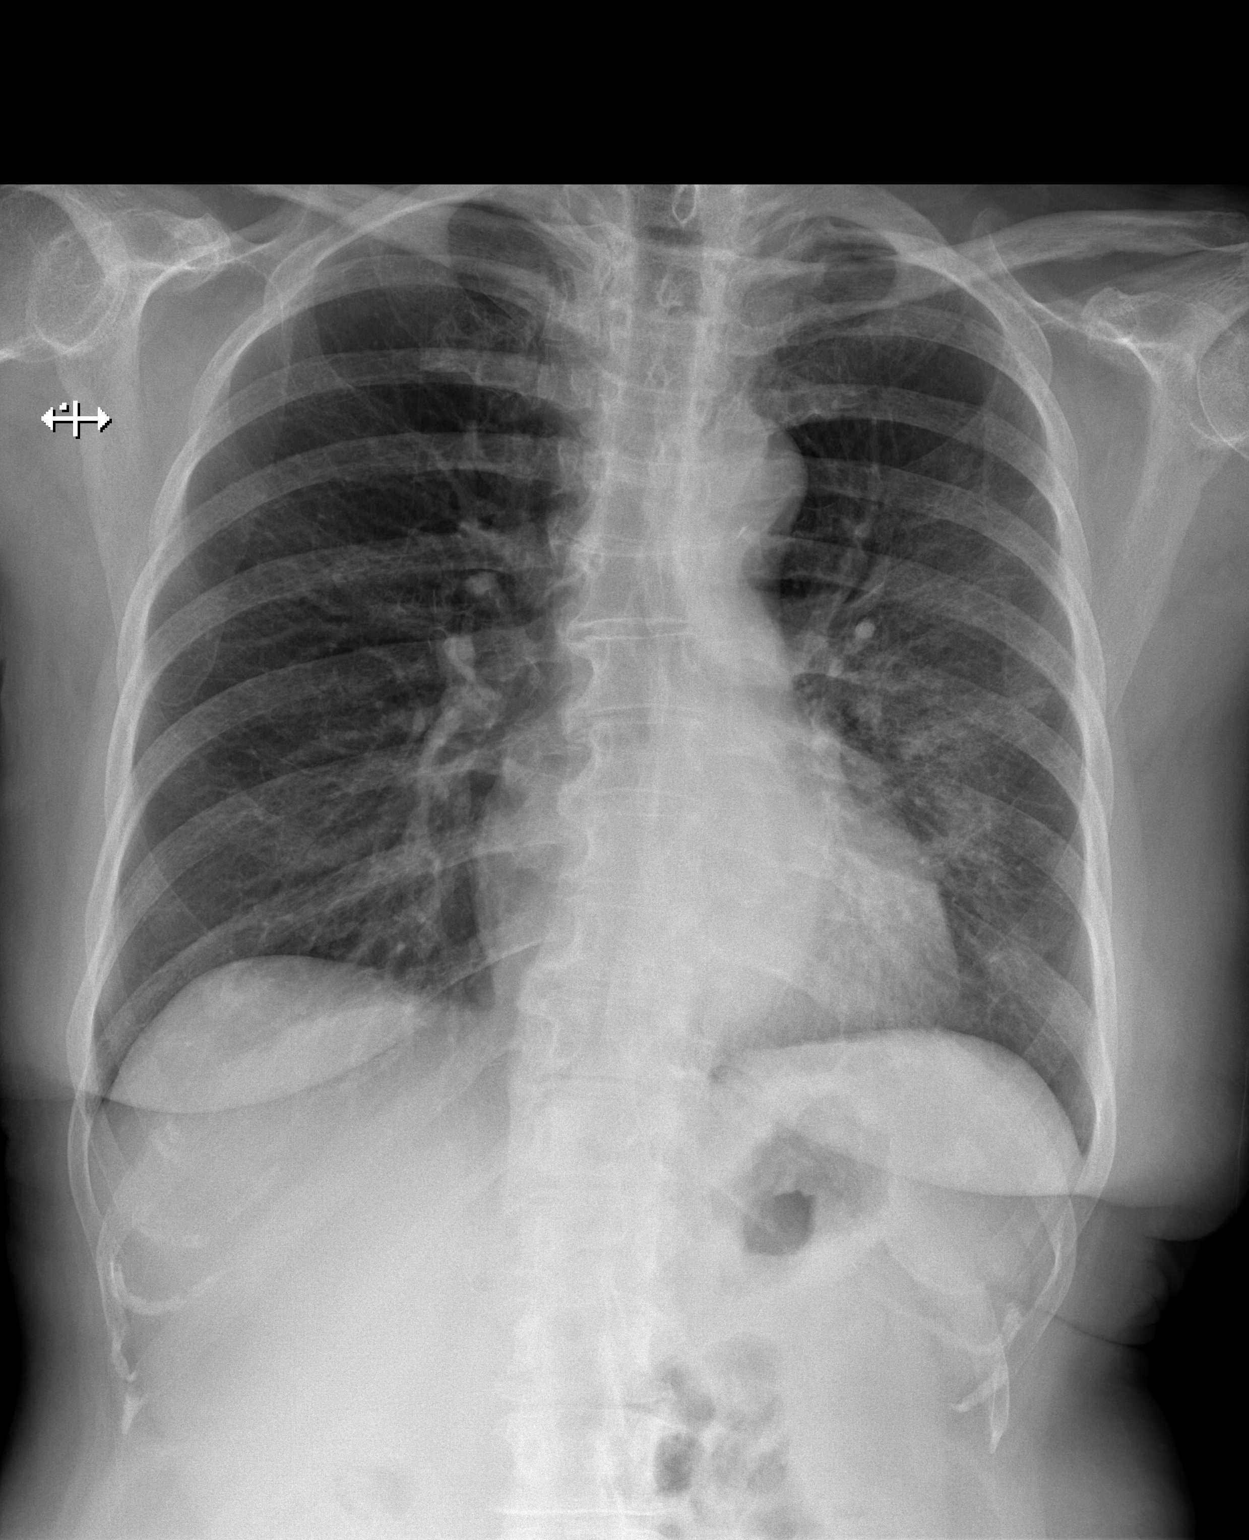
[im 2/2]
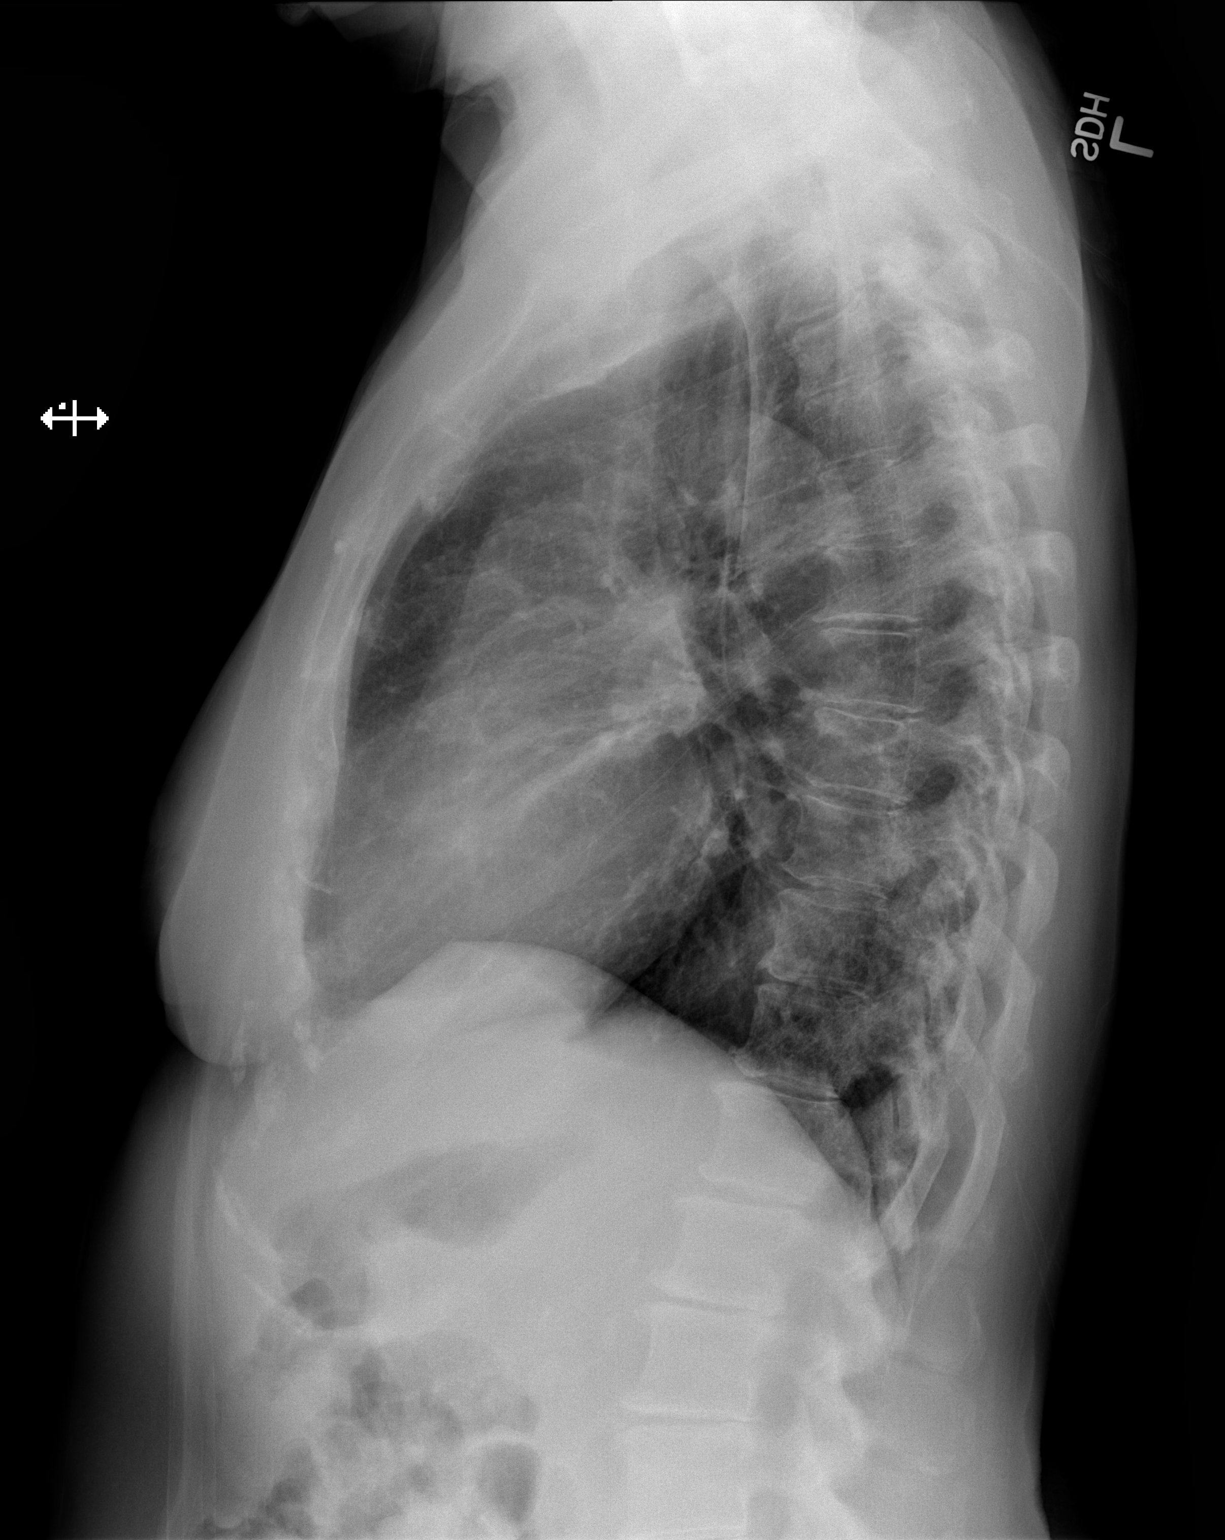

[2 of 2 positions shown; findings below may reference images not displayed]

FINDINGS: Mediastinum hilar structures are normal. Heart size is stable.
Pulmonary vascularity normal. Partial resolution of left lung
infiltrate. No pleural effusion or pneumothorax. No acute bony
abnormality. Prior cervical spine fusion. Degenerative change
thoracic spine .
IMPRESSION: Partial resolution diffuse left lung infiltrate/pneumonitis.

## 2017-05-03 IMAGING — MG MM DIAG BREAST TOMO BILATERAL
9 of 13 series · 9 of 29 positions shown · non-contrast
Comparison: Prior exams

CLINICAL DATA: Status post lumpectomy for DCIS performed in August 2013. No current complaints.

EXAM:
DIGITAL DIAGNOSTIC BILATERAL MAMMOGRAM WITH 3D TOMOSYNTHESIS AND CAD

[L TAN]
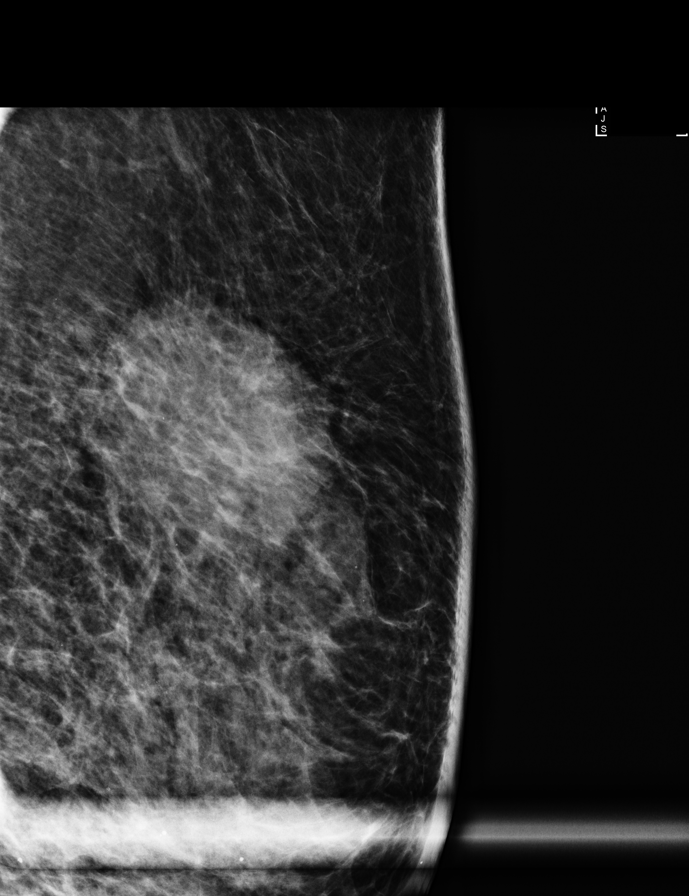

[R CC synth-2D]
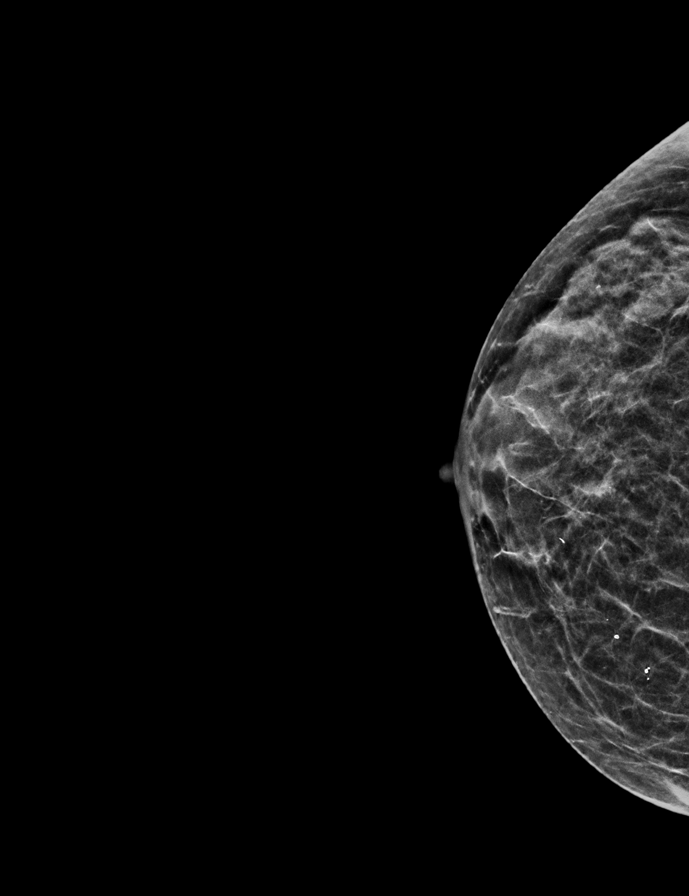

[R MLO synth-2D]
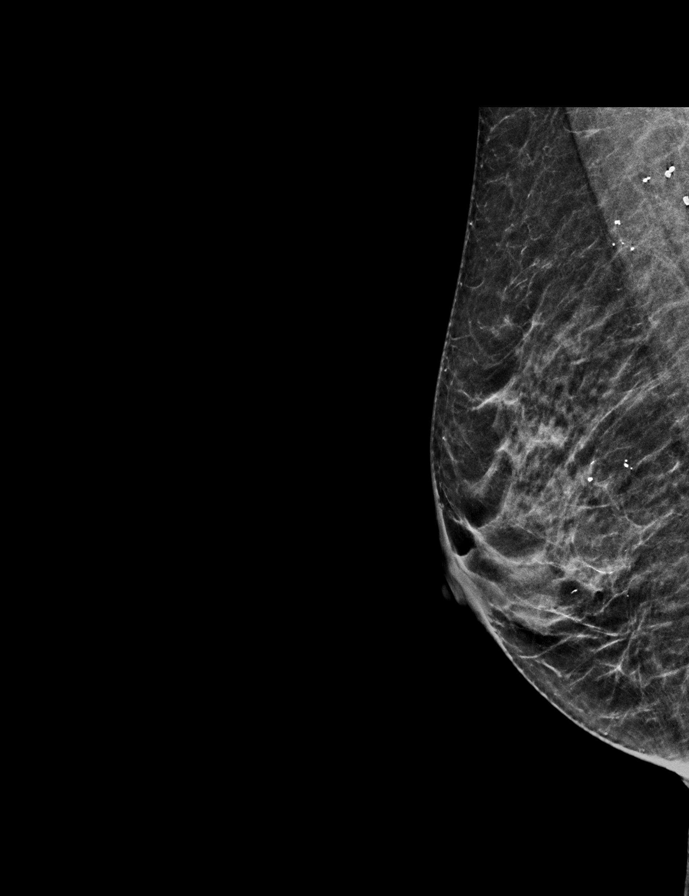

[R MLO]
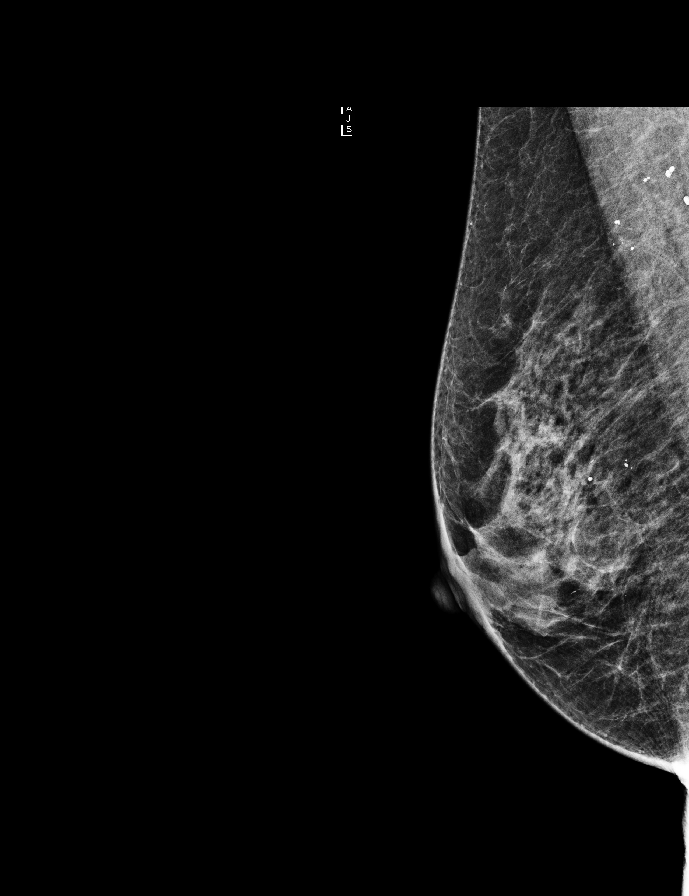

[L MLO synth-2D]
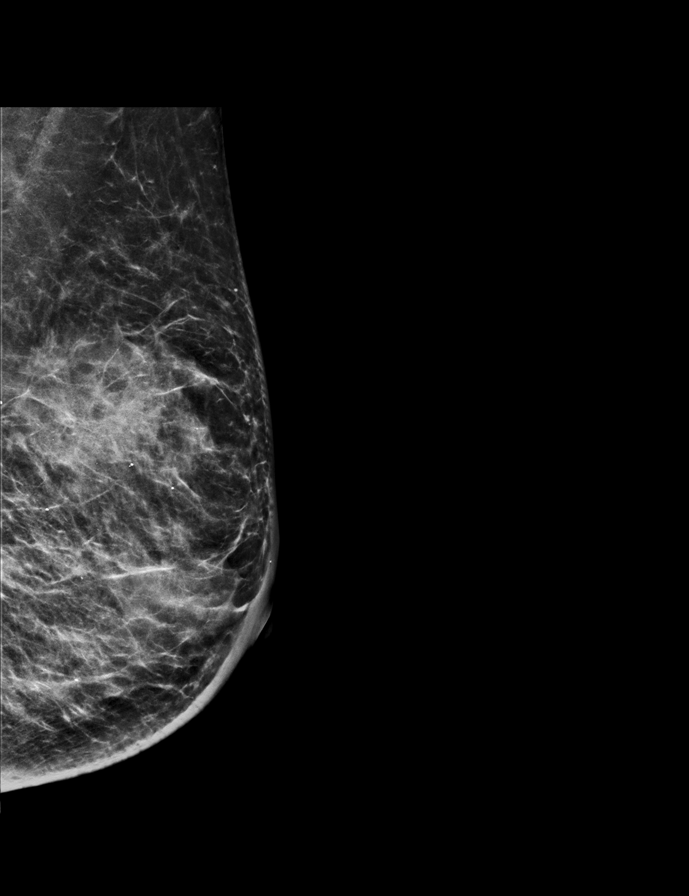

[L CC synth-2D]
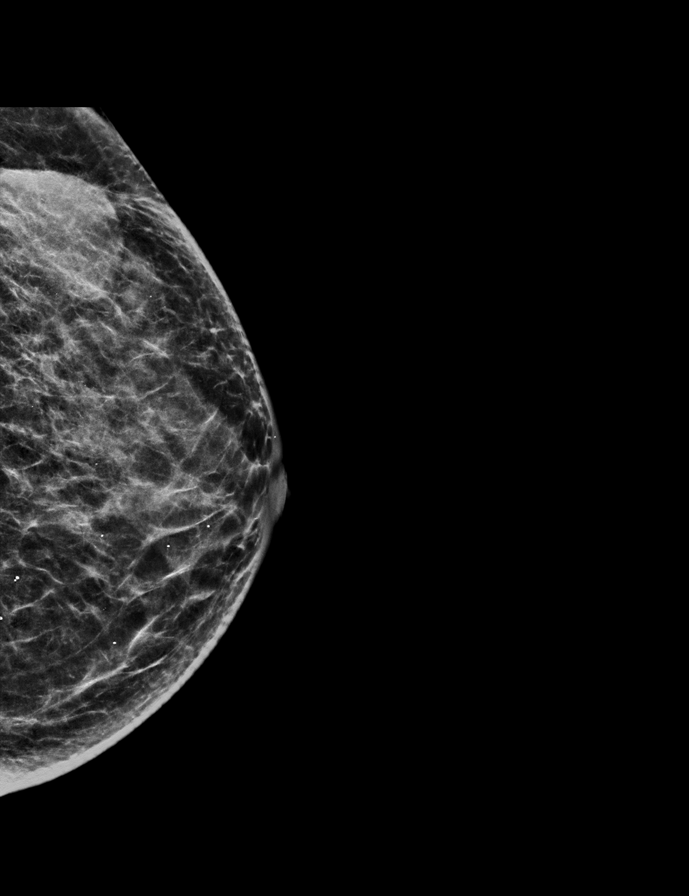

[L CC]
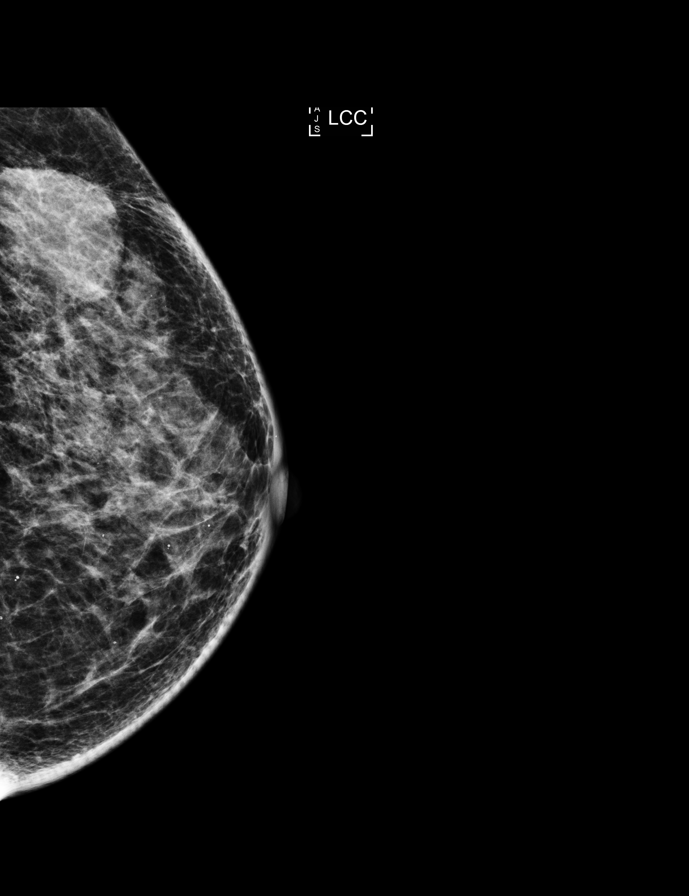

[R CC]
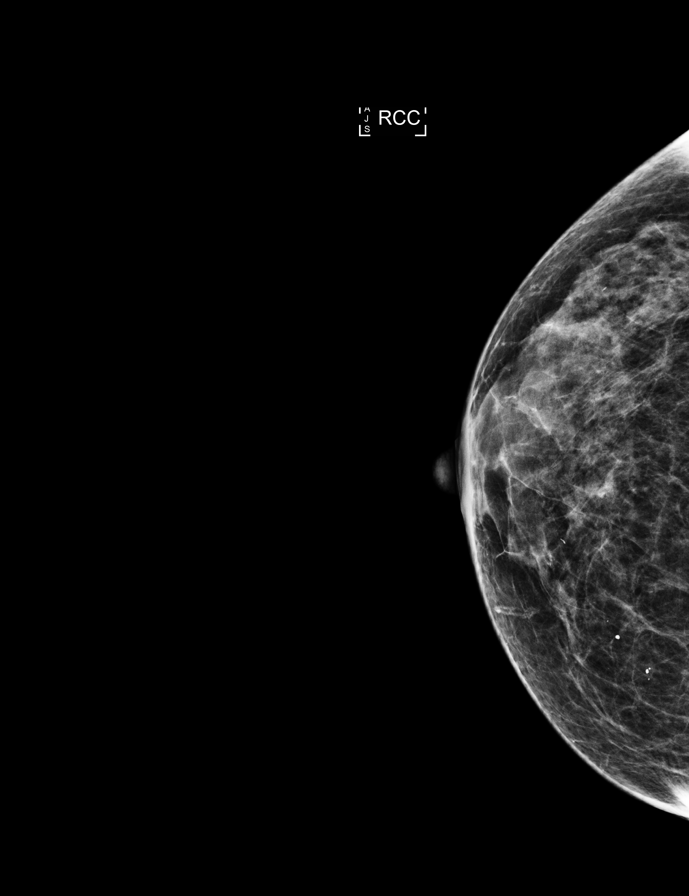

[L MLO]
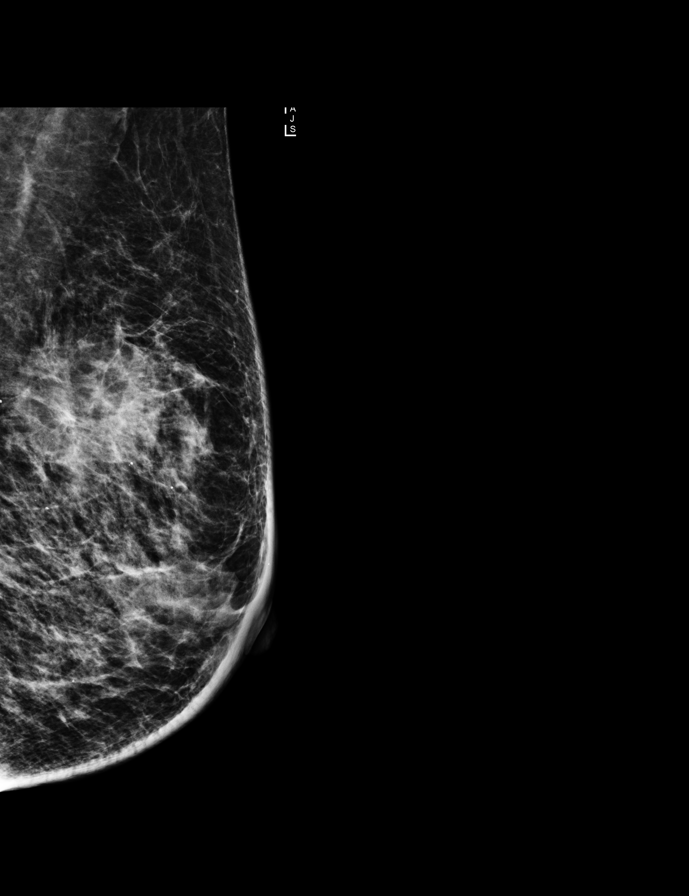

[9 of 29 positions shown; findings below may reference images not displayed]

ACR Breast Density Category c: The breast tissue is heterogeneously
dense, which may obscure small masses.
FINDINGS: Focal opacity in the upper outer left breast is consistent with
postoperative change including scarring and residual hematoma. There
are no discrete, suspicious masses, no other areas of architectural
distortion and no suspicious calcifications.

Mammographic images were processed with CAD.
IMPRESSION: No evidence of recurrent or new breast malignancy. Benign
postsurgical changes on the left.

RECOMMENDATION:
Diagnostic mammography in 1 year per standard post lumpectomy
protocol.

I have discussed the findings and recommendations with the patient.
Results were also provided in writing at the conclusion of the
visit. If applicable, a reminder letter will be sent to the patient
regarding the next appointment.

BI-RADS CATEGORY  2: Benign.

## 2017-05-14 DIAGNOSIS — M47812 Spondylosis without myelopathy or radiculopathy, cervical region: Secondary | ICD-10-CM | POA: Insufficient documentation

## 2017-06-03 ENCOUNTER — Ambulatory Visit: Payer: Medicare Other | Admitting: Internal Medicine

## 2017-06-04 ENCOUNTER — Encounter: Payer: Self-pay | Admitting: Oncology

## 2017-06-04 ENCOUNTER — Inpatient Hospital Stay: Payer: Medicare Other | Attending: Internal Medicine | Admitting: Oncology

## 2017-06-04 VITALS — BP 145/75 | HR 80 | Temp 97.8°F | Resp 18 | Wt 133.0 lb

## 2017-06-04 DIAGNOSIS — Z9223 Personal history of estrogen therapy: Secondary | ICD-10-CM | POA: Diagnosis not present

## 2017-06-04 DIAGNOSIS — Z923 Personal history of irradiation: Secondary | ICD-10-CM

## 2017-06-04 DIAGNOSIS — Z79899 Other long term (current) drug therapy: Secondary | ICD-10-CM

## 2017-06-04 DIAGNOSIS — D0592 Unspecified type of carcinoma in situ of left breast: Secondary | ICD-10-CM

## 2017-06-04 DIAGNOSIS — Z7984 Long term (current) use of oral hypoglycemic drugs: Secondary | ICD-10-CM | POA: Diagnosis not present

## 2017-06-04 DIAGNOSIS — I10 Essential (primary) hypertension: Secondary | ICD-10-CM | POA: Diagnosis not present

## 2017-06-04 DIAGNOSIS — Z86000 Personal history of in-situ neoplasm of breast: Secondary | ICD-10-CM

## 2017-06-04 DIAGNOSIS — Z170 Estrogen receptor positive status [ER+]: Secondary | ICD-10-CM

## 2017-06-04 DIAGNOSIS — M069 Rheumatoid arthritis, unspecified: Secondary | ICD-10-CM

## 2017-06-04 DIAGNOSIS — E785 Hyperlipidemia, unspecified: Secondary | ICD-10-CM | POA: Diagnosis not present

## 2017-06-04 DIAGNOSIS — E119 Type 2 diabetes mellitus without complications: Secondary | ICD-10-CM | POA: Diagnosis not present

## 2017-06-04 NOTE — Progress Notes (Signed)
No new changes noted today 

## 2017-06-06 NOTE — Progress Notes (Signed)
Hematology/Oncology Consult note [facility]  Telephone:([phone] [phone]  Patient Care Team: Madelyn Brunner, MD as PCP - General (Internal Medicine) Madelyn Brunner, MD (Internal Medicine)   Name of the patient: Ashley Bray  [id number]  1941-03-18   Date of visit: 06/06/17  Diagnosis- left breast DCIS  Chief complaint/ Reason for visit- routine f/u of DCIS  Heme/Onc history: 77 year old female with a history of stage 0 (Tis N0 M0) ductal carcinoma in situ of the left breast status post wide local excision for adjuvant radiation therapy.(August, 2015) estrogen receptor is positive. Progesterone receptor is positive. Patient was taken off tamoxifen because of unspecified bone pain on April 19, 2014. Abnormal CT scan of the chest (March, 2016). Bronchoscopy was done which revealed no evidence of malignancy. All the cultures are negative. 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 Patient cannot take tamoxifen and has discontinued on her own accord   Interval history- overall she is doing well. Her appetite is good. Denies any fatigue, unintentional weight loss or aches and pains anywhere  ECOG PS- 0 Pain scale- 0   Review of systems- Review of Systems  Constitutional: Negative for chills, fever, malaise/fatigue and weight loss.  HENT: Negative for congestion, ear discharge and nosebleeds.   Eyes: Negative for blurred vision.  Respiratory: Negative for cough, hemoptysis, sputum production, shortness of breath and wheezing.   Cardiovascular: Negative for chest pain, palpitations, orthopnea and claudication.  Gastrointestinal: Negative for abdominal pain, blood in stool, constipation, diarrhea, heartburn, melena, nausea and vomiting.  Genitourinary: Negative for dysuria, flank pain, frequency, hematuria and urgency.  Musculoskeletal: Negative for back pain, joint pain  and myalgias.  Skin: Negative for rash.  Neurological: Negative for dizziness, tingling, focal weakness, seizures, weakness and headaches.  Endo/Heme/Allergies: Does not bruise/bleed easily.  Psychiatric/Behavioral: Negative for depression and suicidal ideas. The patient does not have insomnia.       Allergies  Allergen Reactions  . Codeine Nausea And Vomiting     Past Medical History:  Diagnosis Date  . Breast CA ([place])    left  . Breast cancer ([place]) 2015   Ductal carcinoma in situ of the left breast. Had radiation tx  . Diabetes ([place])   . HLD (hyperlipidemia)   . Hypertension    NO MEDS  . Pneumonia    SMALL SPOT ON LUNG, HX RADIATION PNEUMONITIS   . PONV (postoperative nausea and vomiting)   . RA (rheumatoid arthritis) ([place])   . Radiation pneumonitis [facility])      Past Surgical History:  Procedure Laterality Date  . ABDOMINAL HYSTERECTOMY  1991  . ANTERIOR CERVICAL DECOMP/DISCECTOMY FUSION N/A 03/16/2015   Procedure: ANTERIOR  CERVICAL DECOMPRESSION FUSION CERVICAL TWO-THREE,CERVICAL THREE-FOUR WITH REMOVAL OF HARDWARE AT CERVICAL FOUR-FIVE.;  Surgeon: Earnie Larsson, MD;  Location: [place] [facility];  Service: Neurosurgery;  Laterality: N/A;  . APPENDECTOMY    . BREAST BIOPSY Left 08/31/2013   positive  . BREAST LUMPECTOMY Left 10/06/2013 & 11/03/2013  . CARPAL TUNNEL RELEASE Bilateral   . neck fusion    . NECK SURGERY  10/2016  . REPLACEMENT TOTAL KNEE BILATERAL     x2  . right shoulder replacement Right   . ROTATOR CUFF REPAIR Right     Social History   Socioeconomic History  . Marital status: Married    Spouse name: Not on file  . Number of children: Not on file  .  Years of education: Not on file  . Highest education level: Not on file  Occupational History  . Not on file  Social Needs  . Financial resource strain: Not on file  . Food insecurity:    Worry: Not on file    Inability: Not on file  . Transportation needs:    Medical: Not on file    Non-medical:  Not on file  Tobacco Use  . Smoking status: Never Smoker  . Smokeless tobacco: Never Used  Substance and Sexual Activity  . Alcohol use: Yes    Alcohol/week: 1.2 oz    Types: 2 Glasses of wine per week    Comment: occasional wine  . Drug use: No  . Sexual activity: Not on file  Lifestyle  . Physical activity:    Days per week: Not on file    Minutes per session: Not on file  . Stress: Not on file  Relationships  . Social connections:    Talks on phone: Not on file    Gets together: Not on file    Attends religious service: Not on file    Active member of club or organization: Not on file    Attends meetings of clubs or organizations: Not on file    Relationship status: Not on file  . Intimate partner violence:    Fear of current or ex partner: Not on file    Emotionally abused: Not on file    Physically abused: Not on file    Forced sexual activity: Not on file  Other Topics Concern  . Not on file  Social History Narrative  . Not on file    Family History  Problem Relation Age of Onset  . Cancer Mother        kidney or bladder-unsure of which one  . Cancer Brother 30       lung  . Cancer Brother 3       lung     Current Outpatient Medications:  .  acetaminophen (TYLENOL) 500 MG tablet, Take 500 mg by mouth every 8 (eight) hours as needed for mild pain or moderate pain., Disp: , Rfl:  .  AMBULATORY NON FORMULARY MEDICATION, Medication Name: incentive spirometry  Use as directed, Disp: 1 each, Rfl: 0 .  ANORO ELLIPTA 62.5-25 MCG/INH AEPB, INHALE 1 PUFF INTO THE LUNGS DAILY (Patient not taking: Reported on 06/01/2016), Disp: 60 each, Rfl: 5 .  benzonatate (TESSALON) 100 MG capsule, Take 1 capsule by mouth 3 (three) times daily as needed for cough. , Disp: , Rfl: 0 .  Blood Glucose Monitoring Suppl (ONE TOUCH ULTRA 2) W/DEVICE KIT, , Disp: , Rfl: 11 .  cetirizine (ZYRTEC ALLERGY) 10 MG tablet, Take 1 tablet (10 mg total) by mouth daily., Disp: 30 tablet, Rfl: 12 .   cyclobenzaprine (FLEXERIL) 10 MG tablet, Take 1 tablet (10 mg total) by mouth 3 (three) times daily as needed for muscle spasms. (Patient not taking: Reported on 06/01/2016), Disp: 30 tablet, Rfl: 0 .  fluticasone (FLONASE) 50 MCG/ACT nasal spray, Place 1 spray into both nostrils 2 (two) times daily., Disp: , Rfl:  .  folic acid (FOLVITE) 1 MG tablet, Take 1 mg by mouth daily., Disp: , Rfl: 11 .  gabapentin (NEURONTIN) 100 MG capsule, Take 100 mg by mouth 3 (three) times daily., Disp: , Rfl:  .  GLUCAGON EMERGENCY 1 MG injection, Inject 1 mg into the muscle once as needed. , Disp: , Rfl: 0 .  hydroxychloroquine (PLAQUENIL)  200 MG tablet, Take 200 mg by mouth daily., Disp: , Rfl:  .  meloxicam (MOBIC) 7.5 MG tablet, meloxicam 7.5 mg tablet  Take 1 tablet every day by oral route., Disp: , Rfl:  .  metFORMIN (GLUCOPHAGE) 500 MG tablet, Take 1,000 mg by mouth 2 (two) times daily with a meal., Disp: , Rfl:  .  montelukast (SINGULAIR) 10 MG tablet, Take 1 tablet (10 mg total) by mouth at bedtime. (Patient not taking: Reported on 06/01/2016), Disp: 30 tablet, Rfl: 1 .  pravastatin (PRAVACHOL) 40 MG tablet, Take 40 mg by mouth daily., Disp: , Rfl:  .  Respiratory Therapy Supplies (FLUTTER) DEVI, 1 each by Does not apply route once. (Patient not taking: Reported on 06/01/2016), Disp: 1 each, Rfl: 0 .  vitamin B-12 (CYANOCOBALAMIN) 1000 MCG tablet, Take 1,000 mcg by mouth 2 (two) times daily., Disp: , Rfl:   Physical exam:  Vitals:   06/04/17 1450  BP: (!) 145/75  Pulse: 80  Resp: 18  Temp: 97.8 F (36.6 C)  TempSrc: Tympanic  SpO2: 97%  Weight: 133 lb (60.3 kg)   Physical Exam  Constitutional: She is oriented to person, place, and time and well-developed, well-nourished, and in no distress.  HENT:  Head: Normocephalic and atraumatic.  Eyes: Pupils are equal, round, and reactive to light. EOM are normal.  Neck: Normal range of motion.  Cardiovascular: Normal rate, regular rhythm and normal heart  sounds.  Pulmonary/Chest: Effort normal and breath sounds normal.  Abdominal: Soft. Bowel sounds are normal.  Neurological: She is alert and oriented to person, place, and time.  Skin: Skin is warm and dry.   Breast exam was performed in seated and lying down position. Patient is status post left lumpectomy with a well-healed surgical scar. No evidence of any palpable masses. No evidence of axillary adenopathy. No evidence of any palpable masses or lumps in the right breast. No evidence of right axillary adenopathy  CMP Latest Ref Rng & Units 06/01/2016  Glucose 65 - 99 mg/dL 124(H)  BUN 6 - 20 mg/dL 12  Creatinine 0.44 - 1.00 mg/dL 0.47  Sodium 135 - 145 mmol/L 132(L)  Potassium 3.5 - 5.1 mmol/L 4.3  Chloride 101 - 111 mmol/L 95(L)  CO2 22 - 32 mmol/L 31  Calcium 8.9 - 10.3 mg/dL 9.4  Total Protein 6.5 - 8.1 g/dL 7.6  Total Bilirubin 0.3 - 1.2 mg/dL 0.8  Alkaline Phos 38 - 126 U/L 54  AST 15 - 41 U/L 28  ALT 14 - 54 U/L 16   CBC Latest Ref Rng & Units 06/01/2016  WBC 3.6 - 11.0 K/uL 5.5  Hemoglobin 12.0 - 16.0 g/dL 13.8  Hematocrit 35.0 - 47.0 % 40.8  Platelets 150 - 440 K/uL 291     Assessment and plan- Patient is a 77 y.o. female a h/o left breast DCIS s/p lumpectomy and radiation in 2015. She took hormone therapy briefly but came off it due to arthralgias. Here for surveillance visit for her breast cancer  Clinically patient is doing well. No evidence of recurrence on todays exam. Mammogram from June 2018 showed no evidence of malignancy. We will schedule repeat mammogram for this year. I will see her back in 1 year. No labs   Visit Diagnosis 1. Carcinoma in situ of left breast, unspecified type      Dr. Randa Evens, MD, MPH [facility] Pager- [phone] 06/06/2017 2:45 PM

## 2017-06-27 IMAGING — CR DG CHEST 2V
1 series · 2 of 2 positions shown · non-contrast
Comparison: 07/05/2014

CLINICAL DATA: Breast carcinoma post XRT. Left chest pain. Blunt
trauma 3 weeks ago.

EXAM:
CHEST - 2 VIEW

[Series 1: dg chest 2 view · 0.14mm/px · 2 of 2 slices shown]
[im 1/2]
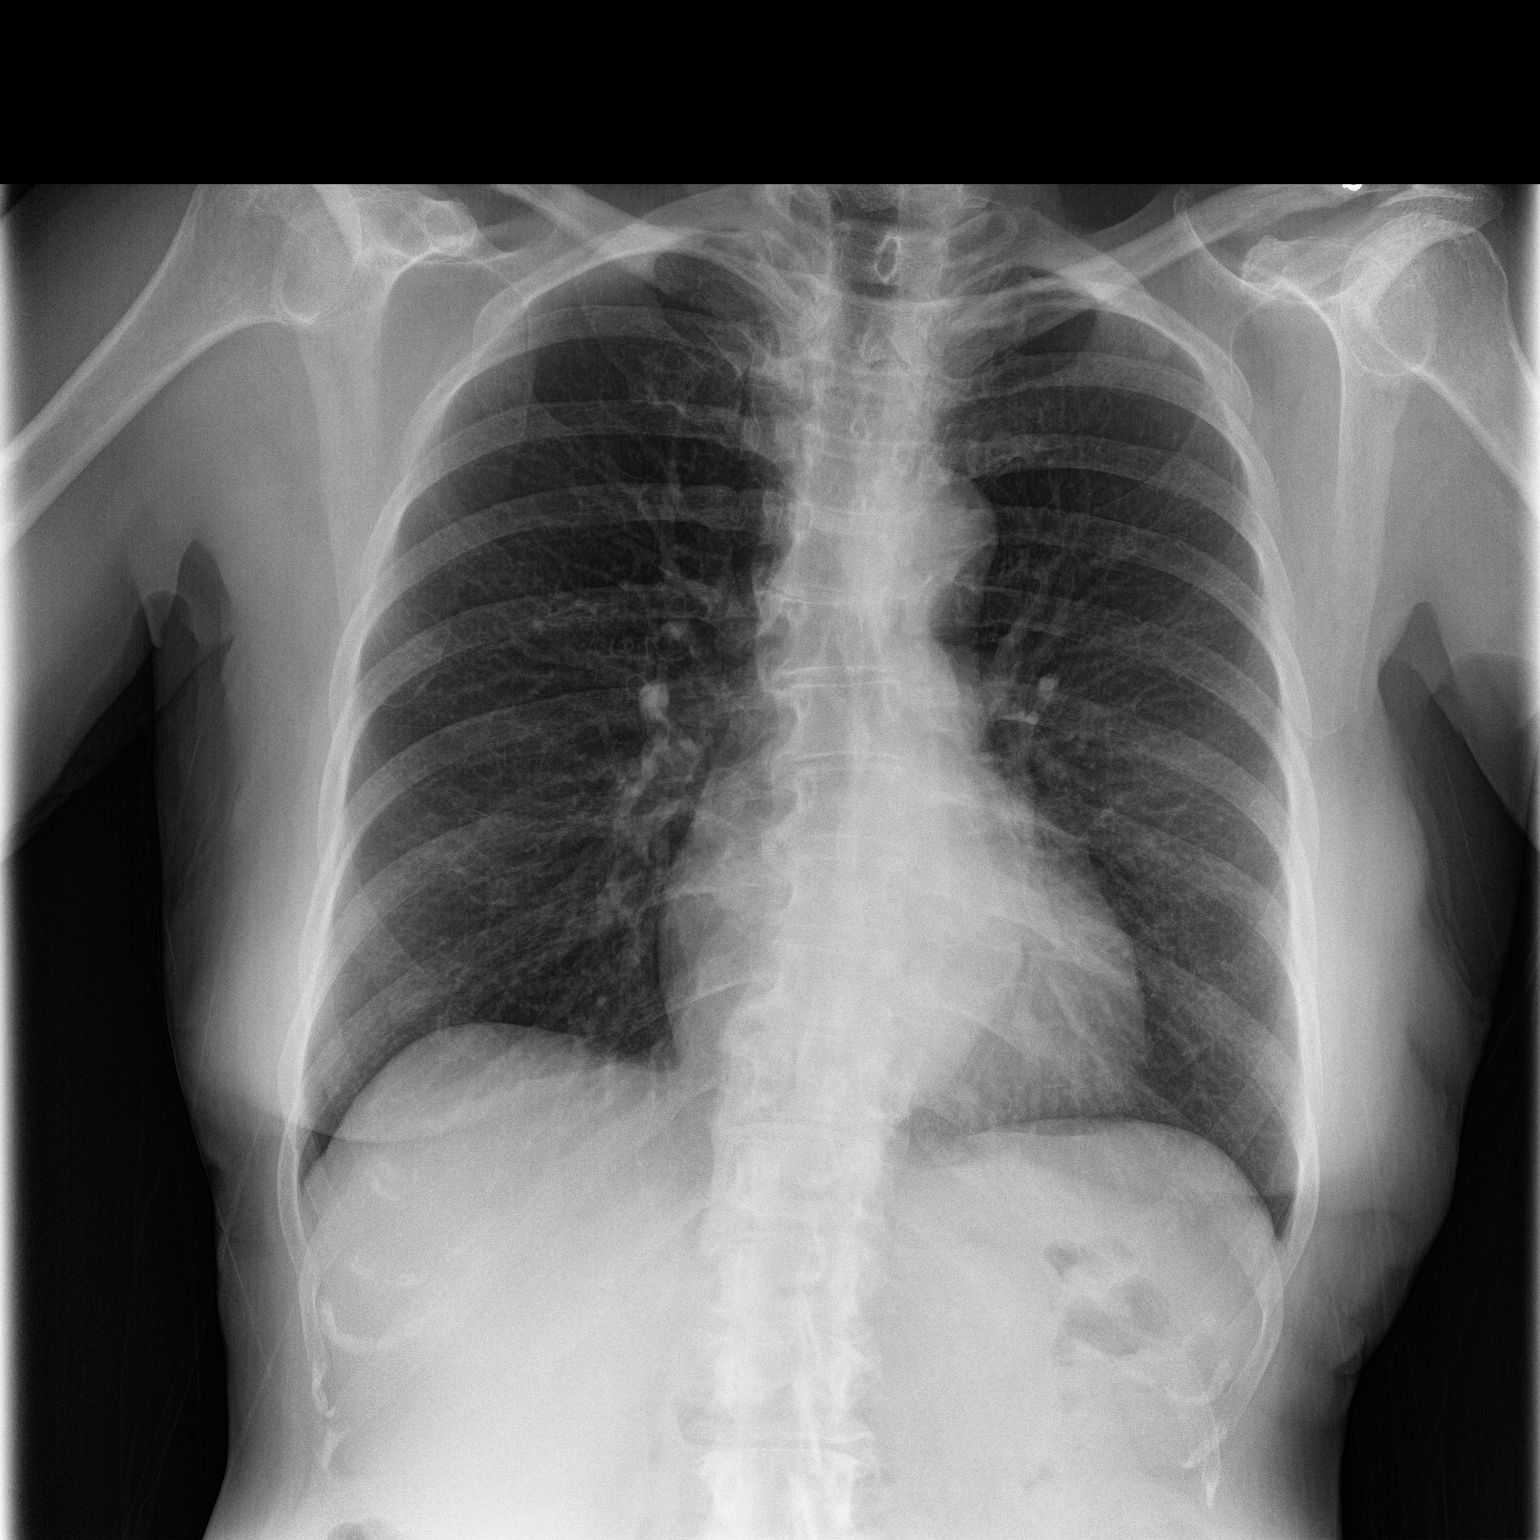
[im 2/2]
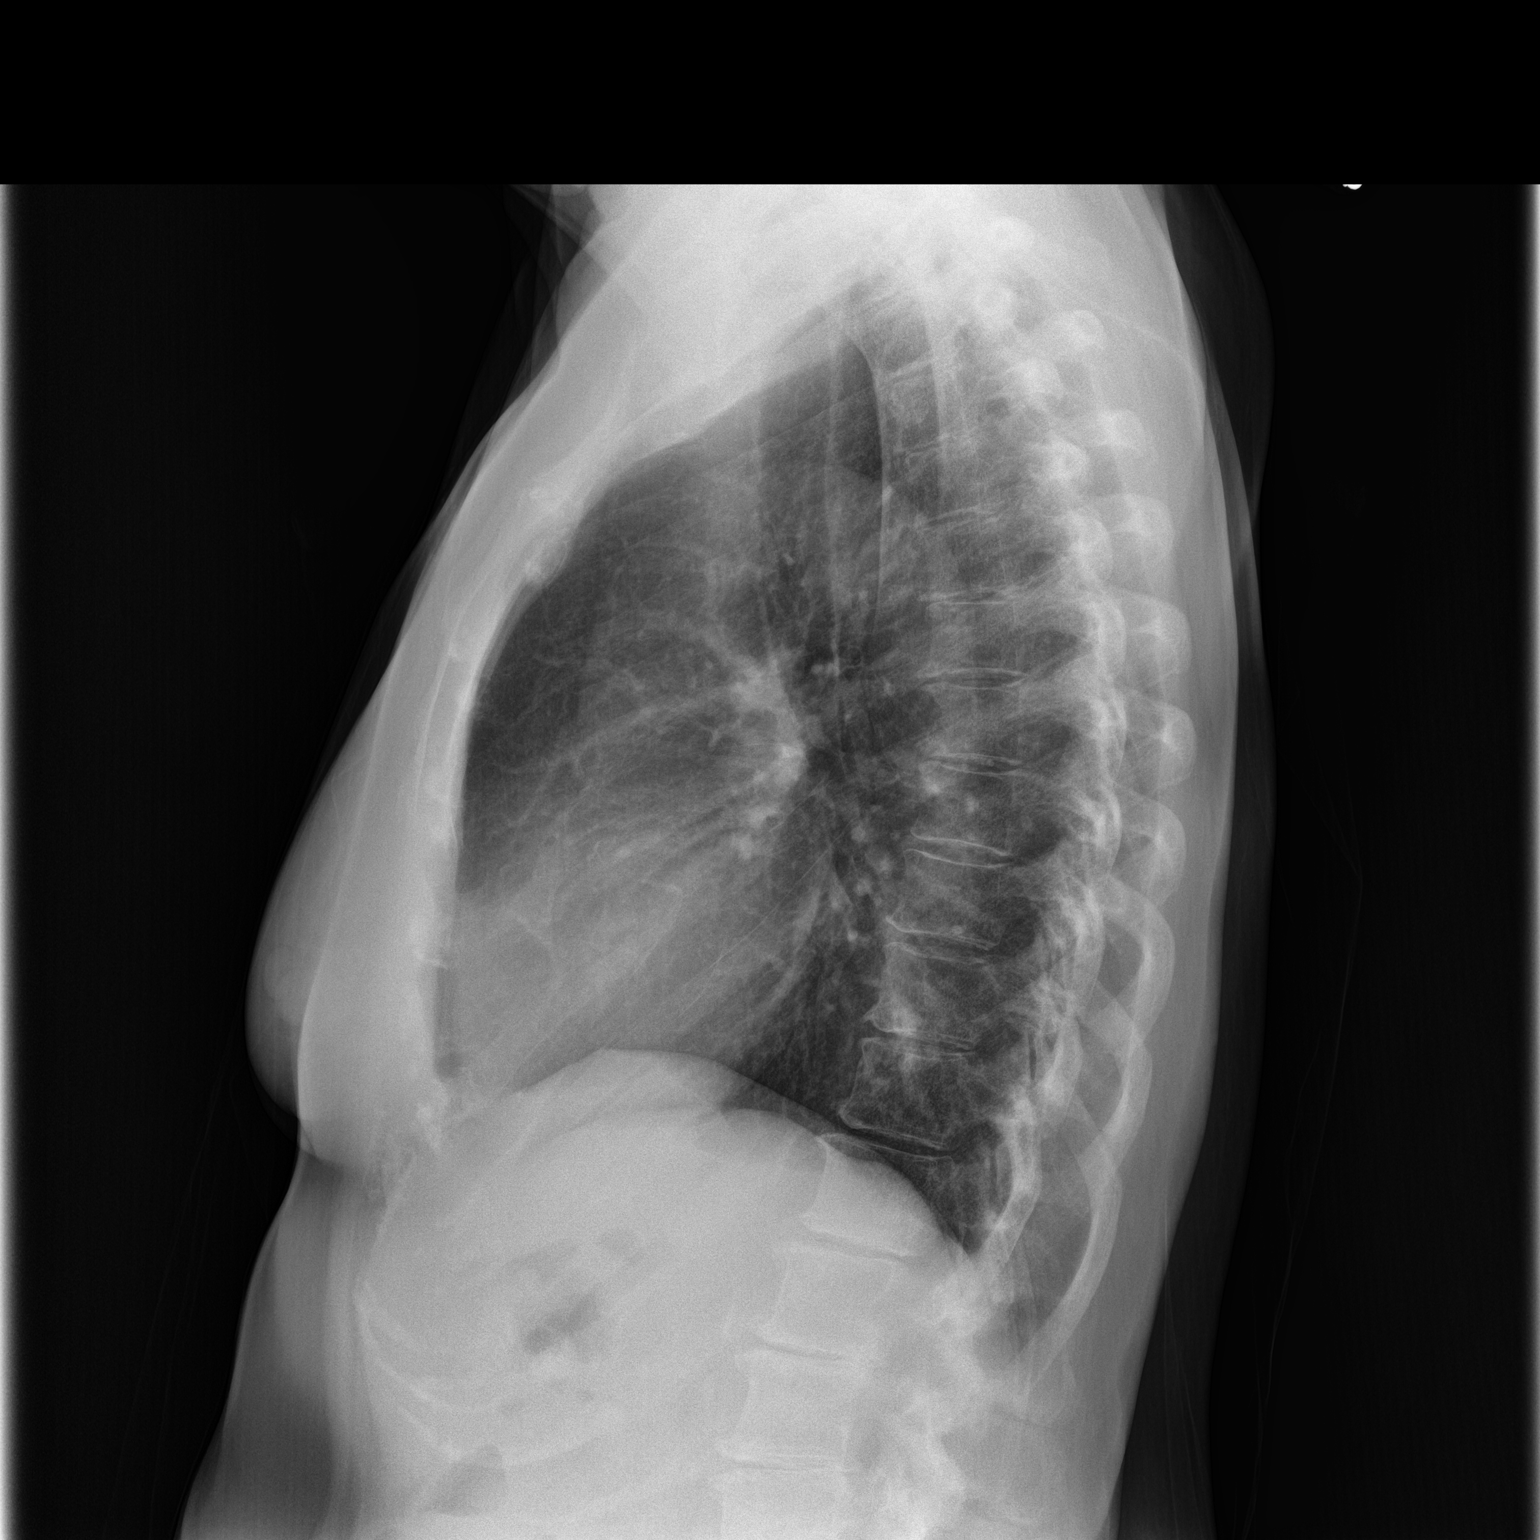

[2 of 2 positions shown; findings below may reference images not displayed]

FINDINGS: Lungs are clear. The left lower lung infiltrates seen previously has
resolved. Heart size and mediastinal contours are within normal
limits. Tortuous thoracic aorta. No pneumothorax.
No effusion.
Cervical fixation hardware noted. Minimal spurring in the lower
thoracic spine.
IMPRESSION: No acute cardiopulmonary disease.

## 2017-08-07 ENCOUNTER — Encounter: Payer: Self-pay | Admitting: Radiation Oncology

## 2017-08-07 ENCOUNTER — Other Ambulatory Visit: Payer: Self-pay

## 2017-08-07 ENCOUNTER — Ambulatory Visit
Admission: RE | Admit: 2017-08-07 | Discharge: 2017-08-07 | Disposition: A | Discharge status: Home / Self Care | Payer: Medicare Other | Source: Ambulatory Visit | Attending: Radiation Oncology | Admitting: Radiation Oncology

## 2017-08-07 VITALS — BP 139/73 | HR 80 | Temp 97.5°F | Resp 18 | Wt 130.7 lb

## 2017-08-07 DIAGNOSIS — C50912 Malignant neoplasm of unspecified site of left female breast: Secondary | ICD-10-CM

## 2017-08-07 DIAGNOSIS — Z923 Personal history of irradiation: Secondary | ICD-10-CM | POA: Diagnosis not present

## 2017-08-07 DIAGNOSIS — L819 Disorder of pigmentation, unspecified: Secondary | ICD-10-CM | POA: Diagnosis not present

## 2017-08-07 DIAGNOSIS — Z86000 Personal history of in-situ neoplasm of breast: Secondary | ICD-10-CM | POA: Insufficient documentation

## 2017-08-07 DIAGNOSIS — Z170 Estrogen receptor positive status [ER+]: Secondary | ICD-10-CM

## 2017-08-07 NOTE — Progress Notes (Signed)
Radiation Oncology Follow up Note  Name: Ashley Bray   Date:   08/07/2017 MRN:  [id number] DOB: 1940/05/03    This 77 y.o. 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 She is seen today in routine follow-up and is doing well. She had some unusual hyperpigmentation may be a fungus in the left axilla which did respond to topical treatment. She otherwise specifically denies breast tenderness cough or bone pain.Marland Kitchenshe was originally on antiestrogen therapy although this was discontinued based on arthralgias.she scheduled for follow-up mammograms in [place] last mammogram last June I have reviewed was a BI-RADS 2 benign.  COMPLICATIONS OF TREATMENT: none  FOLLOW UP COMPLIANCE: keeps appointments   PHYSICAL EXAM:  BP 139/73   Pulse 80   Temp (!) 97.5 F (36.4 C)   Resp 18   Wt 130 lb 11.7 oz (59.3 kg)   BMI 23.91 kg/m  No dominant mass or nodularity is noted in either breast in 2 positions examined. In the left axilla there is some still hyperpigmented skin not sure of the etiology. There is no axillary or supra clavicular adenopathy noted. Well-developed well-nourished patient in NAD. HEENT reveals PERLA, EOMI, discs not visualized.  Oral cavity is clear. No oral mucosal lesions are identified. Neck is clear without evidence of cervical or supraclavicular adenopathy. Lungs are clear to A&P. Cardiac examination is essentially unremarkable with regular rate and rhythm without murmur rub or thrill. Abdomen is benign with no organomegaly or masses noted. Motor sensory and DTR levels are equal and symmetric in the upper and lower extremities. Cranial nerves II through XII are grossly intact.  Proprioception is intact. No peripheral adenopathy or edema is identified. No motor or sensory levels are noted. Crude visual fields are within normal range.  RADIOLOGY RESULTS: previous mammograms reviewed and compatible above-stated findings  PLAN: present time she continues to do one with no evidence of disease now 3.5 years out. I see her back in 1 year for follow-up and then discontinue follow-up care. She is a rescheduled for follow-up mammograms next month. Patient knows to call with any concerns.  I would like to take this opportunity to thank you for allowing me to participate in the care of your patient.Noreene Filbert, MD

## 2017-09-02 ENCOUNTER — Other Ambulatory Visit: Payer: Medicare Other

## 2017-09-13 ENCOUNTER — Ambulatory Visit
Admission: RE | Admit: 2017-09-13 | Discharge: 2017-09-13 | Disposition: A | Discharge status: Home / Self Care | Payer: Medicare Other | Source: Ambulatory Visit | Attending: Oncology | Admitting: Oncology

## 2017-09-13 DIAGNOSIS — D0592 Unspecified type of carcinoma in situ of left breast: Secondary | ICD-10-CM

## 2017-09-13 HISTORY — DX: Personal history of irradiation: Z92.3

## 2017-09-20 IMAGING — MR MR CERVICAL SPINE W/O CM
5 series · 43 of 48 positions shown · non-contrast
Comparison: None.

CLINICAL DATA: Cervical spondylosis without myelopathy. Prior
cervical fusion. Neck pain at the base of the head. No reported
radicular complaints. Limited range of motion.

EXAM:
MRI CERVICAL SPINE WITHOUT CONTRAST
TECHNIQUE: Multiplanar, multisequence MR imaging of the cervical spine was
performed. No intravenous contrast was administered.

[Series 2: T2 · sagittal · 3.0mm · 0.70mm/px · 8 of 13 slices shown (1 of 2)]
[im 1/13]
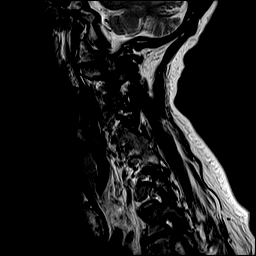
[im 2/13]
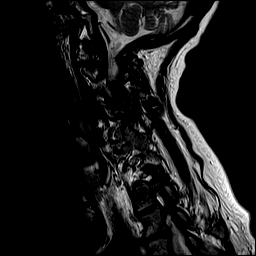
[im 4/13]
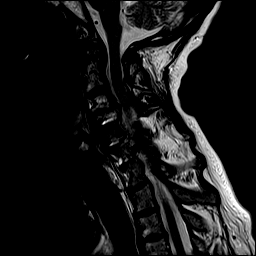
[im 6/13]
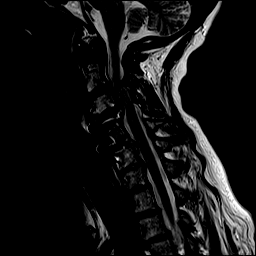
[im 7/13]
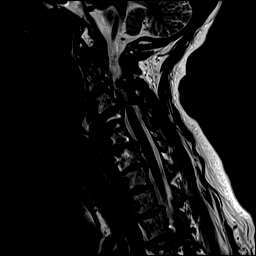
[im 9/13]
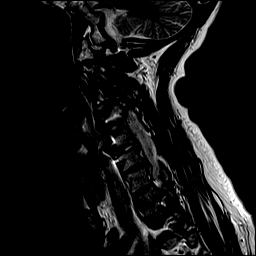
[im 11/13]
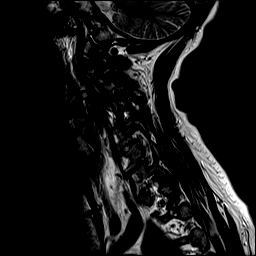
[im 13/13]
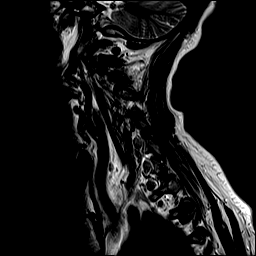

[Series 3: T1 · sagittal · 3.0mm · 0.70mm/px · 7 of 13 slices shown]
[im 1/13]
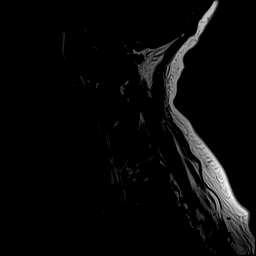
[im 3/13]
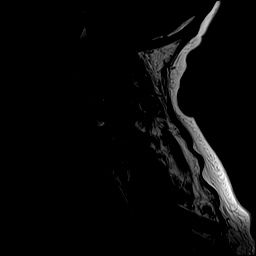
[im 5/13]
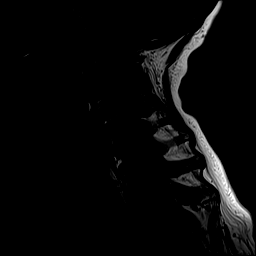
[im 7/13]
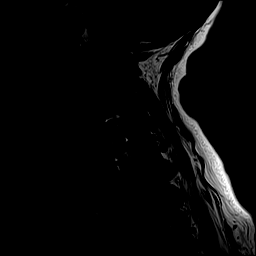
[im 9/13]
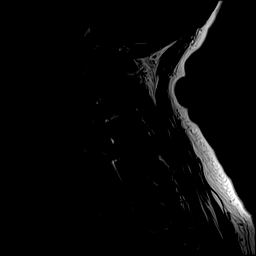
[im 11/13]
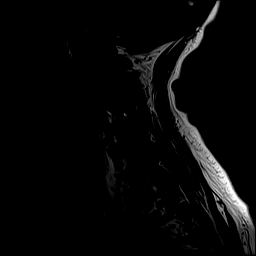
[im 13/13]
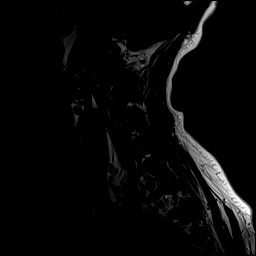

[Series 4: STIR · sagittal · 3.0mm · 0.70mm/px · 7 of 13 slices shown]
[im 1/13]
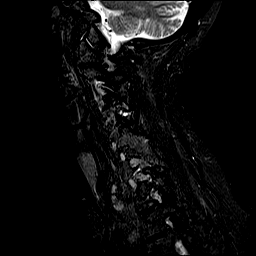
[im 3/13]
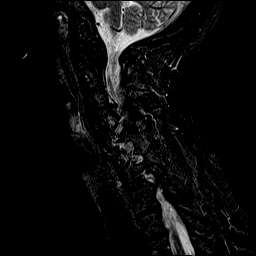
[im 5/13]
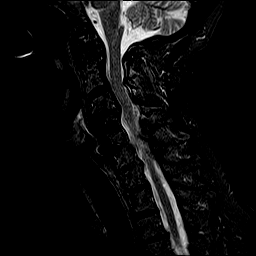
[im 7/13]
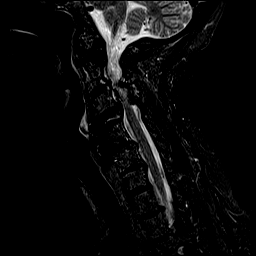
[im 9/13]
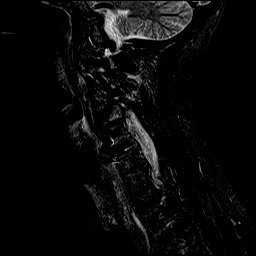
[im 11/13]
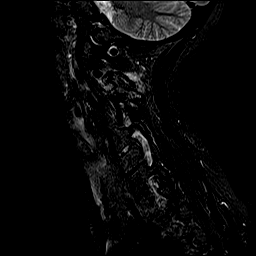
[im 13/13]
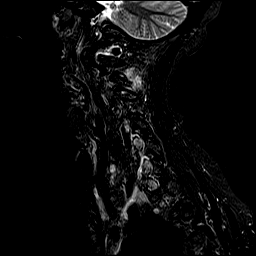

[Series 5: T2 · axial · 3.0mm · 0.70mm/px · z∈[-54,+35]mm · 13 of 25 slices shown (2 of 2)]
[im 1/25]
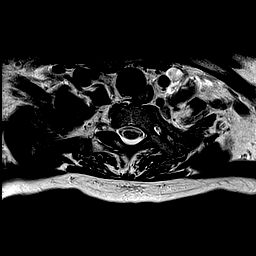
[im 3/25]
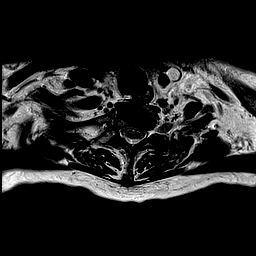
[im 5/25]
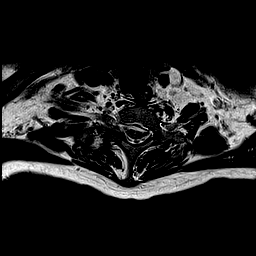
[im 7/25]
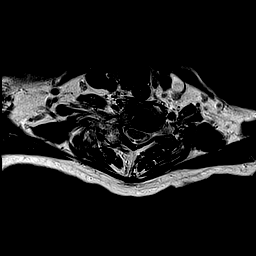
[im 9/25]
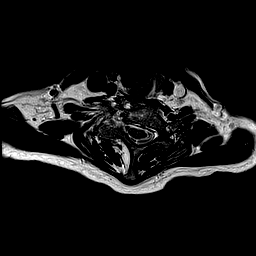
[im 11/25]
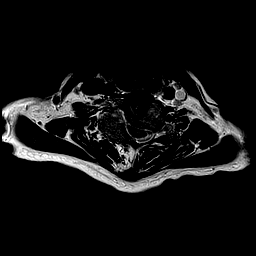
[im 13/25]
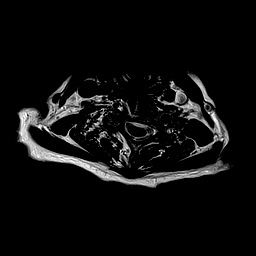
[im 15/25]
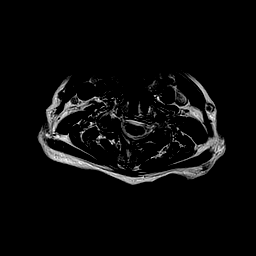
[im 17/25]
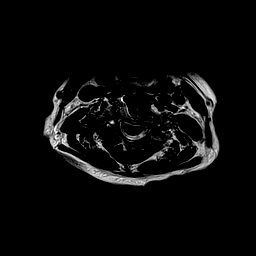
[im 19/25]
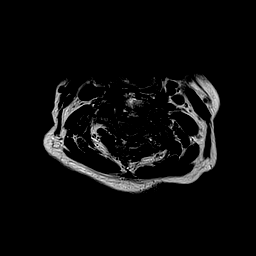
[im 21/25]
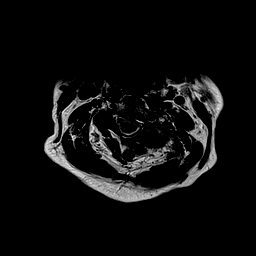
[im 23/25]
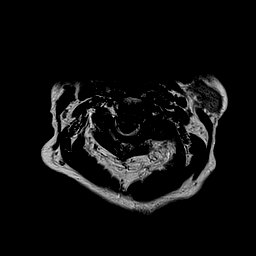
[im 25/25]
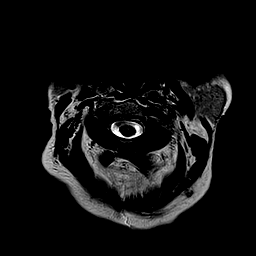

[Series 6: mpgr ax · axial · 3.0mm · 0.35mm/px · z∈[-54,+35]mm · 8 of 25 slices shown]
[im 1/25]
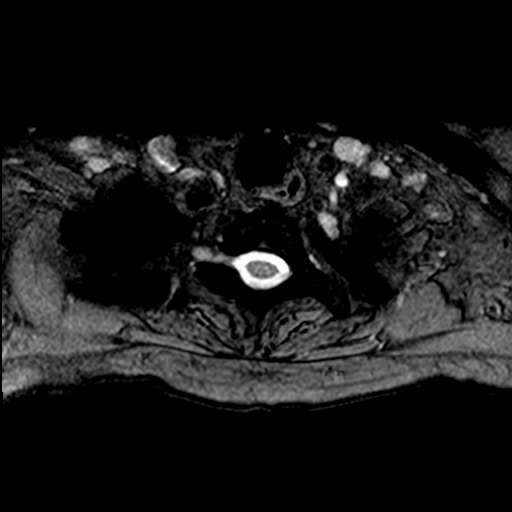
[im 5/25]
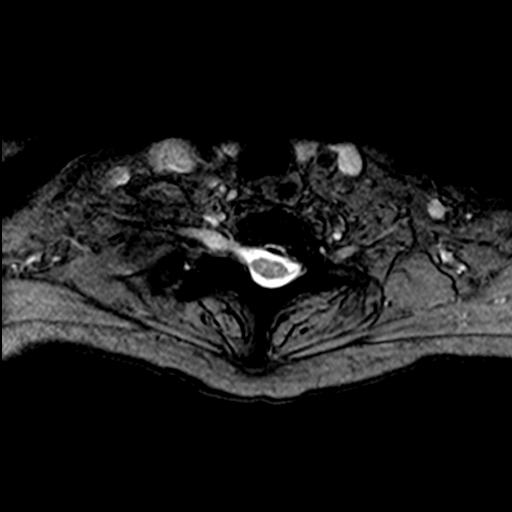
[im 9/25]
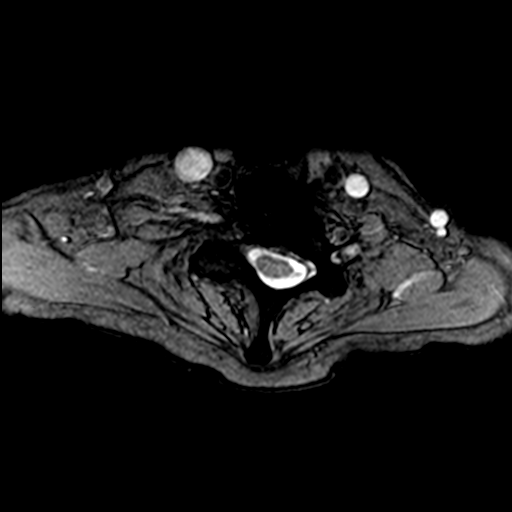
[im 11/25]
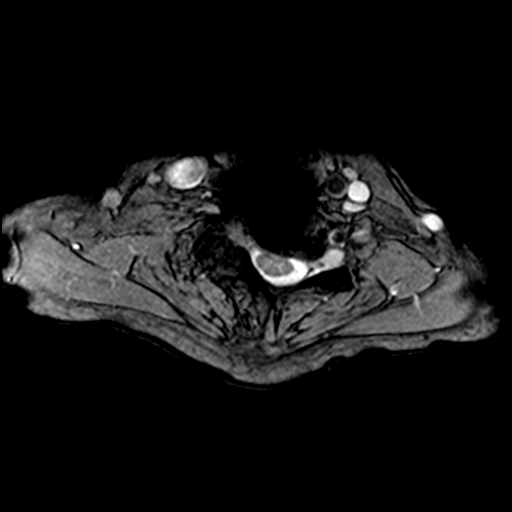
[im 15/25]
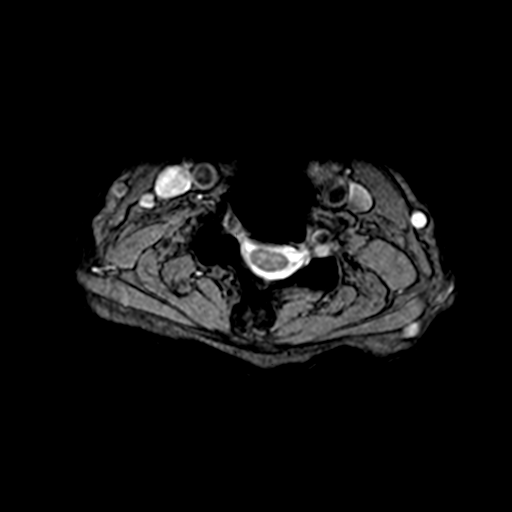
[im 17/25]
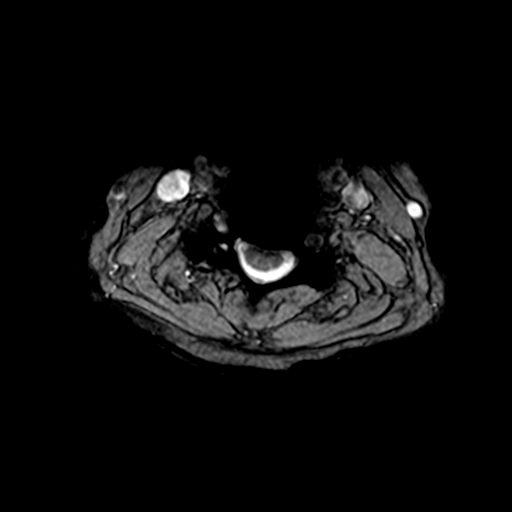
[im 21/25]
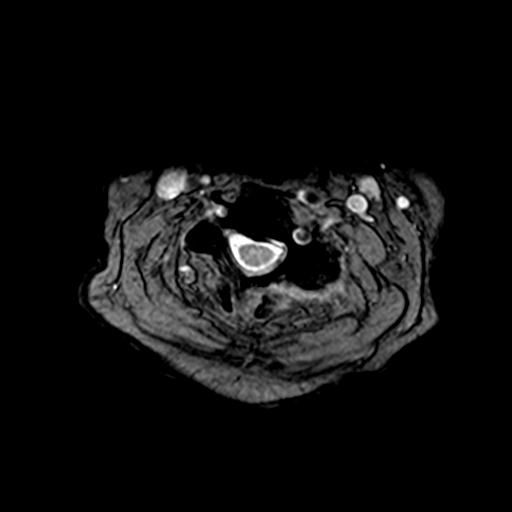
[im 25/25]
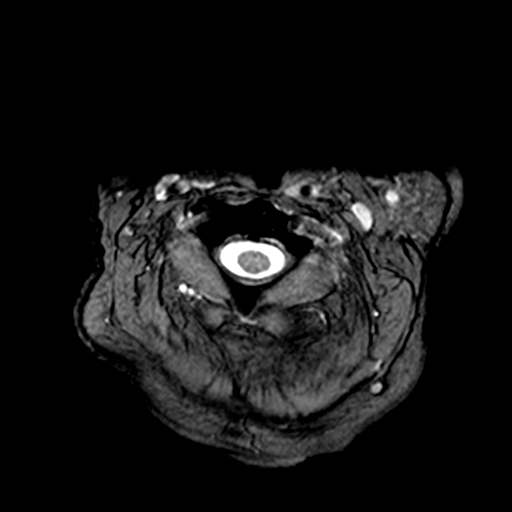

[43 of 48 positions shown; findings below may reference images not displayed]

FINDINGS: There is straightening of the normal cervical lordosis. Sequelae of
prior C4-5 ACDF are identified with solid-appearing fusion. Moderate
disc space narrowing is present at C6-7. There is solid fusion
across the facet joints on the right at C5-6 and C6-7. Marrow and
periarticular soft tissue edema is noted associated with advanced
left-sided facet arthritis at C2-3. Mild-to-moderate degenerative
changes are noted about the dens. Cervical spinal cord is normal in
signal.

C2-3: Mild right and severe left facet arthrosis result in moderate
left neural foraminal stenosis. There is infolding of the ligamentum
flavum with borderline spinal stenosis.

C3-4: Broad-based posterior disc osteophyte complex and severe right
greater than left facet arthrosis result in mild spinal stenosis and
moderate bilateral neural foraminal stenosis.

C4-5:  Prior ACDF.  No significant stenosis.

C5-6: Partial fusion across the disc space. No significant stenosis.

C6-7: Broad-based posterior disc osteophyte complex without
significant stenosis.

C7-T1: Moderate right greater than left facet arthrosis without
significant stenosis.
IMPRESSION: 1. Prior C4-5 ACDF without significant residual stenosis.
2. Moderate left neural foraminal stenosis at C2-3 due to severe
left facet arthritis.
3. Mild spinal stenosis and moderate bilateral foraminal stenosis at
C3-4 due to moderate disc and severe facet degeneration.

## 2017-09-23 IMAGING — CR DG CERVICAL SPINE FLEX&EXT ONLY
4 series · 4 of 4 positions shown · non-contrast
Comparison: 01/04/2015 cervical spine MRI.

CLINICAL DATA: History of cervical fusion. Breast cancer with
worsening neck pain, most prominent with extension.

EXAM:
CERVICAL SPINE - FLEXION AND EXTENSION VIEWS ONLY

[c-spine lat]
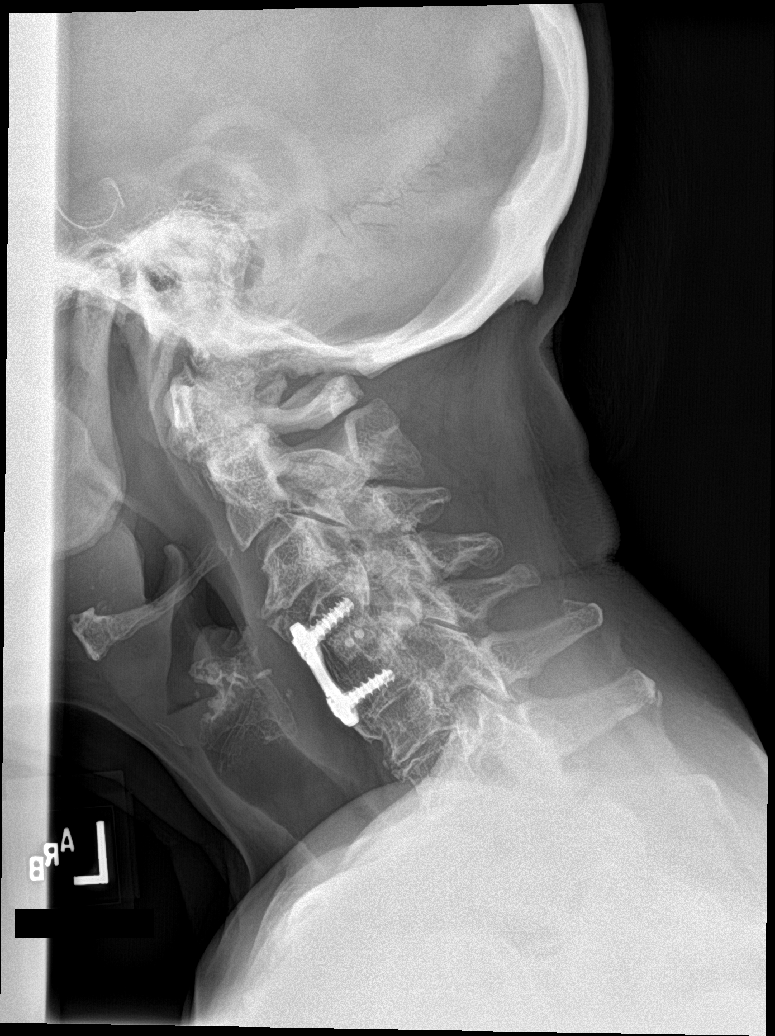

[c-spine flex]
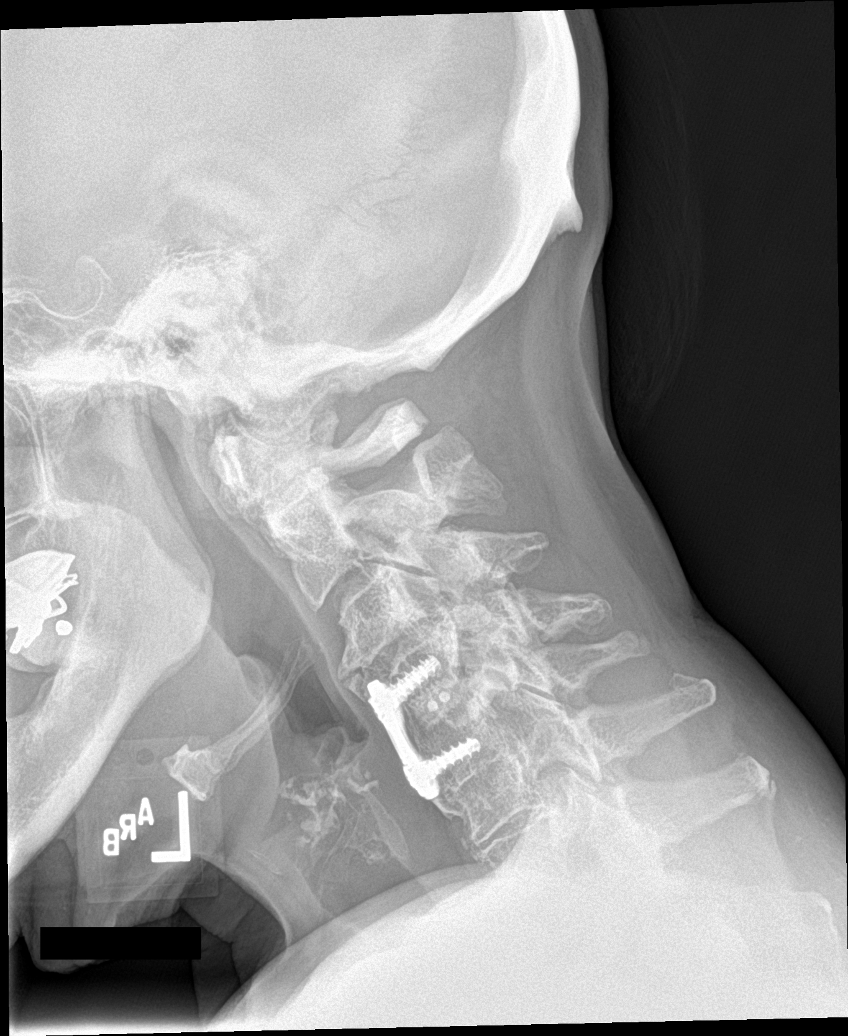

[c-spine ext]
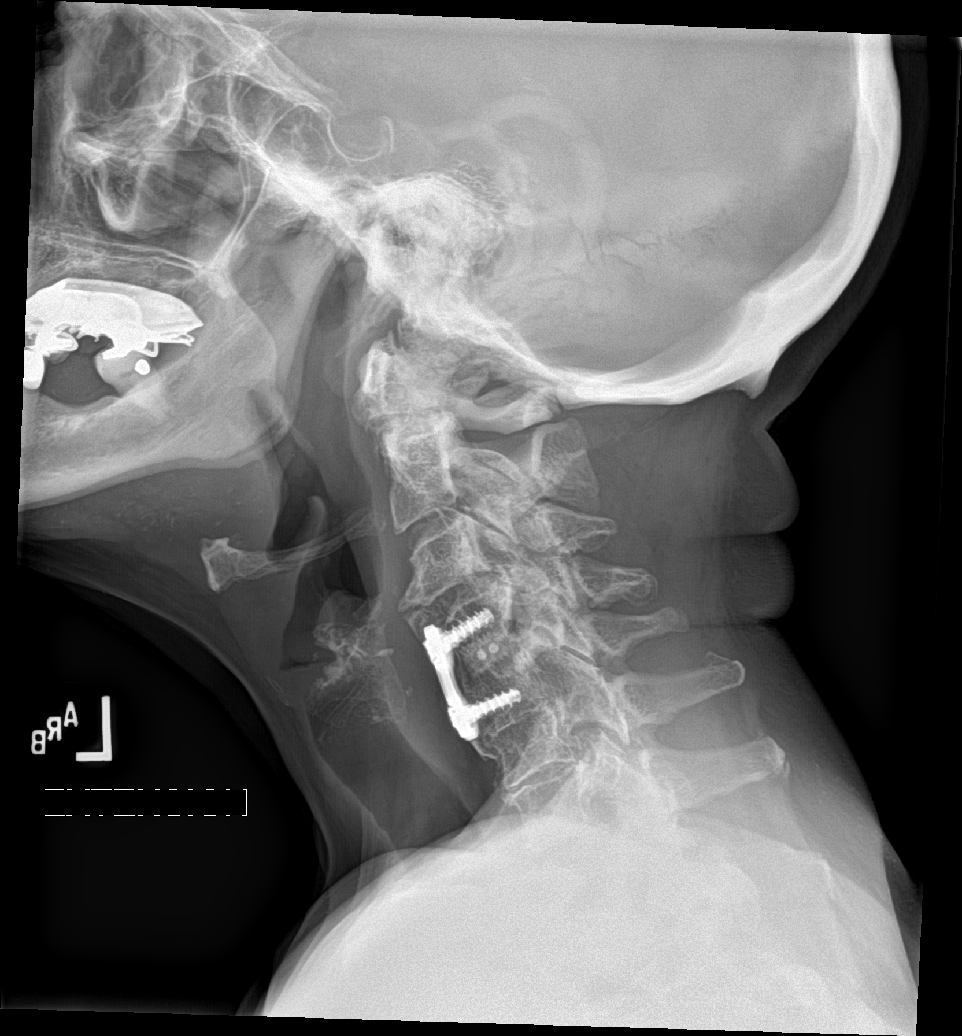

[c-spine swimmers]
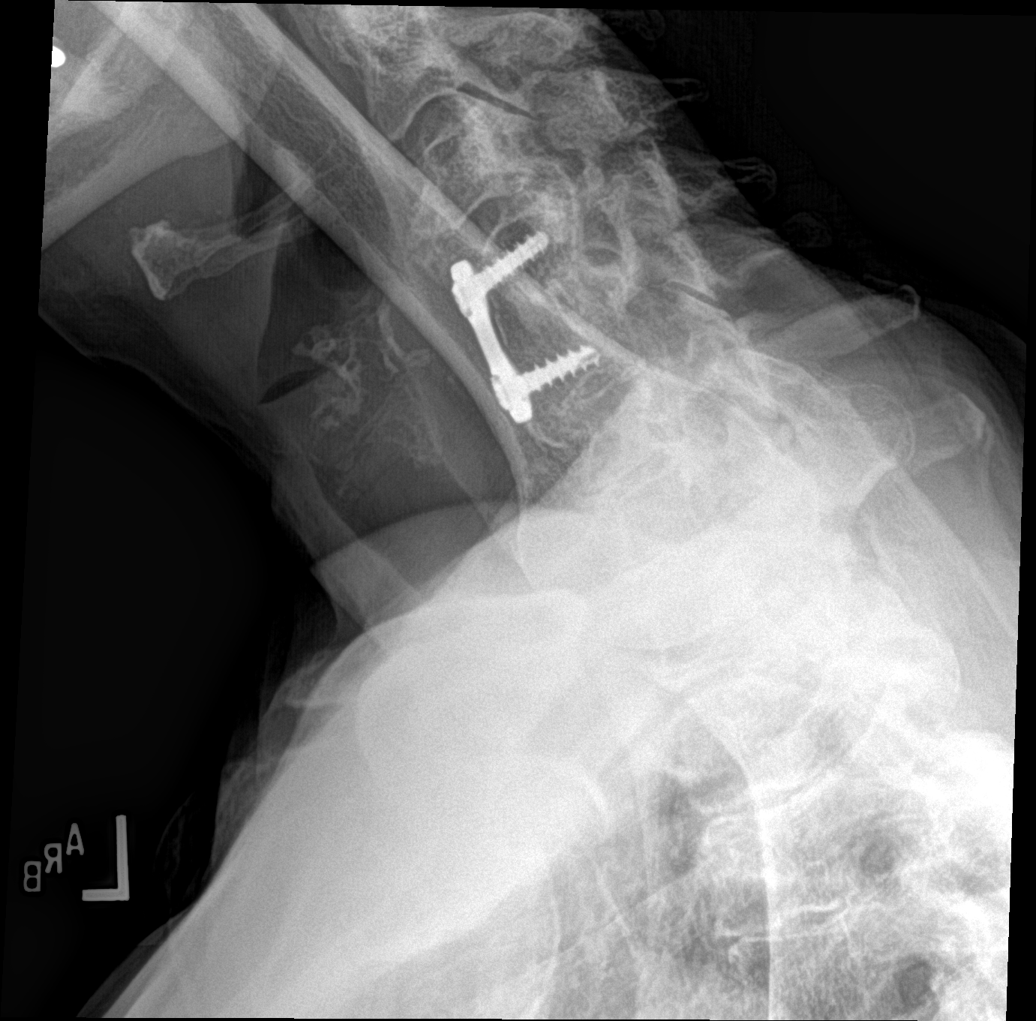

[4 of 4 positions shown; findings below may reference images not displayed]

FINDINGS: Anterior surgical plate with interlocking screws is noted at C4-5,
with bone cage at C4-5, with no evidence of hardware fracture or
loosening. There is straightening of the cervical spine no fracture
or suspicious focal osseous lesion. There is mild 4 mm
anterolisthesis at C2-3 with flexion, which measures 0 mm in neutral
and extension positions. No dynamic instability seen at the
atlantodental joint or at the other cervical levels. Moderate
degenerative disc disease is present throughout the mid to lower
cervical spine.
IMPRESSION: 1. Mild dynamic instability at C2-3 with flexion.
2. Status post ACDF at C4-5, with no evidence of hardware
complication.
3. Moderate degenerative disc disease throughout the mid to lower
cervical spine.

## 2017-11-01 IMAGING — CR DG CHEST 2V
1 series · 2 of 2 positions shown · non-contrast
Comparison: CT chest 11/10/2014 and earlier. Chest x-ray 10/11/2014
and earlier.

CLINICAL DATA: 74-year-old with personal history of left lower
outer quadrant breast cancer who developed severe radiation
pneumonitis in the lingula after adjuvant radiation therapy.
Currently patient complains of nonproductive cough and low-grade
fever

EXAM:
CHEST  2 VIEW

[Series 1: w chest pa · 0.14mm/px · 2 of 2 slices shown]
[im 1/2]
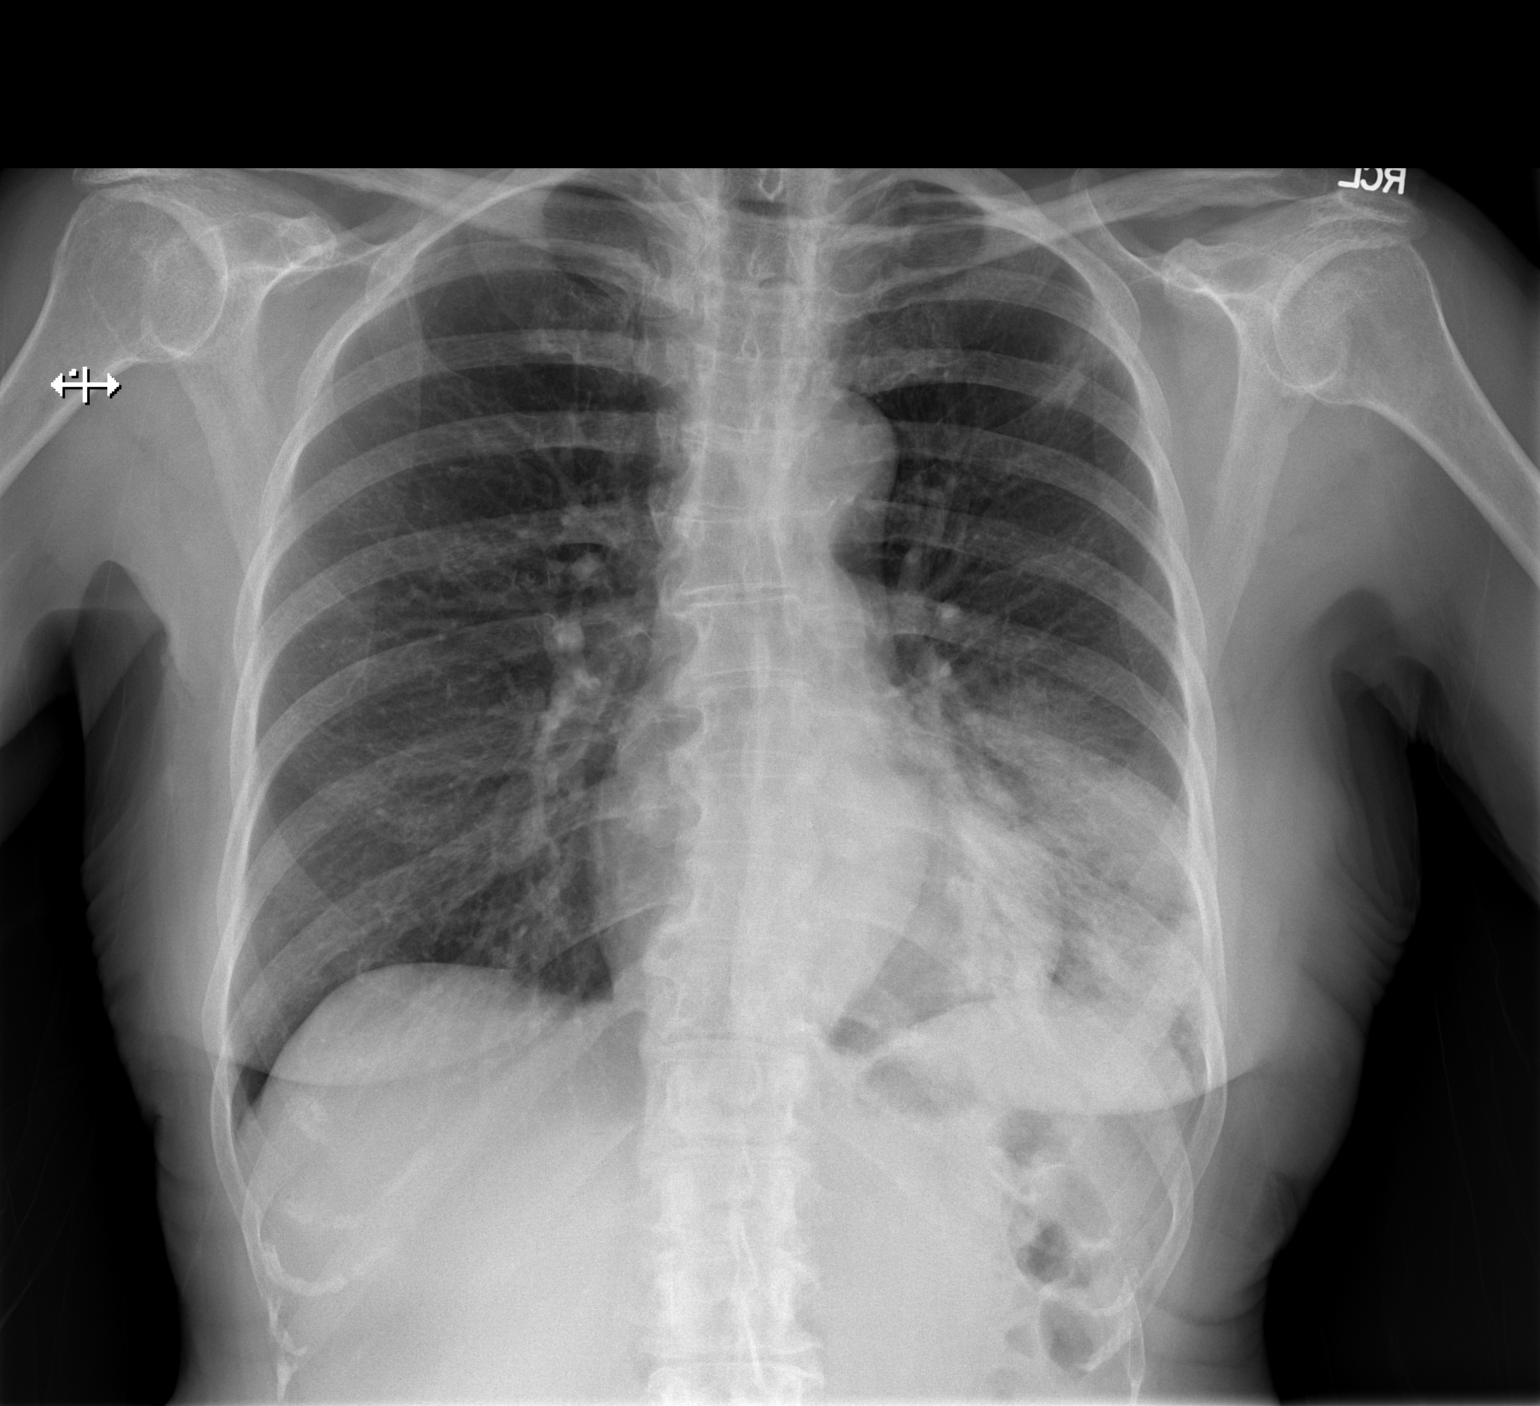
[im 2/2]
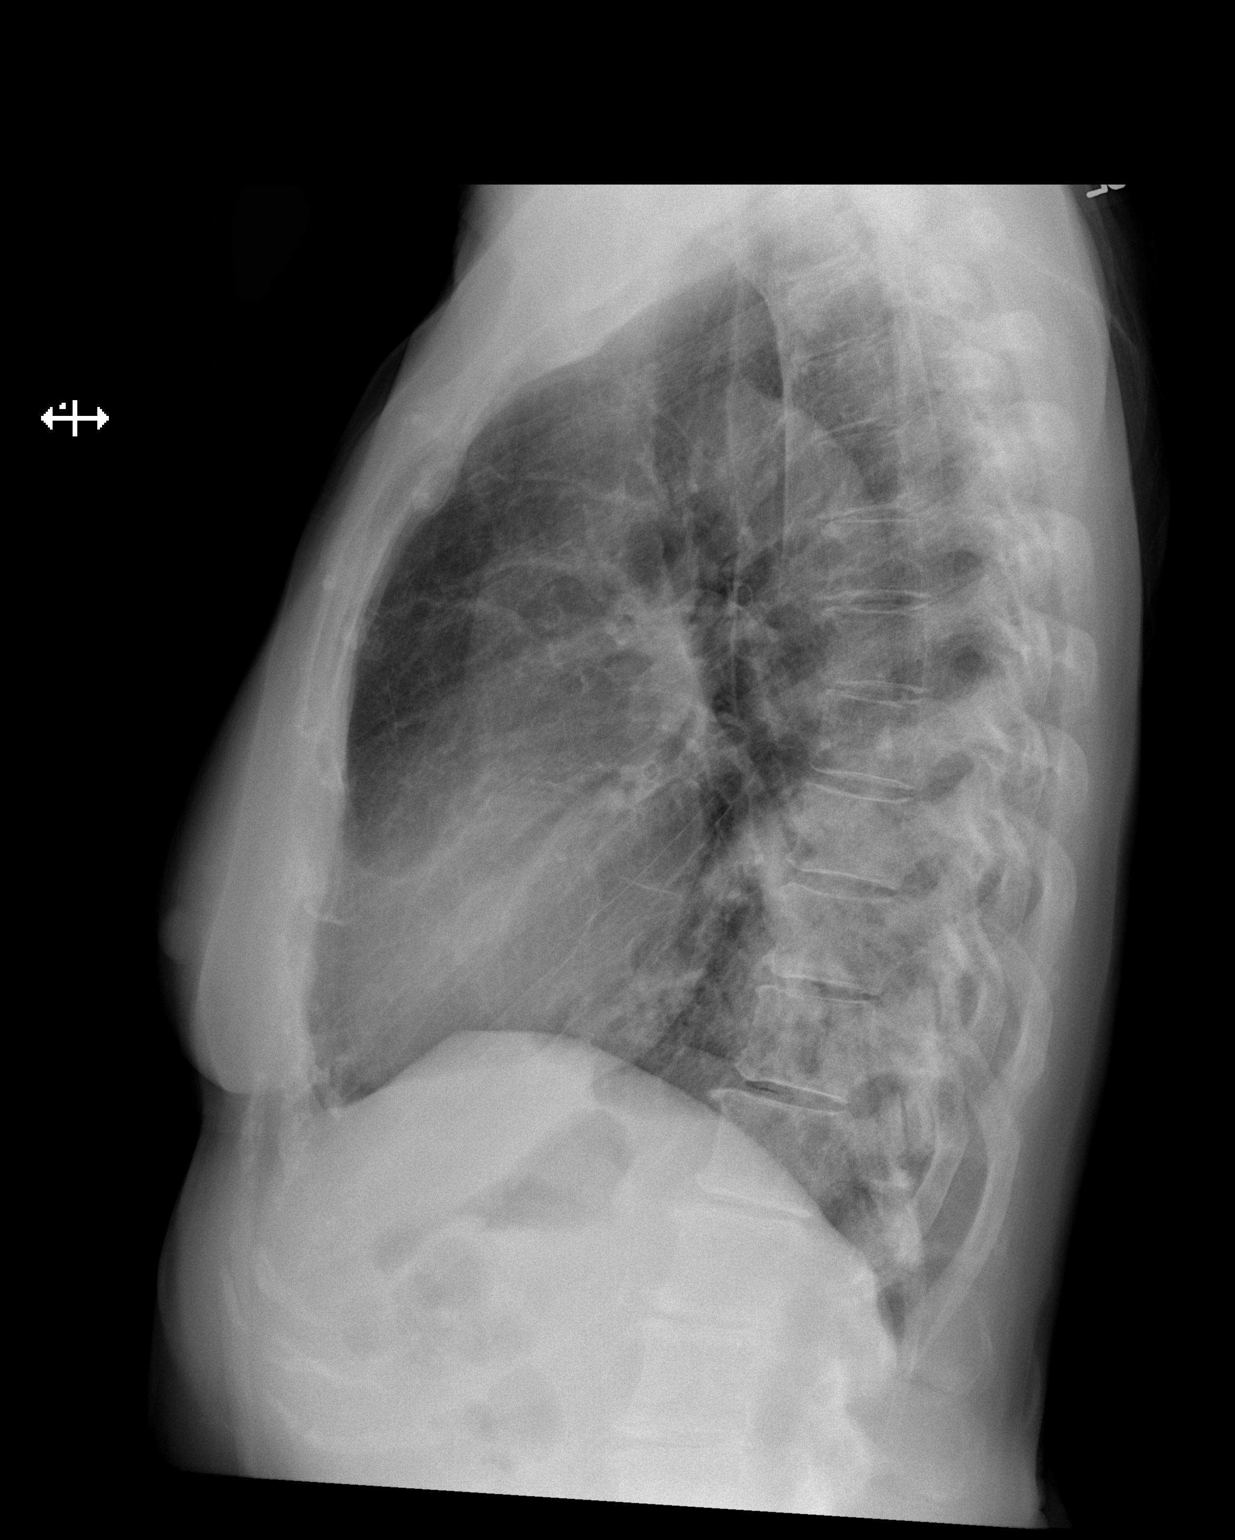

[2 of 2 positions shown; findings below may reference images not displayed]

FINDINGS: Cardiac silhouette upper normal in size, unchanged. Thoracic aorta
mildly tortuous and atherosclerotic, unchanged. Hilar and
mediastinal contours otherwise unremarkable. Interval development of
airspace consolidation with air bronchograms in the left lower lobe.
No evidence of residual or recurrent opacity in the lingula at the
site of prior radiation pneumonitis. New vague opacity peripherally
in the left upper lobe approaching the apex. Right lung remains
clear. No pleural effusions. Mild degenerative changes involving the
thoracic spine.
IMPRESSION: 1. Pneumonia involving the left lower lobe and minimally the left
upper lobe.
2. No evidence of residual or recurrent disease in the lingula at
the site of prior radiation pneumonitis.

## 2017-11-30 IMAGING — RF DG CERVICAL SPINE 1V
1 series · 1 of 1 positions shown · non-contrast
Comparison: MRI of the cervical spine on 01/04/2015

CLINICAL DATA: Anterior cervical fusion.

EXAM:
DG C-ARM 61-120 MIN; DG CERVICAL SPINE - 1 VIEW

[Series 1: run · 1 of 1 slices shown]
[im 1/1]
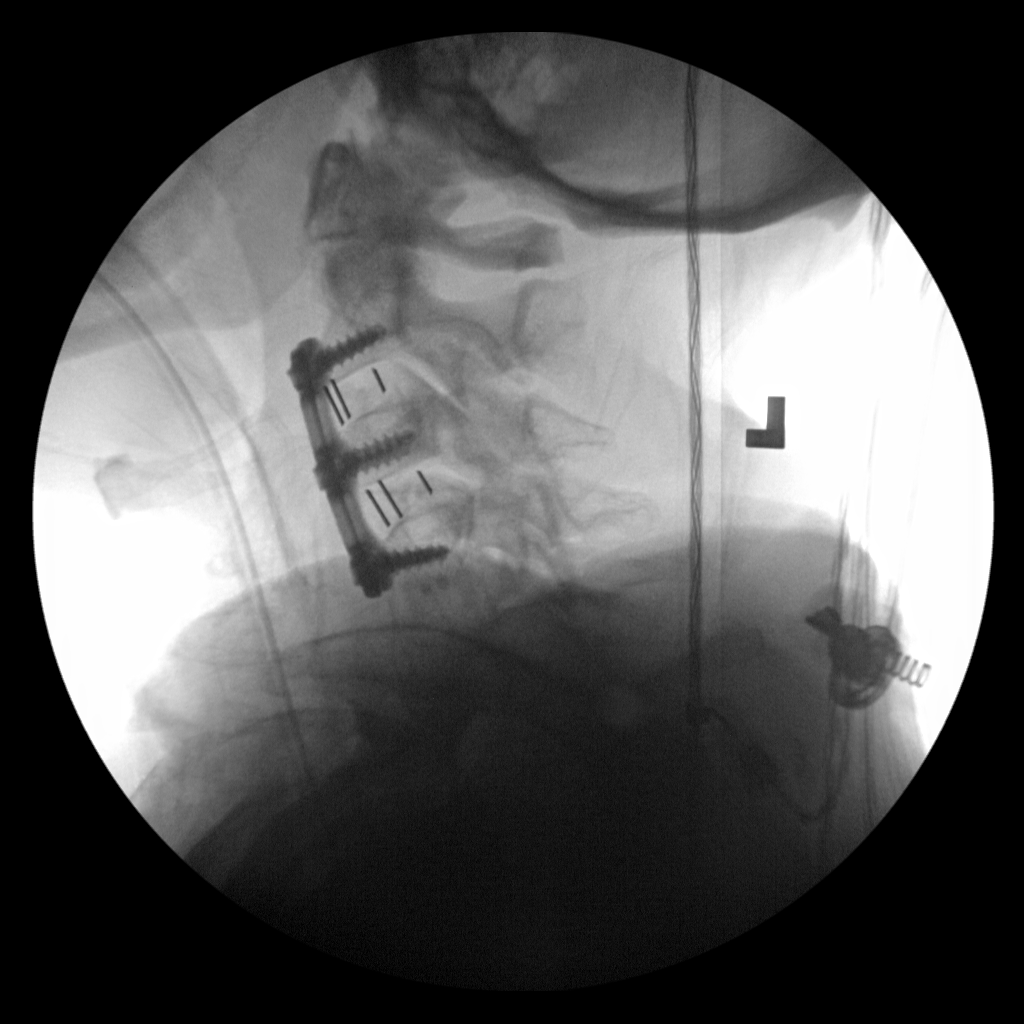

[1 of 1 positions shown; findings below may reference images not displayed]

FINDINGS: Intraoperative C-arm image in a lateral projection demonstrates
cervical fusion plate and screws spanning the C2-3 and C3-4 levels.
Alignment appears anatomic. Interbody plugs are also present.
IMPRESSION: Anatomic alignment in the lateral projection after anterior cervical
fusion across the C2-3 and C3-4 levels.

## 2018-01-26 IMAGING — CT CT CHEST W/O CM
2 of 3 series · 16 of 46 positions shown, 18 images · non-contrast
Comparison: Chest radiograph, 02/15/2015.  Chest CT, 11/10/2014.

CLINICAL DATA: Patient states she has Radiation Pneumonitis. Hx:
Left sided Breast CA, Partial Mastectomy. Treated with radiation.
Denies pain or SOB to chest. Denies other surgeries

EXAM:
CT CHEST WITHOUT CONTRAST
TECHNIQUE: Multidetector CT imaging of the chest was performed following the
standard protocol without IV contrast.

[Series 2: routine chest wo · axial · 0.70mm/px · z∈[-378,-113]mm · 13 of 61 slices shown, 15 images]
[im 4/61  soft-tissue]
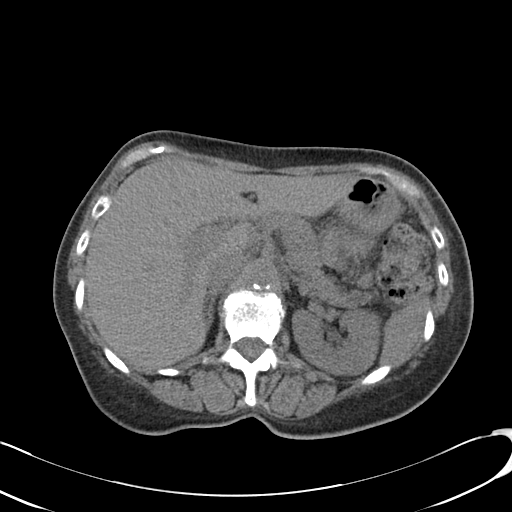
[im 4/61  bone]
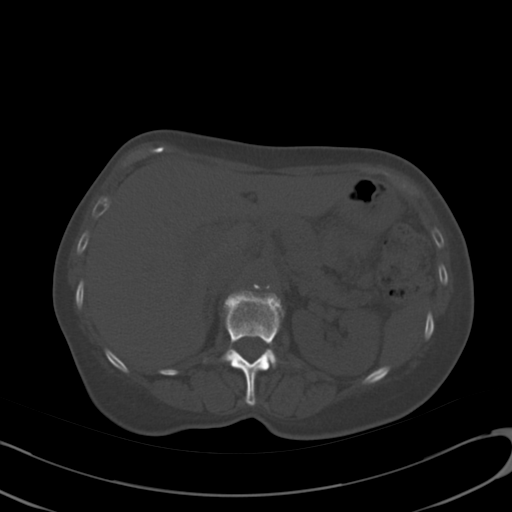
[im 8/61  soft-tissue]
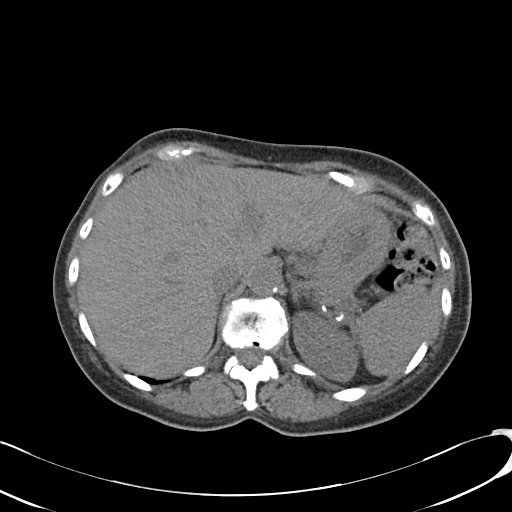
[im 12/61  soft-tissue]
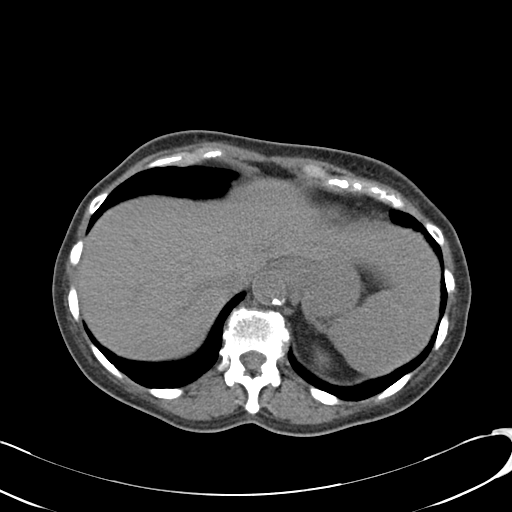
[im 18/61  soft-tissue]
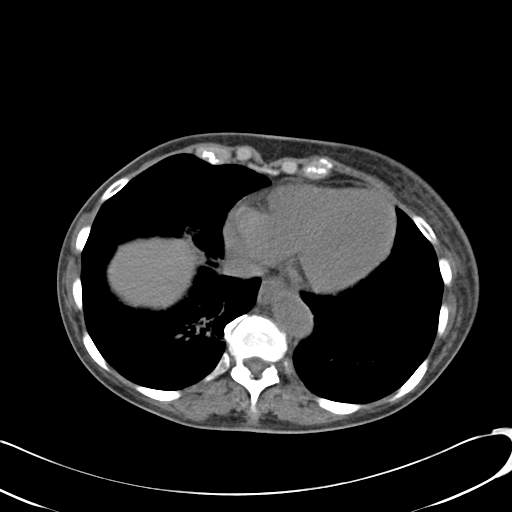
[im 22/61  soft-tissue]
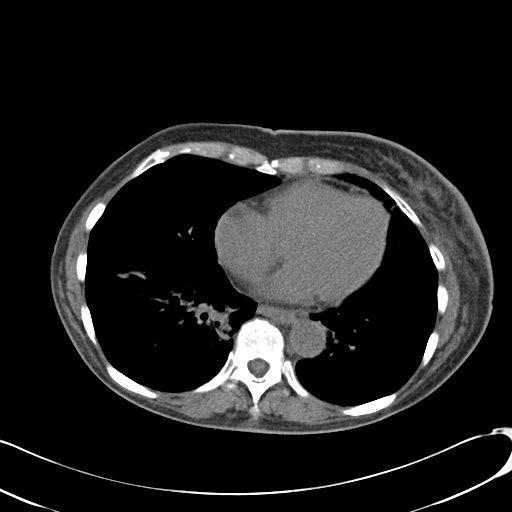
[im 26/61  soft-tissue]
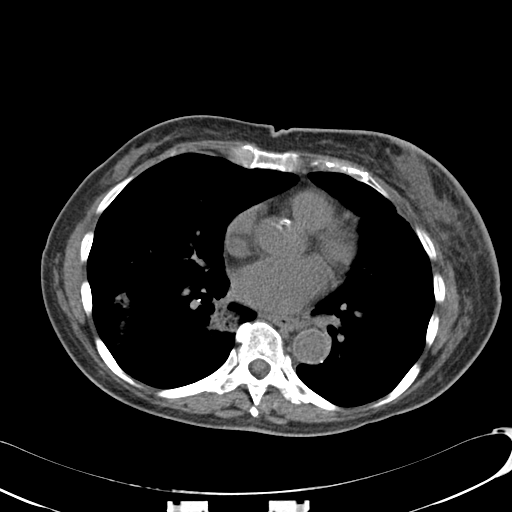
[im 31/61  soft-tissue]
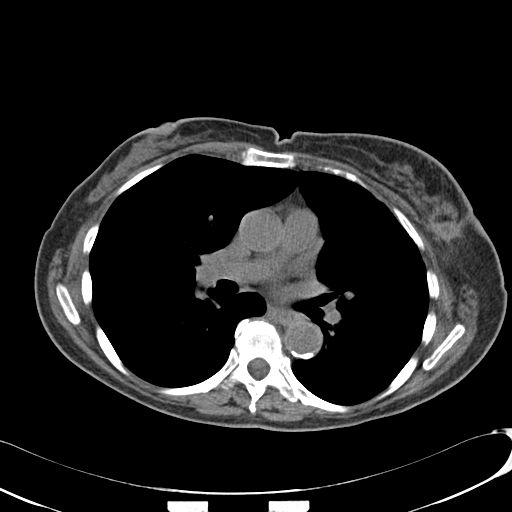
[im 35/61  soft-tissue]
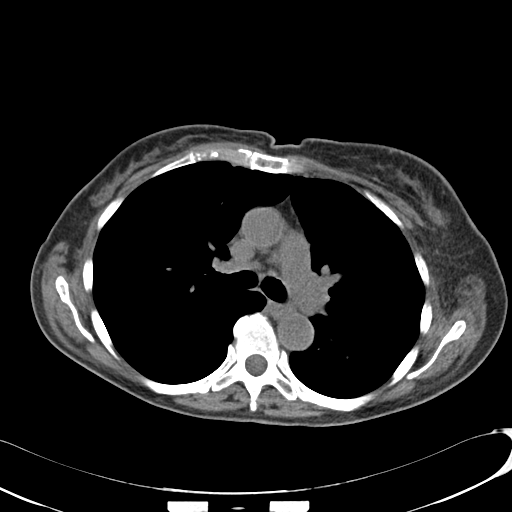
[im 39/61  soft-tissue]
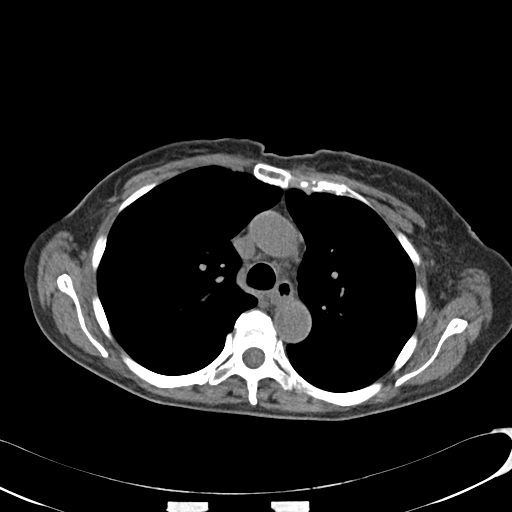
[im 39/61  bone]
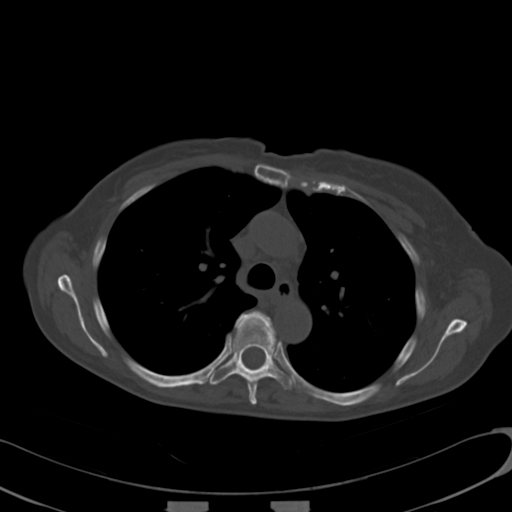
[im 43/61  soft-tissue]
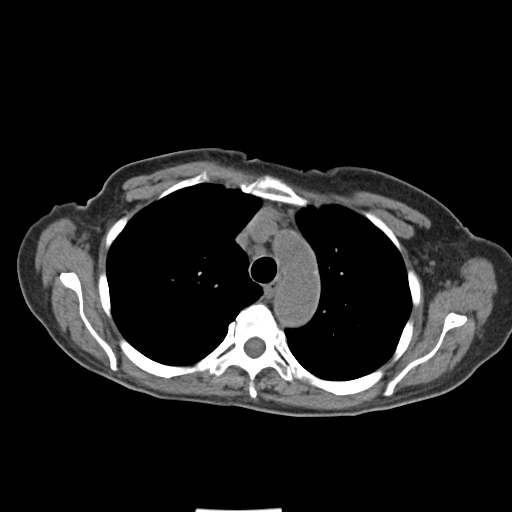
[im 49/61  soft-tissue]
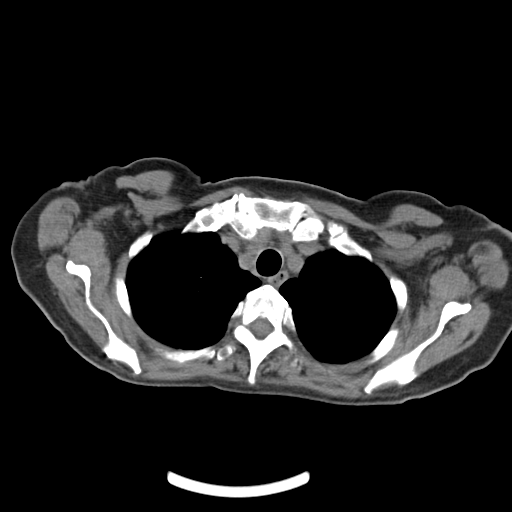
[im 53/61  soft-tissue]
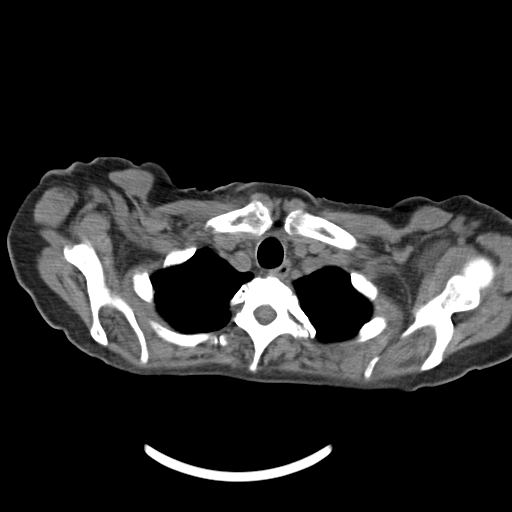
[im 57/61  soft-tissue]
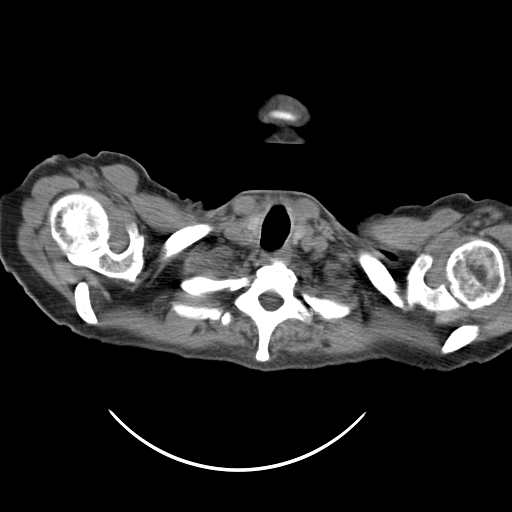

[Series 5: routine chest wo cor · coronal · 0.61mm/px · 3 of 98 slices shown]
[im 33/98  soft-tissue]
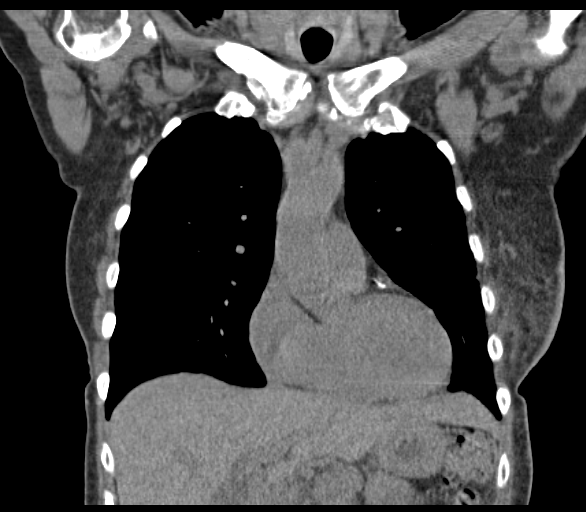
[im 44/98  soft-tissue]
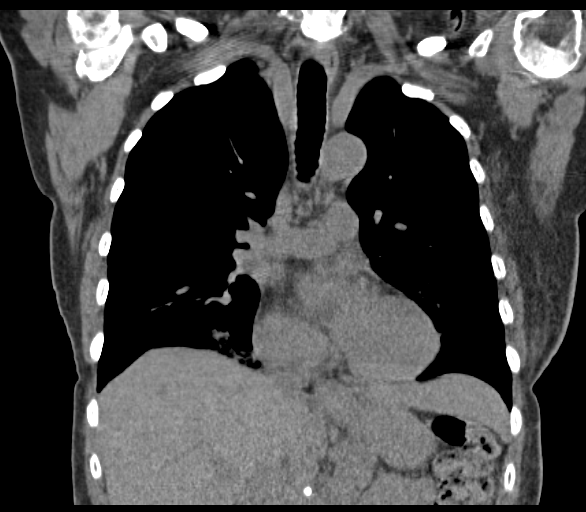
[im 54/98  soft-tissue]
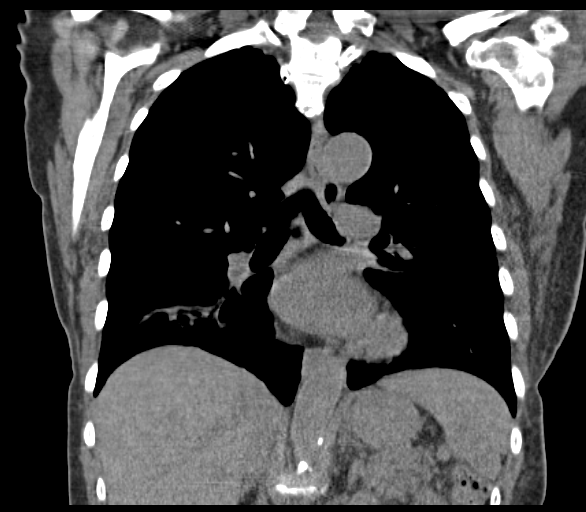

[16 of 46 positions shown; findings below may reference images not displayed]

FINDINGS: Neck base and axilla: No masses. No adenopathy. Fluid collection
lies along the anterior margin of the left shoulder, nonspecific.
This may be in subcoracoid bursa. The seroma in the left breast has
decreased in size from 2 cm to 1.5 cm. No new breast abnormalities.

Mediastinum and hila: Heart is normal in size and configuration.
Mild coronary artery calcifications. No mediastinal or hilar masses
or pathologically enlarged lymph nodes.

Lungs and pleura: Since prior study, since prior study, since prior
study, airspace consolidation has developed right lower lobe in a
peribronchovascular distribution. A smaller similar area seen in the
central right middle lobe. There is linear and subtle ground-glass
opacity in the left upper lobe lingula, similar to the prior exam.
Minimal linear and ground-glass opacities seen in central left lower
lobe. There is no pulmonary mass or suspicious nodule. No evidence
of pulmonary edema. No pleural effusion or pneumothorax.

Limited upper abdomen:  Unremarkable.

Musculoskeletal: Skeletal structures are demineralized. There are
degenerative changes throughout the visualized spine no osteoblastic
or osteolytic lesions.
IMPRESSION: 1. New airspace consolidation has developed since the prior chest
CT, primarily in the right lower lobe with a smaller area in the
right middle lobe. This is consistent with radiation pneumonitis. It
could be infectious pneumonia. Milder opacity in the left upper lobe
lingula and central aspect of the left lower lobe is similar to the
prior exam, with the stability suggesting chronic atelectasis or
scarring. Remainder of the lungs is clear.
2. No evidence of metastatic disease.

## 2018-04-24 ENCOUNTER — Encounter: Payer: Self-pay | Admitting: *Deleted

## 2018-05-04 IMAGING — MG MM DIGITAL DIAGNOSTIC BILAT W/ TOMO W/ CAD
9 of 14 series · 9 of 30 positions shown · non-contrast
Comparison: Previous exam(s).

CLINICAL DATA: History of left lumpectomy in 3393. The patient
received radiation. The patient reports significant weight loss in
the past year secondary to diet and lifestyle modification. The
patient has gone from a size 12 to a size [DATE].

EXAM:
2D DIGITAL DIAGNOSTIC BILATERAL MAMMOGRAM WITH CAD AND ADJUNCT TOMO

[L MLO (1 of 3)]
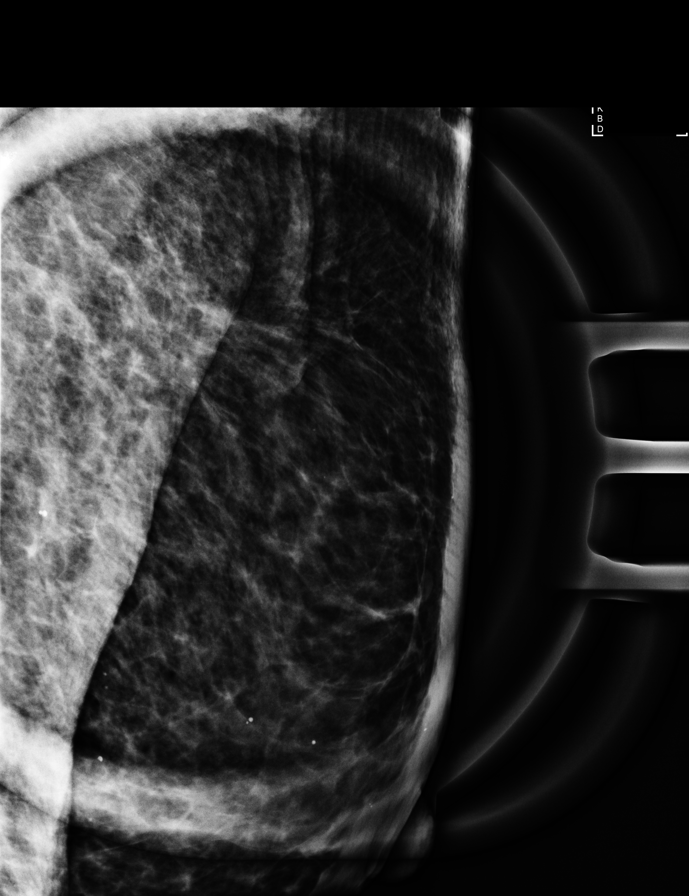

[L MLO (2 of 3)]
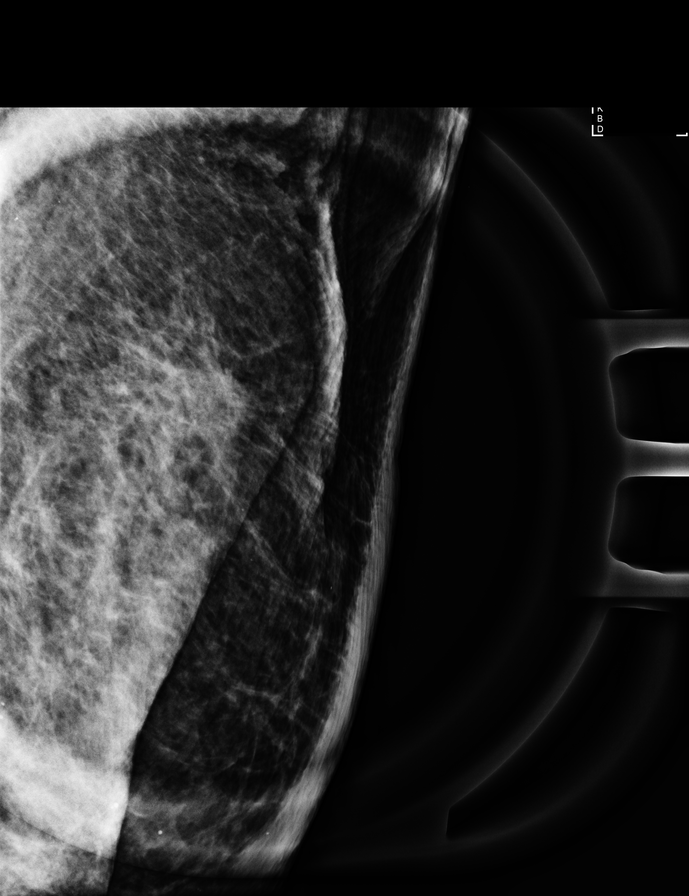

[R CC synth-2D]
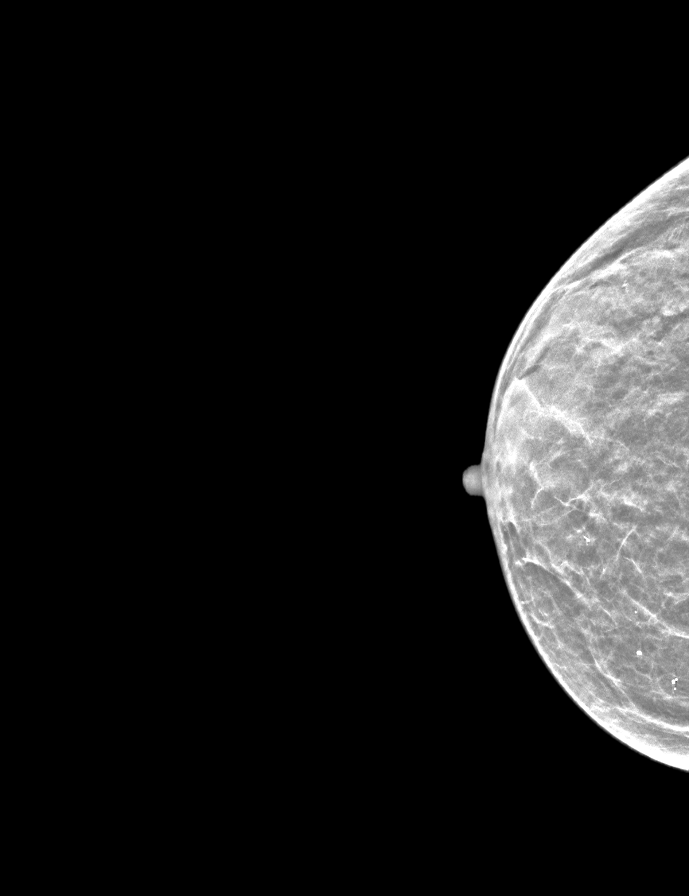

[L CC]
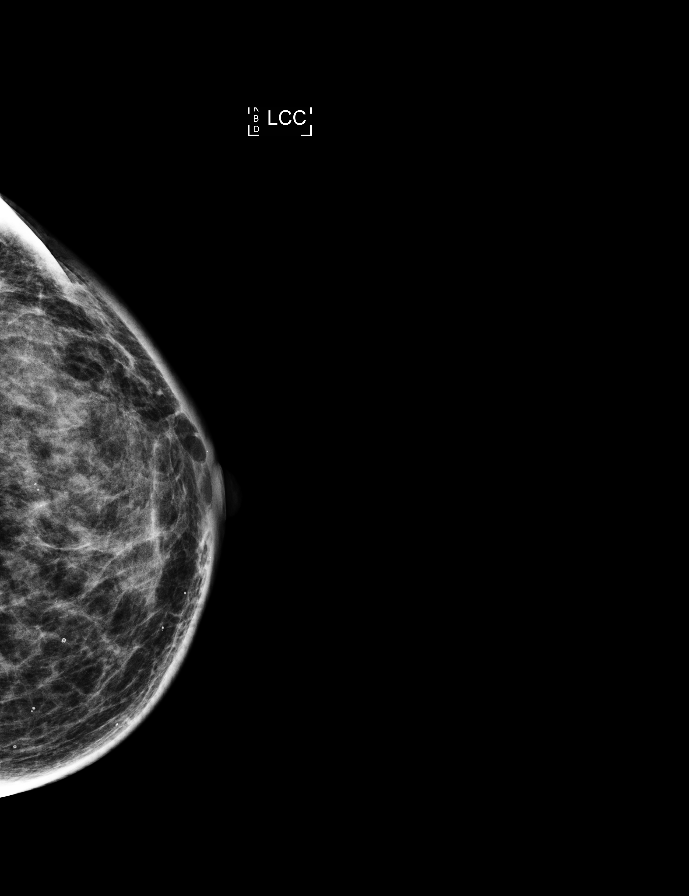

[L MLO (3 of 3)]
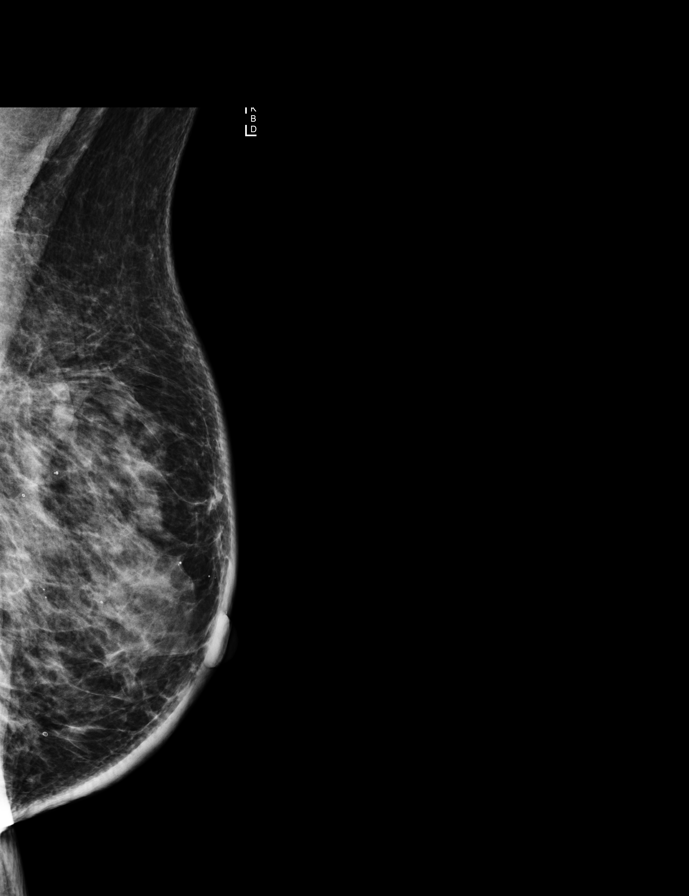

[R MLO synth-2D]
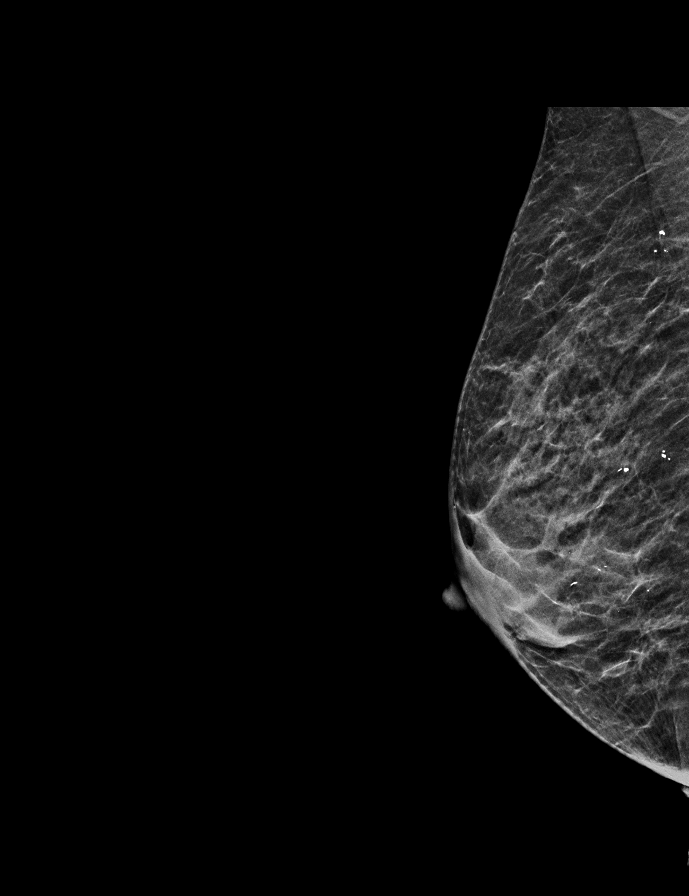

[R MLO]
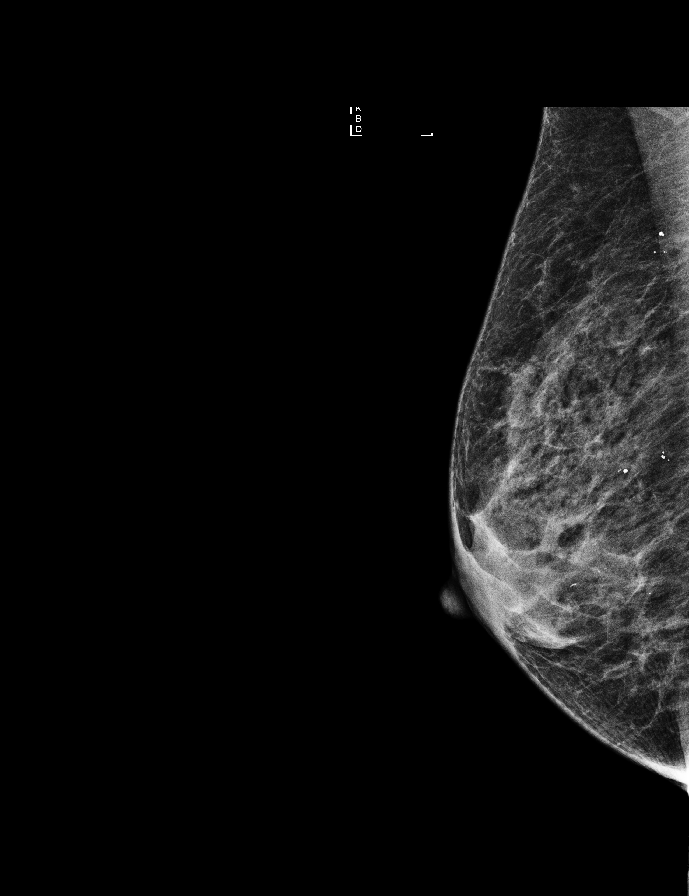

[L MLO synth-2D]
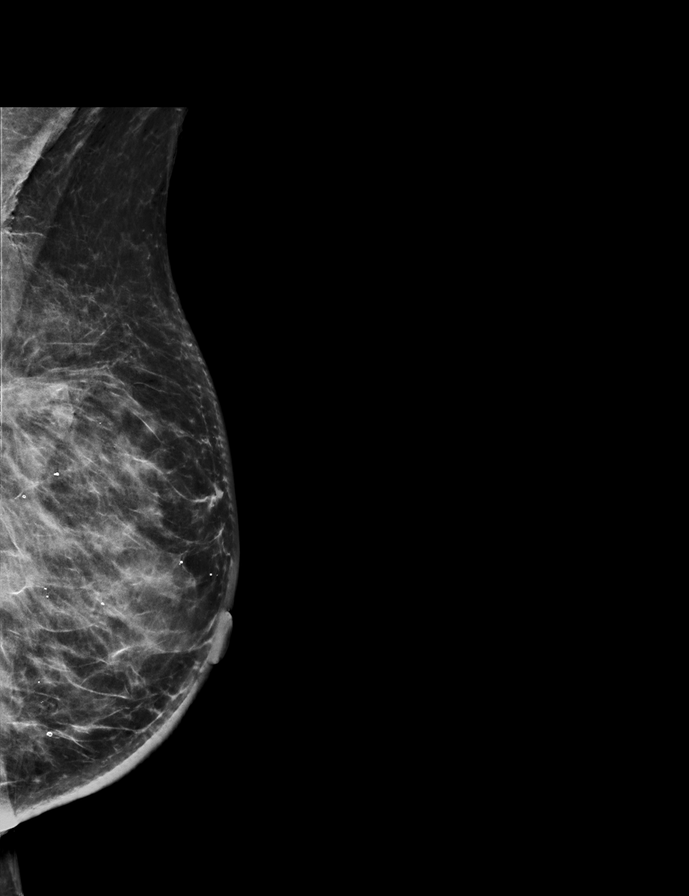

[L CC synth-2D]
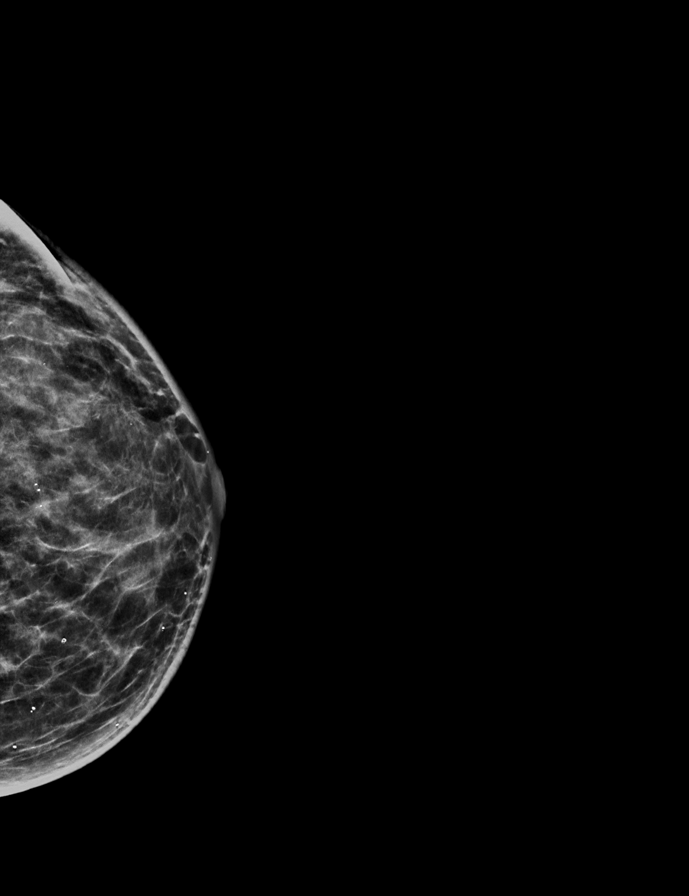

[9 of 30 positions shown; findings below may reference images not displayed]

ACR Breast Density Category d: The breast tissue is extremely dense,
which lowers the sensitivity of mammography.
FINDINGS: Postlumpectomy changes are again noted of the left breast. No
suspicious mass, calcifications, or other abnormality is identified
within either breast.

Mammographic images were processed with CAD.
IMPRESSION: No mammographic evidence of malignancy.

RECOMMENDATION:
Bilateral diagnostic mammogram in 1 year.

I have discussed the findings and recommendations with the patient.
Results were also provided in writing at the conclusion of the
visit. If applicable, a reminder letter will be sent to the patient
regarding the next appointment.

BI-RADS CATEGORY  2: Benign.

## 2018-05-13 ENCOUNTER — Encounter
Admission: RE | Disposition: A | Discharge status: Home / Self Care | Payer: Self-pay | Source: Home / Self Care | Attending: Ophthalmology

## 2018-05-13 ENCOUNTER — Ambulatory Visit: Payer: Medicare Other | Admitting: Anesthesiology

## 2018-05-13 ENCOUNTER — Ambulatory Visit
Admission: RE | Admit: 2018-05-13 | Discharge: 2018-05-13 | Disposition: A | Discharge status: Home / Self Care | Payer: Medicare Other | Attending: Ophthalmology | Admitting: Ophthalmology

## 2018-05-13 ENCOUNTER — Encounter: Payer: Self-pay | Admitting: *Deleted

## 2018-05-13 DIAGNOSIS — K219 Gastro-esophageal reflux disease without esophagitis: Secondary | ICD-10-CM | POA: Diagnosis not present

## 2018-05-13 DIAGNOSIS — Z79899 Other long term (current) drug therapy: Secondary | ICD-10-CM | POA: Diagnosis not present

## 2018-05-13 DIAGNOSIS — J42 Unspecified chronic bronchitis: Secondary | ICD-10-CM | POA: Insufficient documentation

## 2018-05-13 DIAGNOSIS — Z7984 Long term (current) use of oral hypoglycemic drugs: Secondary | ICD-10-CM | POA: Insufficient documentation

## 2018-05-13 DIAGNOSIS — J310 Chronic rhinitis: Secondary | ICD-10-CM | POA: Insufficient documentation

## 2018-05-13 DIAGNOSIS — H2512 Age-related nuclear cataract, left eye: Secondary | ICD-10-CM | POA: Diagnosis not present

## 2018-05-13 DIAGNOSIS — I10 Essential (primary) hypertension: Secondary | ICD-10-CM | POA: Diagnosis not present

## 2018-05-13 DIAGNOSIS — Z85828 Personal history of other malignant neoplasm of skin: Secondary | ICD-10-CM | POA: Diagnosis not present

## 2018-05-13 DIAGNOSIS — E7800 Pure hypercholesterolemia, unspecified: Secondary | ICD-10-CM | POA: Diagnosis not present

## 2018-05-13 DIAGNOSIS — Z853 Personal history of malignant neoplasm of breast: Secondary | ICD-10-CM | POA: Insufficient documentation

## 2018-05-13 DIAGNOSIS — E1136 Type 2 diabetes mellitus with diabetic cataract: Secondary | ICD-10-CM | POA: Insufficient documentation

## 2018-05-13 HISTORY — PX: CATARACT EXTRACTION W/PHACO: SHX586

## 2018-05-13 HISTORY — DX: Cough: R05

## 2018-05-13 HISTORY — DX: Gastro-esophageal reflux disease without esophagitis: K21.9

## 2018-05-13 HISTORY — DX: Cough, unspecified: R05.9

## 2018-05-13 LAB — GLUCOSE, CAPILLARY: Glucose-Capillary: 89 mg/dL (ref 70–99)

## 2018-05-13 SURGERY — PHACOEMULSIFICATION, CATARACT, WITH IOL INSERTION
Anesthesia: Monitor Anesthesia Care | Site: Eye | Laterality: Left

## 2018-05-13 MED ORDER — MOXIFLOXACIN HCL 0.5 % OP SOLN
OPHTHALMIC | Status: AC
  Filled 2018-05-13: qty 3

## 2018-05-13 MED ORDER — POVIDONE-IODINE 5 % OP SOLN
OPHTHALMIC | Status: DC | PRN
  Administered 2018-05-13: 1 via OPHTHALMIC

## 2018-05-13 MED ORDER — MIDAZOLAM HCL 2 MG/2ML IJ SOLN
INTRAMUSCULAR | Status: AC
  Filled 2018-05-13: qty 2

## 2018-05-13 MED ORDER — EPINEPHRINE PF 1 MG/ML IJ SOLN
INTRAOCULAR | Status: DC | PRN
  Administered 2018-05-13: 1 mL via OPHTHALMIC

## 2018-05-13 MED ORDER — POVIDONE-IODINE 5 % OP SOLN
OPHTHALMIC | Status: AC
  Filled 2018-05-13: qty 30

## 2018-05-13 MED ORDER — CARBACHOL 0.01 % IO SOLN
INTRAOCULAR | Status: DC | PRN
  Administered 2018-05-13: .5 mL via INTRAOCULAR

## 2018-05-13 MED ORDER — FENTANYL CITRATE (PF) 100 MCG/2ML IJ SOLN
INTRAMUSCULAR | Status: DC | PRN
  Administered 2018-05-13: 50 ug via INTRAVENOUS
  Administered 2018-05-13: 25 ug via INTRAVENOUS

## 2018-05-13 MED ORDER — MIDAZOLAM HCL 2 MG/2ML IJ SOLN
INTRAMUSCULAR | Status: DC | PRN
  Administered 2018-05-13: 1 mg via INTRAVENOUS

## 2018-05-13 MED ORDER — MOXIFLOXACIN HCL 0.5 % OP SOLN
OPHTHALMIC | Status: DC | PRN
  Administered 2018-05-13: .2 mL via OPHTHALMIC

## 2018-05-13 MED ORDER — ARMC OPHTHALMIC DILATING DROPS
OPHTHALMIC | Status: AC
  Administered 2018-05-13: 1 via OPHTHALMIC
  Filled 2018-05-13: qty 0.5

## 2018-05-13 MED ORDER — LIDOCAINE HCL (PF) 4 % IJ SOLN
INTRAOCULAR | Status: DC | PRN
  Administered 2018-05-13: 2 mL via OPHTHALMIC

## 2018-05-13 MED ORDER — EPINEPHRINE PF 1 MG/ML IJ SOLN
INTRAMUSCULAR | Status: AC
  Filled 2018-05-13: qty 1

## 2018-05-13 MED ORDER — ARMC OPHTHALMIC DILATING DROPS
1.0000 "application " | OPHTHALMIC | Status: AC
  Administered 2018-05-13 (×3): 1 via OPHTHALMIC

## 2018-05-13 MED ORDER — NA CHONDROIT SULF-NA HYALURON 40-17 MG/ML IO SOLN
INTRAOCULAR | Status: DC | PRN
  Administered 2018-05-13: 1 mL via INTRAOCULAR

## 2018-05-13 MED ORDER — ONDANSETRON HCL 4 MG/2ML IJ SOLN: 4.0000 mg | Freq: Once | INTRAMUSCULAR | Status: DC | PRN

## 2018-05-13 MED ORDER — SODIUM CHLORIDE 0.9 % IV SOLN
INTRAVENOUS | Status: DC
  Administered 2018-05-13: 10:00:00 via INTRAVENOUS

## 2018-05-13 MED ORDER — FENTANYL CITRATE (PF) 100 MCG/2ML IJ SOLN
INTRAMUSCULAR | Status: AC
  Filled 2018-05-13: qty 2

## 2018-05-13 MED ORDER — LIDOCAINE HCL (PF) 4 % IJ SOLN
INTRAMUSCULAR | Status: AC
  Filled 2018-05-13: qty 5

## 2018-05-13 MED ORDER — TETRACAINE HCL 0.5 % OP SOLN
OPHTHALMIC | Status: AC
  Administered 2018-05-13: 1 [drp] via OPHTHALMIC
  Filled 2018-05-13: qty 4

## 2018-05-13 MED ORDER — FENTANYL CITRATE (PF) 100 MCG/2ML IJ SOLN: 25.0000 ug | INTRAMUSCULAR | Status: DC | PRN

## 2018-05-13 MED ORDER — MOXIFLOXACIN HCL 0.5 % OP SOLN: 1.0000 [drp] | OPHTHALMIC | Status: DC | PRN

## 2018-05-13 MED ORDER — TETRACAINE HCL 0.5 % OP SOLN
1.0000 [drp] | OPHTHALMIC | Status: AC | PRN
  Administered 2018-05-13 (×3): 1 [drp] via OPHTHALMIC

## 2018-05-13 MED ORDER — NA CHONDROIT SULF-NA HYALURON 40-17 MG/ML IO SOLN
INTRAOCULAR | Status: AC
  Filled 2018-05-13: qty 1

## 2018-05-13 SURGICAL SUPPLY — 16 items
GLOVE BIO SURGEON STRL SZ8 (GLOVE) ×2 IMPLANT
GLOVE BIOGEL M 6.5 STRL (GLOVE) ×2 IMPLANT
GLOVE SURG LX 8.0 MICRO (GLOVE) ×1
GLOVE SURG LX STRL 8.0 MICRO (GLOVE) ×1 IMPLANT
GOWN STRL REUS W/ TWL LRG LVL3 (GOWN DISPOSABLE) ×2 IMPLANT
GOWN STRL REUS W/TWL LRG LVL3 (GOWN DISPOSABLE) ×4
LABEL CATARACT MEDS ST (LABEL) ×2 IMPLANT
LENS IOL TECNIS ITEC 25.0 (Intraocular Lens) ×1 IMPLANT
PACK CATARACT (MISCELLANEOUS) ×2 IMPLANT
PACK CATARACT BRASINGTON LX (MISCELLANEOUS) ×2 IMPLANT
PACK EYE AFTER SURG (MISCELLANEOUS) ×2 IMPLANT
SOL BSS BAG (MISCELLANEOUS) ×2
SOLUTION BSS BAG (MISCELLANEOUS) ×1 IMPLANT
SYR 5ML LL (SYRINGE) ×2 IMPLANT
WATER STERILE IRR 250ML POUR (IV SOLUTION) ×2 IMPLANT
WIPE NON LINTING 3.25X3.25 (MISCELLANEOUS) ×2 IMPLANT

## 2018-05-13 NOTE — Discharge Instructions (Addendum)
Eye Surgery Discharge Instructions  Expect mild scratchy sensation or mild soreness. DO NOT RUB YOUR EYE!  The day of surgery:  Minimal physical activity, but bed rest is not required  No reading, computer work, or close hand work  No bending, lifting, or straining.  May watch TV  For 24 hours:  No driving, legal decisions, or alcoholic beverages  Safety precautions  Eat anything you prefer: It is better to start with liquids, then soup then solid foods.  Solar shield eyeglasses should be worn for comfort in the sunlight/patch while sleeping  Resume all regular medications including aspirin or Coumadin if these were discontinued prior to surgery. You may shower, bathe, shave, or wash your hair. Tylenol may be taken for mild discomfort. Follow Dr. Inda Coke eye drop instruction sheet as reviewed.  Call your doctor if you experience significant pain, nausea, or vomiting, fever > 101 or other signs of infection. [phone] or [phone] Specific instructions:  Follow-up Information    Birder Robson, MD Follow up.   Specialty:  Ophthalmology Why:  05/14/18 @ 9:00 am Contact information: [place] [address] [phone]

## 2018-05-13 NOTE — Anesthesia Preprocedure Evaluation (Addendum)
Anesthesia Evaluation  Patient identified by MRN, date of birth, ID band Patient awake    Reviewed: Allergy & Precautions, NPO status , Patient's Chart, lab work & pertinent test results  History of Anesthesia Complications (+) PONV and history of anesthetic complications  Airway Mallampati: II  TM Distance: >3 FB Neck ROM: Full    Dental  (+) Dental Advisory Given, Partial Upper   Pulmonary pneumonia, unresolved, Recent URI ,  03/15/15 note from Pulmonology: Pre-op Pulmonary clearance.  --Given that she shows no signs of respiratory decompensation, I think she would be at moderate risk of pulmonary complications with surgery and could proceed with this from respiratory standpoint.   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 Metastatic disease.  --Will need a CT chest and possible bronchoscopy. This could be performed after her surgery.   Chronic bronchitis.  --Will start Anoro inhaler, given sample today.   Chronic rhinitis.  --Continue sinus rinse, zyrtec, and nasal spray per ENT recs.   EXAM: CHEST 2 VIEW  COMPARISON: CT chest 11/10/2014 and earlier. Chest x-ray 10/11/2014 and earlier.  FINDINGS: Cardiac silhouette upper normal in size, unchanged. Thoracic aorta mildly tortuous and atherosclerotic, unchanged. Hilar and mediastinal contours otherwise unremarkable. Interval development of airspace consolidation with air bronchograms in the left lower lobe. No evidence of residual or recurrent opacity in the lingula at the site of prior radiation pneumonitis. New vague opacity peripherally in the left upper lobe approaching the apex. Right lung remains clear. No pleural effusions. Mild degenerative changes involving the thoracic spine.  IMPRESSION: 1. Pneumonia involving the left lower lobe and minimally the left upper lobe. 2. No evidence of residual or  recurrent disease in the lingula at the site of prior radiation pneumonitis.   Electronically Signed  By: Evangeline Dakin M.D.  On: 02/15/2015 14:03   Pulmonary exam normal breath sounds clear to auscultation       Cardiovascular hypertension, (-) angina(-) CAD and (-) Past MI Normal cardiovascular exam Rhythm:Regular Rate:Normal     Neuro/Psych  Neuromuscular disease negative psych ROS   GI/Hepatic Neg liver ROS, GERD  Medicated,  Endo/Other  diabetes, Type 2, Oral Hypoglycemic Agents  Renal/GU negative Renal ROS  negative genitourinary   Musculoskeletal  (+) Arthritis , Rheumatoid disorders,    Abdominal   Peds negative pediatric ROS (+)  Hematology  (+) Blood dyscrasia, anemia ,   Anesthesia Other Findings Day of surgery medications reviewed with the patient.  Breast cancer  Reproductive/Obstetrics                             Anesthesia Physical  Anesthesia Plan  ASA: III  Anesthesia Plan: MAC   Post-op Pain Management:    Induction: Intravenous  PONV Risk Score and Plan:   Airway Management Planned: Nasal Cannula  Additional Equipment:   Intra-op Plan:   Post-operative Plan:   Informed Consent: I have reviewed the patients History and Physical, chart, labs and discussed the procedure including the risks, benefits and alternatives for the proposed anesthesia with the patient or authorized representative who has indicated his/her understanding and acceptance.     Dental advisory given  Plan Discussed with: CRNA  Anesthesia Plan Comments: (Risks/benefits of general anesthesia discussed with patient including risk of damage to teeth, lips, gum, and tongue, nausea/vomiting, allergic reactions to medications, and the possibility of heart attack, stroke and death.  All patient questions answered.  Patient  wishes to proceed.)        Anesthesia Quick Evaluation

## 2018-05-13 NOTE — Anesthesia Postprocedure Evaluation (Signed)
Anesthesia Post Note  Patient: [place]  Procedure(s) Performed: CATARACT EXTRACTION PHACO AND INTRAOCULAR LENS PLACEMENT (IOC) LEFT (Left Eye)  Patient location during evaluation: Phase II Anesthesia Type: MAC Level of consciousness: awake, awake and alert and oriented Pain management: satisfactory to patient Vital Signs Assessment: post-procedure vital signs reviewed and stable Respiratory status: spontaneous breathing Cardiovascular status: blood pressure returned to baseline Anesthetic complications: no     Last Vitals:  Vitals:   05/13/18 0925 05/13/18 1028  BP: (!) 143/82 136/70  Pulse: 82 70  Resp: 18 16  Temp: 37.1 C   SpO2: 96% 99%    Last Pain:  Vitals:   05/13/18 1028  TempSrc: Oral  PainSc: 0-No pain                 Philbert Riser

## 2018-05-13 NOTE — Op Note (Signed)
PREOPERATIVE DIAGNOSIS:  Nuclear sclerotic cataract of the left eye.   POSTOPERATIVE DIAGNOSIS:  Nuclear sclerotic cataract of the left eye.   OPERATIVE PROCEDURE: Procedure(s): CATARACT EXTRACTION PHACO AND INTRAOCULAR LENS PLACEMENT (IOC) LEFT   SURGEON:  Birder Robson, MD.   ANESTHESIA:  Anesthesiologist: Alvin Critchley, MD CRNA: Philbert Riser, CRNA  1.      Managed anesthesia care. 2.     0.76ml of Shugarcaine was instilled following the paracentesis   COMPLICATIONS:  None.   TECHNIQUE:   Stop and chop   DESCRIPTION OF PROCEDURE:  The patient was examined and consented in the preoperative holding area where the aforementioned topical anesthesia was applied to the left eye and then brought back to the Operating Room where the left eye was prepped and draped in the usual sterile ophthalmic fashion and a lid speculum was placed. A paracentesis was created with the side port blade and the anterior chamber was filled with viscoelastic. A near clear corneal incision was performed with the steel keratome. A continuous curvilinear capsulorrhexis was performed with a cystotome followed by the capsulorrhexis forceps. Hydrodissection and hydrodelineation were carried out with BSS on a blunt cannula. The lens was removed in a stop and chop  technique and the remaining cortical material was removed with the irrigation-aspiration handpiece. The capsular bag was inflated with viscoelastic and the Technis [id number] lens was placed in the capsular bag without complication. The remaining viscoelastic was removed from the eye with the irrigation-aspiration handpiece. The wounds were hydrated. The anterior chamber was flushed with Miostat and the eye was inflated to physiologic pressure. 0.11ml Vigamox was placed in the anterior chamber. The wounds were found to be water tight. The eye was dressed with Vigamox. The patient was given protective glasses to wear throughout the day and a shield with which to sleep  tonight. The patient was also given drops with which to begin a drop regimen today and will follow-up with me in one day. Implant Name Type Inv. Item Serial No. Manufacturer Lot No. LRB No. Used  LENS IOL DIOP 25.0 - [id number] Intraocular Lens LENS IOL DIOP 25.0 [id number] AMO  Left 1    Procedure(s) with comments: CATARACT EXTRACTION PHACO AND INTRAOCULAR LENS PLACEMENT (IOC) LEFT (Left) - [place]  00:32 CDE 3.76 Fluid pack lot # [id number]  Electronically signed: Birder Robson 05/13/2018 10:27 AM

## 2018-05-13 NOTE — H&P (Signed)
All labs reviewed. Abnormal studies sent to patients PCP when indicated.  Previous H&P reviewed, patient examined, there are NO CHANGES.  Ashley Bray Porfilio2/25/202010:01 AM

## 2018-05-13 NOTE — Transfer of Care (Signed)
Immediate Anesthesia Transfer of Care Note  Patient: Ashley Bray  Procedure(s) Performed: CATARACT EXTRACTION PHACO AND INTRAOCULAR LENS PLACEMENT (IOC) LEFT (Left Eye)  Patient Location: PACU  Anesthesia Type:MAC  Level of Consciousness: awake, alert  and oriented  Airway & Oxygen Therapy: Patient Spontanous Breathing  Post-op Assessment: Report given to RN and Post -op Vital signs reviewed and stable  Post vital signs: Reviewed and stable  Last Vitals:  Vitals Value Taken Time  BP    Temp    Pulse    Resp    SpO2      Last Pain:  Vitals:   05/13/18 0925  TempSrc: Oral  PainSc: 0-No pain         Complications: No apparent anesthesia complications

## 2018-05-13 NOTE — OR Nursing (Signed)
Iv start not documented in epic.  D/c'd to right wrist #22g in postop.  Gauze,paper tape applied.

## 2018-05-13 NOTE — Anesthesia Post-op Follow-up Note (Signed)
Anesthesia QCDR form completed.        

## 2018-06-03 ENCOUNTER — Ambulatory Visit: Payer: Medicare Other | Admitting: Oncology

## 2018-06-03 ENCOUNTER — Inpatient Hospital Stay: Payer: Medicare Other | Admitting: Oncology

## 2018-06-05 ENCOUNTER — Other Ambulatory Visit
Admission: RE | Admit: 2018-06-05 | Discharge: 2018-06-05 | Disposition: A | Discharge status: Home / Self Care | Payer: Medicare Other | Source: Ambulatory Visit | Attending: Cardiology | Admitting: Cardiology

## 2018-06-05 DIAGNOSIS — K529 Noninfective gastroenteritis and colitis, unspecified: Secondary | ICD-10-CM | POA: Insufficient documentation

## 2018-06-05 LAB — GASTROINTESTINAL PANEL BY PCR, STOOL (REPLACES STOOL CULTURE)

## 2018-06-05 LAB — C DIFFICILE QUICK SCREEN W PCR REFLEX
C Diff antigen: NEGATIVE
C Diff interpretation: NOT DETECTED
C Diff toxin: NEGATIVE

## 2018-06-09 LAB — CALPROTECTIN, FECAL: Calprotectin, Fecal: 49 ug/g (ref 0–120)

## 2018-06-09 LAB — PANCREATIC ELASTASE, FECAL: Pancreatic Elastase-1, Stool: 350 ug Elast./g (ref >200)

## 2018-06-10 ENCOUNTER — Encounter: Admission: RE | Payer: Self-pay | Source: Home / Self Care

## 2018-06-10 ENCOUNTER — Ambulatory Visit: Admission: RE | Admit: 2018-06-10 | Payer: Medicare Other | Source: Home / Self Care | Admitting: Ophthalmology

## 2018-06-10 SURGERY — PHACOEMULSIFICATION, CATARACT, WITH IOL INSERTION
Anesthesia: Choice | Laterality: Right

## 2018-06-29 IMAGING — CR DG CHEST 2V
2 series · 2 of 2 positions shown · non-contrast
Comparison: CT 07/12/2015.

CLINICAL DATA: Radiation pneumonitis. Congestion. History of breast
cancer .

EXAM:
CHEST  2 VIEW

[chest pa]
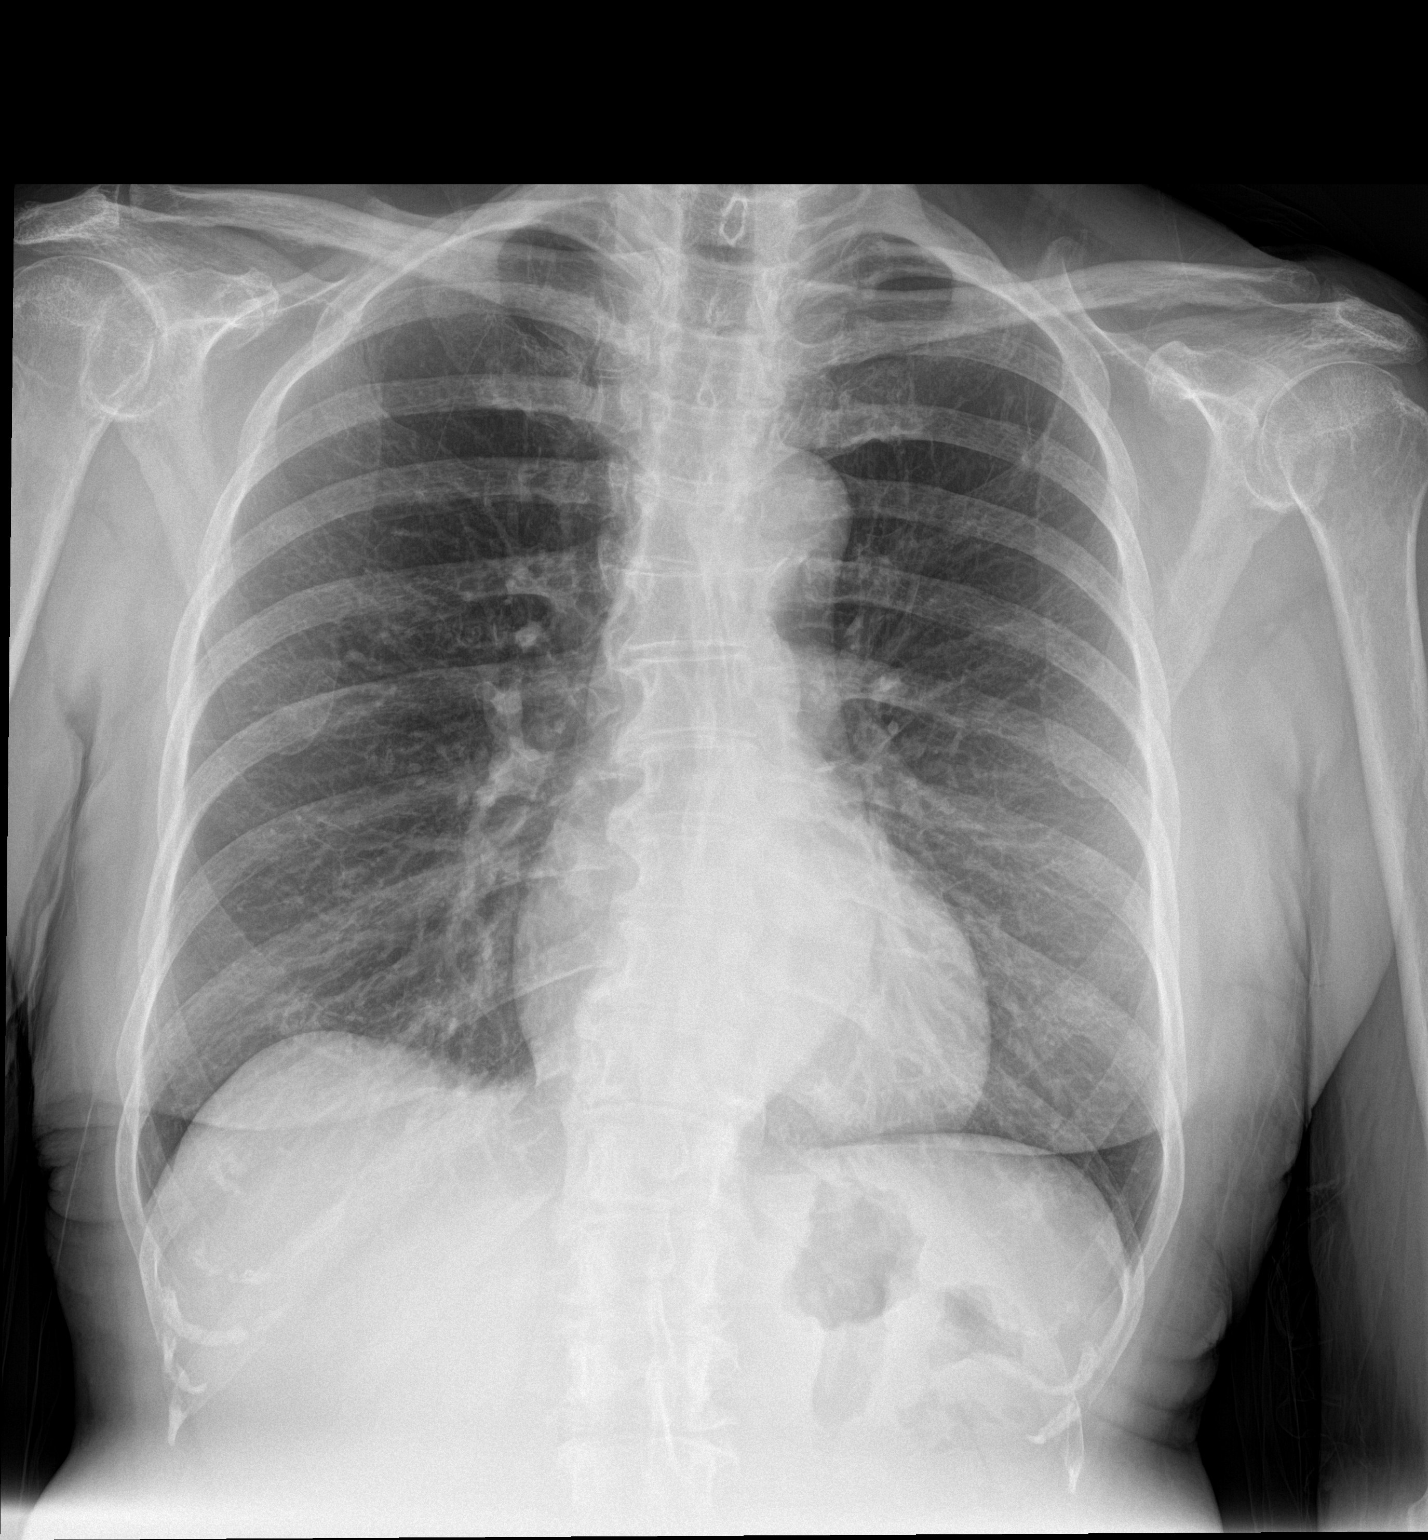

[chest lat]
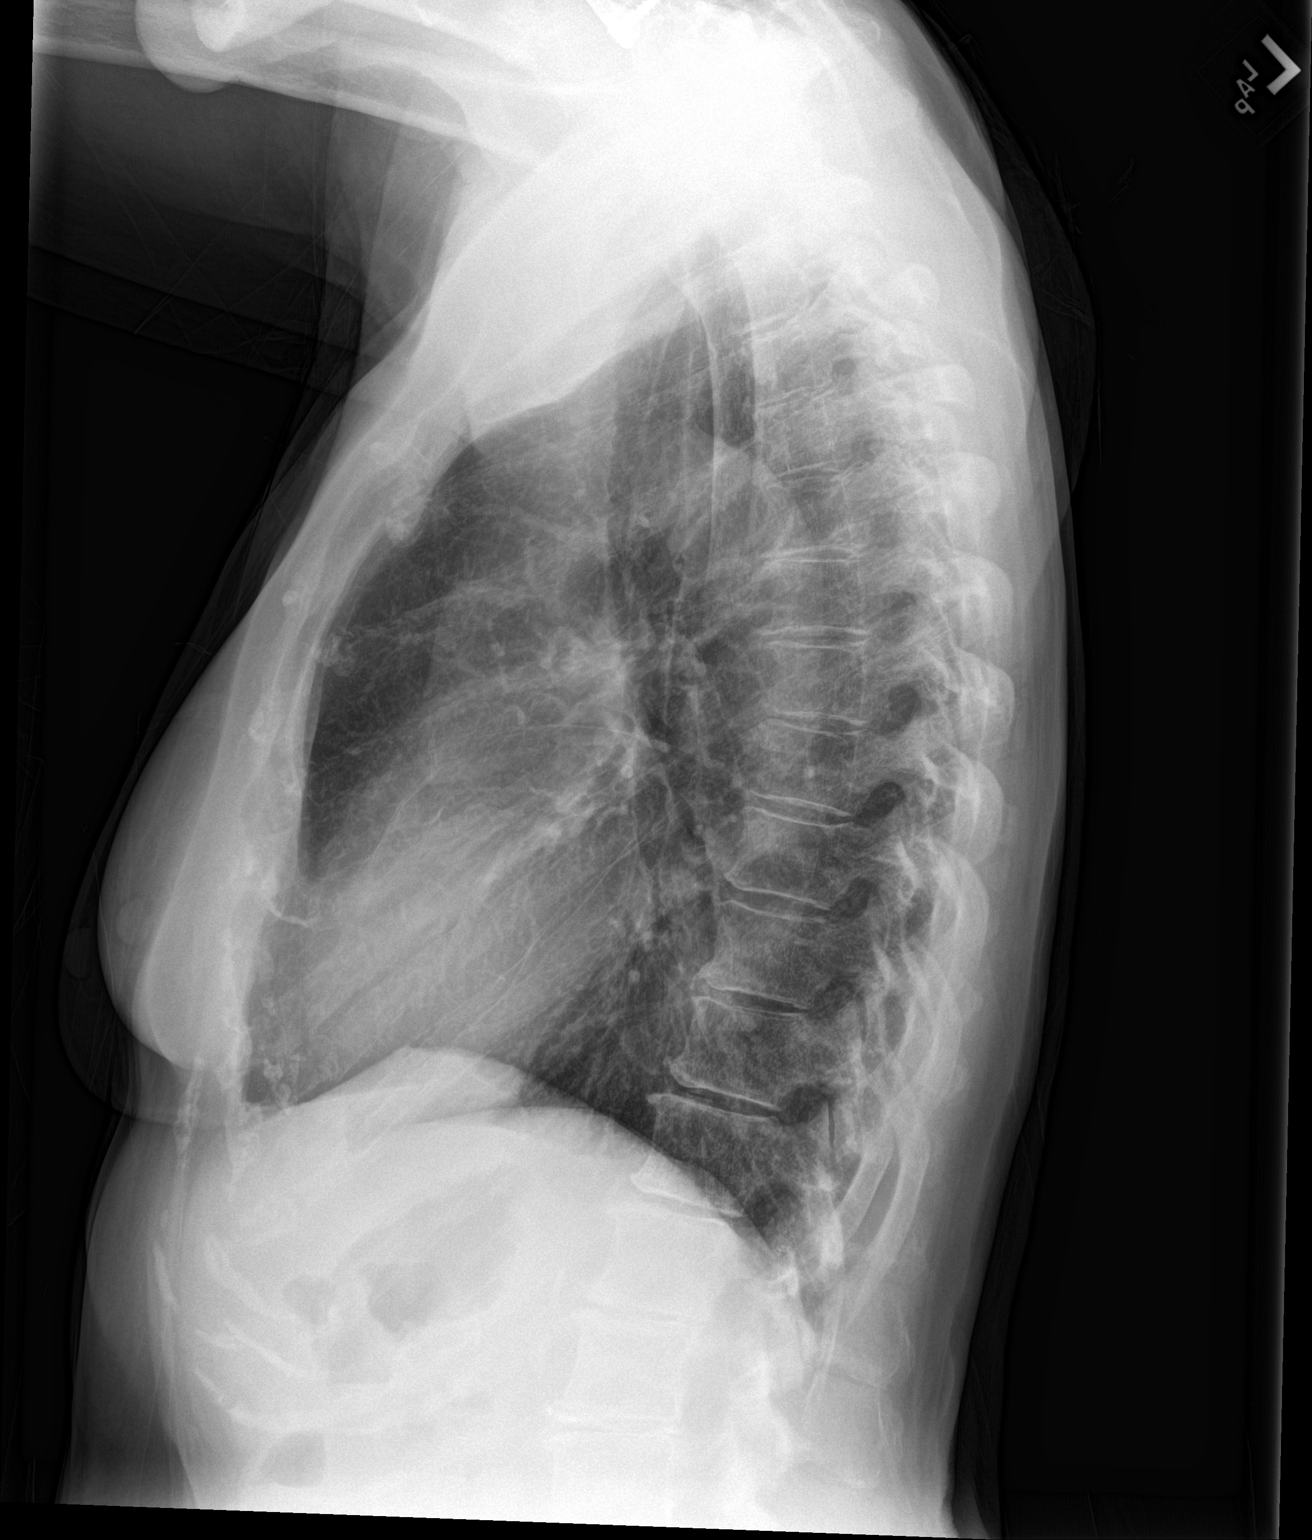

[2 of 2 positions shown; findings below may reference images not displayed]

FINDINGS: Mediastinum and hilar structures normal. Lungs are clear. Heart size
normal. No pleural effusion or pneumothorax. Cervical spine fusion.
IMPRESSION: No acute cardiopulmonary disease. No focal abnormality identified
noted on today's exam. Specifically no evidence of focal pulmonary
infiltrate.

## 2018-07-16 ENCOUNTER — Encounter: Payer: Self-pay | Admitting: *Deleted

## 2018-07-30 ENCOUNTER — Encounter: Payer: Self-pay | Admitting: *Deleted

## 2018-07-30 ENCOUNTER — Other Ambulatory Visit: Payer: Self-pay

## 2018-08-01 ENCOUNTER — Other Ambulatory Visit
Admission: RE | Admit: 2018-08-01 | Discharge: 2018-08-01 | Disposition: A | Discharge status: Home / Self Care | Payer: Medicare Other | Source: Ambulatory Visit | Attending: Ophthalmology | Admitting: Ophthalmology

## 2018-08-01 ENCOUNTER — Other Ambulatory Visit: Payer: Self-pay

## 2018-08-01 DIAGNOSIS — Z1159 Encounter for screening for other viral diseases: Secondary | ICD-10-CM | POA: Insufficient documentation

## 2018-08-01 NOTE — Discharge Instructions (Signed)

## 2018-08-03 LAB — NOVEL CORONAVIRUS, NAA (HOSP ORDER, SEND-OUT TO REF LAB; TAT 18-24 HRS): SARS-CoV-2, NAA: NOT DETECTED

## 2018-08-04 NOTE — Anesthesia Preprocedure Evaluation (Addendum)
Anesthesia Evaluation  Patient identified by MRN, date of birth, ID band Patient awake    Reviewed: Allergy & Precautions, NPO status , Patient's Chart, lab work & pertinent test results  History of Anesthesia Complications (+) PONV and history of anesthetic complications  Airway Mallampati: II   Neck ROM: Full    Dental  (+) Partial Upper,    Pulmonary neg pulmonary ROS,    Pulmonary exam normal breath sounds clear to auscultation       Cardiovascular hypertension, Normal cardiovascular exam Rhythm:Regular Rate:Normal     Neuro/Psych  Neuromuscular disease (neuropathy) negative neurological ROS     GI/Hepatic GERD  ,  Endo/Other  diabetes, Type 2  Renal/GU negative Renal ROS     Musculoskeletal  (+) Arthritis ,   Abdominal   Peds  Hematology  (+) Blood dyscrasia, anemia , Breast CA   Anesthesia Other Findings   Reproductive/Obstetrics                            Anesthesia Physical Anesthesia Plan  ASA: III  Anesthesia Plan: MAC   Post-op Pain Management:    Induction: Intravenous  PONV Risk Score and Plan: 2 and TIVA and Midazolam  Airway Management Planned: Natural Airway  Additional Equipment:   Intra-op Plan:   Post-operative Plan:   Informed Consent: I have reviewed the patients History and Physical, chart, labs and discussed the procedure including the risks, benefits and alternatives for the proposed anesthesia with the patient or authorized representative who has indicated his/her understanding and acceptance.       Plan Discussed with: CRNA  Anesthesia Plan Comments:        Anesthesia Quick Evaluation

## 2018-08-05 ENCOUNTER — Ambulatory Visit: Payer: Medicare Other | Admitting: Anesthesiology

## 2018-08-05 ENCOUNTER — Ambulatory Visit
Admission: RE | Admit: 2018-08-05 | Discharge: 2018-08-05 | Disposition: A | Discharge status: Home / Self Care | Payer: Medicare Other | Attending: Ophthalmology | Admitting: Ophthalmology

## 2018-08-05 ENCOUNTER — Encounter
Admission: RE | Disposition: A | Discharge status: Home / Self Care | Payer: Self-pay | Source: Home / Self Care | Attending: Ophthalmology

## 2018-08-05 DIAGNOSIS — E1136 Type 2 diabetes mellitus with diabetic cataract: Secondary | ICD-10-CM | POA: Insufficient documentation

## 2018-08-05 DIAGNOSIS — Z7984 Long term (current) use of oral hypoglycemic drugs: Secondary | ICD-10-CM | POA: Diagnosis not present

## 2018-08-05 DIAGNOSIS — H2511 Age-related nuclear cataract, right eye: Secondary | ICD-10-CM | POA: Insufficient documentation

## 2018-08-05 DIAGNOSIS — E7800 Pure hypercholesterolemia, unspecified: Secondary | ICD-10-CM | POA: Diagnosis not present

## 2018-08-05 DIAGNOSIS — E1140 Type 2 diabetes mellitus with diabetic neuropathy, unspecified: Secondary | ICD-10-CM | POA: Diagnosis not present

## 2018-08-05 DIAGNOSIS — I10 Essential (primary) hypertension: Secondary | ICD-10-CM | POA: Insufficient documentation

## 2018-08-05 DIAGNOSIS — Z85828 Personal history of other malignant neoplasm of skin: Secondary | ICD-10-CM | POA: Insufficient documentation

## 2018-08-05 DIAGNOSIS — Z853 Personal history of malignant neoplasm of breast: Secondary | ICD-10-CM | POA: Insufficient documentation

## 2018-08-05 DIAGNOSIS — Z79899 Other long term (current) drug therapy: Secondary | ICD-10-CM | POA: Insufficient documentation

## 2018-08-05 HISTORY — PX: CATARACT EXTRACTION W/PHACO: SHX586

## 2018-08-05 HISTORY — DX: Presence of dental prosthetic device (complete) (partial): Z97.2

## 2018-08-05 LAB — GLUCOSE, CAPILLARY
Glucose-Capillary: 94 mg/dL (ref 70–99)
Glucose-Capillary: 116 mg/dL — ABNORMAL HIGH (ref 70–99)

## 2018-08-05 SURGERY — PHACOEMULSIFICATION, CATARACT, WITH IOL INSERTION
Anesthesia: Monitor Anesthesia Care | Site: Eye | Laterality: Right

## 2018-08-05 MED ORDER — LIDOCAINE HCL (PF) 2 % IJ SOLN
INTRAOCULAR | Status: DC | PRN
  Administered 2018-08-05: 1 mL

## 2018-08-05 MED ORDER — TETRACAINE HCL 0.5 % OP SOLN
1.0000 [drp] | OPHTHALMIC | Status: DC | PRN
  Administered 2018-08-05 (×3): 1 [drp] via OPHTHALMIC

## 2018-08-05 MED ORDER — LACTATED RINGERS IV SOLN: INTRAVENOUS | Status: DC

## 2018-08-05 MED ORDER — NA CHONDROIT SULF-NA HYALURON 40-17 MG/ML IO SOLN
INTRAOCULAR | Status: DC | PRN
  Administered 2018-08-05: 1 mL via INTRAOCULAR

## 2018-08-05 MED ORDER — MOXIFLOXACIN HCL 0.5 % OP SOLN
OPHTHALMIC | Status: DC | PRN
  Administered 2018-08-05: 0.2 mL via OPHTHALMIC

## 2018-08-05 MED ORDER — EPINEPHRINE PF 1 MG/ML IJ SOLN
INTRAOCULAR | Status: DC | PRN
  Administered 2018-08-05: 09:00:00 72 mL via OPHTHALMIC

## 2018-08-05 MED ORDER — MIDAZOLAM HCL 2 MG/2ML IJ SOLN
INTRAMUSCULAR | Status: DC | PRN
  Administered 2018-08-05: 2 mg via INTRAVENOUS

## 2018-08-05 MED ORDER — FENTANYL CITRATE (PF) 100 MCG/2ML IJ SOLN
INTRAMUSCULAR | Status: DC | PRN
  Administered 2018-08-05: 50 ug via INTRAVENOUS

## 2018-08-05 MED ORDER — BRIMONIDINE TARTRATE-TIMOLOL 0.2-0.5 % OP SOLN
OPHTHALMIC | Status: DC | PRN
  Administered 2018-08-05: 1 [drp] via OPHTHALMIC

## 2018-08-05 MED ORDER — ARMC OPHTHALMIC DILATING DROPS
1.0000 "application " | OPHTHALMIC | Status: DC | PRN
  Administered 2018-08-05 (×3): 1 via OPHTHALMIC

## 2018-08-05 SURGICAL SUPPLY — 19 items
CANNULA ANT/CHMB 27G (MISCELLANEOUS) ×1 IMPLANT
CANNULA ANT/CHMB 27GA (MISCELLANEOUS) ×2 IMPLANT
GLOVE SURG LX 8.0 MICRO (GLOVE) ×1
GLOVE SURG LX STRL 8.0 MICRO (GLOVE) ×1 IMPLANT
GLOVE SURG TRIUMPH 8.0 PF LTX (GLOVE) ×2 IMPLANT
GOWN STRL REUS W/ TWL LRG LVL3 (GOWN DISPOSABLE) ×2 IMPLANT
GOWN STRL REUS W/TWL LRG LVL3 (GOWN DISPOSABLE) ×4
LENS IOL TECNIS ITEC 22.0 (Intraocular Lens) ×1 IMPLANT
MARKER SKIN DUAL TIP RULER LAB (MISCELLANEOUS) ×2 IMPLANT
NDL FILTER BLUNT 18X1 1/2 (NEEDLE) ×1 IMPLANT
NDL RETROBULBAR .5 NSTRL (NEEDLE) ×2 IMPLANT
NEEDLE FILTER BLUNT 18X 1/2SAF (NEEDLE) ×1
NEEDLE FILTER BLUNT 18X1 1/2 (NEEDLE) ×1 IMPLANT
PACK CATARACT BRASINGTON (MISCELLANEOUS) ×1 IMPLANT
PACK EYE AFTER SURG (MISCELLANEOUS) ×2 IMPLANT
PACK OPTHALMIC (MISCELLANEOUS) ×2 IMPLANT
SYR 3ML LL SCALE MARK (SYRINGE) ×2 IMPLANT
SYR TB 1ML LUER SLIP (SYRINGE) ×2 IMPLANT
WIPE NON LINTING 3.25X3.25 (MISCELLANEOUS) ×2 IMPLANT

## 2018-08-05 NOTE — Anesthesia Postprocedure Evaluation (Signed)
Anesthesia Post Note  Patient: Ashley Bray  Procedure(s) Performed: CATARACT EXTRACTION PHACO AND INTRAOCULAR LENS PLACEMENT (IOC) RIGHT (Right Eye)  Patient location during evaluation: PACU Anesthesia Type: MAC Level of consciousness: awake and alert, oriented and patient cooperative Pain management: pain level controlled Vital Signs Assessment: post-procedure vital signs reviewed and stable Respiratory status: spontaneous breathing, nonlabored ventilation and respiratory function stable Cardiovascular status: blood pressure returned to baseline and stable Postop Assessment: adequate PO intake Anesthetic complications: no    Darrin Nipper

## 2018-08-05 NOTE — H&P (Signed)
All labs reviewed. Abnormal studies sent to patients PCP when indicated.  Previous H&P reviewed, patient examined, there are NO CHANGES.  Gwyndolyn Saxon Porfilio5/19/20208:03 AM

## 2018-08-05 NOTE — Anesthesia Procedure Notes (Signed)
Procedure Name: MAC Date/Time: 08/05/2018 8:10 AM Performed by: Janna Arch, CRNA Pre-anesthesia Checklist: Patient identified, Emergency Drugs available, Suction available, Timeout performed and Patient being monitored Patient Re-evaluated:Patient Re-evaluated prior to induction Oxygen Delivery Method: Nasal cannula Placement Confirmation: positive ETCO2

## 2018-08-05 NOTE — Transfer of Care (Signed)
Immediate Anesthesia Transfer of Care Note  Patient: Ashley Bray  Procedure(s) Performed: CATARACT EXTRACTION PHACO AND INTRAOCULAR LENS PLACEMENT (IOC) RIGHT (Right Eye)  Patient Location: PACU  Anesthesia Type: MAC  Level of Consciousness: awake, alert  and patient cooperative  Airway and Oxygen Therapy: Patient Spontanous Breathing and Patient connected to supplemental oxygen  Post-op Assessment: Post-op Vital signs reviewed, Patient's Cardiovascular Status Stable, Respiratory Function Stable, Patent Airway and No signs of Nausea or vomiting  Post-op Vital Signs: Reviewed and stable  Complications: No apparent anesthesia complications

## 2018-08-05 NOTE — Op Note (Signed)
PREOPERATIVE DIAGNOSIS:  Nuclear sclerotic cataract of the right eye.   POSTOPERATIVE DIAGNOSIS:  H25.11 CATARACT   OPERATIVE PROCEDURE: Procedure(s): CATARACT EXTRACTION PHACO AND INTRAOCULAR LENS PLACEMENT (IOC) RIGHT   SURGEON:  Birder Robson, MD.   ANESTHESIA:  Anesthesiologist: Darrin Nipper, MD CRNA: Janna Arch, CRNA  1.      Managed anesthesia care. 2.      0.50ml of Shugarcaine was instilled in the eye following the paracentesis.   COMPLICATIONS:  None.   TECHNIQUE:   Stop and chop   DESCRIPTION OF PROCEDURE:  The patient was examined and consented in the preoperative holding area where the aforementioned topical anesthesia was applied to the right eye and then brought back to the Operating Room where the right eye was prepped and draped in the usual sterile ophthalmic fashion and a lid speculum was placed. A paracentesis was created with the side port blade and the anterior chamber was filled with viscoelastic. A near clear corneal incision was performed with the steel keratome. A continuous curvilinear capsulorrhexis was performed with a cystotome followed by the capsulorrhexis forceps. Hydrodissection and hydrodelineation were carried out with BSS on a blunt cannula. The lens was removed in a stop and chop  technique and the remaining cortical material was removed with the irrigation-aspiration handpiece. The capsular bag was inflated with viscoelastic and the Technis [id number]  lens was placed in the capsular bag without complication. The remaining viscoelastic was removed from the eye with the irrigation-aspiration handpiece. The wounds were hydrated. The anterior chamber was flushed with Miostat and the eye was inflated to physiologic pressure. 0.54ml of Vigamox was placed in the anterior chamber. The wounds were found to be water tight. The eye was dressed with Vigamox. The patient was given protective glasses to wear throughout the day and a shield with which to sleep  tonight. The patient was also given drops with which to begin a drop regimen today and will follow-up with me in one day. Implant Name Type Inv. Item Serial No. Manufacturer Lot No. LRB No. Used  LENS IOL DIOP 22.0 - [id number] Intraocular Lens LENS IOL DIOP 22.0 [id number]  Right 1   Procedure(s) with comments: CATARACT EXTRACTION PHACO AND INTRAOCULAR LENS PLACEMENT (IOC) RIGHT (Right) - PT WOULD LIKE TO BE FIRST CASE Diabetic - oal meds  Electronically signed: Birder Robson 08/05/2018 8:40 AM

## 2018-08-06 ENCOUNTER — Encounter: Payer: Self-pay | Admitting: Ophthalmology

## 2018-08-06 ENCOUNTER — Ambulatory Visit: Payer: Medicare Other | Admitting: Radiation Oncology

## 2018-09-29 IMAGING — CT CT CHEST W/ CM
2 of 3 series · 15 of 36 positions shown, 18 images · IV contrast (iopamidol)
Comparison: Multiple exams, including 07/12/2015

CLINICAL DATA: Left breast cancer, radiation pneumonitis.

EXAM:
CT CHEST WITH CONTRAST
TECHNIQUE: Multidetector CT imaging of the chest was performed during
intravenous contrast administration.
CONTRAST:  75mL [id number] IOPAMIDOL ([id number]) INJECTION 61%

[Series 2: axial st · axial · 0.63mm/px · z∈[-322,-44]mm · 12 of 165 slices shown, 15 images]
[im 13/165  mediastinal]
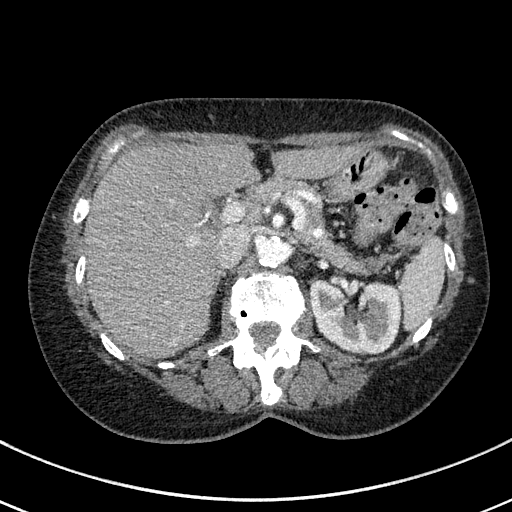
[im 13/165  lung]
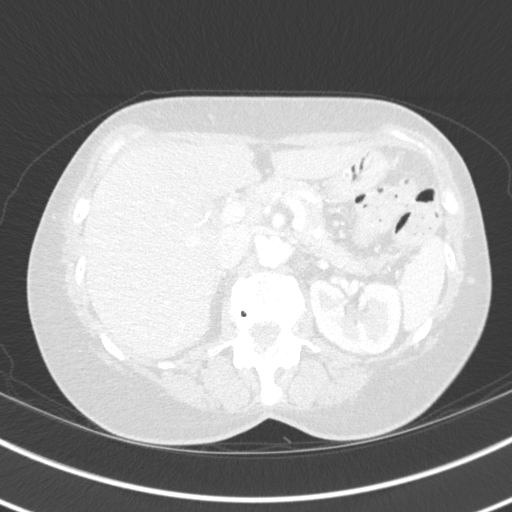
[im 25/165  lung]
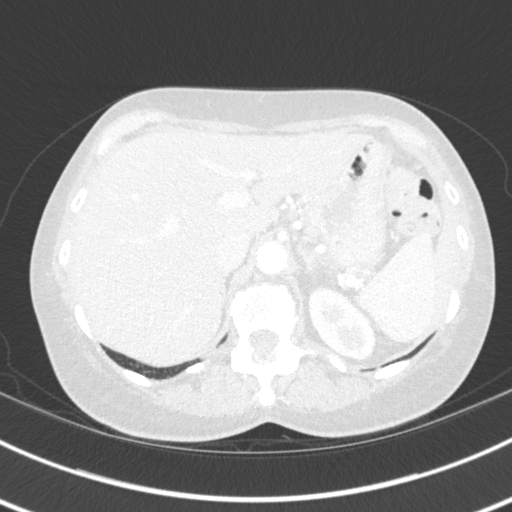
[im 37/165  lung]
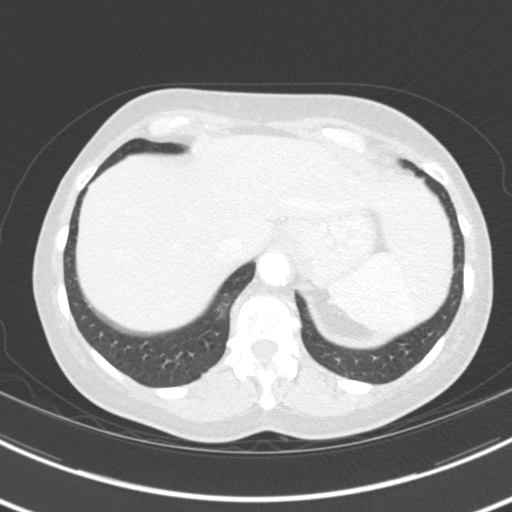
[im 49/165  lung]
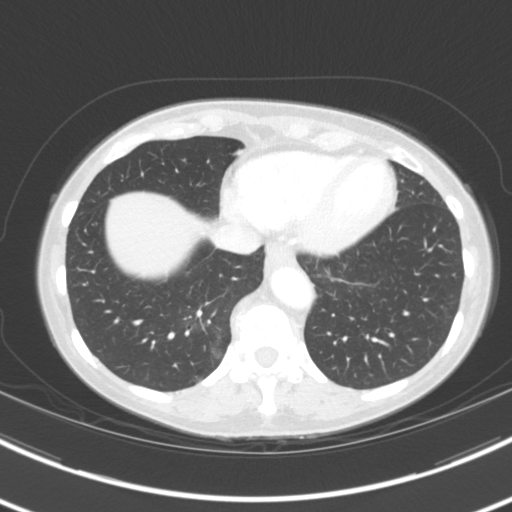
[im 61/165  mediastinal]
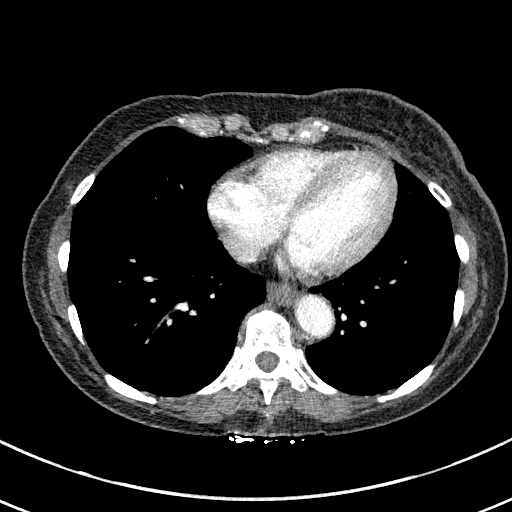
[im 61/165  lung]
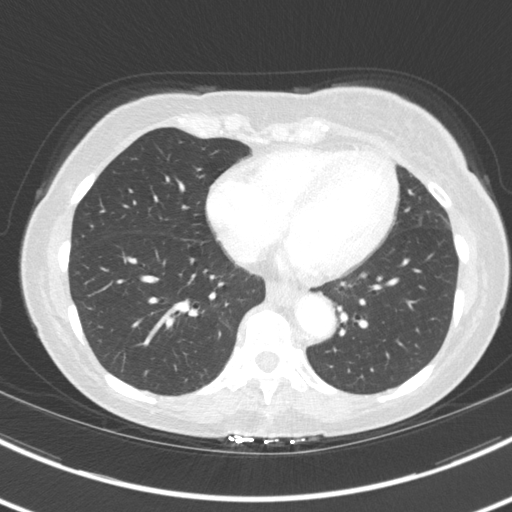
[im 73/165  lung]
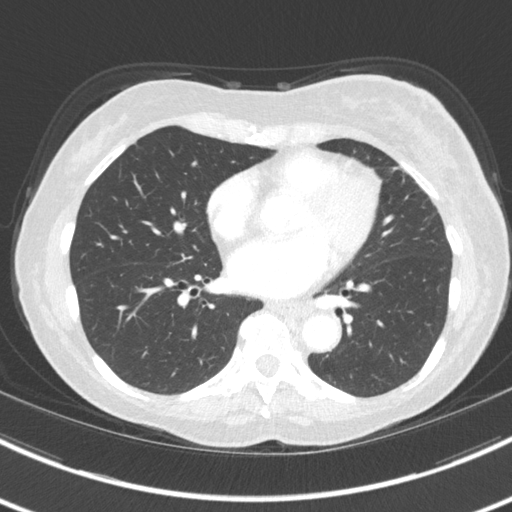
[im 92/165  lung]
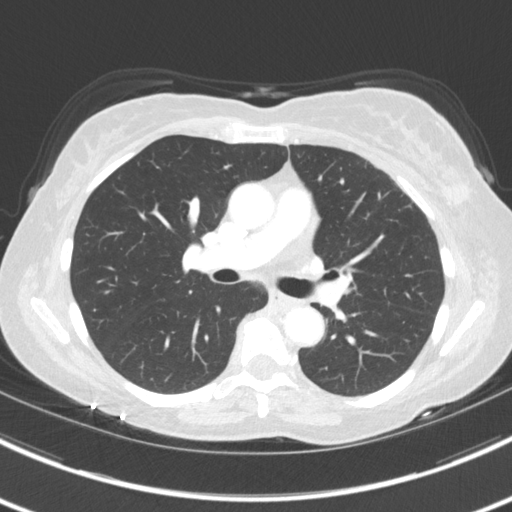
[im 104/165  lung]
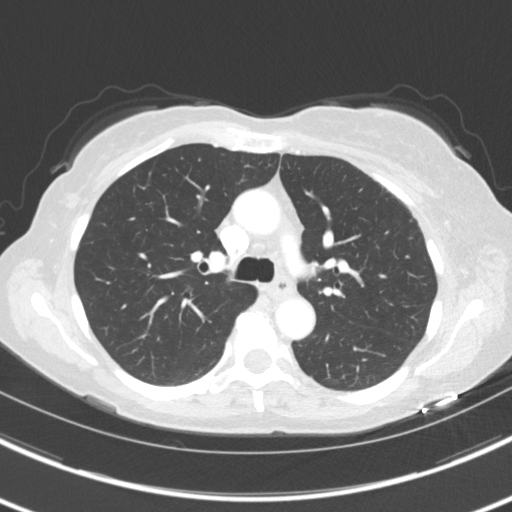
[im 116/165  mediastinal]
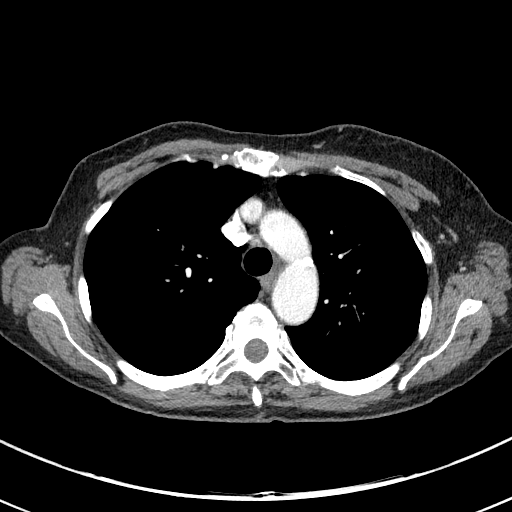
[im 116/165  lung]
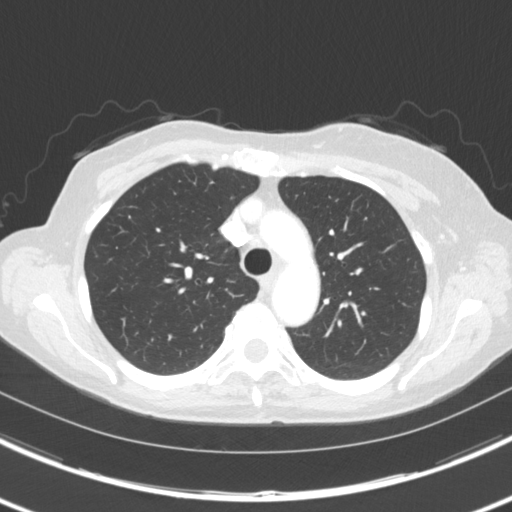
[im 128/165  lung]
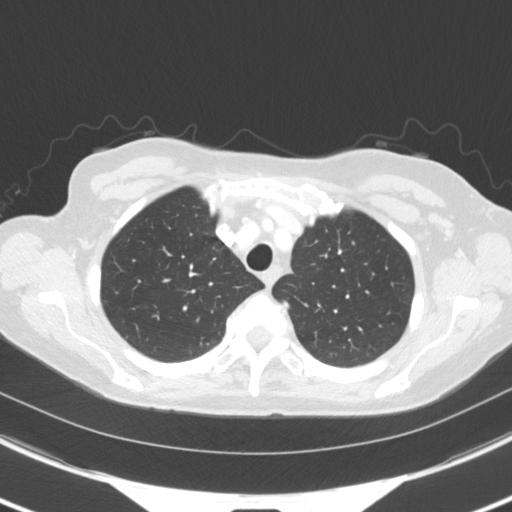
[im 140/165  lung]
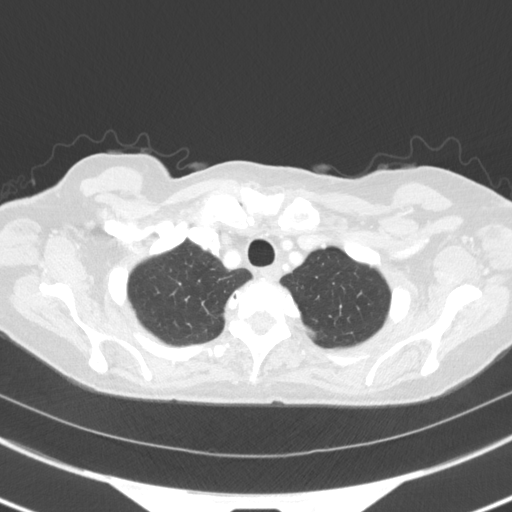
[im 152/165  lung]
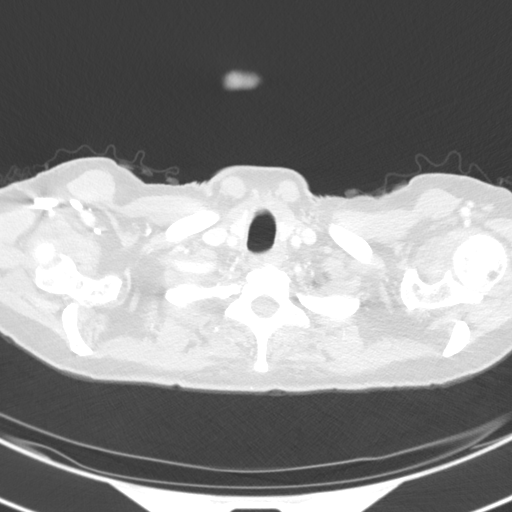

[Series 5: coronal · coronal · 0.67mm/px · 3 of 106 slices shown]
[im 22/106  lung]
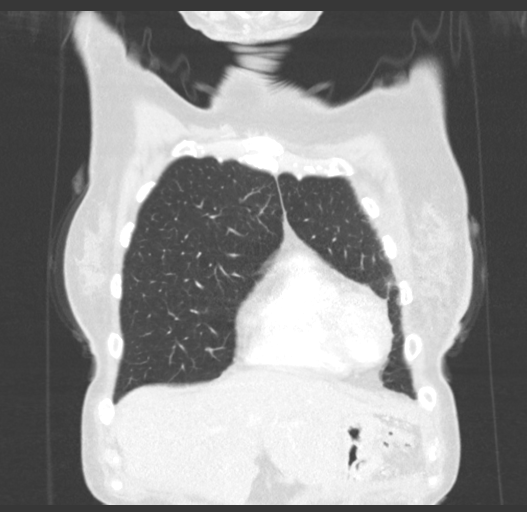
[im 43/106  lung]
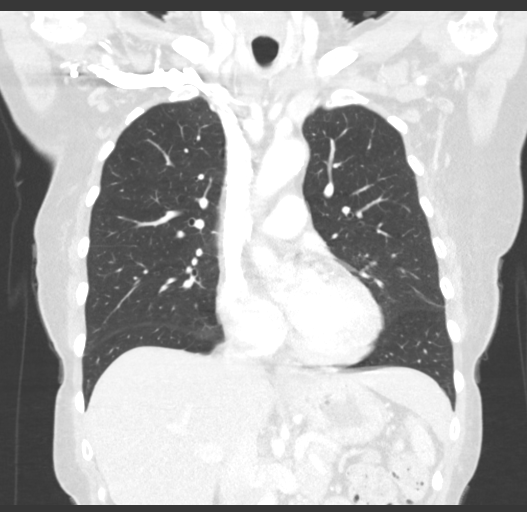
[im 64/106  lung]
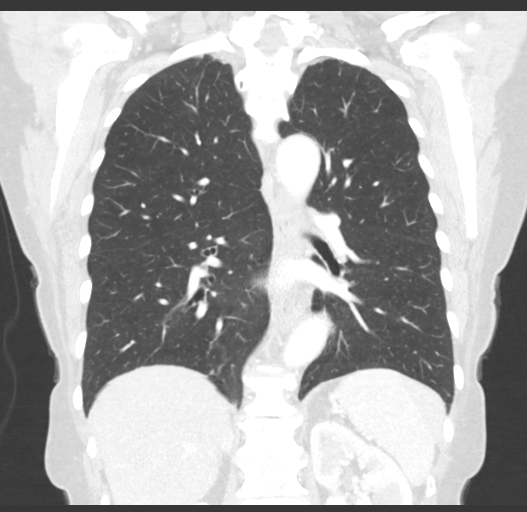

[15 of 36 positions shown; findings below may reference images not displayed]

FINDINGS: Cardiovascular: Atherosclerotic calcification of the aortic arch.

Mediastinum/Nodes: Right hilar node 0.7 cm in short axis on image
69/2.

Lungs/Pleura: 6 by 4 mm ground-glass density pulmonary nodule in the
apical segment right upper lobe image 32/3, new from prior. There is
some adjacent plugging of a subsegmental bronchus in the right upper
lobe.

Minimal atelectasis medially in the right lower lobe on image 132
series 3. Mild scarring in the right lower lobe.

Mild radiation fibrosis anteriorly in the left upper lobe

Upper Abdomen: Unremarkable

Musculoskeletal: Degenerative glenohumeral arthropathy bilaterally.
Degenerative sternoclavicular arthropathy, right greater than left.
Thoracic and lower cervical spondylosis. No compelling findings of
osseous metastatic disease.

Soft tissue fullness in the left upper outer breast, not changed
from prior, image 75/2.
IMPRESSION: 1. New 5 mm in diameter ground-glass density pulmonary nodule in the
apical segment right upper lobe. There is some adjacent mild airway
plugging. No follow-up recommended. This recommendation follows the
consensus statement: Guidelines for Management of Incidental
Pulmonary Nodules Detected on CT Images: From the [HOSPITAL]
2. Mild radiation fibrosis anteriorly in the left upper lobe.
3. Soft tissue density in the left upper outer breast, not changed
from prior exam.
4. Degenerative glenohumeral and sternoclavicular arthropathy
bilaterally.
5. Minimal atelectasis in the right lower lobe with some associated
scarring.
6. Atherosclerotic calcification of the aortic arch.

## 2018-10-06 ENCOUNTER — Other Ambulatory Visit: Payer: Self-pay

## 2018-10-06 ENCOUNTER — Encounter: Payer: Self-pay | Admitting: Radiation Oncology

## 2018-10-06 ENCOUNTER — Ambulatory Visit
Admission: RE | Admit: 2018-10-06 | Discharge: 2018-10-06 | Disposition: A | Discharge status: Home / Self Care | Payer: Medicare Other | Source: Ambulatory Visit | Attending: Radiation Oncology | Admitting: Radiation Oncology

## 2018-10-06 VITALS — BP 149/73 | HR 81 | Temp 98.2°F | Wt 130.8 lb

## 2018-10-06 DIAGNOSIS — Z923 Personal history of irradiation: Secondary | ICD-10-CM | POA: Insufficient documentation

## 2018-10-06 DIAGNOSIS — C50912 Malignant neoplasm of unspecified site of left female breast: Secondary | ICD-10-CM

## 2018-10-06 DIAGNOSIS — Z86000 Personal history of in-situ neoplasm of breast: Secondary | ICD-10-CM | POA: Insufficient documentation

## 2018-10-06 NOTE — Progress Notes (Signed)
Radiation Oncology Follow up Note  Name: Ashley Bray   Date:   10/06/2018 MRN:  [id number] DOB: 04/27/1940    This 78 y.o. 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  She is seen today in routine follow-up and is doing well.  She specifically denies breast tenderness cough or bone pain..  She is never been put on antiestrogen therapy.  Her last mammogram was close to a year ago I have reviewed that it was BI-RADS 2 benign.  COMPLICATIONS OF TREATMENT: none  FOLLOW UP COMPLIANCE: keeps appointments   PHYSICAL EXAM:  BP (!) 149/73 (BP Location: Right Arm, Patient Position: Sitting)   Pulse 81   Temp 98.2 F (36.8 C) (Tympanic)   Wt 130 lb 13.5 oz (59.4 kg)   BMI 24.72 kg/m  Lungs are clear to A&P cardiac examination essentially unremarkable with regular rate and rhythm. No dominant mass or nodularity is noted in either breast in 2 positions examined. Incision is well-healed. No axillary or supraclavicular adenopathy is appreciated. Cosmetic result is excellent.  Well-developed well-nourished patient in NAD. HEENT reveals PERLA, EOMI, discs not visualized.  Oral cavity is clear. No oral mucosal lesions are identified. Neck is clear without evidence of cervical or supraclavicular adenopathy. Lungs are clear to A&P. Cardiac examination is essentially unremarkable with regular rate and rhythm without murmur rub or thrill. Abdomen is benign with no organomegaly or masses noted. Motor sensory and DTR levels are equal and symmetric in the upper and lower extremities. Cranial nerves II through XII are grossly intact. Proprioception is intact. No peripheral adenopathy or edema is  identified. No motor or sensory levels are noted. Crude visual fields are within normal range.  RADIOLOGY RESULTS: Mammogram reviewed compatible with above-stated findings  PLAN: Present time patient is doing well with no evidence of disease now close to 5 years out.  I am going to discontinue follow-up care at this point.  Patient knows to call in the future with any concerns at any time.  She is already been rescheduled for mammogram in September.  I have asked her to copy me that so I can review it when it becomes available.  I would like to take this opportunity to thank you for allowing me to participate in the care of your patient.Noreene Filbert, MD

## 2018-11-13 ENCOUNTER — Other Ambulatory Visit: Payer: Self-pay | Admitting: Internal Medicine

## 2018-11-13 DIAGNOSIS — Z9889 Other specified postprocedural states: Secondary | ICD-10-CM

## 2018-11-13 DIAGNOSIS — Z853 Personal history of malignant neoplasm of breast: Secondary | ICD-10-CM

## 2018-12-03 ENCOUNTER — Encounter: Payer: Self-pay | Admitting: Oncology

## 2018-12-03 ENCOUNTER — Other Ambulatory Visit: Payer: Self-pay

## 2018-12-03 DIAGNOSIS — M5412 Radiculopathy, cervical region: Secondary | ICD-10-CM | POA: Insufficient documentation

## 2018-12-03 DIAGNOSIS — M436 Torticollis: Secondary | ICD-10-CM | POA: Insufficient documentation

## 2018-12-03 DIAGNOSIS — M2540 Effusion, unspecified joint: Secondary | ICD-10-CM | POA: Insufficient documentation

## 2018-12-03 NOTE — Progress Notes (Signed)
Patient stated that she had been doing well with no complaints. 

## 2018-12-04 ENCOUNTER — Other Ambulatory Visit: Payer: Self-pay

## 2018-12-04 ENCOUNTER — Inpatient Hospital Stay: Payer: Medicare Other | Attending: Oncology | Admitting: Oncology

## 2018-12-04 VITALS — BP 145/78 | HR 86 | Temp 98.3°F | Resp 16 | Ht 61.5 in | Wt 132.0 lb

## 2018-12-04 DIAGNOSIS — Z791 Long term (current) use of non-steroidal anti-inflammatories (NSAID): Secondary | ICD-10-CM | POA: Insufficient documentation

## 2018-12-04 DIAGNOSIS — Z79899 Other long term (current) drug therapy: Secondary | ICD-10-CM | POA: Diagnosis not present

## 2018-12-04 DIAGNOSIS — Z96611 Presence of right artificial shoulder joint: Secondary | ICD-10-CM | POA: Insufficient documentation

## 2018-12-04 DIAGNOSIS — M069 Rheumatoid arthritis, unspecified: Secondary | ICD-10-CM | POA: Diagnosis not present

## 2018-12-04 DIAGNOSIS — E785 Hyperlipidemia, unspecified: Secondary | ICD-10-CM | POA: Insufficient documentation

## 2018-12-04 DIAGNOSIS — Z853 Personal history of malignant neoplasm of breast: Secondary | ICD-10-CM | POA: Diagnosis not present

## 2018-12-04 DIAGNOSIS — Z923 Personal history of irradiation: Secondary | ICD-10-CM | POA: Diagnosis not present

## 2018-12-04 DIAGNOSIS — D0512 Intraductal carcinoma in situ of left breast: Secondary | ICD-10-CM | POA: Insufficient documentation

## 2018-12-04 DIAGNOSIS — E119 Type 2 diabetes mellitus without complications: Secondary | ICD-10-CM | POA: Insufficient documentation

## 2018-12-04 DIAGNOSIS — Z7984 Long term (current) use of oral hypoglycemic drugs: Secondary | ICD-10-CM | POA: Insufficient documentation

## 2018-12-04 DIAGNOSIS — Z96653 Presence of artificial knee joint, bilateral: Secondary | ICD-10-CM | POA: Diagnosis not present

## 2018-12-04 DIAGNOSIS — I10 Essential (primary) hypertension: Secondary | ICD-10-CM | POA: Diagnosis not present

## 2018-12-04 DIAGNOSIS — Z90710 Acquired absence of both cervix and uterus: Secondary | ICD-10-CM | POA: Insufficient documentation

## 2018-12-05 NOTE — Progress Notes (Signed)
Hematology/Oncology Consult note [facility]  Telephone:([phone] [phone]  Patient Care Team: Madelyn Brunner, MD as PCP - General (Internal Medicine) Madelyn Brunner, MD (Internal Medicine)   Name of the patient: Ashley Bray  [id number]  04-06-1940   Date of visit: 12/05/18  Diagnosis-history of left breast DCIS  Chief complaint/ Reason for visit-routine follow-up of DCIS  Heme/Onc history: 78 year old female witha history ofstage 0 (Tis N0 M0) ductal carcinoma in situ of the left breast status post wide local excision for adjuvant radiation therapy.(August, 2015) estrogen receptor is positive. Progesterone receptor is positive.Patient was taken off tamoxifen because of unspecified bone pain on April 19, 2014. Abnormal CT scan of the chest (March, 2016).Bronchoscopy was done which revealed no evidence of malignancy. All the cultures are negative. 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  HENT: Negative for congestion, ear discharge and nosebleeds.   Eyes: Negative for blurred vision.  Respiratory: Negative for cough, hemoptysis, sputum production, shortness of breath and wheezing.   Cardiovascular: Negative for chest pain, palpitations, orthopnea and claudication.  Gastrointestinal: Negative for abdominal pain, blood in stool, constipation, diarrhea, heartburn, melena, nausea and vomiting.  Genitourinary: Negative for dysuria, flank pain, frequency, hematuria and urgency.  Musculoskeletal: Negative for back pain, joint pain and myalgias.  Skin: Negative for rash.   Neurological: Negative for dizziness, tingling, focal weakness, seizures, weakness and headaches.  Endo/Heme/Allergies: Does not bruise/bleed easily.  Psychiatric/Behavioral: Negative for depression and suicidal ideas. The patient does not have insomnia.        Allergies  Allergen Reactions  . Codeine Nausea And Vomiting     Past Medical History:  Diagnosis Date  . Breast CA ([place])    left  . Breast cancer ([place]) 2015   Ductal carcinoma in situ of the left breast. Had radiation tx  . Cough    CHRONIC  . Diabetes ([place])   . GERD (gastroesophageal reflux disease)   . HLD (hyperlipidemia)   . Hypertension    NO MEDS  . Personal history of radiation therapy   . Pneumonia    SMALL SPOT ON LUNG, HX RADIATION PNEUMONITIS   . PONV (postoperative nausea and vomiting)    after hysterectomy  . RA (rheumatoid arthritis) ([place])   . Radiation pneumonitis ([place])   . Wears dentures    partial upper     Past Surgical History:  Procedure Laterality Date  . ABDOMINAL HYSTERECTOMY  1991  . ANTERIOR CERVICAL DECOMP/DISCECTOMY FUSION N/A 03/16/2015   Procedure: ANTERIOR  CERVICAL DECOMPRESSION FUSION CERVICAL TWO-THREE,CERVICAL THREE-FOUR WITH REMOVAL OF HARDWARE AT CERVICAL FOUR-FIVE.;  Surgeon: Earnie Larsson, MD;  Location: [facility];  Service: Neurosurgery;  Laterality: N/A;  . APPENDECTOMY    . BREAST BIOPSY Left 08/31/2013   positive  . BREAST LUMPECTOMY Left 10/06/2013 & 11/03/2013  . CARPAL TUNNEL RELEASE Bilateral   . CATARACT EXTRACTION W/PHACO Left 05/13/2018   Procedure: CATARACT EXTRACTION PHACO AND INTRAOCULAR LENS PLACEMENT ([place]) LEFT;  Surgeon: Birder Robson, MD;  Location: [facility] ORS;  Service: Ophthalmology;  Laterality: Left;  [place]  00:32 CDE 3.76 Fluid pack lot # [id number]  . CATARACT EXTRACTION  W/PHACO Right 08/05/2018   Procedure: CATARACT EXTRACTION PHACO AND INTRAOCULAR LENS PLACEMENT ([place]) RIGHT;  Surgeon: Birder Robson, MD;  Location: [facility];  Service:  Ophthalmology;  Laterality: Right;  PT WOULD LIKE TO BE FIRST CASE Diabetic - oal meds  . neck fusion    . NECK SURGERY  10/2016  . REPLACEMENT TOTAL KNEE BILATERAL     x2  . right shoulder replacement Right   . ROTATOR CUFF REPAIR Right     Social History   Socioeconomic History  . Marital status: Married    Spouse name: Not on file  . Number of children: Not on file  . Years of education: Not on file  . Highest education level: Not on file  Occupational History  . Not on file  Social Needs  . Financial resource strain: Not on file  . Food insecurity    Worry: Not on file    Inability: Not on file  . Transportation needs    Medical: Not on file    Non-medical: Not on file  Tobacco Use  . Smoking status: Never Smoker  . Smokeless tobacco: Never Used  Substance and Sexual Activity  . Alcohol use: Yes    Alcohol/week: 2.0 standard drinks    Types: 2 Glasses of wine per week    Comment: occasional wine  . Drug use: No  . Sexual activity: Not on file  Lifestyle  . Physical activity    Days per week: Not on file    Minutes per session: Not on file  . Stress: Not on file  Relationships  . Social Herbalist on phone: Not on file    Gets together: Not on file    Attends religious service: Not on file    Active member of club or organization: Not on file    Attends meetings of clubs or organizations: Not on file    Relationship status: Not on file  . Intimate partner violence    Fear of current or ex partner: Not on file    Emotionally abused: Not on file    Physically abused: Not on file    Forced sexual activity: Not on file  Other Topics Concern  . Not on file  Social History Narrative  . Not on file    Family History  Problem Relation Age of Onset  . Cancer Mother        kidney or bladder-unsure of which one  . Cancer Brother 34       lung  . Cancer Brother 81       lung     Current Outpatient Medications:  .  acetaminophen (TYLENOL) 325  MG tablet, Take 325 mg by mouth daily as needed for moderate pain or headache., Disp: , Rfl:  .  Blood Glucose Monitoring Suppl (ONE TOUCH ULTRA 2) W/DEVICE KIT, , Disp: , Rfl: 11 .  cetirizine (ZYRTEC ALLERGY) 10 MG tablet, Take 1 tablet (10 mg total) by mouth daily. (Patient taking differently: Take 10 mg by mouth daily as needed for allergies. ), Disp: 30 tablet, Rfl: 12 .  folic acid (FOLVITE) 1 MG tablet, Take 1 mg by mouth daily., Disp: , Rfl: 11 .  gabapentin (NEURONTIN) 100 MG capsule, Take 100 mg by mouth 3 (three) times daily., Disp: , Rfl:  .  hydroxychloroquine (PLAQUENIL) 200 MG tablet, Take 200 mg by mouth daily., Disp: , Rfl:  .  metFORMIN (GLUCOPHAGE) 500 MG tablet, Take 500 mg by  mouth daily. , Disp: , Rfl:  .  pravastatin (PRAVACHOL) 40 MG tablet, Take 40 mg by mouth at bedtime. , Disp: , Rfl:  .  vitamin B-12 (CYANOCOBALAMIN) 1000 MCG tablet, Take 1,000 mcg by mouth 2 (two) times daily., Disp: , Rfl:  .  augmented betamethasone dipropionate (DIPROLENE-AF) 0.05 % ointment, Apply topically 2 (two) times daily., Disp: , Rfl:  .  GLUCAGON EMERGENCY 1 MG injection, Inject 1 mg into the muscle once as needed (low blood sugar). , Disp: , Rfl: 0 .  meloxicam (MOBIC) 7.5 MG tablet, Take 7.5 mg by mouth daily as needed for pain. , Disp: , Rfl:   Physical exam:  Vitals:   12/04/18 0936  BP: (!) 145/78  Pulse: 86  Resp: 16  Temp: 98.3 F (36.8 C)  TempSrc: Tympanic  Weight: 132 lb (59.9 kg)  Height: 5' 1.5" (1.562 m)   Physical Exam Constitutional:      General: She is not in acute distress. HENT:     Head: Normocephalic and atraumatic.  Eyes:     Pupils: Pupils are equal, round, and reactive to light.  Neck:     Musculoskeletal: Normal range of motion.  Cardiovascular:     Rate and Rhythm: Normal rate and regular rhythm.     Heart sounds: Normal heart sounds.  Pulmonary:     Effort: Pulmonary effort is normal.     Breath sounds: Normal breath sounds.  Abdominal:      General: Bowel sounds are normal.     Palpations: Abdomen is soft.  Skin:    General: Skin is warm and dry.  Neurological:     Mental Status: She is alert and oriented to person, place, and time.     Breast exam was performed in seated and lying down position. Patient is status post left lumpectomy with a well-healed surgical scar.there is induration from scarring noted around the area of lumpectomy. No evidence of any palpable masses. No evidence of axillary adenopathy. No evidence of any palpable masses or lumps in the right breast. No evidence of right axillary adenopathy  CMP Latest Ref Rng & Units 06/01/2016  Glucose 65 - 99 mg/dL 124(H)  BUN 6 - 20 mg/dL 12  Creatinine 0.44 - 1.00 mg/dL 0.47  Sodium 135 - 145 mmol/L 132(L)  Potassium 3.5 - 5.1 mmol/L 4.3  Chloride 101 - 111 mmol/L 95(L)  CO2 22 - 32 mmol/L 31  Calcium 8.9 - 10.3 mg/dL 9.4  Total Protein 6.5 - 8.1 g/dL 7.6  Total Bilirubin 0.3 - 1.2 mg/dL 0.8  Alkaline Phos 38 - 126 U/L 54  AST 15 - 41 U/L 28  ALT 14 - 54 U/L 16   CBC Latest Ref Rng & Units 06/01/2016  WBC 3.6 - 11.0 K/uL 5.5  Hemoglobin 12.0 - 16.0 g/dL 13.8  Hematocrit 35.0 - 47.0 % 40.8  Platelets 150 - 440 K/uL 291      Assessment and plan- Patient is a 78 y.o. female with h/o left breast DCIS s/p lumpectomy and radiation in 2015. She took hormone therapy briefly but came off it due to arthralgias.   She is here for routine follow-up of left breast DCIS  Clinically patient is doing well.  No evidence of recurrence on today's exam.  She is due for mammogram at this time which we will order.  This year marks 5 years since her diagnosis.  She can continue to follow-up with Dr. Edwina Barth at this time who can order her  yearly mammograms and continue with yearly breast exams.  She does not require any oncology follow-up at this time   Visit Diagnosis 1. History of breast cancer      Dr. Randa Evens, MD, MPH [facility]  [phone] 12/05/2018 8:27 AM

## 2018-12-16 ENCOUNTER — Other Ambulatory Visit: Payer: Self-pay

## 2018-12-16 ENCOUNTER — Encounter: Payer: Self-pay | Admitting: Occupational Therapy

## 2018-12-16 ENCOUNTER — Ambulatory Visit: Payer: Medicare Other | Attending: Rheumatology | Admitting: Occupational Therapy

## 2018-12-16 DIAGNOSIS — M79642 Pain in left hand: Secondary | ICD-10-CM | POA: Diagnosis not present

## 2018-12-16 DIAGNOSIS — R278 Other lack of coordination: Secondary | ICD-10-CM | POA: Insufficient documentation

## 2018-12-16 DIAGNOSIS — M6281 Muscle weakness (generalized): Secondary | ICD-10-CM | POA: Diagnosis present

## 2018-12-16 DIAGNOSIS — M79641 Pain in right hand: Secondary | ICD-10-CM | POA: Insufficient documentation

## 2018-12-17 NOTE — Therapy (Signed)
[facility] [address], [place], [address] Phone: [phone]   Fax:  [phone]  Occupational Therapy Evaluation  Patient Details  Name: Ashley Bray MRN: [id number] Date of Birth: 1940-10-19 No data recorded  Encounter Date: 12/16/2018    Past Medical History:  Diagnosis Date  . Breast CA ([place])    left  . Breast cancer ([place]) 2015   Ductal carcinoma in situ of the left breast. Had radiation tx  . Cough    CHRONIC  . Diabetes ([place])   . GERD (gastroesophageal reflux disease)   . HLD (hyperlipidemia)   . Hypertension    NO MEDS  . Personal history of radiation therapy   . Pneumonia    SMALL SPOT ON LUNG, HX RADIATION PNEUMONITIS   . PONV (postoperative nausea and vomiting)    after hysterectomy  . RA (rheumatoid arthritis) ([place])   . Radiation pneumonitis ([place])   . Wears dentures    partial upper    Past Surgical History:  Procedure Laterality Date  . ABDOMINAL HYSTERECTOMY  1991  . ANTERIOR CERVICAL DECOMP/DISCECTOMY FUSION N/A 03/16/2015   Procedure: ANTERIOR  CERVICAL DECOMPRESSION FUSION CERVICAL TWO-THREE,CERVICAL THREE-FOUR WITH REMOVAL OF HARDWARE AT CERVICAL FOUR-FIVE.;  Surgeon: Earnie Larsson, MD;  Location: [facility];  Service: Neurosurgery;  Laterality: N/A;  . APPENDECTOMY    . BREAST BIOPSY Left 08/31/2013   positive  . BREAST LUMPECTOMY Left 10/06/2013 & 11/03/2013  . CARPAL TUNNEL RELEASE Bilateral   . CATARACT EXTRACTION W/PHACO Left 05/13/2018   Procedure: CATARACT EXTRACTION PHACO AND INTRAOCULAR LENS PLACEMENT ([place]) LEFT;  Surgeon: Birder Robson, MD;  Location: [facility];  Service: Ophthalmology;  Laterality: Left;  [place]  00:32 CDE 3.76 Fluid pack lot # [id number]  . CATARACT EXTRACTION W/PHACO Right 08/05/2018   Procedure: CATARACT EXTRACTION PHACO AND INTRAOCULAR LENS PLACEMENT ([place]) RIGHT;  Surgeon: Birder Robson, MD;  Location: [facility];  Service:  Ophthalmology;  Laterality: Right;  PT WOULD LIKE TO BE FIRST CASE Diabetic - oal meds  . neck fusion    . NECK SURGERY  10/2016  . REPLACEMENT TOTAL KNEE BILATERAL     x2  . right shoulder replacement Right   . ROTATOR CUFF REPAIR Right     There were no vitals filed for this visit.                                    Patient will benefit from skilled therapeutic intervention in order to improve the following deficits and impairments:           Visit Diagnosis: Pain in left hand  Pain in right hand  Muscle weakness (generalized)  Other lack of coordination    Problem List Patient Active Problem List   Diagnosis Date Noted  . Cervical radiculitis 12/03/2018  . Effusion of joint of multiple sites 12/03/2018  . Stiff neck 12/03/2018  . Cervical spondylosis without myelopathy 05/14/2017  . Contusion of right hip region 03/14/2017  . Contusion of right knee 03/14/2017  . Shoulder pain 03/14/2017  . Complete tear of right rotator cuff 01/02/2017  . Bursitis of right hip 08/17/2016  . Impingement syndrome of shoulder region 03/01/2016  . RUL opacity 01/17/2016  . B12 deficiency 06/27/2015  . Pneumonitis 05/16/2015  . PNA (pneumonia) 05/16/2015  . Cervical stenosis of spinal canal 03/16/2015  . Cervical spinal stenosis 03/16/2015  .  Acute URI 03/10/2015  . URI, acute 02/16/2015  . Type 2 diabetes mellitus ([place]) 12/25/2014  . Neuropathy involving both lower extremities 09/28/2014  . Upper airway cough syndrome 09/16/2014  . 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 Lung consolidation ([place]) 05/25/2014  . Cough 05/25/2014  . Breast cancer in situ 03/23/2014  . Rheumatoid arthritis of multiple sites without rheumatoid factor ([place]) 03/23/2014  . Hyperlipidemia 05/04/2013  . HTN (hypertension) 05/04/2013   Jaivyn Gulla T Tamari Busic, OTR/L, CLT  Tita Terhaar 12/17/2018, 8:59 PM  [facility] [address], [place], [address] Phone: [phone]   Fax:  [phone]  Name: Ashley Bray MRN: [id number] Date of Birth: 1940/05/03

## 2018-12-21 NOTE — Therapy (Signed)
[facility] [address] [place], [address] Phone: [phone]   Fax:  [phone]  Occupational Therapy Evaluation  Patient Details  Name: Ashley Bray MRN: [id number] Date of Birth: 05/30/1940 Referring Provider (OT): Jefm Bryant   Encounter Date: 12/16/2018  OT End of Session - 12/21/18 2001    Visit Number  1    Number of Visits  6    OT Start Time  1000    OT Stop Time  1055    OT Time Calculation (min)  55 min    Activity Tolerance  Patient tolerated treatment well    Behavior During Therapy  [facility] for tasks assessed/performed       Past Medical History:  Diagnosis Date  . Breast CA ([place])    left  . 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 Cough    CHRONIC  . Diabetes ([place])   . GERD (gastroesophageal reflux disease)   . HLD (hyperlipidemia)   . Hypertension    NO MEDS  . Personal history of radiation therapy   . Pneumonia    SMALL SPOT ON LUNG, HX RADIATION PNEUMONITIS   . PONV (postoperative nausea and vomiting)    after hysterectomy  . RA (rheumatoid arthritis) ([place])   . 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 Wears dentures    partial upper    Past Surgical History:  Procedure Laterality Date  . ABDOMINAL HYSTERECTOMY  1991  . ANTERIOR CERVICAL DECOMP/DISCECTOMY FUSION N/A 03/16/2015   Procedure: ANTERIOR  CERVICAL DECOMPRESSION FUSION CERVICAL TWO-THREE,CERVICAL THREE-FOUR WITH REMOVAL OF HARDWARE AT CERVICAL FOUR-FIVE.;  Surgeon: Earnie Larsson, MD;  Location: [place];  Service: Neurosurgery;  Laterality: N/A;  . APPENDECTOMY    . BREAST BIOPSY Left 08/31/2013   positive  . BREAST LUMPECTOMY Left 10/06/2013 & 11/03/2013  . CARPAL TUNNEL RELEASE Bilateral   . CATARACT EXTRACTION W/PHACO Left 05/13/2018   Procedure: CATARACT EXTRACTION PHACO AND INTRAOCULAR LENS PLACEMENT ([place]) LEFT;  Surgeon: Birder Robson, MD;  Location: [facility];   Service: Ophthalmology;  Laterality: Left;  [place]  00:32 CDE 3.76 Fluid pack lot # [id number]  . CATARACT EXTRACTION W/PHACO Right 08/05/2018   Procedure: CATARACT EXTRACTION PHACO AND INTRAOCULAR LENS PLACEMENT ([facility]) RIGHT;  Surgeon: Birder Robson, MD;  Location: [facility];  Service: Ophthalmology;  Laterality: Right;  PT WOULD LIKE TO BE FIRST CASE Diabetic - oal meds  . neck fusion    . NECK SURGERY  10/2016  . REPLACEMENT TOTAL KNEE BILATERAL     x2  . right shoulder replacement Right   . ROTATOR CUFF REPAIR Right     There were no vitals filed for this visit.  Subjective Assessment - 12/21/18 2000    Subjective   Patient reports increased pain in her hands in the last few months with gradual worsening.  She has splints to sleep in at night.  Pain tends to be worse at night but better with splint wear.    Patient Stated Goals  Patient reports she would like to decrease pain in her hands, swelling and be able to use them better for daily tasks.    Currently in Pain?  Yes    Pain Score  5     Pain Location  Hand    Pain Orientation  Right;Left    Pain Descriptors / Indicators  Aching    Pain Onset  More than a month ago    Pain Frequency  Intermittent    Multiple Pain Sites  No        [facility] OT Assessment - 12/21/18 2009      Assessment   Medical Diagnosis  arthritis    Referring Provider (OT)  kernodle    Hand Dominance  Right    Prior Therapy  hand therapy in the past      Balance Screen   Has the patient fallen in the past 6 months  Yes    How many times?  3    Has the patient had a decrease in activity level because of a fear of falling?   No    Is the patient reluctant to leave their home because of a fear of falling?   No      Home  Environment   Family/patient expects to be discharged to:  Private residence    Living Arrangements  Spouse/significant other    Available Help at Discharge  Family    Type of [place]  One level    Alternate Level Stairs - Number of Steps  --   8-9 to enter   [employer];Door    Educational psychologist      Prior Function   Level of Independence  Independent    Vocation  Retired    Leisure  piano, reading, walking      ADL   ADL comments  Patient has difficulty with opening jars and containers, carrying objects, lifting pots and pans, peeling vegetables,.  She does eat a lot of raw veggies.  Patient is independent with basic self care tasks.  She has trouble with IADLs such as laundry, cooking, shopping and getting items from the back of the cabinet.        Written Expression   Dominant Hand  Right      Vision - History   Baseline Vision  Wears glasses only for reading      Sensation   Additional Comments  Some numbness and tingling in the hands.       AROM   Right Shoulder Flexion  90 Degrees    Left Shoulder Flexion  130 Degrees    Right Forearm Pronation  80 Degrees    Right Forearm Supination  60 Degrees    Left Forearm Pronation  80 Degrees    Left Forearm Supination  75 Degrees    Right Wrist Extension  50 Degrees    Right Wrist Flexion  70 Degrees    Left Wrist Extension  50 Degrees    Left Wrist Flexion  60 Degrees      Right Hand AROM   R Index  MCP 0-90  -5 Degrees   80 flexion   R Index PIP 0-100  -5 Degrees   80   R Long  MCP 0-90  0 Degrees   80   R Long PIP 0-100  -15 Degrees   85   R Ring  MCP 0-90  0 Degrees   85   R Ring PIP 0-100  -20 Degrees   85   R Little  MCP 0-90  0 Degrees   85   R Little PIP 0-100  -25 Degrees   85     Left Hand AROM   L Index  MCP 0-90  0 Degrees   80   L Index PIP 0-100  -10 Degrees   85   L Index DIP 0-70  --   85   L Long  MCP 0-90  0 Degrees   85   L Long PIP 0-100  -10 Degrees   85   L Ring  MCP 0-90  0 Degrees   85   L Ring PIP 0-100  -10 Degrees   85   L Little  MCP 0-90  0 Degrees   80   L Little PIP  0-100  -10 Degrees   85     Hand Function   Right Hand Grip (lbs)  35    Right Hand Lateral Pinch  7 lbs    Right Hand 3 Point Pinch  4 lbs    Left Hand Grip (lbs)  23    Left Hand Lateral Pinch  7 lbs    Left 3 point pinch  5 lbs      tightness noted in PIP joints bilaterally for extension. Edema in bilaterally MCP index right thumb edema more than left.   Patient has a history of right rotator cuff issues and had a reverse total shoulder replacement about 2 years ago.  Pt has a history of carpal tunnel with bilateral surgical intervention about 8 years ago.   Had foot drop in the past but is better  Right hand full fisting and opposition of thumb to ring finger but not small finger.  Left hand full fisting, full opposition but slow to move to small finger.   Patient seen for contrast for 11 mins to decrease edema and increase motion Educated on use of contrast as a part of home program.   Patient instructed on tendon gliding exercises with handout Oppositional movements for home program.                 OT Education - 12/21/18 2001    Education Details  plan of care, goals, role of OT, contrast    Person(s) Educated  Patient    Methods  Explanation    Comprehension  Verbalized understanding          OT Long Term Goals - 12/21/18 2028      OT LONG TERM GOAL #1   Title  Pt to be independent in HEP to decrease pain and edema and increase AROM in biltateral hands    Baseline  no current program    Time  6    Period  Weeks    Status  New    Target Date  01/27/19      OT LONG TERM GOAL #2   Title  Pt demo and verbalize 3 joint protection and AE to increase ease and decrease pain in ADL's and IADl's    Baseline  no current knowledge    Time  3    Period  Weeks    Status  New    Target Date  01/05/19      OT LONG TERM GOAL #3   Title  Patient will demonstrate understanding of alternative ways to open jars and containers to protect joints.    Baseline   difficulty opening jars and containers at eval    Time  6    Period  Weeks    Status  New    Target Date  01/26/19      OT LONG TERM GOAL #4   Title  Patient will demonstrate  understanding of adaptive equipment for use in the kitchen for peeling, cutting veggies.    Baseline  none    Time  6    Period  Weeks    Status  New    Target Date  01/26/19            Plan - 12/21/18 2006    Clinical Impression Statement  Patient is a 78 yo female with arthritic changes in hands, pain and referred to OT for evaluation and treatment.  Patient presents with bilateral hand pain, decreased ROM, decreased strength, decreased ability to perform IADL tasks, edema present.  Patient would benefit from skilled OT intervention to maximize safety and independence in daily tasks.    OT Occupational Profile and History  Detailed Assessment- Review of Records and additional review of physical, cognitive, psychosocial history related to current functional performance    Occupational performance deficits (Please refer to evaluation for details):  ADL's;IADL's;Social Participation    Body Structure / Function / Physical Skills  ADL;Coordination;UE functional use;Flexibility;IADL;Pain;Dexterity;FMC;Strength;Edema;ROM    Rehab Potential  Good    Clinical Decision Making  Limited treatment options, no task modification necessary    Comorbidities Affecting Occupational Performance:  May have comorbidities impacting occupational performance    Modification or Assistance to Complete Evaluation   No modification of tasks or assist necessary to complete eval    OT Frequency  1x / week    OT Duration  6 weeks    OT Treatment/Interventions  Self-care/ADL training;Cryotherapy;Therapeutic exercise;DME and/or AE instruction;Fluidtherapy;Neuromuscular education;Manual Therapy;Splinting;Moist Heat;Contrast Bath;Passive range of motion;Therapeutic activities;Patient/family education    Consulted and Agree with Plan of Care   Patient       Patient will benefit from skilled therapeutic intervention in order to improve the following deficits and impairments:   Body Structure / Function / Physical Skills: ADL, Coordination, UE functional use, Flexibility, IADL, Pain, Dexterity, FMC, Strength, Edema, ROM       Visit Diagnosis: Pain in left hand  Pain in right hand  Muscle weakness (generalized)  Other lack of coordination    Problem List Patient Active Problem List   Diagnosis Date Noted  . Cervical radiculitis 12/03/2018  . Effusion of joint of multiple sites 12/03/2018  . Stiff neck 12/03/2018  . Cervical spondylosis without myelopathy 05/14/2017  . Contusion of right hip region 03/14/2017  . Contusion of right knee 03/14/2017  . Shoulder pain 03/14/2017  . Complete tear of right rotator cuff 01/02/2017  . Bursitis of right hip 08/17/2016  . Impingement syndrome of shoulder region 03/01/2016  . RUL opacity 01/17/2016  . B12 deficiency 06/27/2015  . Pneumonitis 05/16/2015  . PNA (pneumonia) 05/16/2015  . Cervical stenosis of spinal canal 03/16/2015  . Cervical spinal stenosis 03/16/2015  . Acute URI 03/10/2015  . URI, acute 02/16/2015  . Type 2 diabetes mellitus ([place]) 12/25/2014  . Neuropathy involving both lower extremities 09/28/2014  . Upper airway cough syndrome 09/16/2014  . 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 Lung consolidation ([place]) 05/25/2014  . Cough 05/25/2014  . Breast cancer in situ 03/23/2014  . 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 Hyperlipidemia 05/04/2013  . HTN (hypertension) 05/04/2013    T , OTR/L, CLT  , 12/21/2018, 8:32 PM  [place] [facility] [address], [place], [address] Phone: [phone]   Fax:  [phone]  Name: AYOMIKUN LOYAL MRN: [id number] Date of Birth: 1940/08/01

## 2018-12-21 NOTE — Addendum Note (Signed)
Addended by: Garlon Hatchet T on: 12/21/2018 08:34 PM   Modules accepted: Orders

## 2018-12-23 ENCOUNTER — Ambulatory Visit: Payer: Medicare Other | Attending: Rheumatology | Admitting: Occupational Therapy

## 2018-12-23 ENCOUNTER — Other Ambulatory Visit: Payer: Self-pay

## 2018-12-23 DIAGNOSIS — M6281 Muscle weakness (generalized): Secondary | ICD-10-CM | POA: Insufficient documentation

## 2018-12-23 DIAGNOSIS — M79642 Pain in left hand: Secondary | ICD-10-CM

## 2018-12-23 DIAGNOSIS — R278 Other lack of coordination: Secondary | ICD-10-CM | POA: Diagnosis present

## 2018-12-23 DIAGNOSIS — M79641 Pain in right hand: Secondary | ICD-10-CM | POA: Diagnosis not present

## 2018-12-23 NOTE — Therapy (Signed)
[place] [facility] [address] [place], [address] Phone: [phone]   Fax:  [phone]  Occupational Therapy Treatment  Patient Details  Name: Ashley Bray MRN: [id number] Date of Birth: 1941/02/22 Referring Provider (OT): Jefm Bryant   Encounter Date: 12/23/2018  OT End of Session - 12/23/18 1248    Visit Number  2    Number of Visits  6    Date for OT Re-Evaluation  01/27/19    OT Start Time  1133    OT Stop Time  1230    OT Time Calculation (min)  57 min    Activity Tolerance  Patient tolerated treatment well    Behavior During Therapy  [facility] for tasks assessed/performed       Past Medical History:  Diagnosis Date  . Breast CA ([place])    left  . 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 Cough    CHRONIC  . Diabetes ([place])   . GERD (gastroesophageal reflux disease)   . HLD (hyperlipidemia)   . Hypertension    NO MEDS  . Personal history of radiation therapy   . Pneumonia    SMALL SPOT ON LUNG, HX RADIATION PNEUMONITIS   . PONV (postoperative nausea and vomiting)    after hysterectomy  . RA (rheumatoid arthritis) ([place])   . 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 Wears dentures    partial upper    Past Surgical History:  Procedure Laterality Date  . ABDOMINAL HYSTERECTOMY  1991  . ANTERIOR CERVICAL DECOMP/DISCECTOMY FUSION N/A 03/16/2015   Procedure: ANTERIOR  CERVICAL DECOMPRESSION FUSION CERVICAL TWO-THREE,CERVICAL THREE-FOUR WITH REMOVAL OF HARDWARE AT CERVICAL FOUR-FIVE.;  Surgeon: Earnie Larsson, MD;  Location: [facility];  Service: Neurosurgery;  Laterality: N/A;  . APPENDECTOMY    . BREAST BIOPSY Left 08/31/2013   positive  . BREAST LUMPECTOMY Left 10/06/2013 & 11/03/2013  . CARPAL TUNNEL RELEASE Bilateral   . CATARACT EXTRACTION W/PHACO Left 05/13/2018   Procedure: CATARACT EXTRACTION PHACO AND INTRAOCULAR LENS PLACEMENT ([place]) LEFT;  Surgeon: Birder Robson, MD;  Location: [facility];  Service: Ophthalmology;  Laterality: Left;  [place]  00:32 CDE 3.76 Fluid pack lot # [id number]  . CATARACT EXTRACTION W/PHACO Right 08/05/2018   Procedure: CATARACT EXTRACTION PHACO AND INTRAOCULAR LENS PLACEMENT ([facility]) RIGHT;  Surgeon: Birder Robson, MD;  Location: [facility];  Service: Ophthalmology;  Laterality: Right;  PT WOULD LIKE TO BE FIRST CASE Diabetic - oal meds  . neck fusion    . NECK SURGERY  10/2016  . REPLACEMENT TOTAL KNEE BILATERAL     x2  . right shoulder replacement Right   . ROTATOR CUFF REPAIR Right     There were no vitals filed for this visit.      [facility] OT Assessment - 12/23/18 0001      Right Hand AROM   R Index  MCP 0-90  80 Degrees    R Index PIP 0-100  100 Degrees    R Long  MCP 0-90  90 Degrees    R Long PIP 0-100  100 Degrees    R Ring  MCP 0-90  90 Degrees    R Ring PIP 0-100  100 Degrees    R Little  MCP 0-90  90 Degrees    R Little PIP 0-100  90 Degrees      Left Hand AROM   L Index  MCP 0-90  80 Degrees    L Index PIP 0-100  100 Degrees    L Long  MCP 0-90  90 Degrees    L Long PIP 0-100  100 Degrees    L Ring  MCP 0-90  90 Degrees    L Ring PIP 0-100  100 Degrees    L Little  MCP 0-90  90 Degrees    L Little PIP 0-100  95 Degrees      Hand Function   Right Hand Grip (lbs)  35    Right Hand Lateral Pinch  10 lbs    Right Hand 3 Point Pinch  5 lbs    Left Hand Grip (lbs)  30    Left Hand Lateral Pinch  9 lbs    Left 3 point pinch  6 lbs       Measurements taken - AROM in bilateral hands WNL - but increase pain during day or night time  Denies any numbness or pins and needles  Pain and stiffness in am  Ed pt on when to use Paraffin and when contrast         OT Treatments/Exercises (OP) - 12/23/18 0001      RUE Paraffin   Number Minutes Paraffin  10 Minutes    RUE Paraffin Location  Hand    Comments  decrease pain and stiffness       LUE Paraffin   Number Minutes Paraffin  10  Minutes    LUE Paraffin Location  Hand    Comments  decreas pain and stiffness       Review with pt Tendon glides- do not force - AROM Opposition to all digits 5-10 reps  Pain free RD of digits add - 5 reps     And review joint protection principles  And AE trng hand out provided  And demo some of task and AE     OT Education - 12/23/18 1248    Education Details  homeprogram updated and review    Person(s) Educated  Patient    Methods  Explanation;Tactile cues;Demonstration;Verbal cues;Handout    Comprehension  Verbalized understanding;Returned demonstration          OT Long Term Goals - 12/21/18 2028      OT LONG TERM GOAL #1   Title  Pt to be independent in HEP to decrease pain and edema and increase AROM in biltateral hands    Baseline  no current program    Time  6    Period  Weeks    Status  New    Target Date  01/27/19      OT LONG TERM GOAL #2   Title  Pt demo and verbalize 3 joint protection and AE to increase ease and decrease pain in ADL's and IADl's    Baseline  no current knowledge    Time  3    Period  Weeks    Status  New    Target Date  01/05/19      OT LONG TERM GOAL #3   Title  Patient will demonstrate understanding of alternative ways to open jars and containers to protect joints.    Baseline  difficulty opening jars and containers at eval    Time  6    Period  Weeks    Status  New    Target Date  01/26/19      OT LONG TERM GOAL #4   Title  Patient will demonstrate understanding of  adaptive equipment for use in the kitchen for peeling, cutting veggies.    Baseline  none    Time  6    Period  Weeks    Status  New    Target Date  01/26/19            Plan - 12/23/18 1249    Clinical Impression Statement  Pt arrrive this date with report of decrease pain this date - but last night was 6-7/10 - pt show increase AROM in L more than R - and increase grip strength in L - increase prehension strength compare to evaluation - review again  Homeprogram and updated -add and review joint protection and Ae trng too    OT Occupational Profile and History  Detailed Assessment- Review of Records and additional review of physical, cognitive, psychosocial history related to current functional performance    Occupational performance deficits (Please refer to evaluation for details):  ADL's;IADL's;Social Participation    Body Structure / Function / Physical Skills  ADL;Coordination;UE functional use;Flexibility;IADL;Pain;Dexterity;FMC;Strength;Edema;ROM    Rehab Potential  Good    Clinical Decision Making  Limited treatment options, no task modification necessary    Comorbidities Affecting Occupational Performance:  May have comorbidities impacting occupational performance    Modification or Assistance to Complete Evaluation   No modification of tasks or assist necessary to complete eval    OT Frequency  1x / week    OT Duration  6 weeks    OT Treatment/Interventions  Self-care/ADL training;Cryotherapy;Therapeutic exercise;DME and/or AE instruction;Fluidtherapy;Neuromuscular education;Manual Therapy;Splinting;Moist Heat;Contrast Bath;Passive range of motion;Therapeutic activities;Patient/family education    Plan  review progress with HEP    Consulted and Agree with Plan of Care  Patient       Patient will benefit from skilled therapeutic intervention in order to improve the following deficits and impairments:   Body Structure / Function / Physical Skills: ADL, Coordination, UE functional use, Flexibility, IADL, Pain, Dexterity, FMC, Strength, Edema, ROM       Visit Diagnosis: Pain in right hand  Pain in left hand  Muscle weakness (generalized)  Other lack of coordination    Problem List Patient Active Problem List   Diagnosis Date Noted  . Cervical radiculitis 12/03/2018  . Effusion of joint of multiple sites 12/03/2018  . Stiff neck 12/03/2018  . Cervical spondylosis without myelopathy 05/14/2017  . Contusion of right hip  region 03/14/2017  . Contusion of right knee 03/14/2017  . Shoulder pain 03/14/2017  . Complete tear of right rotator cuff 01/02/2017  . Bursitis of right hip 08/17/2016  . Impingement syndrome of shoulder region 03/01/2016  . RUL opacity 01/17/2016  . B12 deficiency 06/27/2015  . Pneumonitis 05/16/2015  . PNA (pneumonia) 05/16/2015  . Cervical stenosis of spinal canal 03/16/2015  . Cervical spinal stenosis 03/16/2015  . Acute URI 03/10/2015  . URI, acute 02/16/2015  . Type 2 diabetes mellitus ([place]) 12/25/2014  . Neuropathy involving both lower extremities 09/28/2014  . Upper airway cough syndrome 09/16/2014  . 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 Lung consolidation ([place]) 05/25/2014  . Cough 05/25/2014  . Breast cancer in situ 03/23/2014  . 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 Hyperlipidemia 05/04/2013  . HTN (hypertension) 05/04/2013    Rosalyn Gess OTR/L,CLT 12/23/2018, 12:52 PM  [facility] [address] [place], [address] Phone: [phone]   Fax:  [phone]  Name: Ashley Bray MRN: [id number] Date of  Birth: [birth year]

## 2018-12-23 NOTE — Patient Instructions (Signed)
Pt to cont with paraffin or contrast - use depending on symptoms Tendon glides- do not force - AROM Opposition to all digits 5-10 reps  Pain free RD of digits add - 5 reps   Joint protection principles  And AE trng done and hand out provided

## 2018-12-26 IMAGING — MR MR SHOULDER*R* W/O CM
5 series · 40 of 40 positions shown · non-contrast
Comparison: None.

CLINICAL DATA: Right shoulder and upper extremity pain with motion
for 2 months, worsening. No known injury. History of prior surgery.

EXAM:
MRI OF THE RIGHT SHOULDER WITHOUT CONTRAST
TECHNIQUE: Multiplanar, multisequence MR imaging of the shoulder was performed.
No intravenous contrast was administered.

[Series 3: T2 fat-sat · axial · 4.0mm · 0.47mm/px · z∈[-33,+71]mm · 10 of 25 slices shown (1 of 3)]
[im 1/25]
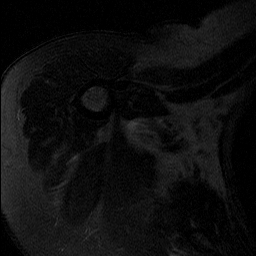
[im 3/25]
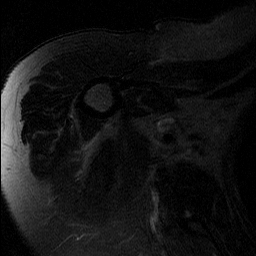
[im 6/25]
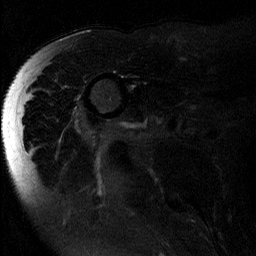
[im 9/25]
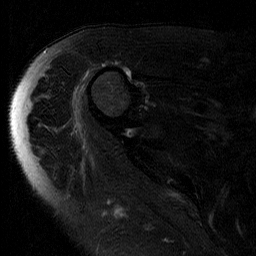
[im 11/25]
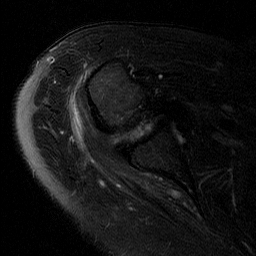
[im 14/25]
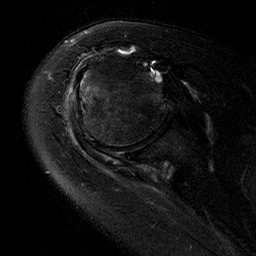
[im 17/25]
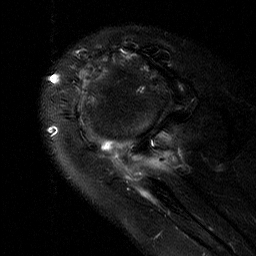
[im 19/25]
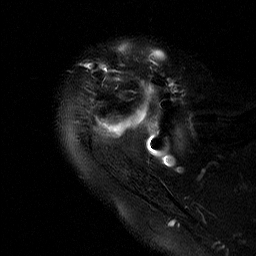
[im 22/25]
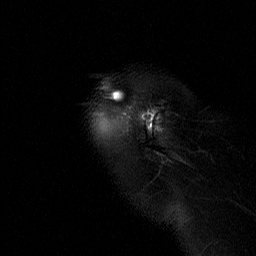
[im 25/25]
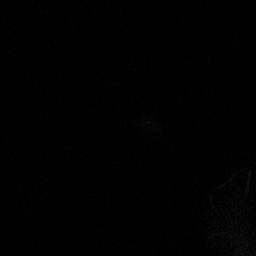

[Series 4: T2 fat-sat · oblique · 4.0mm · 0.62mm/px · 8 of 23 slices shown (2 of 3)]
[im 1/23]
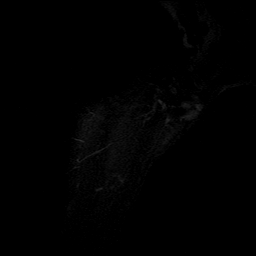
[im 4/23]
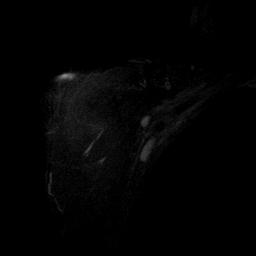
[im 7/23]
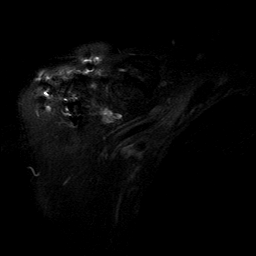
[im 10/23]
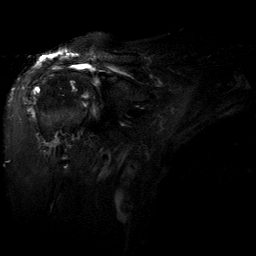
[im 13/23]
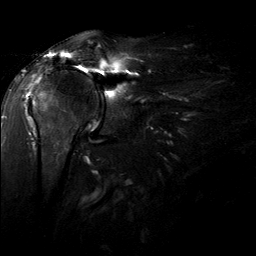
[im 16/23]
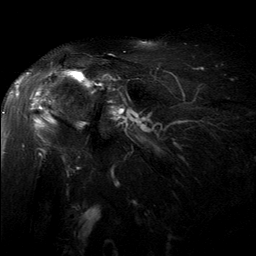
[im 19/23]
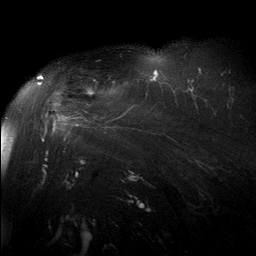
[im 23/23]
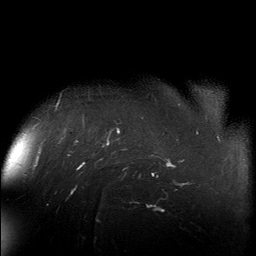

[Series 5: PD · oblique · 4.0mm · 0.62mm/px · 8 of 23 slices shown]
[im 1/23]
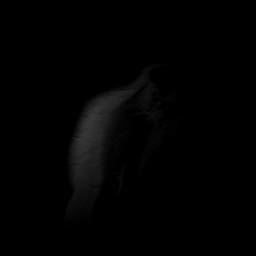
[im 4/23]
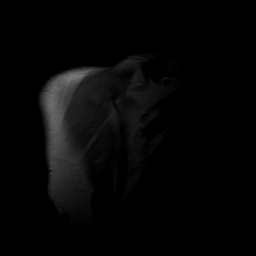
[im 7/23]
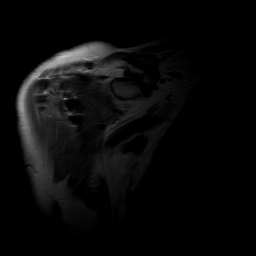
[im 10/23]
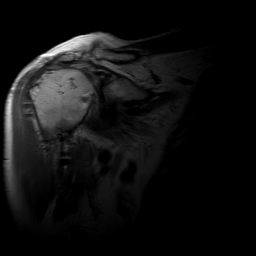
[im 13/23]
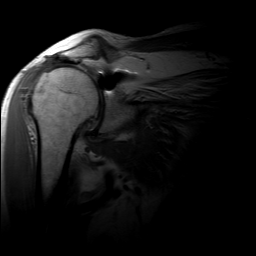
[im 16/23]
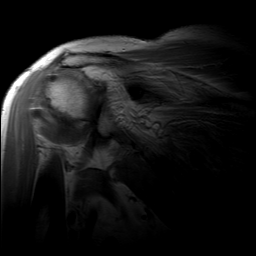
[im 19/23]
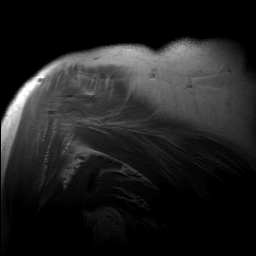
[im 23/23]
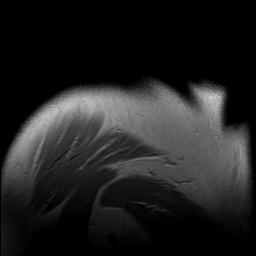

[Series 6: T1 · oblique · 4.0mm · 0.62mm/px · 7 of 20 slices shown]
[im 1/20]
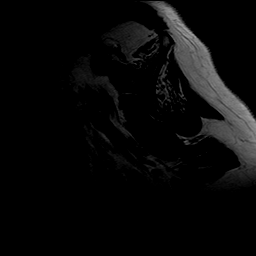
[im 4/20]
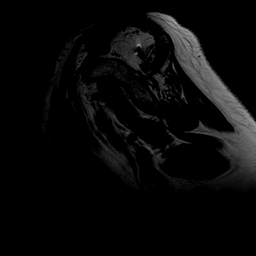
[im 7/20]
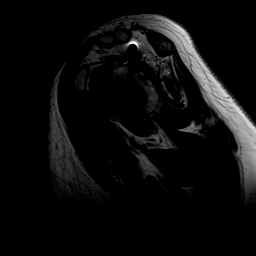
[im 10/20]
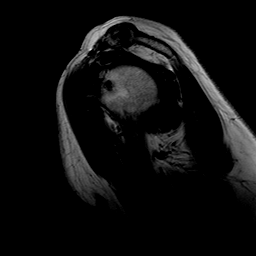
[im 13/20]
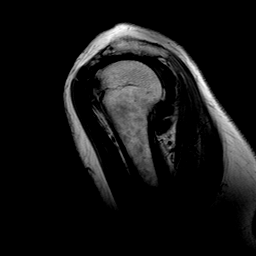
[im 16/20]
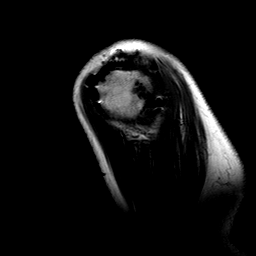
[im 20/20]
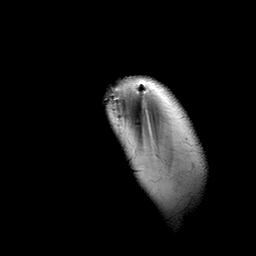

[Series 7: T2 fat-sat · oblique · 4.0mm · 0.62mm/px · 7 of 20 slices shown (3 of 3)]
[im 1/20]
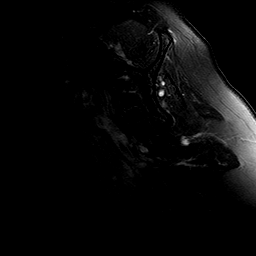
[im 4/20]
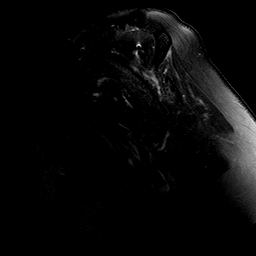
[im 7/20]
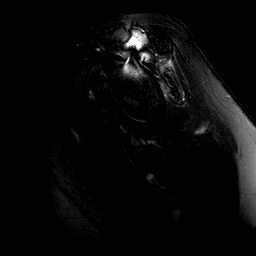
[im 10/20]
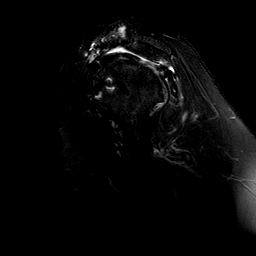
[im 13/20]
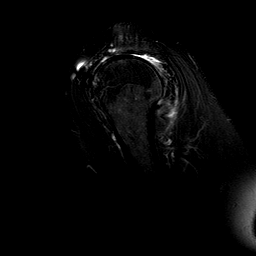
[im 16/20]
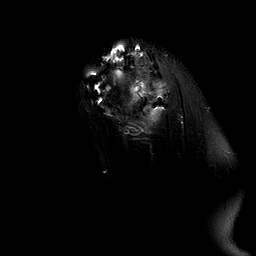
[im 20/20]
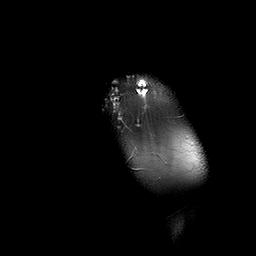

[40 of 40 positions shown; findings below may reference images not displayed]

FINDINGS: Rotator cuff: The patient has a complete supraspinatus tendon tear
with retraction almost to the glenoid, 3.5-4.5 cm. There is also a
large full-thickness tear of the supraspinatus measuring
approximately 1 cm AP with retraction of 3.5-4.5 cm. The rotator
cuff is otherwise intact.

Muscles: Marked atrophy of the supraspinatus and infraspinatus is
identified.

Biceps long head: The tendon is completely torn in the bicipital
interval.

Acromioclavicular Joint: Moderate degenerative change is seen. Type
2 acromion. Large subacromial spur is identified. There is some
fluid in the subacromial/subdeltoid bursa. Susceptibility artifact
about the acromion somewhat limits evaluation.

Glenohumeral Joint: Moderate degenerative change is seen with
cartilage loss and an osteophyte off the humeral head.

Labrum:  Appears intact.

Bones:  No fracture or worrisome lesion.

Other: None.
IMPRESSION: Complete supraspinatus tear and full-thickness tear of approximately
the anterior 1 cm of the infraspinatus with retraction of both
tendons of 3.5-4.5 cm and atrophy of both muscle bellies.

Complete tear of the long head of biceps in the bicipital interval.

Moderate acromioclavicular osteoarthritis. Type 2 acromion with
prominent subacromial spurring also noted.

Moderate glenohumeral osteoarthritis.

## 2019-01-01 ENCOUNTER — Other Ambulatory Visit: Payer: Self-pay

## 2019-01-01 ENCOUNTER — Ambulatory Visit: Payer: Medicare Other | Admitting: Occupational Therapy

## 2019-01-01 DIAGNOSIS — M79641 Pain in right hand: Secondary | ICD-10-CM

## 2019-01-01 DIAGNOSIS — M79642 Pain in left hand: Secondary | ICD-10-CM

## 2019-01-01 DIAGNOSIS — R278 Other lack of coordination: Secondary | ICD-10-CM

## 2019-01-01 DIAGNOSIS — M6281 Muscle weakness (generalized): Secondary | ICD-10-CM

## 2019-01-01 NOTE — Patient Instructions (Signed)
Cont with paraffin  Tendon glides  Opposition  RD of digits  1 x day  10 reps pain free

## 2019-01-01 NOTE — Therapy (Signed)
[facility] [address] [place], [address] Phone: [phone]   Fax:  [phone]  Occupational Therapy Treatment  Patient Details  Name: Ashley Bray MRN: [id number] Date of Birth: 1940/11/04 Referring Provider (OT): Jefm Bryant   Encounter Date: 01/01/2019  OT End of Session - 01/01/19 1116    Visit Number  3    Number of Visits  6    Date for OT Re-Evaluation  01/27/19    OT Start Time  1034    OT Stop Time  1105    OT Time Calculation (min)  31 min    Activity Tolerance  Patient tolerated treatment well    Behavior During Therapy  [facility] for tasks assessed/performed       Past Medical History:  Diagnosis Date  . Breast CA ([place])    left  . 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 Cough    CHRONIC  . Diabetes ([place])   . GERD (gastroesophageal reflux disease)   . HLD (hyperlipidemia)   . Hypertension    NO MEDS  . Personal history of radiation therapy   . Pneumonia    SMALL SPOT ON LUNG, HX RADIATION PNEUMONITIS   . PONV (postoperative nausea and vomiting)    after hysterectomy  . RA (rheumatoid arthritis) ([place])   . 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 Wears dentures    partial upper    Past Surgical History:  Procedure Laterality Date  . ABDOMINAL HYSTERECTOMY  1991  . ANTERIOR CERVICAL DECOMP/DISCECTOMY FUSION N/A 03/16/2015   Procedure: ANTERIOR  CERVICAL DECOMPRESSION FUSION CERVICAL TWO-THREE,CERVICAL THREE-FOUR WITH REMOVAL OF HARDWARE AT CERVICAL FOUR-FIVE.;  Surgeon: Earnie Larsson, MD;  Location: [place] [facility];  Service: Neurosurgery;  Laterality: N/A;  . APPENDECTOMY    . BREAST BIOPSY Left 08/31/2013   positive  . BREAST LUMPECTOMY Left 10/06/2013 & 11/03/2013  . CARPAL TUNNEL RELEASE Bilateral   . CATARACT EXTRACTION W/PHACO Left 05/13/2018   Procedure: CATARACT EXTRACTION PHACO AND INTRAOCULAR LENS PLACEMENT ([place]) LEFT;  Surgeon:  Birder Robson, MD;  Location: [facility] ORS;  Service: Ophthalmology;  Laterality: Left;  [place]  00:32 CDE 3.76 Fluid pack lot # [id number]  . CATARACT EXTRACTION W/PHACO Right 08/05/2018   Procedure: CATARACT EXTRACTION PHACO AND INTRAOCULAR LENS PLACEMENT ([facility]) RIGHT;  Surgeon: Birder Robson, MD;  Location: [place];  Service: Ophthalmology;  Laterality: Right;  PT WOULD LIKE TO BE FIRST CASE Diabetic - oal meds  . neck fusion    . NECK SURGERY  10/2016  . REPLACEMENT TOTAL KNEE BILATERAL     x2  . right shoulder replacement Right   . ROTATOR CUFF REPAIR Right     There were no vitals filed for this visit.  Subjective Assessment - 01/01/19 1110    Subjective   So my neck arthritis bothered me - went to see Dr Sabra Heck - and he put me on steriod and muscle relaxer - so I feel great at the moment - and my hands too - not swelling or pain - done paraffin for for tightness    Patient Stated Goals  Patient reports she would like to decrease pain in her hands, swelling and be able to use them better for daily tasks.    Currently in Pain?  No/denies         [facility] OT Assessment - 01/01/19 0001  AROM   AROM Assessment Site  Wrist      Strength   Right Hand Grip (lbs)  40    Left Hand Grip (lbs)  34      Right Hand AROM   R Index  MCP 0-90  80 Degrees    R Index PIP 0-100  100 Degrees    R Long  MCP 0-90  90 Degrees    R Long PIP 0-100  100 Degrees    R Ring  MCP 0-90  90 Degrees    R Ring PIP 0-100  100 Degrees    R Little  MCP 0-90  90 Degrees    R Little PIP 0-100  90 Degrees      Left Hand AROM   L Index  MCP 0-90  80 Degrees    L Index PIP 0-100  100 Degrees    L Long  MCP 0-90  90 Degrees    L Long PIP 0-100  100 Degrees    L Ring  MCP 0-90  90 Degrees    L Ring PIP 0-100  100 Degrees    L Little  MCP 0-90  90 Degrees    L Little PIP 0-100  95 Degrees      Hand Function   Right Hand Grip (lbs)  40    Right Hand Lateral Pinch  9 lbs    Right Hand 3 Point  Pinch  9 lbs    Left Hand Grip (lbs)  35    Left Hand Lateral Pinch  9 lbs    Left 3 point pinch  6 lbs       AROM in bilateral hands WFL  And opposition able to touch 5th  Grip increase as well as some of the prehension strength   Review with pt her HEP -and to cont with for 3 wks - will see Dr Jefm Bryant in 8 wks  And will be done in the next few days with her steroid   Tendon glides- do not force - AROM Opposition to all digits 5-10 reps  Pain free RD of digits add - 5 reps     And review joint protection principles  And AE trng hand out provided  And demo some of task and AE                  OT Education - 01/01/19 1115    Education Details  homeprogram updated and review    Person(s) Educated  Patient    Methods  Explanation;Tactile cues;Demonstration;Verbal cues;Handout    Comprehension  Verbalized understanding;Returned demonstration          OT Long Term Goals - 12/21/18 2028      OT LONG TERM GOAL #1   Title  Pt to be independent in HEP to decrease pain and edema and increase AROM in biltateral hands    Baseline  no current program    Time  6    Period  Weeks    Status  New    Target Date  01/27/19      OT LONG TERM GOAL #2   Title  Pt demo and verbalize 3 joint protection and AE to increase ease and decrease pain in ADL's and IADl's    Baseline  no current knowledge    Time  3    Period  Weeks    Status  New    Target Date  01/05/19      OT LONG  TERM GOAL #3   Title  Patient will demonstrate understanding of alternative ways to open jars and containers to protect joints.    Baseline  difficulty opening jars and containers at eval    Time  6    Period  Weeks    Status  New    Target Date  01/26/19      OT LONG TERM GOAL #4   Title  Patient will demonstrate understanding of adaptive equipment for use in the kitchen for peeling, cutting veggies.    Baseline  none    Time  6    Period  Weeks    Status  New    Target Date  01/26/19             Plan - 01/01/19 1116    Clinical Impression Statement  Pt arrive this date reporting she is on steriods and muscle relaxer for her neck. Her hands feels great , no pain and good motion - her AROM in digits WNL and grip and prehension increase from last time - pt finish meds in few days- pt to follow up one more time in 3 wks to see if she can maintain progress without steriods    OT Occupational Profile and History  Detailed Assessment- Review of Records and additional review of physical, cognitive, psychosocial history related to current functional performance    Occupational performance deficits (Please refer to evaluation for details):  ADL's;IADL's;Social Participation    Body Structure / Function / Physical Skills  ADL;Coordination;UE functional use;Flexibility;IADL;Pain;Dexterity;FMC;Strength;Edema;ROM    Rehab Potential  Good    Clinical Decision Making  Limited treatment options, no task modification necessary    Comorbidities Affecting Occupational Performance:  May have comorbidities impacting occupational performance    Modification or Assistance to Complete Evaluation   No modification of tasks or assist necessary to complete eval    OT Frequency  1x / week    OT Duration  6 weeks    OT Treatment/Interventions  Self-care/ADL training;Cryotherapy;Therapeutic exercise;DME and/or AE instruction;Fluidtherapy;Neuromuscular education;Manual Therapy;Splinting;Moist Heat;Contrast Bath;Passive range of motion;Therapeutic activities;Patient/family education    Plan  review progress with HEP    Consulted and Agree with Plan of Care  Patient       Patient will benefit from skilled therapeutic intervention in order to improve the following deficits and impairments:   Body Structure / Function / Physical Skills: ADL, Coordination, UE functional use, Flexibility, IADL, Pain, Dexterity, FMC, Strength, Edema, ROM       Visit Diagnosis: Pain in right hand  Pain in left  hand  Muscle weakness (generalized)  Other lack of coordination    Problem List Patient Active Problem List   Diagnosis Date Noted  . Cervical radiculitis 12/03/2018  . Effusion of joint of multiple sites 12/03/2018  . Stiff neck 12/03/2018  . Cervical spondylosis without myelopathy 05/14/2017  . Contusion of right hip region 03/14/2017  . Contusion of right knee 03/14/2017  . Shoulder pain 03/14/2017  . Complete tear of right rotator cuff 01/02/2017  . Bursitis of right hip 08/17/2016  . Impingement syndrome of shoulder region 03/01/2016  . RUL opacity 01/17/2016  . B12 deficiency 06/27/2015  . Pneumonitis 05/16/2015  . PNA (pneumonia) 05/16/2015  . Cervical stenosis of spinal canal 03/16/2015  . Cervical spinal stenosis 03/16/2015  . Acute URI 03/10/2015  . URI, acute 02/16/2015  . Type 2 diabetes mellitus ([place]) 12/25/2014  . Neuropathy involving both lower extremities 09/28/2014  . Upper airway cough syndrome 09/16/2014  .  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 Lung consolidation ([place]) 05/25/2014  . Cough 05/25/2014  . Breast cancer in situ 03/23/2014  . 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 Hyperlipidemia 05/04/2013  . HTN (hypertension) 05/04/2013    Rosalyn Gess OTR/L,CLT 01/01/2019, 11:20 AM  [facility] [address], [place], [address] Phone: [phone]   Fax:  [phone]  Name: Ashley Bray MRN: [id number] Date of Birth: [birth year]

## 2019-01-22 ENCOUNTER — Other Ambulatory Visit: Payer: Self-pay

## 2019-01-22 ENCOUNTER — Ambulatory Visit: Payer: Medicare Other | Attending: Rheumatology | Admitting: Occupational Therapy

## 2019-01-22 DIAGNOSIS — R278 Other lack of coordination: Secondary | ICD-10-CM | POA: Insufficient documentation

## 2019-01-22 DIAGNOSIS — M6281 Muscle weakness (generalized): Secondary | ICD-10-CM | POA: Diagnosis present

## 2019-01-22 DIAGNOSIS — M79642 Pain in left hand: Secondary | ICD-10-CM | POA: Diagnosis present

## 2019-01-22 DIAGNOSIS — M79641 Pain in right hand: Secondary | ICD-10-CM | POA: Diagnosis not present

## 2019-01-22 NOTE — Patient Instructions (Signed)
Cont same HEP -and joint protection

## 2019-01-22 NOTE — Therapy (Signed)
[facility] [address], [place], [address] Phone: [phone]   Fax:  [phone]  Occupational Therapy Treatment  Patient Details  Name: Ashley Bray MRN: [id number] Date of Birth: 12-09-1940 Referring Provider (OT): Jefm Bryant   Encounter Date: 01/22/2019  OT End of Session - 01/22/19 1102    Visit Number  4    Number of Visits  4    Date for OT Re-Evaluation  01/22/19    OT Start Time  1030    OT Stop Time  1045    OT Time Calculation (min)  15 min    Activity Tolerance  Patient tolerated treatment well    Behavior During Therapy  [facility] for tasks assessed/performed       Past Medical History:  Diagnosis Date  . Breast CA ([place])    left  . 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 Cough    CHRONIC  . Diabetes ([place])   . GERD (gastroesophageal reflux disease)   . HLD (hyperlipidemia)   . Hypertension    NO MEDS  . Personal history of radiation therapy   . Pneumonia    SMALL SPOT ON LUNG, HX RADIATION PNEUMONITIS   . PONV (postoperative nausea and vomiting)    after hysterectomy  . RA (rheumatoid arthritis) ([place])   . 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 Wears dentures    partial upper    Past Surgical History:  Procedure Laterality Date  . ABDOMINAL HYSTERECTOMY  1991  . ANTERIOR CERVICAL DECOMP/DISCECTOMY FUSION N/A 03/16/2015   Procedure: ANTERIOR  CERVICAL DECOMPRESSION FUSION CERVICAL TWO-THREE,CERVICAL THREE-FOUR WITH REMOVAL OF HARDWARE AT CERVICAL FOUR-FIVE.;  Surgeon: Earnie Larsson, MD;  Location: [place] [facility];  Service: Neurosurgery;  Laterality: N/A;  . APPENDECTOMY    . BREAST BIOPSY Left 08/31/2013   positive  . BREAST LUMPECTOMY Left 10/06/2013 & 11/03/2013  . CARPAL TUNNEL RELEASE Bilateral   . CATARACT EXTRACTION W/PHACO Left 05/13/2018   Procedure: CATARACT EXTRACTION PHACO AND INTRAOCULAR LENS PLACEMENT ([place]) LEFT;  Surgeon: Birder Robson, MD;  Location: [facility] ORS;  Service: Ophthalmology;  Laterality: Left;  [place]  00:32 CDE 3.76 Fluid pack lot # [id number]  . CATARACT EXTRACTION W/PHACO Right 08/05/2018   Procedure: CATARACT EXTRACTION PHACO AND INTRAOCULAR LENS PLACEMENT ([facility]) RIGHT;  Surgeon: Birder Robson, MD;  Location: [place];  Service: Ophthalmology;  Laterality: Right;  PT WOULD LIKE TO BE FIRST CASE Diabetic - oal meds  . neck fusion    . NECK SURGERY  10/2016  . REPLACEMENT TOTAL KNEE BILATERAL     x2  . right shoulder replacement Right   . ROTATOR CUFF REPAIR Right     There were no vitals filed for this visit.  Subjective Assessment - 01/22/19 1056    Subjective   I am doing okay- done with my steriod , and taking my other medication - if I don't take it I can tell - I have more pain - but doing okay    Patient Stated Goals  Patient reports she would like to decrease pain in her hands, swelling and be able to use them better for daily tasks.    Currently in Pain?  No/denies         [facility] OT Assessment - 01/22/19 0001      Right Hand AROM   R Index  MCP 0-90  90 Degrees  R Index PIP 0-100  100 Degrees    R Long  MCP 0-90  90 Degrees    R Long PIP 0-100  100 Degrees    R Ring  MCP 0-90  90 Degrees    R Ring PIP 0-100  100 Degrees    R Little  MCP 0-90  90 Degrees    R Little PIP 0-100  90 Degrees      Left Hand AROM   L Index  MCP 0-90  80 Degrees    L Index PIP 0-100  100 Degrees    L Long  MCP 0-90  90 Degrees    L Long PIP 0-100  100 Degrees    L Ring  MCP 0-90  90 Degrees    L Ring PIP 0-100  100 Degrees    L Little  MCP 0-90  90 Degrees    L Little PIP 0-100  95 Degrees      Hand Function   Right Hand Grip (lbs)  43    Right Hand Lateral Pinch  9 lbs    Right Hand 3 Point Pinch  8 lbs    Left Hand Grip (lbs)  36    Left Hand Lateral Pinch  9 lbs    Left 3 point pinch  5 lbs        Measurements taken - see flow sheet  ROM and strength WNL for pt  Pt is  independent in HEP and doing joint protection    Tendon glides- do not force - AROM Opposition to all digits 5-10 reps  Pain free RD of digits add - 5 reps               OT Education - 01/22/19 1101    Education Details  discharge instruction    Person(s) Educated  Patient    Methods  Explanation;Tactile cues;Demonstration;Verbal cues;Handout    Comprehension  Verbalized understanding;Returned demonstration          OT Long Term Goals - 01/22/19 1105      OT LONG TERM GOAL #1   Title  Pt to be independent in HEP to decrease pain and edema and increase AROM in biltateral hands    Status  Achieved      OT LONG TERM GOAL #2   Title  Pt demo and verbalize 3 joint protection and AE to increase ease and decrease pain in ADL's and IADl's    Status  Achieved      OT LONG TERM GOAL #3   Title  Patient will demonstrate understanding of alternative ways to open jars and containers to protect joints.    Status  Achieved      OT LONG TERM GOAL #4   Title  Patient will demonstrate understanding of adaptive equipment for use in the kitchen for peeling, cutting veggies.    Status  Achieved            Plan - 01/22/19 1102    Clinical Impression Statement  Pt doing very well - bilateral hands AROM  and grip and prehension  strenght are WNL - pt independence in HEP for modalities to decrease pain and stiffness, maintain ROM and strenght and joint protection - met all goals - discharge with homeprogram    OT Occupational Profile and History  Detailed Assessment- Review of Records and additional review of physical, cognitive, psychosocial history related to current functional performance    Occupational performance deficits (Please refer to evaluation for  details):  ADL's;IADL's;Social Participation    Body Structure / Function / Physical Skills  ADL;Coordination;UE functional use;Flexibility;IADL;Pain;Dexterity;FMC;Strength;Edema;ROM    Rehab Potential  Good    Clinical  Decision Making  Limited treatment options, no task modification necessary    Comorbidities Affecting Occupational Performance:  May have comorbidities impacting occupational performance    Modification or Assistance to Complete Evaluation   No modification of tasks or assist necessary to complete eval    OT Treatment/Interventions  Self-care/ADL training;Cryotherapy;Therapeutic exercise;DME and/or AE instruction;Fluidtherapy;Neuromuscular education;Manual Therapy;Splinting;Moist Heat;Contrast Bath;Passive range of motion;Therapeutic activities;Patient/family education    Plan  discharge instruction with HEP    Consulted and Agree with Plan of Care  Patient       Patient will benefit from skilled therapeutic intervention in order to improve the following deficits and impairments:   Body Structure / Function / Physical Skills: ADL, Coordination, UE functional use, Flexibility, IADL, Pain, Dexterity, FMC, Strength, Edema, ROM       Visit Diagnosis: Pain in right hand  Pain in left hand  Muscle weakness (generalized)  Other lack of coordination    Problem List Patient Active Problem List   Diagnosis Date Noted  . Cervical radiculitis 12/03/2018  . Effusion of joint of multiple sites 12/03/2018  . Stiff neck 12/03/2018  . Cervical spondylosis without myelopathy 05/14/2017  . Contusion of right hip region 03/14/2017  . Contusion of right knee 03/14/2017  . Shoulder pain 03/14/2017  . Complete tear of right rotator cuff 01/02/2017  . Bursitis of right hip 08/17/2016  . Impingement syndrome of shoulder region 03/01/2016  . RUL opacity 01/17/2016  . B12 deficiency 06/27/2015  . Pneumonitis 05/16/2015  . PNA (pneumonia) 05/16/2015  . Cervical stenosis of spinal canal 03/16/2015  . Cervical spinal stenosis 03/16/2015  . Acute URI 03/10/2015  . URI, acute 02/16/2015  . Type 2 diabetes mellitus ([place]) 12/25/2014  . Neuropathy involving both lower extremities 09/28/2014  . Upper  airway cough syndrome 09/16/2014  . 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 Lung consolidation ([place]) 05/25/2014  . Cough 05/25/2014  . Breast cancer in situ 03/23/2014  . 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 Hyperlipidemia 05/04/2013  . HTN (hypertension) 05/04/2013    Rosalyn Gess OTR/L,CLT 01/22/2019, 11:06 AM  [facility] [address], [place], [address] Phone: [phone]   Fax:  [phone]  Name: Ashley Bray MRN: [id number] Date of Birth: 1941-02-11

## 2019-02-01 IMAGING — CT CT CHEST W/O CM
2 of 3 series · 15 of 36 positions shown, 18 images · non-contrast
Comparison: 01/13/2016 chest CT.

CLINICAL DATA: 75-year-old female with a history of left breast
cancer treated with radiation therapy in 6921 with reported history
of radiation pneumonitis.

EXAM:
CT CHEST WITHOUT CONTRAST
TECHNIQUE: Multidetector CT imaging of the chest was performed following the
standard protocol without IV contrast.

[Series 2: thorax · axial · 0.61mm/px · z∈[-551,-311]mm · 12 of 142 slices shown, 15 images]
[im 11/142  mediastinal]
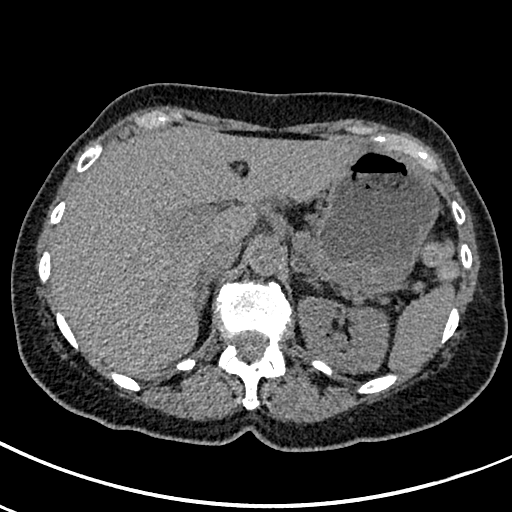
[im 11/142  lung]
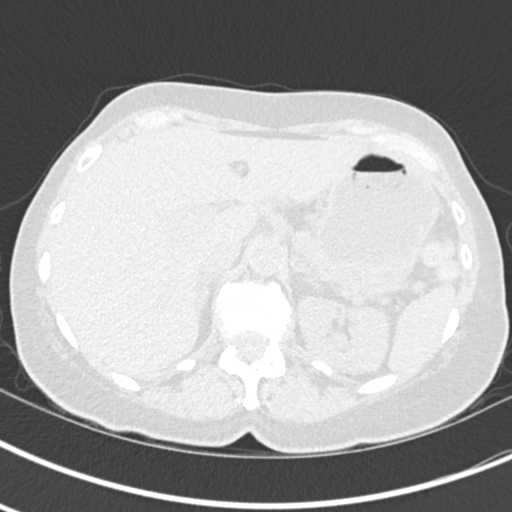
[im 21/142  lung]
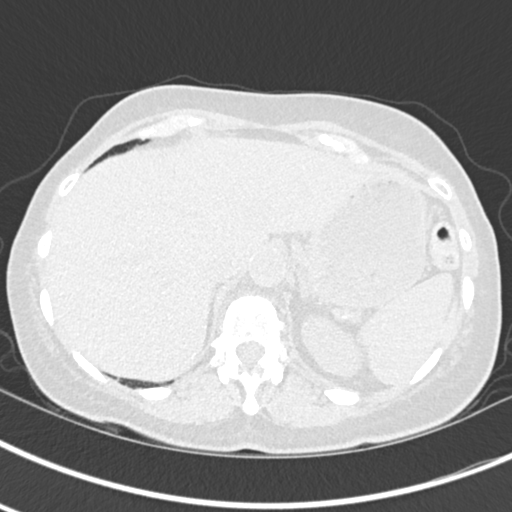
[im 32/142  lung]
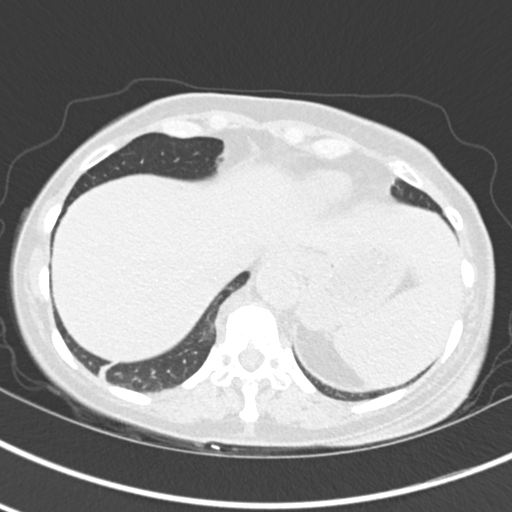
[im 42/142  lung]
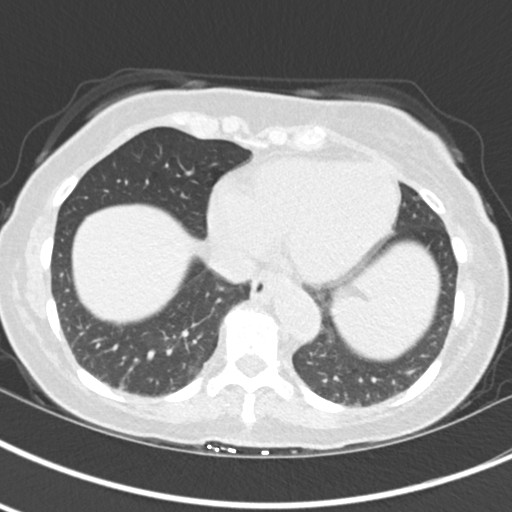
[im 53/142  mediastinal]
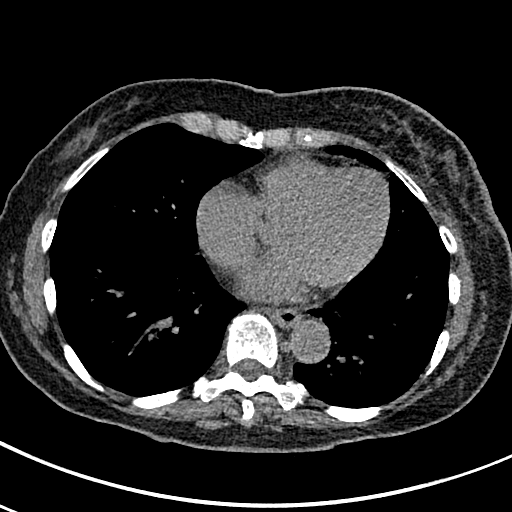
[im 53/142  lung]
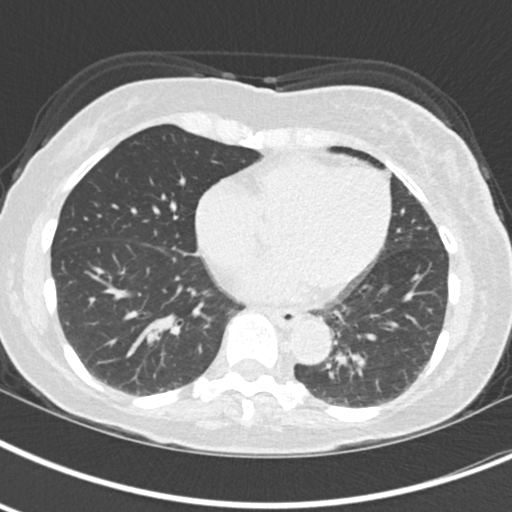
[im 63/142  lung]
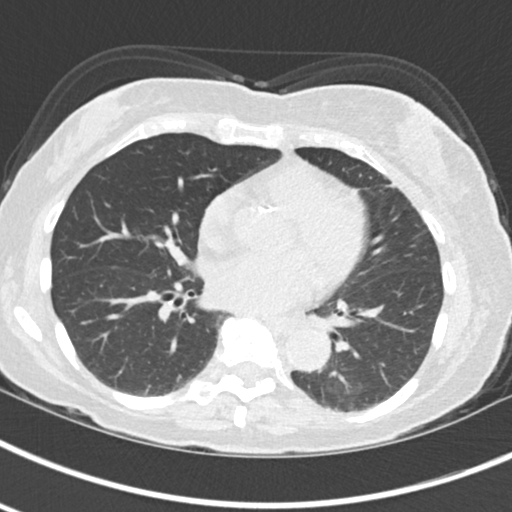
[im 79/142  lung]
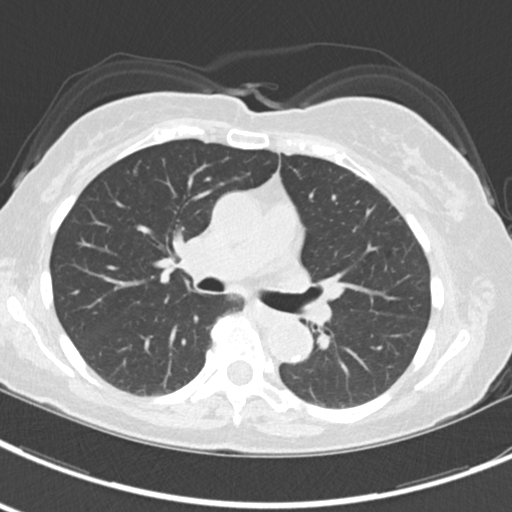
[im 89/142  lung]
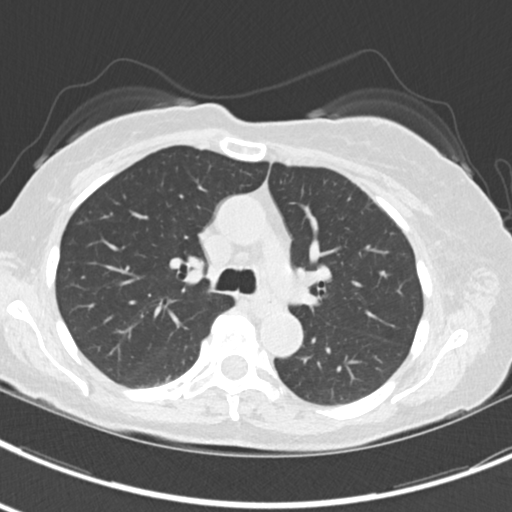
[im 100/142  mediastinal]
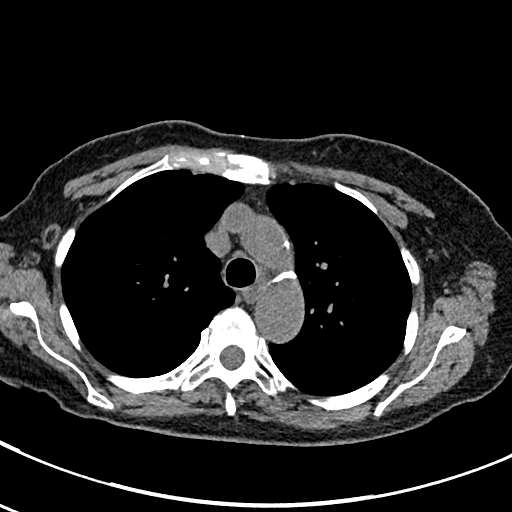
[im 100/142  lung]
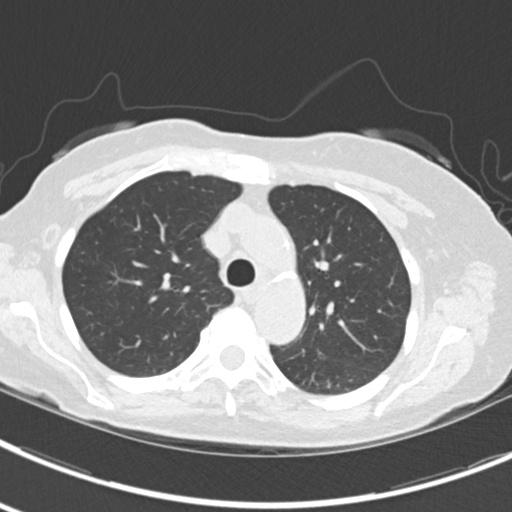
[im 110/142  lung]
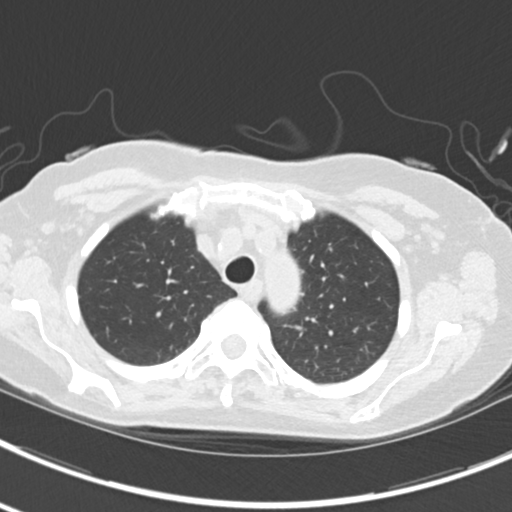
[im 121/142  lung]
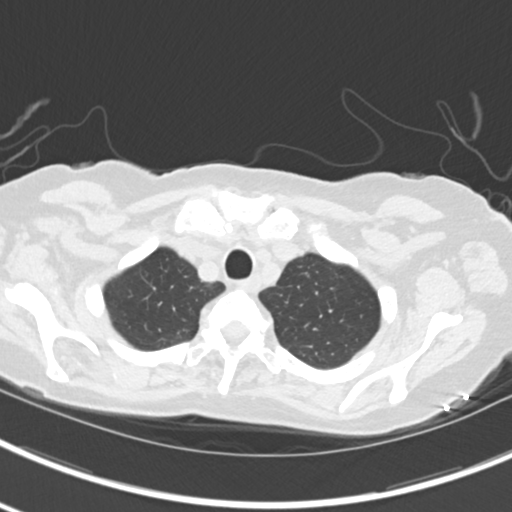
[im 131/142  lung]
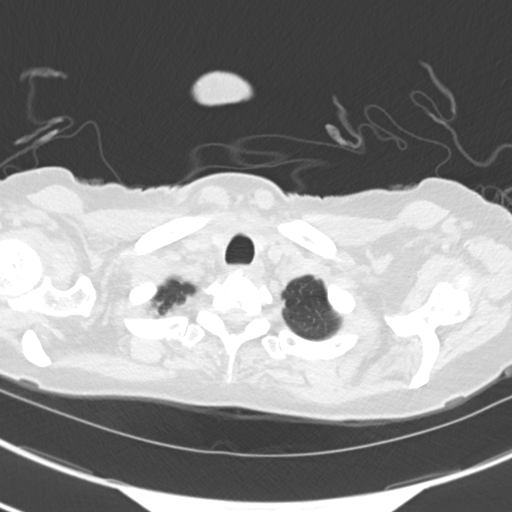

[Series 5: coronal · coronal · 0.56mm/px · 3 of 108 slices shown]
[im 22/108  lung]
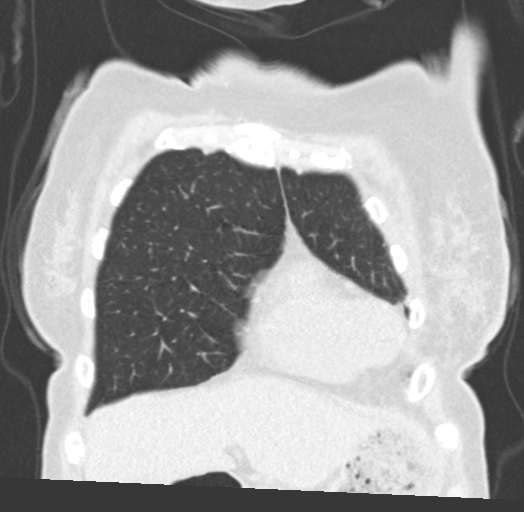
[im 43/108  lung]
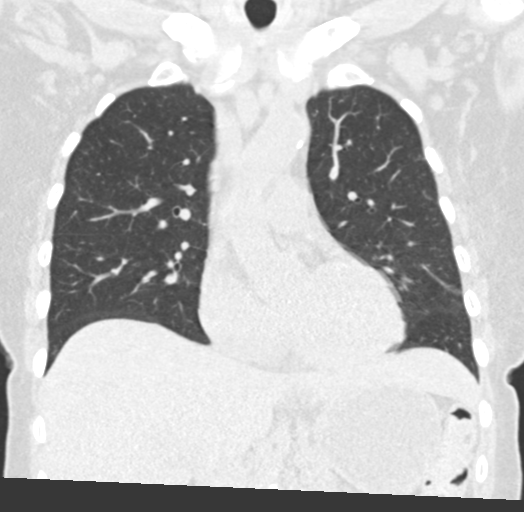
[im 65/108  lung]
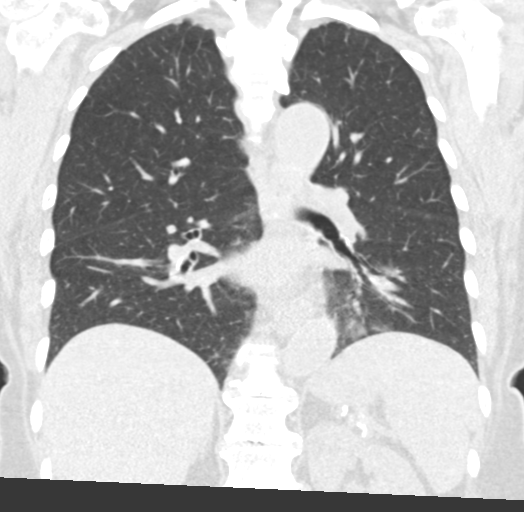

[15 of 36 positions shown; findings below may reference images not displayed]

FINDINGS: Cardiovascular: Normal heart size. No significant pericardial
fluid/thickening. Left anterior descending coronary atherosclerosis.
Atherosclerotic nonaneurysmal thoracic aorta. Normal caliber
pulmonary arteries.

Mediastinum/Nodes: No discrete thyroid nodules. Unremarkable
esophagus. No pathologically enlarged axillary, mediastinal or gross
hilar lymph nodes, noting limited sensitivity for the detection of
hilar adenopathy on this noncontrast study.

Lungs/Pleura: No pneumothorax. No pleural effusion. Previously
described apical 6 mm upper lobe ground-glass pulmonary nodule is
absent on this scan. No acute consolidative airspace disease, lung
masses or new significant pulmonary nodules. Mild sharply marginated
subpleural reticulation in the anterior left upper lobe, unchanged,
compatible with mild radiation fibrosis.

Upper abdomen: Unremarkable.

Musculoskeletal: No aggressive appearing focal osseous lesions.
Moderate thoracic spondylosis. Focal soft tissue asymmetry in the
outer left breast (series 2/ image 68) is not appreciably changed.
IMPRESSION: 1. No evidence of metastatic disease in the chest. Previously
described apical right upper lobe ground-glass pulmonary nodule is
absent on this scan, compatible with resolution of a benign
inflammatory nodule.
2. Stable nonspecific focal soft tissue asymmetry in the outer left
breast, suggesting post treatment change, for which mammographic
correlation is advised.
3. Stable mild radiation fibrosis in the anterior left upper lobe.
4. Aortic atherosclerosis.  One vessel coronary atherosclerosis.

## 2019-04-08 ENCOUNTER — Ambulatory Visit: Payer: Medicare Other | Attending: Internal Medicine

## 2019-04-08 DIAGNOSIS — Z23 Encounter for immunization: Secondary | ICD-10-CM | POA: Insufficient documentation

## 2019-04-08 NOTE — Progress Notes (Signed)
   Covid-19 Vaccination Clinic  Name:  BERNIS HUEY    MRN: [id number] DOB: [birth year]  04/08/2019  Ms. Ellerbe was observed post Covid-19 immunization for 15 minutes without incidence. She was provided with Vaccine Information Sheet and instruction to access the V-Safe system.   Ms. Wiedmann was instructed to call 911 with any severe reactions post vaccine: Marland Kitchen Difficulty breathing  . Swelling of your face and throat  . A fast heartbeat  . A bad rash all over your body  . Dizziness and weakness    Immunizations Administered    Name Date Dose VIS Date Route   Pfizer COVID-19 Vaccine 04/08/2019  8:18 AM 0.3 mL 02/27/2019 Intramuscular   Manufacturer: [place]   Lot: [id number]   [place]: [id number]

## 2019-04-26 ENCOUNTER — Ambulatory Visit: Payer: Medicare Other | Attending: Internal Medicine

## 2019-04-26 DIAGNOSIS — Z23 Encounter for immunization: Secondary | ICD-10-CM | POA: Insufficient documentation

## 2019-04-26 NOTE — Progress Notes (Signed)
   Covid-19 Vaccination Clinic  Name:  Ashley Bray    MRN: [id number] DOB: 10/12/1940  04/26/2019  [name] was observed post Covid-19 immunization for 15 minutes without incidence. She was provided with Vaccine Information Sheet and instruction to access the V-Safe system.   [name] was instructed to call 911 with any severe reactions post vaccine: Marland Kitchen Difficulty breathing  . Swelling of your face and throat  . A fast heartbeat  . A bad rash all over your body  . Dizziness and weakness    Immunizations Administered    Name Date Dose VIS Date Route   Pfizer COVID-19 Vaccine 04/26/2019  8:06 AM 0.3 mL 02/27/2019 Intramuscular   Manufacturer: [place]   Lot: [id number]   [place]: [id number]

## 2019-05-07 IMAGING — MG MM DIGITAL DIAGNOSTIC BILAT W/ TOMO W/ CAD
9 of 14 series · 9 of 30 positions shown · non-contrast
Comparison: Previous exam(s).

CLINICAL DATA: History of left breast cancer status post lumpectomy
8742.

EXAM:
2D DIGITAL DIAGNOSTIC BILATERAL MAMMOGRAM WITH CAD AND ADJUNCT TOMO

[L MLO (1 of 3)]
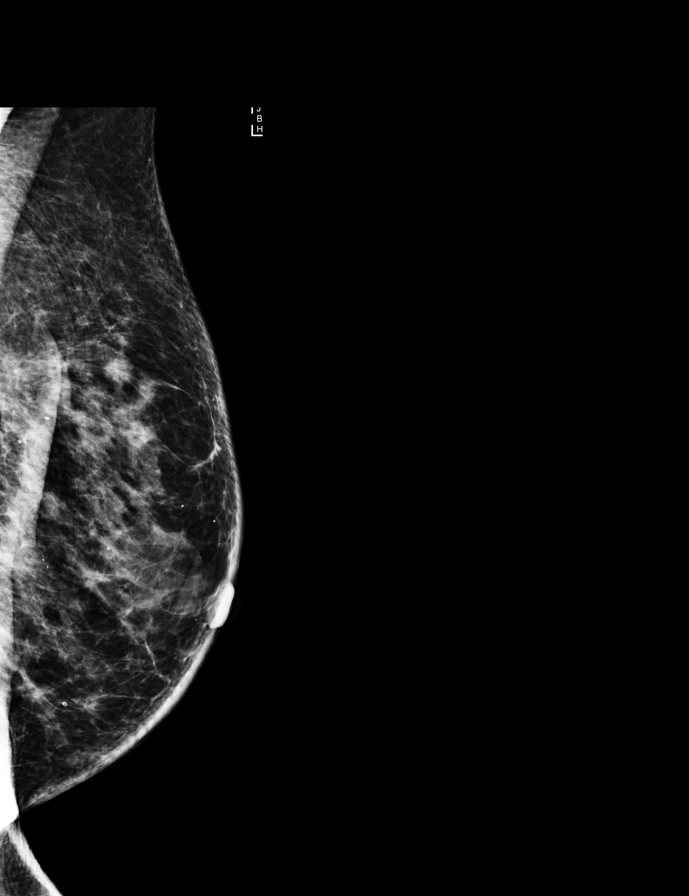

[L MLO (2 of 3)]
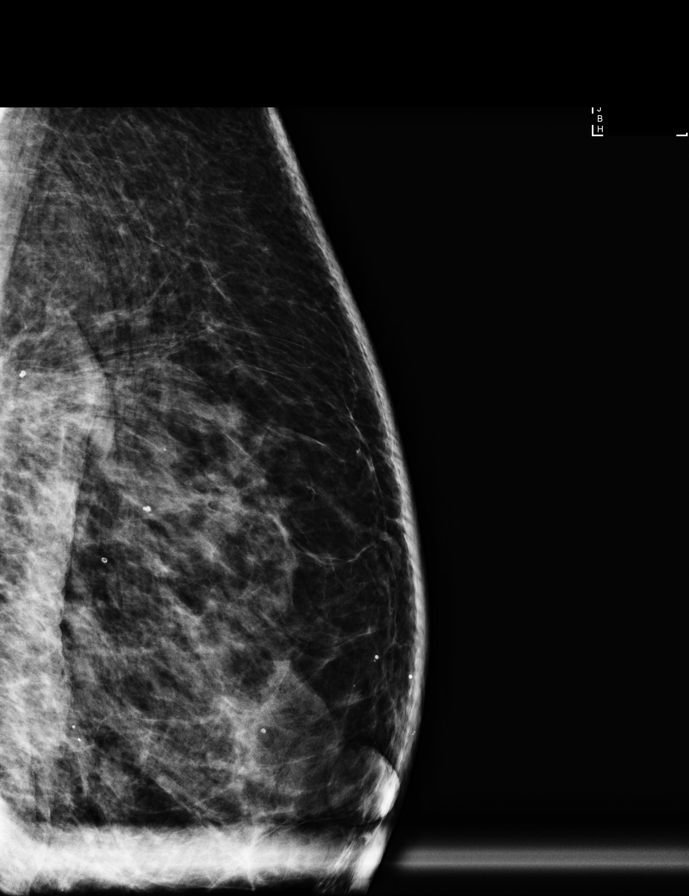

[R CC]
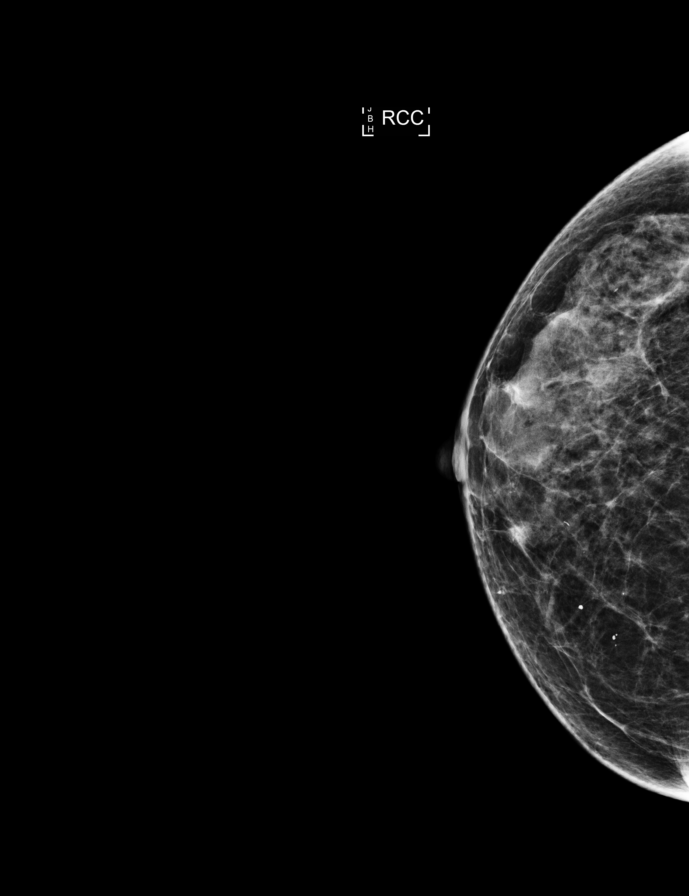

[R MLO synth-2D]
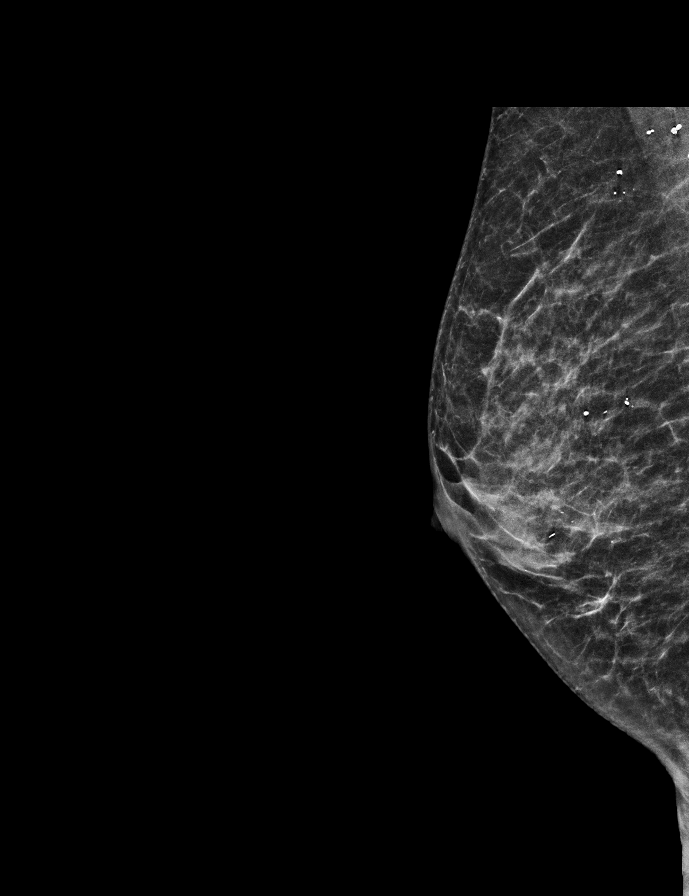

[L MLO (3 of 3)]
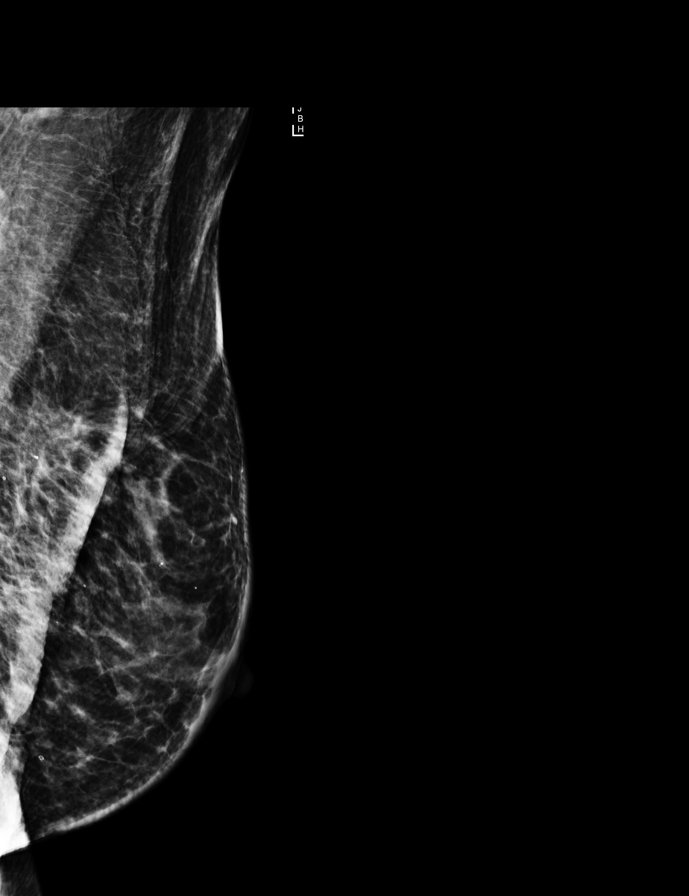

[R MLO]
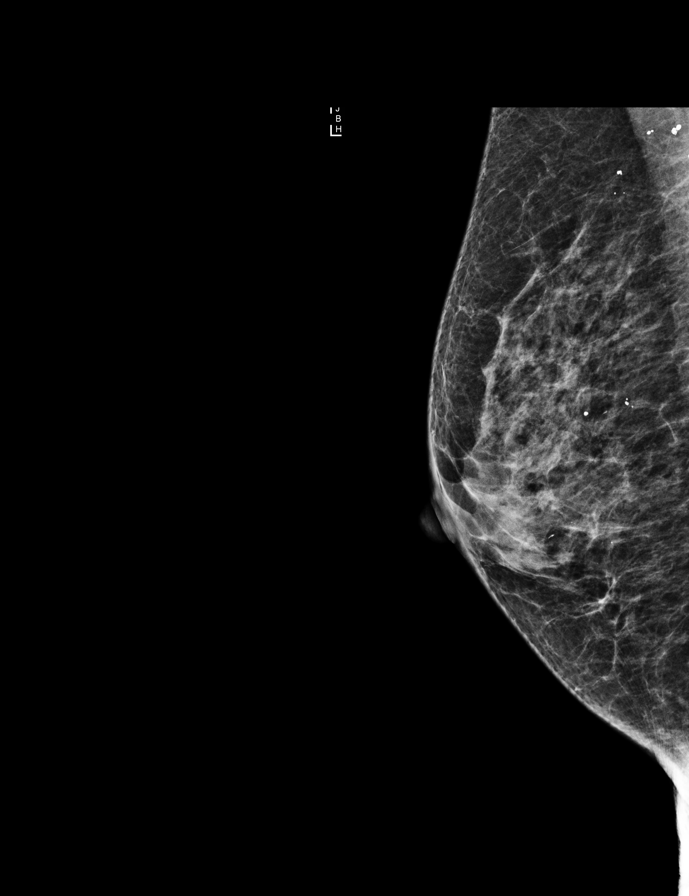

[L CC synth-2D]
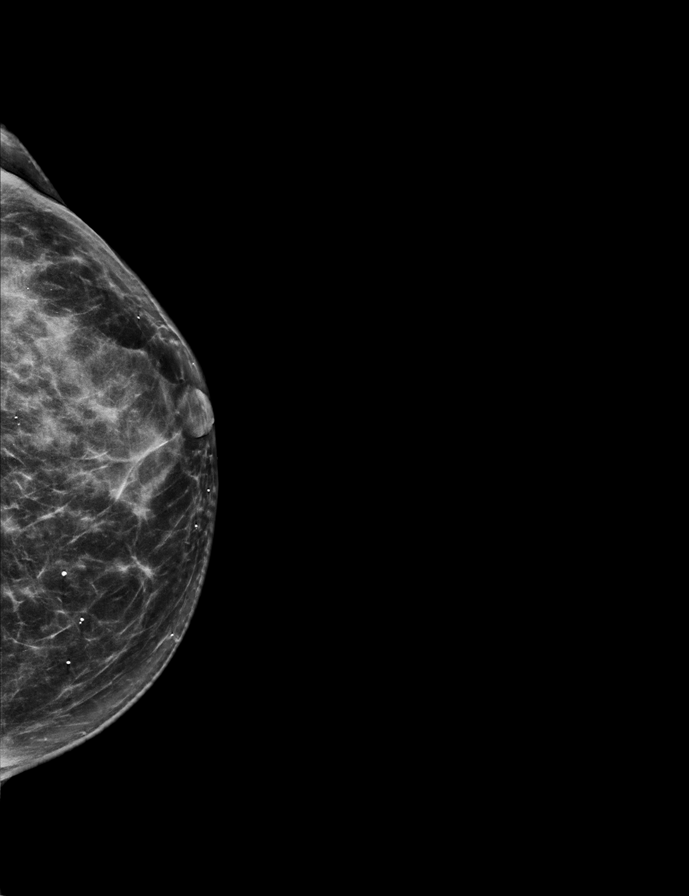

[L CC]
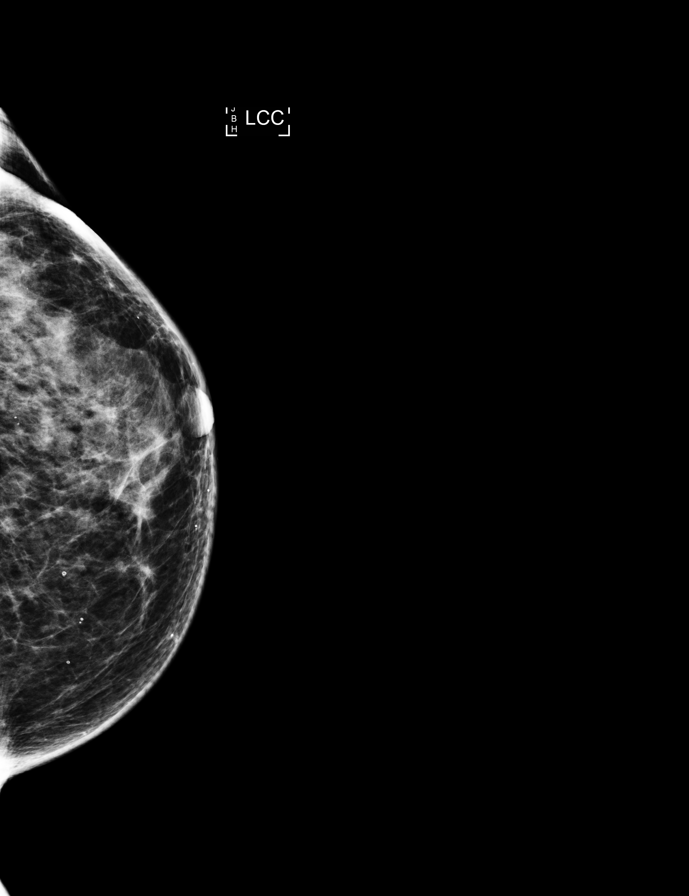

[R CC synth-2D]
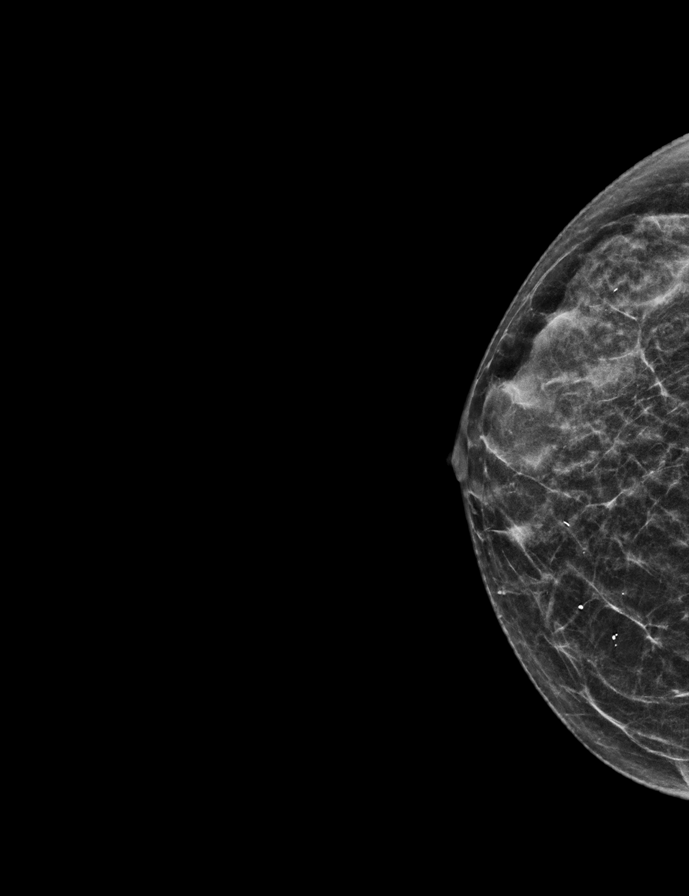

[9 of 30 positions shown; findings below may reference images not displayed]

ACR Breast Density Category c: The breast tissue is heterogeneously
dense, which may obscure small masses.
FINDINGS: Lumpectomy changes are seen in the left breast. No suspicious mass
or malignant type microcalcifications identified.

Mammographic images were processed with CAD.
IMPRESSION: No evidence of malignancy in either breast.

RECOMMENDATION:
Bilateral diagnostic mammogram in 1 year is recommended.

I have discussed the findings and recommendations with the patient.
Results were also provided in writing at the conclusion of the
visit. If applicable, a reminder letter will be sent to the patient
regarding the next appointment.

BI-RADS CATEGORY  2: Benign.

## 2019-10-02 ENCOUNTER — Other Ambulatory Visit: Payer: Self-pay | Admitting: Internal Medicine

## 2019-10-02 DIAGNOSIS — Z1231 Encounter for screening mammogram for malignant neoplasm of breast: Secondary | ICD-10-CM

## 2019-10-07 ENCOUNTER — Ambulatory Visit
Admission: RE | Admit: 2019-10-07 | Discharge: 2019-10-07 | Disposition: A | Discharge status: Home / Self Care | Payer: Medicare Other | Source: Ambulatory Visit | Attending: Internal Medicine | Admitting: Internal Medicine

## 2019-10-07 ENCOUNTER — Other Ambulatory Visit: Payer: Self-pay | Admitting: Internal Medicine

## 2019-10-07 DIAGNOSIS — Z1231 Encounter for screening mammogram for malignant neoplasm of breast: Secondary | ICD-10-CM | POA: Insufficient documentation

## 2019-10-07 DIAGNOSIS — Z853 Personal history of malignant neoplasm of breast: Secondary | ICD-10-CM

## 2020-01-15 ENCOUNTER — Other Ambulatory Visit: Payer: Self-pay

## 2020-01-15 ENCOUNTER — Other Ambulatory Visit
Admission: RE | Admit: 2020-01-15 | Discharge: 2020-01-15 | Disposition: A | Discharge status: Home / Self Care | Payer: Medicare Other | Source: Ambulatory Visit | Attending: Gastroenterology | Admitting: Gastroenterology

## 2020-01-15 DIAGNOSIS — Z20822 Contact with and (suspected) exposure to covid-19: Secondary | ICD-10-CM | POA: Insufficient documentation

## 2020-01-15 DIAGNOSIS — Z01812 Encounter for preprocedural laboratory examination: Secondary | ICD-10-CM | POA: Insufficient documentation

## 2020-01-16 LAB — SARS CORONAVIRUS 2 (TAT 6-24 HRS): SARS Coronavirus 2: NEGATIVE

## 2020-01-19 ENCOUNTER — Ambulatory Visit: Payer: Medicare Other | Admitting: Anesthesiology

## 2020-01-19 ENCOUNTER — Encounter
Admission: RE | Disposition: A | Discharge status: Home / Self Care | Payer: Self-pay | Source: Home / Self Care | Attending: Gastroenterology

## 2020-01-19 ENCOUNTER — Encounter: Payer: Self-pay | Admitting: *Deleted

## 2020-01-19 ENCOUNTER — Other Ambulatory Visit: Payer: Self-pay

## 2020-01-19 ENCOUNTER — Ambulatory Visit
Admission: RE | Admit: 2020-01-19 | Discharge: 2020-01-19 | Disposition: A | Discharge status: Home / Self Care | Payer: Medicare Other | Attending: Gastroenterology | Admitting: Gastroenterology

## 2020-01-19 DIAGNOSIS — K52831 Collagenous colitis: Secondary | ICD-10-CM | POA: Diagnosis not present

## 2020-01-19 DIAGNOSIS — K219 Gastro-esophageal reflux disease without esophagitis: Secondary | ICD-10-CM | POA: Insufficient documentation

## 2020-01-19 DIAGNOSIS — Z885 Allergy status to narcotic agent status: Secondary | ICD-10-CM | POA: Insufficient documentation

## 2020-01-19 DIAGNOSIS — Z923 Personal history of irradiation: Secondary | ICD-10-CM | POA: Diagnosis not present

## 2020-01-19 DIAGNOSIS — E119 Type 2 diabetes mellitus without complications: Secondary | ICD-10-CM | POA: Insufficient documentation

## 2020-01-19 DIAGNOSIS — Z853 Personal history of malignant neoplasm of breast: Secondary | ICD-10-CM | POA: Diagnosis not present

## 2020-01-19 DIAGNOSIS — M069 Rheumatoid arthritis, unspecified: Secondary | ICD-10-CM | POA: Diagnosis not present

## 2020-01-19 DIAGNOSIS — Z79899 Other long term (current) drug therapy: Secondary | ICD-10-CM | POA: Diagnosis not present

## 2020-01-19 DIAGNOSIS — I10 Essential (primary) hypertension: Secondary | ICD-10-CM | POA: Diagnosis not present

## 2020-01-19 DIAGNOSIS — K5730 Diverticulosis of large intestine without perforation or abscess without bleeding: Secondary | ICD-10-CM | POA: Insufficient documentation

## 2020-01-19 DIAGNOSIS — Z7984 Long term (current) use of oral hypoglycemic drugs: Secondary | ICD-10-CM | POA: Insufficient documentation

## 2020-01-19 DIAGNOSIS — K6389 Other specified diseases of intestine: Secondary | ICD-10-CM | POA: Diagnosis not present

## 2020-01-19 DIAGNOSIS — K529 Noninfective gastroenteritis and colitis, unspecified: Secondary | ICD-10-CM | POA: Diagnosis present

## 2020-01-19 DIAGNOSIS — Z90710 Acquired absence of both cervix and uterus: Secondary | ICD-10-CM | POA: Insufficient documentation

## 2020-01-19 DIAGNOSIS — E785 Hyperlipidemia, unspecified: Secondary | ICD-10-CM | POA: Insufficient documentation

## 2020-01-19 HISTORY — PX: COLONOSCOPY: SHX5424

## 2020-01-19 LAB — GLUCOSE, CAPILLARY: Glucose-Capillary: 101 mg/dL — ABNORMAL HIGH (ref 70–99)

## 2020-01-19 SURGERY — COLONOSCOPY
Anesthesia: General

## 2020-01-19 MED ORDER — SODIUM CHLORIDE 0.9 % IV SOLN: INTRAVENOUS | Status: DC | PRN

## 2020-01-19 MED ORDER — PROPOFOL 10 MG/ML IV BOLUS
INTRAVENOUS | Status: DC | PRN
  Administered 2020-01-19: 40 mg via INTRAVENOUS

## 2020-01-19 MED ORDER — PROPOFOL 500 MG/50ML IV EMUL
INTRAVENOUS | Status: DC | PRN
  Administered 2020-01-19: 150 ug/kg/min via INTRAVENOUS

## 2020-01-19 NOTE — H&P (Signed)
Outpatient short stay form Pre-procedure 01/19/2020 11:25 AM Raylene Miyamoto MD, MPH  Primary Physician: Dr. Edwina Barth  Reason for visit:  Diarrhea  History of present illness:   79 y/o lady with chronic diarrhea here for colonoscopy. States diarrhea better after switching one of her medicines. No blood thinners. History of hysterectomy and appendectomy. No family history of GI malignancies.   No current facility-administered medications for this encounter.  Medications Prior to Admission  Medication Sig Dispense Refill Last Dose  . acetaminophen (TYLENOL) 325 MG tablet Take 325 mg by mouth daily as needed for moderate pain or headache.   Past Week at Unknown time  . augmented betamethasone dipropionate (DIPROLENE-AF) 0.05 % ointment Apply topically 2 (two) times daily.   Past Week at Unknown time  . Blood Glucose Monitoring Suppl (ONE TOUCH ULTRA 2) W/DEVICE KIT   11 01/19/2020 at Unknown time  . folic acid (FOLVITE) 1 MG tablet Take 1 mg by mouth daily.  11 01/18/2020 at Unknown time  . gabapentin (NEURONTIN) 100 MG capsule Take 100 mg by mouth 3 (three) times daily.   01/18/2020 at Unknown time  . hydroxychloroquine (PLAQUENIL) 200 MG tablet Take 200 mg by mouth daily.   01/18/2020 at Unknown time  . metFORMIN (GLUCOPHAGE) 500 MG tablet Take 500 mg by mouth daily.    Past Week at Unknown time  . pravastatin (PRAVACHOL) 40 MG tablet Take 40 mg by mouth at bedtime.    01/18/2020 at Unknown time  . vitamin B-12 (CYANOCOBALAMIN) 1000 MCG tablet Take 1,000 mcg by mouth 2 (two) times daily.   01/18/2020 at Unknown time  . cetirizine (ZYRTEC ALLERGY) 10 MG tablet Take 1 tablet (10 mg total) by mouth daily. (Patient taking differently: Take 10 mg by mouth daily as needed for allergies. ) 30 tablet 12  at prn  . GLUCAGON EMERGENCY 1 MG injection Inject 1 mg into the muscle once as needed (low blood sugar).   0  at prn  . meloxicam (MOBIC) 7.5 MG tablet Take 7.5 mg by mouth daily as needed for pain.      at prn     Allergies  Allergen Reactions  . Codeine Nausea And Vomiting     Past Medical History:  Diagnosis Date  . Breast CA ([place])    left  . 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 Cough    CHRONIC  . Diabetes ([place])   . GERD (gastroesophageal reflux disease)   . HLD (hyperlipidemia)   . Hypertension    NO MEDS  . Personal history of radiation therapy   . Pneumonia    SMALL SPOT ON LUNG, HX RADIATION PNEUMONITIS   . PONV (postoperative nausea and vomiting)    after hysterectomy  . RA (rheumatoid arthritis) ([place])   . 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 Wears dentures    partial upper    Review of systems:  Otherwise negative.    Physical Exam  Gen: Alert, oriented. Appears stated age.  HEENT: PERRLA. Lungs: No respiratory distress CV: RRR Abd: soft, benign, no masses. Ext: No edema.    Planned procedures: Proceed with colonoscopy. The patient understands the nature of the planned procedure, indications, risks, alternatives and potential complications including but not limited to bleeding, infection, perforation, damage to internal organs and possible oversedation/side effects from anesthesia. The patient agrees and gives consent to proceed.  Please refer to procedure notes for findings, recommendations and  patient disposition/instructions.     Raylene Miyamoto MD, MPH Gastroenterology 01/19/2020  11:25 AM

## 2020-01-19 NOTE — Anesthesia Preprocedure Evaluation (Signed)
Anesthesia Evaluation  Patient identified by MRN, date of birth, ID band Patient awake    Reviewed: Allergy & Precautions, NPO status , Patient's Chart, lab work & pertinent test results  History of Anesthesia Complications (+) PONV and history of anesthetic complications  Airway Mallampati: I  TM Distance: >3 FB Neck ROM: Full    Dental  (+) Partial Upper, Partial Lower   Pulmonary neg sleep apnea, neg COPD, Patient abstained from smoking.Not current smoker,  Prior hx of radiation pneumonitis (for breast cancer). Now resolved after steroid course   Pulmonary exam normal breath sounds clear to auscultation       Cardiovascular Exercise Tolerance: Good METShypertension, (-) CAD and (-) Past MI (-) dysrhythmias  Rhythm:Regular Rate:Normal - Systolic murmurs    Neuro/Psych S/p cervical spine surgery   Neuromuscular disease negative psych ROS   GI/Hepatic GERD  ,(+)     (-) substance abuse  ,   Endo/Other  diabetes  Renal/GU negative Renal ROS     Musculoskeletal  (+) Arthritis , Rheumatoid disorders,    Abdominal   Peds  Hematology   Anesthesia Other Findings Past Medical History: No date: Breast CA ([place])     Comment:  left 2015: Breast cancer ([place])     Comment:  Ductal carcinoma in situ of the left breast. Had               radiation tx No date: Cough     Comment:  CHRONIC No date: Diabetes ([facility]) No date: GERD (gastroesophageal reflux disease) No date: HLD (hyperlipidemia) No date: Hypertension     Comment:  NO MEDS No date: Personal history of radiation therapy No date: Pneumonia     Comment:  SMALL SPOT ON LUNG, HX RADIATION PNEUMONITIS  No date: PONV (postoperative nausea and vomiting)     Comment:  after hysterectomy No date: RA (rheumatoid arthritis) ([facility]) No date: Radiation pneumonitis ([facility]) No date: Wears dentures     Comment:  partial upper  Reproductive/Obstetrics                              Anesthesia Physical Anesthesia Plan  ASA: III  Anesthesia Plan: General   Post-op Pain Management:    Induction: Intravenous  PONV Risk Score and Plan: 4 or greater and Ondansetron, Propofol infusion and TIVA  Airway Management Planned: Nasal Cannula  Additional Equipment: None  Intra-op Plan:   Post-operative Plan:   Informed Consent: I have reviewed the patients History and Physical, chart, labs and discussed the procedure including the risks, benefits and alternatives for the proposed anesthesia with the patient or authorized representative who has indicated his/her understanding and acceptance.     Dental advisory given  Plan Discussed with: CRNA and Surgeon  Anesthesia Plan Comments: (Discussed risks of anesthesia with patient, including possibility of difficulty with spontaneous ventilation under anesthesia necessitating airway intervention, PONV, and rare risks such as cardiac or respiratory or neurological events. Patient understands.)        Anesthesia Quick Evaluation

## 2020-01-19 NOTE — Transfer of Care (Signed)
Immediate Anesthesia Transfer of Care Note  Patient: Ashley Bray  Procedure(s) Performed: COLONOSCOPY (N/A )  Patient Location: PACU  Anesthesia Type:General  Level of Consciousness: awake, alert  and oriented  Airway & Oxygen Therapy: Patient Spontanous Breathing and Patient connected to nasal cannula oxygen  Post-op Assessment: Report given to RN and Post -op Vital signs reviewed and stable  Post vital signs: Reviewed and stable  Last Vitals:  Vitals Value Taken Time  BP 106/62 01/19/20 1200  Temp    Pulse 82 01/19/20 1201  Resp 15 01/19/20 1201  SpO2 99 % 01/19/20 1201  Vitals shown include unvalidated device data.  Last Pain:  Vitals:   01/19/20 1058  TempSrc: Temporal  PainSc: 0-No pain         Complications: No complications documented.

## 2020-01-19 NOTE — Anesthesia Postprocedure Evaluation (Signed)
Anesthesia Post Note  Patient: Ashley Bray  Procedure(s) Performed: COLONOSCOPY (N/A )  Patient location during evaluation: Endoscopy Anesthesia Type: General Level of consciousness: awake and alert Pain management: pain level controlled Vital Signs Assessment: post-procedure vital signs reviewed and stable Respiratory status: spontaneous breathing, nonlabored ventilation, respiratory function stable and patient connected to nasal cannula oxygen Cardiovascular status: blood pressure returned to baseline and stable Postop Assessment: no apparent nausea or vomiting Anesthetic complications: no   No complications documented.   Last Vitals:  Vitals:   01/19/20 1210 01/19/20 1220  BP: 119/81 (!) 159/88  Pulse: 81 78  Resp: 17 16  Temp:    SpO2: 99% 98%    Last Pain:  Vitals:   01/19/20 1220  TempSrc:   PainSc: 0-No pain                 Arita Miss

## 2020-01-19 NOTE — Interval H&P Note (Signed)
History and Physical Interval Note:  01/19/2020 11:26 AM  Ashley Bray  has presented today for surgery, with the diagnosis of DIARRHEA,PERSONAL HX.OF COLON POLYPS.  The various methods of treatment have been discussed with the patient and family. After consideration of risks, benefits and other options for treatment, the patient has consented to  Procedure(s): COLONOSCOPY (N/A) as a surgical intervention.  The patient's history has been reviewed, patient examined, no change in status, stable for surgery.  I have reviewed the patient's chart and labs.  Questions were answered to the patient's satisfaction.     Lesly Rubenstein  Ok to proceed with colonoscopy

## 2020-01-19 NOTE — Op Note (Signed)
[facility] Gastroenterology Patient Name: Ashley Bray Procedure Date: 01/19/2020 11:05 AM MRN: [id number] Account #: [id number] Date of Birth: [birth year] Admit Type: Outpatient Age: 79 Room: [facility] ENDO ROOM 1 Gender: Female Note Status: Finalized Procedure:             Colonoscopy Indications:           Chronic diarrhea Providers:             Andrey Farmer MD, MD Medicines:             Monitored Anesthesia Care Complications:         No immediate complications. Estimated blood loss:                         Minimal. Procedure:             Pre-Anesthesia Assessment:                        - Prior to the procedure, a History and Physical was                         performed, and patient medications and allergies were                         reviewed. The patient is competent. The risks and                         benefits of the procedure and the sedation options and                         risks were discussed with the patient. All questions                         were answered and informed consent was obtained.                         Patient identification and proposed procedure were                         verified by the physician, the nurse, the anesthetist                         and the technician in the endoscopy suite. Mental                         Status Examination: alert and oriented. Airway                         Examination: normal oropharyngeal airway and neck                         mobility. Respiratory Examination: clear to                         auscultation. CV Examination: normal. Prophylactic                         Antibiotics: The patient does not require prophylactic  antibiotics. Prior Anticoagulants: The patient has                         taken no previous anticoagulant or antiplatelet                         agents. ASA Grade Assessment: II - A patient with mild                         systemic disease.  After reviewing the risks and                         benefits, the patient was deemed in satisfactory                         condition to undergo the procedure. The anesthesia                         plan was to use monitored anesthesia care (MAC).                         Immediately prior to administration of medications,                         the patient was re-assessed for adequacy to receive                         sedatives. The heart rate, respiratory rate, oxygen                         saturations, blood pressure, adequacy of pulmonary                         ventilation, and response to care were monitored                         throughout the procedure. The physical status of the                         patient was re-assessed after the procedure.                        After obtaining informed consent, the colonoscope was                         passed under direct vision. Throughout the procedure,                         the patient's blood pressure, pulse, and oxygen                         saturations were monitored continuously. The was                         introduced through the anus and advanced to the the                         cecum, identified by appendiceal orifice and ileocecal  valve. The colonoscopy was performed without                         difficulty. The patient tolerated the procedure well.                         The quality of the bowel preparation was good. Findings:      The perianal and digital rectal examinations were normal.      A few small-mouthed diverticula were found in the sigmoid colon and       ascending colon.      The colon (entire examined portion) appeared normal. Biopsies for       histology were taken with a cold forceps from the entire colon for       evaluation of microscopic colitis. Estimated blood loss was minimal.      The exam was otherwise without abnormality on direct and retroflexion        views. Impression:            - Diverticulosis in the sigmoid colon and in the                         ascending colon.                        - The entire examined colon is normal. Biopsied.                        - The examination was otherwise normal on direct and                         retroflexion views. Recommendation:        - Discharge patient to home.                        - Resume previous diet.                        - Continue present medications.                        - Await pathology results.                        - Repeat colonoscopy is not recommended due to current                         age (63 years or older) for screening purposes.                        - Return to referring physician as previously                         scheduled. Procedure Code(s):     --- Professional ---                        [phone], Colonoscopy, flexible; with biopsy, single or                         multiple Diagnosis Code(s):     --- Professional ---  K52.9, Noninfective gastroenteritis and colitis,                         unspecified                        K57.30, Diverticulosis of large intestine without                         perforation or abscess without bleeding CPT copyright 2019 American Medical Association. All rights reserved. The codes documented in this report are preliminary and upon coder review may  be revised to meet current compliance requirements. Andrey Farmer, MD Andrey Farmer MD, MD 01/19/2020 11:58:53 AM Number of Addenda: 0 Note Initiated On: 01/19/2020 11:05 AM Scope Withdrawal Time: 0 hours 7 minutes 58 seconds  Total Procedure Duration: 0 hours 12 minutes 11 seconds  Estimated Blood Loss:  Estimated blood loss was minimal.      [facility]

## 2020-01-19 NOTE — Anesthesia Procedure Notes (Signed)
Date/Time: 01/19/2020 11:45 AM Performed by: Nelda Marseille, CRNA Pre-anesthesia Checklist: Patient identified, Emergency Drugs available, Suction available, Patient being monitored and Timeout performed Oxygen Delivery Method: Nasal cannula

## 2020-01-20 ENCOUNTER — Encounter: Payer: Self-pay | Admitting: Gastroenterology

## 2020-01-20 LAB — SURGICAL PATHOLOGY

## 2020-05-30 IMAGING — MG MM DIGITAL DIAGNOSTIC BILAT W/ TOMO W/ CAD
6 of 11 series · 6 of 31 positions shown · non-contrast
Comparison: Previous exam(s).

CLINICAL DATA: Personal history of left breast cancer status post
lumpectomy 6016

EXAM:
DIGITAL DIAGNOSTIC BILATERAL MAMMOGRAM WITH CAD AND TOMO

[L MLO]
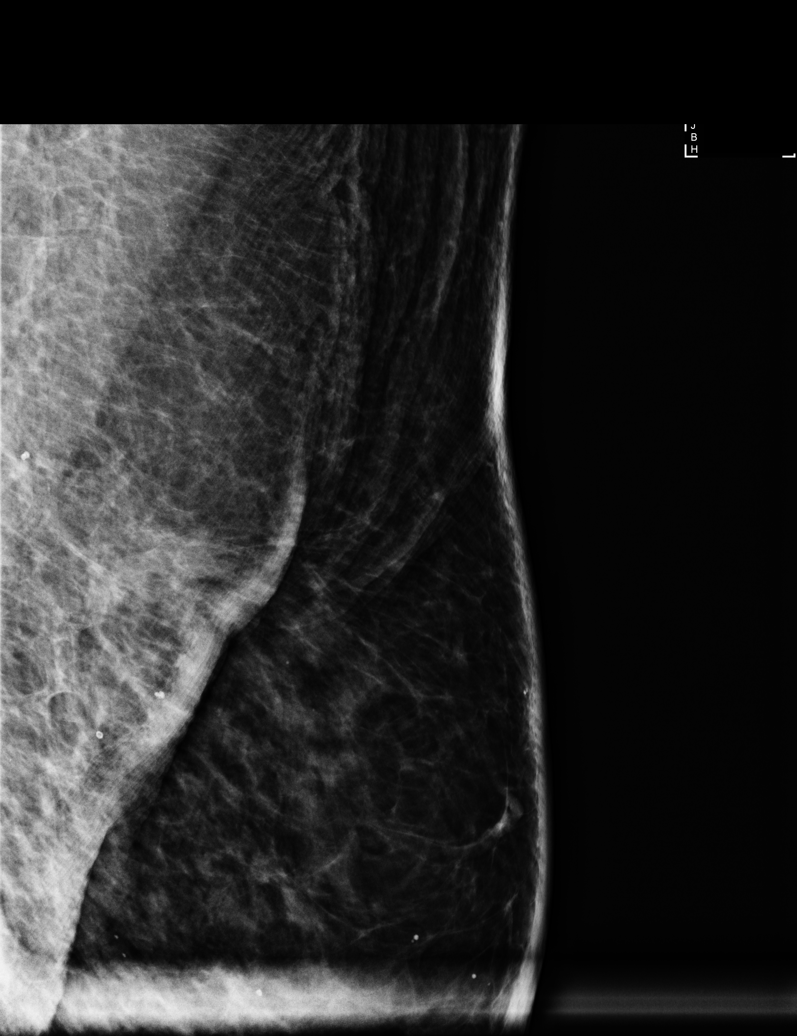

[L MLO synth-2D (1 of 2)]
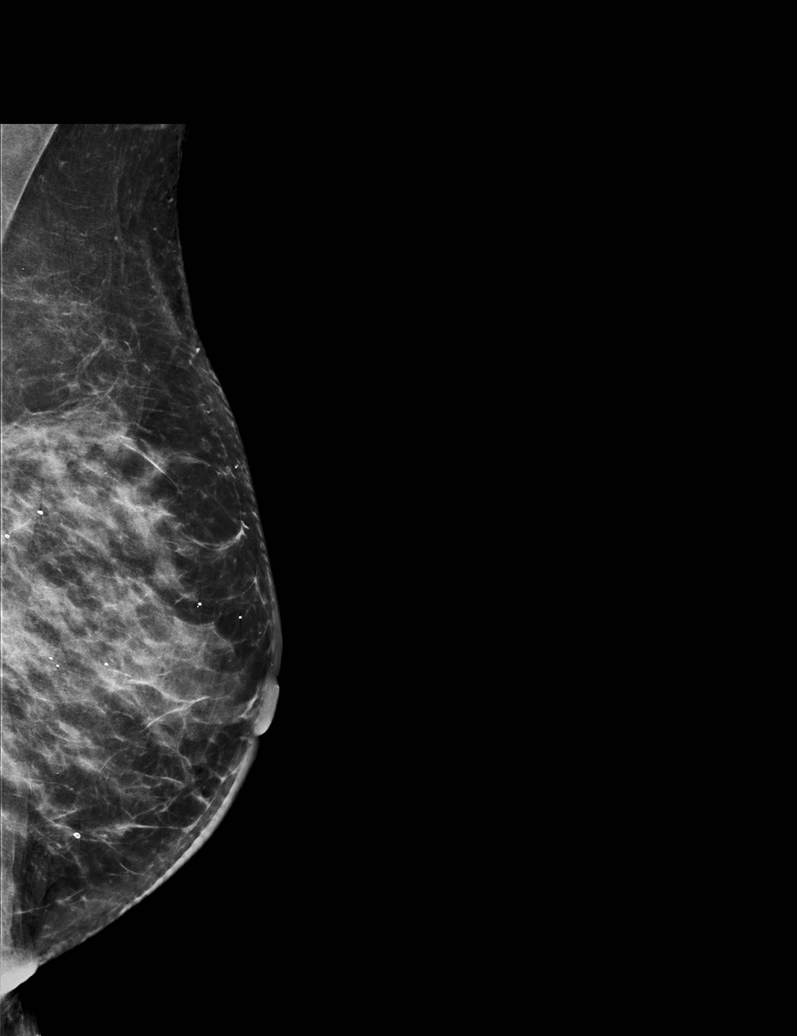

[L CC synth-2D]
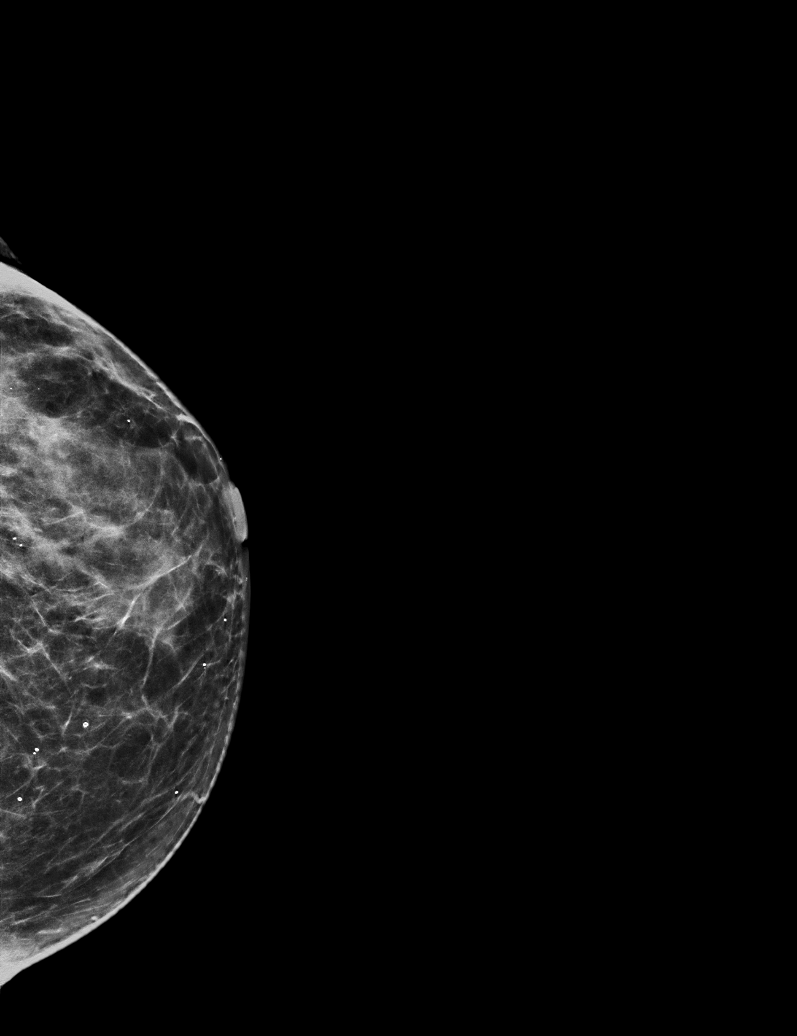

[L MLO synth-2D (2 of 2)]
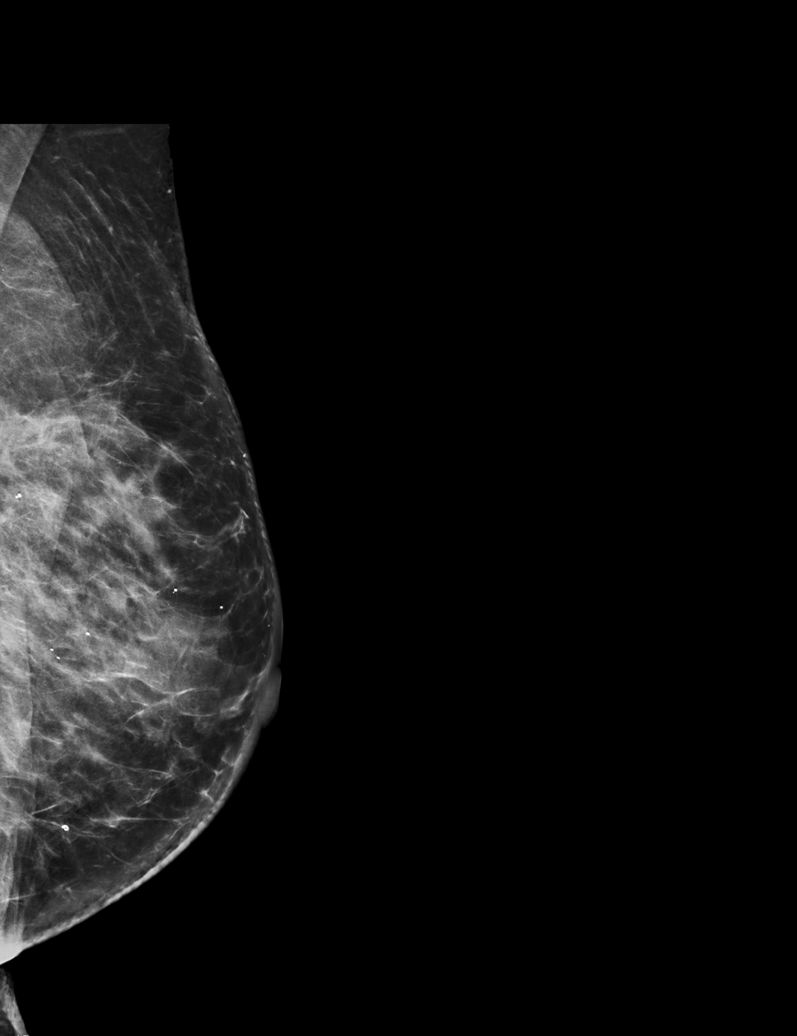

[R CC synth-2D]
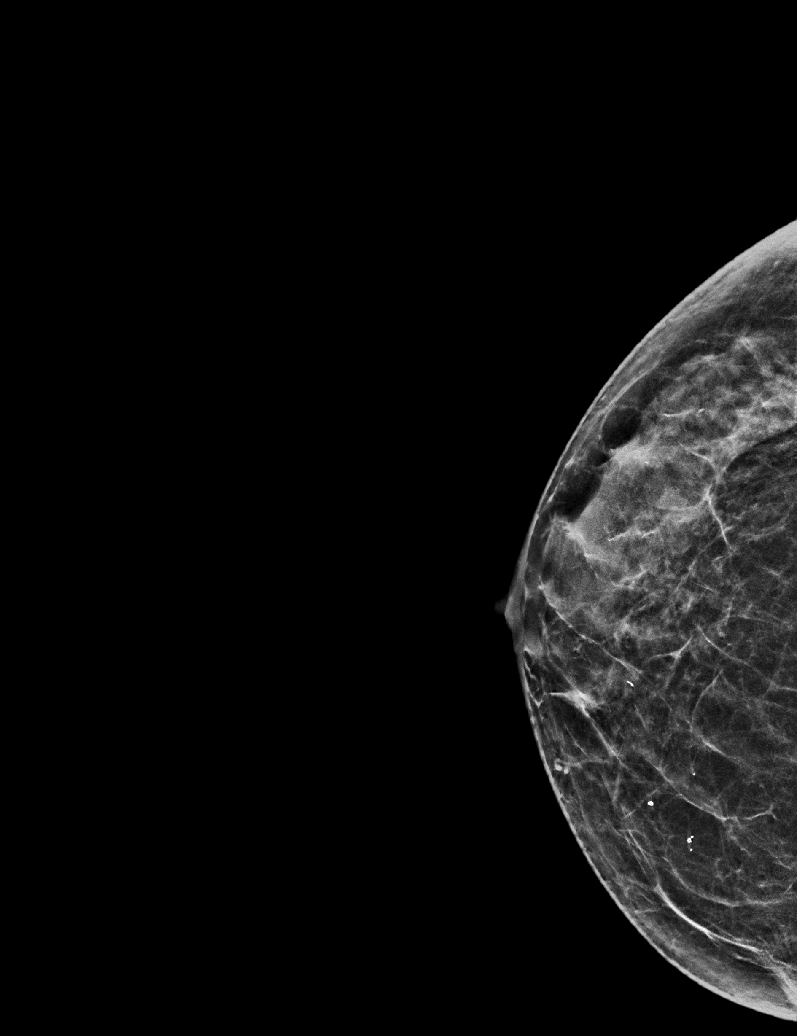

[R MLO synth-2D]
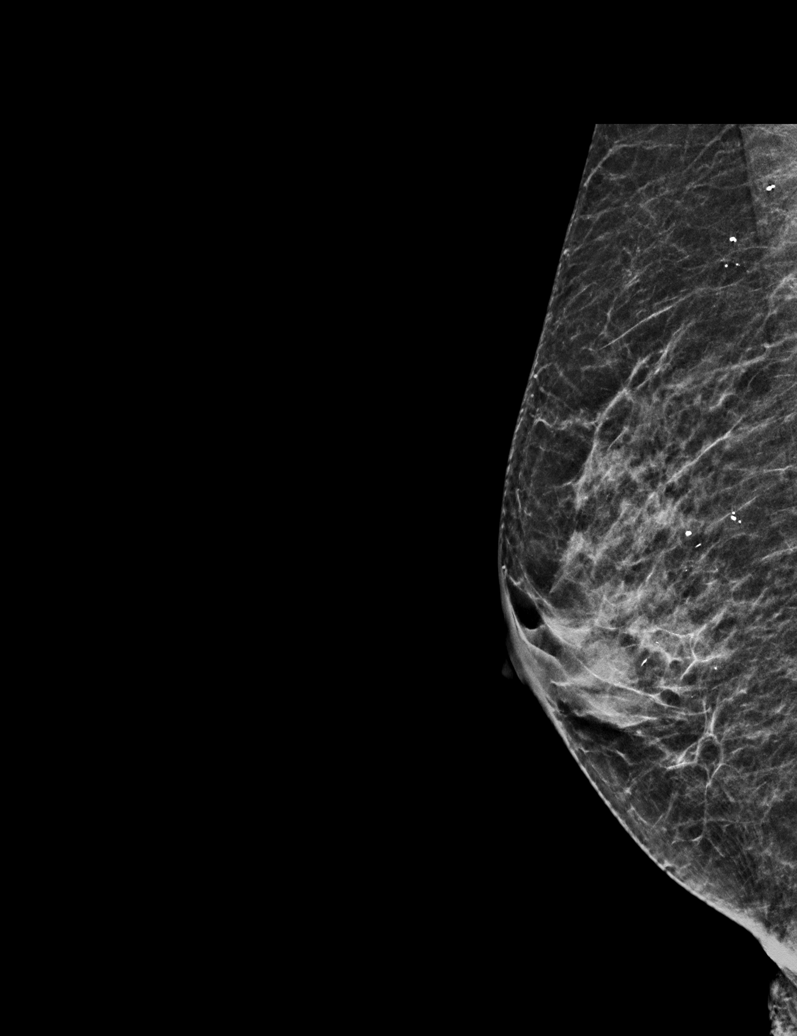

[6 of 31 positions shown; findings below may reference images not displayed]

ACR Breast Density Category c: The breast tissue is heterogeneously
dense, which may obscure small masses.
FINDINGS: Cc and MLO views of bilateral breasts, spot tangential view of left
breast are submitted. Stable postsurgical changes are identified
within the left breast. No suspicious abnormalities identified
bilaterally.

Mammographic images were processed with CAD.
IMPRESSION: Benign findings.

RECOMMENDATION:
Bilateral diagnostic mammogram in 1 year.

I have discussed the findings and recommendations with the patient.
Results were also provided in writing at the conclusion of the
visit. If applicable, a reminder letter will be sent to the patient
regarding the next appointment.

BI-RADS CATEGORY  2: Benign.

## 2020-09-28 ENCOUNTER — Other Ambulatory Visit: Payer: Self-pay | Admitting: Neurosurgery

## 2020-10-04 NOTE — Pre-Procedure Instructions (Signed)
Surgical Instructions    Your procedure is scheduled on 10/07/20.  Report to [facility] Main Entrance "A" at 10:10 A.M., then check in with the Admitting office.  Call this number if you have problems the morning of surgery:  [phone]   If you have any questions prior to your surgery date call [phone]: Open Monday-Friday 8am-4pm    Remember:  Do not eat or drink after midnight the night before your surgery    Take these medicines the morning of surgery with A SIP OF WATER  acetaminophen (TYLENOL) if needed cetirizine (ZYRTEC ALLERGY) if needed gabapentin (NEURONTIN) hydroxychloroquine (PLAQUENIL) hydroxypropyl methylcellulose / hypromellose (ISOPTO TEARS / GONIOVISC) 2.5 %  If needed  .howto metFORMIN (GLUCOPHAGE)  As of today, STOP taking any Aspirin (unless otherwise instructed by your surgeon) Aleve, Naproxen, Ibuprofen, Motrin, Advil, Goody's, BC's, all herbal medications, fish oil, and all vitamins.  WHAT DO I DO ABOUT MY DIABETES MEDICATION?   Do not take oral diabetes medicines (pills) the morning of surgery.  HOW TO MANAGE YOUR DIABETES BEFORE AND AFTER SURGERY  Why is it important to control my blood sugar before and after surgery? Improving blood sugar levels before and after surgery helps healing and can limit problems. A way of improving blood sugar control is eating a healthy diet by:  Eating less sugar and carbohydrates  Increasing activity/exercise  Talking with your doctor about reaching your blood sugar goals High blood sugars (greater than 180 mg/dL) can raise your risk of infections and slow your recovery, so you will need to focus on controlling your diabetes during the weeks before surgery. Make sure that the doctor who takes care of your diabetes knows about your planned surgery including the date and location.  How do I manage my blood sugar before surgery? Check your blood sugar at least 4 times a day, starting 2 days before surgery, to make  sure that the level is not too high or low.  Check your blood sugar the morning of your surgery when you wake up and every 2 hours until you get to the Short Stay unit.  If your blood sugar is less than 70 mg/dL, you will need to treat for low blood sugar: Do not take insulin. Treat a low blood sugar (less than 70 mg/dL) with  cup of clear juice (cranberry or apple), 4 glucose tablets, OR glucose gel. Recheck blood sugar in 15 minutes after treatment (to make sure it is greater than 70 mg/dL). If your blood sugar is not greater than 70 mg/dL on recheck, call [phone] for further instructions. Report your blood sugar to the short stay nurse when you get to Short Stay.  If you are admitted to the hospital after surgery: Your blood sugar will be checked by the staff and you will probably be given insulin after surgery (instead of oral diabetes medicines) to make sure you have good blood sugar levels. The goal for blood sugar control after surgery is 80-180 mg/dL.           Do not wear jewelry or makeup Do not wear lotions, powders, perfumes/colognes, or deodorant. Do not shave 48 hours prior to surgery.  Men may shave face and neck. Do not bring valuables to the hospital. DO Not wear nail polish, gel polish, artificial nails, or any other type of covering on  natural nails including finger and toenails. If patients have artificial nails, gel coating, etc. that need to be removed by a nail salon please have this  removed prior to surgery or surgery may need to be canceled/delayed if the surgeon/ anesthesia feels like the patient is unable to be adequately monitored.             [facility] is not responsible for any belongings or valuables.  Do NOT Smoke (Tobacco/Vaping) or drink Alcohol 24 hours prior to your procedure If you use a CPAP at night, you may bring all equipment for your overnight stay.   Contacts, glasses, dentures or bridgework may not be worn into surgery, please bring cases  for these belongings   For patients admitted to the hospital, discharge time will be determined by your treatment team.   Patients discharged the day of surgery will not be allowed to drive home, and someone needs to stay with them for 24 hours.  ONLY 1 SUPPORT PERSON MAY BE PRESENT WHILE YOU ARE IN SURGERY. IF YOU ARE TO BE ADMITTED ONCE YOU ARE IN YOUR ROOM YOU WILL BE ALLOWED TWO (2) VISITORS.  Minor children may have two parents present. Special consideration for safety and communication needs will be reviewed on a case by case basis.  Special instructions:    Oral Hygiene is also important to reduce your risk of infection.  Remember - BRUSH YOUR TEETH THE MORNING OF SURGERY WITH YOUR REGULAR TOOTHPASTE   [facility]- Preparing For Surgery  Before surgery, you can play an important role. Because skin is not sterile, your skin needs to be as free of germs as possible. You can reduce the number of germs on your skin by washing with CHG (chlorahexidine gluconate) Soap before surgery.  CHG is an antiseptic cleaner which kills germs and bonds with the skin to continue killing germs even after washing.     Please do not use if you have an allergy to CHG or antibacterial soaps. If your skin becomes reddened/irritated stop using the CHG.  Do not shave (including legs and underarms) for at least 48 hours prior to first CHG shower. It is OK to shave your face.  Please follow these instructions carefully.     Shower the NIGHT BEFORE SURGERY and the MORNING OF SURGERY with CHG Soap.   If you chose to wash your hair, wash your hair first as usual with your normal shampoo. After you shampoo, rinse your hair and body thoroughly to remove the shampoo.  Then [employer] and genitals (private parts) with your normal soap and rinse thoroughly to remove soap.  After that Use CHG Soap as you would any other liquid soap. You can apply CHG directly to the skin and wash gently with a scrungie or a clean  washcloth.   Apply the CHG Soap to your body ONLY FROM THE NECK DOWN.  Do not use on open wounds or open sores. Avoid contact with your eyes, ears, mouth and genitals (private parts). Wash Face and genitals (private parts)  with your normal soap.   Wash thoroughly, paying special attention to the area where your surgery will be performed.  Thoroughly rinse your body with warm water from the neck down.  DO NOT shower/wash with your normal soap after using and rinsing off the CHG Soap.  Pat yourself dry with a CLEAN TOWEL.  Wear CLEAN PAJAMAS to bed the night before surgery  Place CLEAN SHEETS on your bed the night before your surgery  DO NOT SLEEP WITH PETS.   Day of Surgery:  Take a shower with CHG soap. Wear Clean/Comfortable clothing the morning of surgery  Do not apply any deodorants/lotions.   Remember to brush your teeth WITH YOUR REGULAR TOOTHPASTE.   Please read over the following fact sheets that you were given.

## 2020-10-05 ENCOUNTER — Encounter (HOSPITAL_COMMUNITY)
Admission: RE | Admit: 2020-10-05 | Discharge: 2020-10-05 | Disposition: A | Discharge status: Home / Self Care | Payer: Medicare Other | Source: Ambulatory Visit | Attending: Neurosurgery | Admitting: Neurosurgery

## 2020-10-05 ENCOUNTER — Encounter (HOSPITAL_COMMUNITY): Payer: Self-pay

## 2020-10-05 ENCOUNTER — Other Ambulatory Visit: Payer: Self-pay

## 2020-10-05 DIAGNOSIS — Z01818 Encounter for other preprocedural examination: Secondary | ICD-10-CM | POA: Diagnosis not present

## 2020-10-05 LAB — BASIC METABOLIC PANEL
Anion gap: 11 (ref 5–15)
BUN: 11 mg/dL (ref 8–23)
CO2: 26 mmol/L (ref 22–32)
Calcium: 9.6 mg/dL (ref 8.9–10.3)
Chloride: 97 mmol/L — ABNORMAL LOW (ref 98–111)
Creatinine, Ser: 0.46 mg/dL (ref 0.44–1.00)
GFR, Estimated: >60 mL/min (ref >60)
Glucose, Bld: 106 mg/dL — ABNORMAL HIGH (ref 70–99)
Potassium: 4.2 mmol/L (ref 3.5–5.1)
Sodium: 134 mmol/L — ABNORMAL LOW (ref 135–145)

## 2020-10-05 LAB — SURGICAL PCR SCREEN
MRSA, PCR: NEGATIVE
Staphylococcus aureus: NEGATIVE

## 2020-10-05 LAB — GLUCOSE, CAPILLARY: Glucose-Capillary: 146 mg/dL — ABNORMAL HIGH (ref 70–99)

## 2020-10-05 LAB — CBC WITH DIFFERENTIAL/PLATELET
Abs Immature Granulocytes: 0.02 10*3/uL (ref 0.00–0.07)
Basophils Absolute: 0 10*3/uL (ref 0.0–0.1)
Basophils Relative: 0 %
Eosinophils Absolute: 0.2 10*3/uL (ref 0.0–0.5)
Eosinophils Relative: 3 %
HCT: 39.5 % (ref 36.0–46.0)
Hemoglobin: 13 g/dL (ref 12.0–15.0)
Immature Granulocytes: 0 %
Lymphocytes Relative: 12 %
Lymphs Abs: 0.6 10*3/uL — ABNORMAL LOW (ref 0.7–4.0)
MCH: 32.4 pg (ref 26.0–34.0)
MCHC: 32.9 g/dL (ref 30.0–36.0)
MCV: 98.5 fL (ref 80.0–100.0)
Monocytes Absolute: 0.5 10*3/uL (ref 0.1–1.0)
Monocytes Relative: 8 %
Neutro Abs: 4.2 10*3/uL (ref 1.7–7.7)
Neutrophils Relative %: 77 %
Platelets: 307 10*3/uL (ref 150–400)
RBC: 4.01 MIL/uL (ref 3.87–5.11)
RDW: 12.7 % (ref 11.5–15.5)
WBC: 5.5 10*3/uL (ref 4.0–10.5)
nRBC: 0 % (ref 0.0–0.2)

## 2020-10-05 NOTE — Progress Notes (Signed)
PCP -Harrel Lemon MD  Cardiologist - none  PPM/ICD - denies Device Orders -  Rep Notified -   Chest x-ray -  EKG - 10/05/20 Stress Test - denies ECHO - denies Cardiac Cath - denies   Sleep Study - denies CPAP - no  Fasting Blood Sugar - 90-110 Checks Blood Sugar every morning  Blood Thinner Instructions: n/a Aspirin Instructions:n/a  ERAS Protcol -no PRE-SURGERY Ensure or G2-   COVID TEST- Pt not tested today because she reported testing positive 08/17/20 at the [facility] Department. She was seen by her PCP,Dr. Edwina Barth, on June 2,2022. Per Dr. Tillman Sers progress note on 08/18/2020, pt was prescribed Molnupiravir for Covid after testing positive on a home test and at the health department. Discussed this information with Karoline Caldwell Anesthesia PA who stated that Dr. Tillman Sers documentation was sufficient to prevent pt from being retested for Covid. Also requested Covid test results from [facility] Department.    Anesthesia review: yes.Abnormal EKG  Patient denies shortness of breath, fever, cough and chest pain at PAT appointment   All instructions explained to the patient, with a verbal understanding of the material. Patient agrees to go over the instructions while at home for a better understanding. Patient also instructed to self quarantine after being tested for COVID-19. The opportunity to ask questions was provided.

## 2020-10-05 NOTE — Pre-Procedure Instructions (Signed)
Surgical Instructions    Your procedure is scheduled on 10/07/20.  Report to [facility] Main Entrance "A" at 10:10 A.M., then check in with the Admitting office.  Call this number if you have problems the morning of surgery:  [phone]   If you have any questions prior to your surgery date call [phone]: Open Monday-Friday 8am-4pm    Remember:  Do not eat or drink after midnight the night before your surgery    Take these medicines the morning of surgery with A SIP OF WATER  acetaminophen (TYLENOL) if needed cetirizine (ZYRTEC ALLERGY) if needed gabapentin (NEURONTIN) hydroxypropyl methylcellulose / hypromellose (ISOPTO TEARS / GONIOVISC) 2.5 %  If needed   As of today, STOP taking any Aspirin (unless otherwise instructed by your surgeon) Aleve, Naproxen, Ibuprofen, Motrin, Advil, Goody's, BC's, all herbal medications, fish oil, and all vitamins. Stop taking Plaquenil as of today.  WHAT DO I DO ABOUT MY DIABETES MEDICATION?   Do not take oral diabetes medicines (pills) the morning of surgery.       DO NOT take metFORMIN (GLUCOPHAGE) the morning of surgery.   HOW TO MANAGE YOUR DIABETES BEFORE AND AFTER SURGERY  Why is it important to control my blood sugar before and after surgery? Improving blood sugar levels before and after surgery helps healing and can limit problems. A way of improving blood sugar control is eating a healthy diet by:  Eating less sugar and carbohydrates  Increasing activity/exercise  Talking with your doctor about reaching your blood sugar goals High blood sugars (greater than 180 mg/dL) can raise your risk of infections and slow your recovery, so you will need to focus on controlling your diabetes during the weeks before surgery. Make sure that the doctor who takes care of your diabetes knows about your planned surgery including the date and location.  How do I manage my blood sugar before surgery? Check your blood sugar at least 4 times a day,  starting 2 days before surgery, to make sure that the level is not too high or low.  Check your blood sugar the morning of your surgery when you wake up and every 2 hours until you get to the Short Stay unit.  If your blood sugar is less than 70 mg/dL, you will need to treat for low blood sugar: Do not take insulin. Treat a low blood sugar (less than 70 mg/dL) with  cup of clear juice (cranberry or apple), 4 glucose tablets, OR glucose gel. Recheck blood sugar in 15 minutes after treatment (to make sure it is greater than 70 mg/dL). If your blood sugar is not greater than 70 mg/dL on recheck, call [phone] for further instructions. Report your blood sugar to the short stay nurse when you get to Short Stay.  If you are admitted to the hospital after surgery: Your blood sugar will be checked by the staff and you will probably be given insulin after surgery (instead of oral diabetes medicines) to make sure you have good blood sugar levels. The goal for blood sugar control after surgery is 80-180 mg/dL.           Do not wear jewelry or makeup Do not wear lotions, powders, perfumes/colognes, or deodorant. Do not shave 48 hours prior to surgery.  Men may shave face and neck. Do not bring valuables to the hospital. DO Not wear nail polish, gel polish, artificial nails, or any other type of covering on  natural nails including finger and toenails. If patients have  artificial nails, gel coating, etc. that need to be removed by a nail salon please have this removed prior to surgery or surgery may need to be canceled/delayed if the surgeon/ anesthesia feels like the patient is unable to be adequately monitored.             [facility] is not responsible for any belongings or valuables.  Do NOT Smoke (Tobacco/Vaping) or drink Alcohol 24 hours prior to your procedure If you use a CPAP at night, you may bring all equipment for your overnight stay.   Contacts, glasses, dentures or bridgework may not  be worn into surgery, please bring cases for these belongings   For patients admitted to the hospital, discharge time will be determined by your treatment team.   Patients discharged the day of surgery will not be allowed to drive home, and someone needs to stay with them for 24 hours.  ONLY 1 SUPPORT PERSON MAY BE PRESENT WHILE YOU ARE IN SURGERY. IF YOU ARE TO BE ADMITTED ONCE YOU ARE IN YOUR ROOM YOU WILL BE ALLOWED TWO (2) VISITORS.  Minor children may have two parents present. Special consideration for safety and communication needs will be reviewed on a case by case basis.  Special instructions:    Oral Hygiene is also important to reduce your risk of infection.  Remember - BRUSH YOUR TEETH THE MORNING OF SURGERY WITH YOUR REGULAR TOOTHPASTE   [place]- Preparing For Surgery  Before surgery, you can play an important role. Because skin is not sterile, your skin needs to be as free of germs as possible. You can reduce the number of germs on your skin by washing with CHG (chlorahexidine gluconate) Soap before surgery.  CHG is an antiseptic cleaner which kills germs and bonds with the skin to continue killing germs even after washing.     Please do not use if you have an allergy to CHG or antibacterial soaps. If your skin becomes reddened/irritated stop using the CHG.  Do not shave (including legs and underarms) for at least 48 hours prior to first CHG shower. It is OK to shave your face.  Please follow these instructions carefully.     Shower the NIGHT BEFORE SURGERY and the MORNING OF SURGERY with CHG Soap.   If you chose to wash your hair, wash your hair first as usual with your normal shampoo. After you shampoo, rinse your hair and body thoroughly to remove the shampoo.  Then [employer] and genitals (private parts) with your normal soap and rinse thoroughly to remove soap.  After that Use CHG Soap as you would any other liquid soap. You can apply CHG directly to the skin and wash  gently with a scrungie or a clean washcloth.   Apply the CHG Soap to your body ONLY FROM THE NECK DOWN.  Do not use on open wounds or open sores. Avoid contact with your eyes, ears, mouth and genitals (private parts). Wash Face and genitals (private parts)  with your normal soap.   Wash thoroughly, paying special attention to the area where your surgery will be performed.  Thoroughly rinse your body with warm water from the neck down.  DO NOT shower/wash with your normal soap after using and rinsing off the CHG Soap.  Pat yourself dry with a CLEAN TOWEL.  Wear CLEAN PAJAMAS to bed the night before surgery  Place CLEAN SHEETS on your bed the night before your surgery  DO NOT SLEEP WITH PETS.  Day of Surgery:  Take a shower with CHG soap. Wear Clean/Comfortable clothing the morning of surgery Do not apply any deodorants/lotions.   Remember to brush your teeth WITH YOUR REGULAR TOOTHPASTE.   Please read over the following fact sheets that you were given.

## 2020-10-07 ENCOUNTER — Encounter (HOSPITAL_COMMUNITY)
Admission: RE | Disposition: A | Discharge status: Home / Self Care | Payer: Self-pay | Source: Home / Self Care | Attending: Neurosurgery

## 2020-10-07 ENCOUNTER — Ambulatory Visit (HOSPITAL_COMMUNITY): Payer: Medicare Other | Admitting: Anesthesiology

## 2020-10-07 ENCOUNTER — Ambulatory Visit (HOSPITAL_COMMUNITY): Payer: Medicare Other | Admitting: Physician Assistant

## 2020-10-07 ENCOUNTER — Observation Stay (HOSPITAL_COMMUNITY)
Admission: RE | Admit: 2020-10-07 | Discharge: 2020-10-08 | Disposition: A | Discharge status: Home / Self Care | Payer: Medicare Other | Attending: Neurosurgery | Admitting: Neurosurgery

## 2020-10-07 ENCOUNTER — Encounter (HOSPITAL_COMMUNITY): Payer: Self-pay | Admitting: Neurosurgery

## 2020-10-07 ENCOUNTER — Other Ambulatory Visit: Payer: Self-pay

## 2020-10-07 ENCOUNTER — Ambulatory Visit (HOSPITAL_COMMUNITY): Payer: Medicare Other

## 2020-10-07 DIAGNOSIS — Z853 Personal history of malignant neoplasm of breast: Secondary | ICD-10-CM | POA: Diagnosis not present

## 2020-10-07 DIAGNOSIS — Z7984 Long term (current) use of oral hypoglycemic drugs: Secondary | ICD-10-CM | POA: Insufficient documentation

## 2020-10-07 DIAGNOSIS — E119 Type 2 diabetes mellitus without complications: Secondary | ICD-10-CM | POA: Diagnosis not present

## 2020-10-07 DIAGNOSIS — M4807 Spinal stenosis, lumbosacral region: Secondary | ICD-10-CM | POA: Diagnosis present

## 2020-10-07 DIAGNOSIS — Z79899 Other long term (current) drug therapy: Secondary | ICD-10-CM | POA: Insufficient documentation

## 2020-10-07 DIAGNOSIS — Z96611 Presence of right artificial shoulder joint: Secondary | ICD-10-CM | POA: Diagnosis not present

## 2020-10-07 DIAGNOSIS — M4727 Other spondylosis with radiculopathy, lumbosacral region: Secondary | ICD-10-CM | POA: Diagnosis not present

## 2020-10-07 DIAGNOSIS — I10 Essential (primary) hypertension: Secondary | ICD-10-CM | POA: Insufficient documentation

## 2020-10-07 DIAGNOSIS — Z96653 Presence of artificial knee joint, bilateral: Secondary | ICD-10-CM | POA: Diagnosis not present

## 2020-10-07 DIAGNOSIS — Z419 Encounter for procedure for purposes other than remedying health state, unspecified: Secondary | ICD-10-CM

## 2020-10-07 DIAGNOSIS — M5416 Radiculopathy, lumbar region: Secondary | ICD-10-CM | POA: Diagnosis present

## 2020-10-07 HISTORY — PX: LUMBAR LAMINECTOMY/DECOMPRESSION MICRODISCECTOMY: SHX5026

## 2020-10-07 LAB — GLUCOSE, CAPILLARY
Glucose-Capillary: 106 mg/dL — ABNORMAL HIGH (ref 70–99)
Glucose-Capillary: 122 mg/dL — ABNORMAL HIGH (ref 70–99)
Glucose-Capillary: 280 mg/dL — ABNORMAL HIGH (ref 70–99)

## 2020-10-07 SURGERY — LUMBAR LAMINECTOMY/DECOMPRESSION MICRODISCECTOMY 2 LEVELS
Anesthesia: General | Site: Back | Laterality: Right

## 2020-10-07 MED ORDER — OXYCODONE HCL 5 MG PO TABS: 5.0000 mg | ORAL_TABLET | Freq: Once | ORAL | Status: DC | PRN

## 2020-10-07 MED ORDER — MENTHOL 3 MG MT LOZG: 1.0000 | LOZENGE | OROMUCOSAL | Status: DC | PRN

## 2020-10-07 MED ORDER — HYDROXYCHLOROQUINE SULFATE 200 MG PO TABS
200.0000 mg | ORAL_TABLET | Freq: Every day | ORAL | Status: DC
  Administered 2020-10-08: 200 mg via ORAL
  Filled 2020-10-07: qty 1

## 2020-10-07 MED ORDER — FENTANYL CITRATE (PF) 250 MCG/5ML IJ SOLN
INTRAMUSCULAR | Status: AC
  Filled 2020-10-07: qty 5

## 2020-10-07 MED ORDER — THROMBIN 5000 UNITS EX SOLR
CUTANEOUS | Status: DC | PRN
  Administered 2020-10-07: 10000 [IU] via TOPICAL

## 2020-10-07 MED ORDER — FENTANYL CITRATE (PF) 250 MCG/5ML IJ SOLN
INTRAMUSCULAR | Status: DC | PRN
  Administered 2020-10-07 (×2): 25 ug via INTRAVENOUS
  Administered 2020-10-07: 75 ug via INTRAVENOUS
  Administered 2020-10-07 (×2): 25 ug via INTRAVENOUS

## 2020-10-07 MED ORDER — BUPIVACAINE HCL (PF) 0.25 % IJ SOLN
INTRAMUSCULAR | Status: AC
  Filled 2020-10-07: qty 30

## 2020-10-07 MED ORDER — ACETAMINOPHEN 650 MG RE SUPP: 650.0000 mg | RECTAL | Status: DC | PRN

## 2020-10-07 MED ORDER — INSULIN ASPART 100 UNIT/ML IJ SOLN
0.0000 [IU] | Freq: Three times a day (TID) | INTRAMUSCULAR | Status: DC
  Administered 2020-10-08: 2 [IU] via SUBCUTANEOUS

## 2020-10-07 MED ORDER — DEXAMETHASONE SODIUM PHOSPHATE 10 MG/ML IJ SOLN
INTRAMUSCULAR | Status: DC | PRN
  Administered 2020-10-07: 10 mg via INTRAVENOUS

## 2020-10-07 MED ORDER — INSULIN ASPART 100 UNIT/ML IJ SOLN
0.0000 [IU] | Freq: Every day | INTRAMUSCULAR | Status: DC
  Administered 2020-10-07: 3 [IU] via SUBCUTANEOUS

## 2020-10-07 MED ORDER — LIDOCAINE 2% (20 MG/ML) 5 ML SYRINGE
INTRAMUSCULAR | Status: DC | PRN
  Administered 2020-10-07: 60 mg via INTRAVENOUS

## 2020-10-07 MED ORDER — KETOROLAC TROMETHAMINE 30 MG/ML IJ SOLN
INTRAMUSCULAR | Status: DC | PRN
  Administered 2020-10-07: 15 mg via INTRAVENOUS

## 2020-10-07 MED ORDER — ROCURONIUM BROMIDE 10 MG/ML (PF) SYRINGE
PREFILLED_SYRINGE | INTRAVENOUS | Status: AC
  Filled 2020-10-07: qty 10

## 2020-10-07 MED ORDER — CHLORHEXIDINE GLUCONATE CLOTH 2 % EX PADS: 6.0000 | MEDICATED_PAD | Freq: Once | CUTANEOUS | Status: DC

## 2020-10-07 MED ORDER — SODIUM CHLORIDE 0.9 % IV SOLN: 250.0000 mL | INTRAVENOUS | Status: DC

## 2020-10-07 MED ORDER — PHENYLEPHRINE HCL-NACL 10-0.9 MG/250ML-% IV SOLN
INTRAVENOUS | Status: DC | PRN
  Administered 2020-10-07: 25 ug/min via INTRAVENOUS

## 2020-10-07 MED ORDER — HYDROCODONE-ACETAMINOPHEN 5-325 MG PO TABS: 1.0000 | ORAL_TABLET | ORAL | Status: DC | PRN

## 2020-10-07 MED ORDER — HEMOSTATIC AGENTS (NO CHARGE) OPTIME
TOPICAL | Status: DC | PRN
Start: 2020-10-07 — End: 2020-10-07
  Administered 2020-10-07: 1 via TOPICAL

## 2020-10-07 MED ORDER — ACETAMINOPHEN 500 MG PO TABS: 1000.0000 mg | ORAL_TABLET | Freq: Once | ORAL | Status: DC

## 2020-10-07 MED ORDER — PROMETHAZINE HCL 25 MG/ML IJ SOLN: 6.2500 mg | INTRAMUSCULAR | Status: DC | PRN

## 2020-10-07 MED ORDER — CHLORHEXIDINE GLUCONATE 0.12 % MT SOLN
15.0000 mL | Freq: Once | OROMUCOSAL | Status: AC
  Administered 2020-10-07: 15 mL via OROMUCOSAL
  Filled 2020-10-07: qty 15

## 2020-10-07 MED ORDER — FOLIC ACID 1 MG PO TABS
1.0000 mg | ORAL_TABLET | Freq: Every day | ORAL | Status: DC
  Administered 2020-10-08: 1 mg via ORAL
  Filled 2020-10-07: qty 1

## 2020-10-07 MED ORDER — THROMBIN 5000 UNITS EX SOLR
CUTANEOUS | Status: AC
  Filled 2020-10-07: qty 10000

## 2020-10-07 MED ORDER — ONDANSETRON HCL 4 MG PO TABS: 4.0000 mg | ORAL_TABLET | Freq: Four times a day (QID) | ORAL | Status: DC | PRN

## 2020-10-07 MED ORDER — FENTANYL CITRATE (PF) 100 MCG/2ML IJ SOLN
25.0000 ug | INTRAMUSCULAR | Status: DC | PRN
  Administered 2020-10-07: 25 ug via INTRAVENOUS

## 2020-10-07 MED ORDER — SODIUM CHLORIDE 0.9% FLUSH: 3.0000 mL | INTRAVENOUS | Status: DC | PRN

## 2020-10-07 MED ORDER — CEFAZOLIN SODIUM-DEXTROSE 1-4 GM/50ML-% IV SOLN
1.0000 g | Freq: Three times a day (TID) | INTRAVENOUS | Status: AC
  Administered 2020-10-07 – 2020-10-08 (×2): 1 g via INTRAVENOUS
  Filled 2020-10-07 (×2): qty 50

## 2020-10-07 MED ORDER — GABAPENTIN 300 MG PO CAPS
300.0000 mg | ORAL_CAPSULE | Freq: Three times a day (TID) | ORAL | Status: DC
  Administered 2020-10-07 – 2020-10-08 (×3): 300 mg via ORAL
  Filled 2020-10-07 (×3): qty 1

## 2020-10-07 MED ORDER — 0.9 % SODIUM CHLORIDE (POUR BTL) OPTIME
TOPICAL | Status: DC | PRN
  Administered 2020-10-07: 1000 mL

## 2020-10-07 MED ORDER — ONDANSETRON HCL 4 MG/2ML IJ SOLN
INTRAMUSCULAR | Status: DC | PRN
  Administered 2020-10-07: 4 mg via INTRAVENOUS

## 2020-10-07 MED ORDER — LACTATED RINGERS IV SOLN: INTRAVENOUS | Status: DC

## 2020-10-07 MED ORDER — PROPOFOL 10 MG/ML IV BOLUS
INTRAVENOUS | Status: DC | PRN
  Administered 2020-10-07: 90 mg via INTRAVENOUS

## 2020-10-07 MED ORDER — PHENOL 1.4 % MT LIQD: 1.0000 | OROMUCOSAL | Status: DC | PRN

## 2020-10-07 MED ORDER — CYCLOBENZAPRINE HCL 10 MG PO TABS
10.0000 mg | ORAL_TABLET | Freq: Three times a day (TID) | ORAL | Status: DC | PRN
  Administered 2020-10-08: 10 mg via ORAL
  Filled 2020-10-07: qty 1

## 2020-10-07 MED ORDER — EPHEDRINE SULFATE-NACL 50-0.9 MG/10ML-% IV SOSY
PREFILLED_SYRINGE | INTRAVENOUS | Status: DC | PRN
  Administered 2020-10-07: 5 mg via INTRAVENOUS

## 2020-10-07 MED ORDER — HYDROCODONE-ACETAMINOPHEN 10-325 MG PO TABS: 1.0000 | ORAL_TABLET | ORAL | Status: DC | PRN

## 2020-10-07 MED ORDER — FENTANYL CITRATE (PF) 100 MCG/2ML IJ SOLN
INTRAMUSCULAR | Status: AC
  Filled 2020-10-07: qty 2

## 2020-10-07 MED ORDER — CEFAZOLIN SODIUM-DEXTROSE 2-4 GM/100ML-% IV SOLN
2.0000 g | INTRAVENOUS | Status: AC
  Administered 2020-10-07: 2 g via INTRAVENOUS
  Filled 2020-10-07: qty 100

## 2020-10-07 MED ORDER — BUPIVACAINE HCL (PF) 0.25 % IJ SOLN
INTRAMUSCULAR | Status: DC | PRN
  Administered 2020-10-07: 20 mL

## 2020-10-07 MED ORDER — OXYCODONE HCL 5 MG/5ML PO SOLN: 5.0000 mg | Freq: Once | ORAL | Status: DC | PRN

## 2020-10-07 MED ORDER — PROPOFOL 10 MG/ML IV BOLUS
INTRAVENOUS | Status: AC
  Filled 2020-10-07: qty 20

## 2020-10-07 MED ORDER — EPHEDRINE 5 MG/ML INJ
INTRAVENOUS | Status: AC
  Filled 2020-10-07: qty 5

## 2020-10-07 MED ORDER — ACETAMINOPHEN 500 MG PO TABS
ORAL_TABLET | ORAL | Status: AC
  Filled 2020-10-07: qty 2

## 2020-10-07 MED ORDER — KETOROLAC TROMETHAMINE 15 MG/ML IJ SOLN
15.0000 mg | Freq: Four times a day (QID) | INTRAMUSCULAR | Status: DC
  Administered 2020-10-07 – 2020-10-08 (×3): 15 mg via INTRAVENOUS
  Filled 2020-10-07 (×3): qty 1

## 2020-10-07 MED ORDER — VITAMIN B-12 1000 MCG PO TABS
1000.0000 ug | ORAL_TABLET | Freq: Every day | ORAL | Status: DC
  Administered 2020-10-08: 1000 ug via ORAL
  Filled 2020-10-07: qty 1

## 2020-10-07 MED ORDER — HYPROMELLOSE (GONIOSCOPIC) 2.5 % OP SOLN: 1.0000 [drp] | Freq: Three times a day (TID) | OPHTHALMIC | Status: DC | PRN

## 2020-10-07 MED ORDER — SUGAMMADEX SODIUM 200 MG/2ML IV SOLN
INTRAVENOUS | Status: DC | PRN
Start: 2020-10-07 — End: 2020-10-07
  Administered 2020-10-07: 150 mg via INTRAVENOUS

## 2020-10-07 MED ORDER — DEXAMETHASONE SODIUM PHOSPHATE 10 MG/ML IJ SOLN
10.0000 mg | Freq: Once | INTRAMUSCULAR | Status: DC
  Filled 2020-10-07: qty 1

## 2020-10-07 MED ORDER — LIDOCAINE 2% (20 MG/ML) 5 ML SYRINGE
INTRAMUSCULAR | Status: AC
  Filled 2020-10-07: qty 15

## 2020-10-07 MED ORDER — LORATADINE 10 MG PO TABS: 10.0000 mg | ORAL_TABLET | Freq: Every day | ORAL | Status: DC

## 2020-10-07 MED ORDER — ORAL CARE MOUTH RINSE: 15.0000 mL | Freq: Once | OROMUCOSAL | Status: AC

## 2020-10-07 MED ORDER — KETOROLAC TROMETHAMINE 30 MG/ML IJ SOLN
INTRAMUSCULAR | Status: AC
  Filled 2020-10-07: qty 1

## 2020-10-07 MED ORDER — PRAVASTATIN SODIUM 40 MG PO TABS
40.0000 mg | ORAL_TABLET | Freq: Every day | ORAL | Status: DC
  Administered 2020-10-07: 40 mg via ORAL
  Filled 2020-10-07: qty 1

## 2020-10-07 MED ORDER — SODIUM CHLORIDE 0.9% FLUSH
3.0000 mL | Freq: Two times a day (BID) | INTRAVENOUS | Status: DC
  Administered 2020-10-07: 3 mL via INTRAVENOUS

## 2020-10-07 MED ORDER — ACETAMINOPHEN 325 MG PO TABS
650.0000 mg | ORAL_TABLET | ORAL | Status: DC | PRN
  Administered 2020-10-07: 650 mg via ORAL
  Filled 2020-10-07: qty 2

## 2020-10-07 MED ORDER — ROCURONIUM BROMIDE 10 MG/ML (PF) SYRINGE
PREFILLED_SYRINGE | INTRAVENOUS | Status: AC
  Filled 2020-10-07: qty 20

## 2020-10-07 MED ORDER — HYDROMORPHONE HCL 1 MG/ML IJ SOLN: 1.0000 mg | INTRAMUSCULAR | Status: DC | PRN

## 2020-10-07 MED ORDER — METFORMIN HCL 500 MG PO TABS
500.0000 mg | ORAL_TABLET | Freq: Every day | ORAL | Status: DC
  Administered 2020-10-08: 500 mg via ORAL
  Filled 2020-10-07: qty 1

## 2020-10-07 MED ORDER — ONDANSETRON HCL 4 MG/2ML IJ SOLN: 4.0000 mg | Freq: Four times a day (QID) | INTRAMUSCULAR | Status: DC | PRN

## 2020-10-07 MED ORDER — ROCURONIUM BROMIDE 10 MG/ML (PF) SYRINGE
PREFILLED_SYRINGE | INTRAVENOUS | Status: DC | PRN
  Administered 2020-10-07: 40 mg via INTRAVENOUS

## 2020-10-07 SURGICAL SUPPLY — 52 items
ADH SKN CLS APL DERMABOND .7 (GAUZE/BANDAGES/DRESSINGS) ×1
APL SKNCLS STERI-STRIP NONHPOA (GAUZE/BANDAGES/DRESSINGS) ×1
BAG COUNTER SPONGE SURGICOUNT (BAG) ×3 IMPLANT
BAG DECANTER FOR FLEXI CONT (MISCELLANEOUS) ×2 IMPLANT
BAG SPNG CNTER NS LX DISP (BAG) ×2
BAND INSRT 18 STRL LF DISP RB (MISCELLANEOUS)
BAND RUBBER #18 3X1/16 STRL (MISCELLANEOUS) ×2 IMPLANT
BENZOIN TINCTURE PRP APPL 2/3 (GAUZE/BANDAGES/DRESSINGS) ×2 IMPLANT
BLADE CLIPPER SURG (BLADE) IMPLANT
BUR CUTTER 7.0 ROUND (BURR) ×2 IMPLANT
CANISTER SUCT 3000ML PPV (MISCELLANEOUS) ×2 IMPLANT
CARTRIDGE OIL MAESTRO DRILL (MISCELLANEOUS) ×1 IMPLANT
DECANTER SPIKE VIAL GLASS SM (MISCELLANEOUS) ×1 IMPLANT
DERMABOND ADVANCED (GAUZE/BANDAGES/DRESSINGS) ×1
DERMABOND ADVANCED .7 DNX12 (GAUZE/BANDAGES/DRESSINGS) ×1 IMPLANT
DIFFUSER DRILL AIR PNEUMATIC (MISCELLANEOUS) ×2 IMPLANT
DRAPE HALF SHEET 40X57 (DRAPES) IMPLANT
DRAPE LAPAROTOMY 100X72X124 (DRAPES) ×2 IMPLANT
DRAPE MICROSCOPE LEICA (MISCELLANEOUS) ×1 IMPLANT
DRAPE SURG 17X23 STRL (DRAPES) ×4 IMPLANT
DRSG OPSITE POSTOP 3X4 (GAUZE/BANDAGES/DRESSINGS) ×1 IMPLANT
DURAPREP 26ML APPLICATOR (WOUND CARE) ×2 IMPLANT
ELECT REM PT RETURN 9FT ADLT (ELECTROSURGICAL) ×2
ELECTRODE REM PT RTRN 9FT ADLT (ELECTROSURGICAL) ×1 IMPLANT
GAUZE 4X4 16PLY ~~LOC~~+RFID DBL (SPONGE) ×1 IMPLANT
GAUZE SPONGE 4X4 12PLY STRL (GAUZE/BANDAGES/DRESSINGS) ×2 IMPLANT
GLOVE EXAM NITRILE XL STR (GLOVE) IMPLANT
GLOVE SURG ENC MOIS LTX SZ6.5 (GLOVE) ×2 IMPLANT
GLOVE SURG LTX SZ9 (GLOVE) ×2 IMPLANT
GLOVE SURG UNDER POLY LF SZ6.5 (GLOVE) ×4 IMPLANT
GOWN STRL REUS W/ TWL LRG LVL3 (GOWN DISPOSABLE) IMPLANT
GOWN STRL REUS W/ TWL XL LVL3 (GOWN DISPOSABLE) ×1 IMPLANT
GOWN STRL REUS W/TWL 2XL LVL3 (GOWN DISPOSABLE) IMPLANT
GOWN STRL REUS W/TWL LRG LVL3 (GOWN DISPOSABLE) ×2
GOWN STRL REUS W/TWL XL LVL3 (GOWN DISPOSABLE) ×2
KIT BASIN OR (CUSTOM PROCEDURE TRAY) ×2 IMPLANT
KIT TURNOVER KIT B (KITS) ×2 IMPLANT
NDL SPNL 22GX3.5 QUINCKE BK (NEEDLE) IMPLANT
NEEDLE HYPO 22GX1.5 SAFETY (NEEDLE) ×2 IMPLANT
NEEDLE SPNL 22GX3.5 QUINCKE BK (NEEDLE) IMPLANT
NS IRRIG 1000ML POUR BTL (IV SOLUTION) ×2 IMPLANT
OIL CARTRIDGE MAESTRO DRILL (MISCELLANEOUS) ×2
PACK LAMINECTOMY NEURO (CUSTOM PROCEDURE TRAY) ×2 IMPLANT
PAD ARMBOARD 7.5X6 YLW CONV (MISCELLANEOUS) ×6 IMPLANT
SPONGE SURGIFOAM ABS GEL SZ50 (HEMOSTASIS) ×2 IMPLANT
SPONGE T-LAP 4X18 ~~LOC~~+RFID (SPONGE) ×1 IMPLANT
STRIP CLOSURE SKIN 1/2X4 (GAUZE/BANDAGES/DRESSINGS) ×2 IMPLANT
SUT VIC AB 2-0 CT1 18 (SUTURE) ×2 IMPLANT
SUT VIC AB 3-0 SH 8-18 (SUTURE) ×2 IMPLANT
TOWEL GREEN STERILE (TOWEL DISPOSABLE) ×2 IMPLANT
TOWEL GREEN STERILE FF (TOWEL DISPOSABLE) ×2 IMPLANT
WATER STERILE IRR 1000ML POUR (IV SOLUTION) ×2 IMPLANT

## 2020-10-07 NOTE — Transfer of Care (Signed)
Immediate Anesthesia Transfer of Care Note  Patient: Ashley Bray Grand  Procedure(s) Performed: Laminectomy and Foraminotomy - right - Lumbar Four-Lumbar Five - Lumbar Five-Sacral One (Right: Back)  Patient Location: PACU  Anesthesia Type:General  Level of Consciousness: awake, alert  and oriented  Airway & Oxygen Therapy: Patient Spontanous Breathing  Post-op Assessment: Report given to RN and Post -op Vital signs reviewed and stable  Post vital signs: Reviewed and stable  Last Vitals:  Vitals Value Taken Time  BP 155/77 10/07/20 1420  Temp    Pulse 70 10/07/20 1421  Resp 15 10/07/20 1421  SpO2 93 % 10/07/20 1421  Vitals shown include unvalidated device data.  Last Pain:  Vitals:   10/07/20 1039  TempSrc:   PainSc: 5          Complications: No notable events documented.

## 2020-10-07 NOTE — Anesthesia Postprocedure Evaluation (Signed)
Anesthesia Post Note  Patient: Ashley Bray  Procedure(s) Performed: Laminectomy and Foraminotomy - right - Lumbar Four-Lumbar Five - Lumbar Five-Sacral One (Right: Back)     Patient location during evaluation: PACU Anesthesia Type: General Level of consciousness: awake and alert and oriented Pain management: pain level controlled Vital Signs Assessment: post-procedure vital signs reviewed and stable Respiratory status: spontaneous breathing, nonlabored ventilation and respiratory function stable Cardiovascular status: blood pressure returned to baseline Postop Assessment: no apparent nausea or vomiting Anesthetic complications: no   No notable events documented.  Last Vitals:  Vitals:   10/07/20 1450 10/07/20 1520  BP: (!) 173/75 (!) 165/75  Pulse: 66 66  Resp: 15 16  Temp: 36.4 C   SpO2: 100% 100%    Last Pain:  Vitals:   10/07/20 1450  TempSrc:   PainSc: [name]

## 2020-10-07 NOTE — Anesthesia Procedure Notes (Signed)
Procedure Name: Intubation Date/Time: 10/07/2020 12:40 PM Performed by: Carolan Clines, CRNA Pre-anesthesia Checklist: Patient identified, Emergency Drugs available, Suction available and Patient being monitored Patient Re-evaluated:Patient Re-evaluated prior to induction Oxygen Delivery Method: Circle System Utilized Preoxygenation: Pre-oxygenation with 100% oxygen Induction Type: IV induction Ventilation: Mask ventilation without difficulty and Oral airway inserted - appropriate to patient size Laryngoscope Size: Glidescope and 3 Grade View: Grade I Tube type: Oral Tube size: 7.0 mm Number of attempts: 1 Airway Equipment and Method: Rigid stylet, Oral airway and Video-laryngoscopy Placement Confirmation: ETT inserted through vocal cords under direct vision, positive ETCO2 and breath sounds checked- equal and bilateral Secured at: 22 cm Tube secured with: Tape Dental Injury: Teeth and Oropharynx as per pre-operative assessment  Difficulty Due To: Difficult Airway- due to reduced neck mobility and Difficulty was anticipated Comments: Elective glidescope intubation d/t reduced neck mobility.

## 2020-10-07 NOTE — Anesthesia Preprocedure Evaluation (Addendum)
Anesthesia Evaluation  Patient identified by MRN, date of birth, ID band Patient awake    Reviewed: Allergy & Precautions, NPO status , Patient's Chart, lab work & pertinent test results  History of Anesthesia Complications (+) PONV and history of anesthetic complications  Airway Mallampati: II  TM Distance: >3 FB Neck ROM: Full    Dental no notable dental hx.    Pulmonary neg pulmonary ROS,    Pulmonary exam normal        Cardiovascular hypertension, Normal cardiovascular exam     Neuro/Psych Lumbar stenosis negative psych ROS   GI/Hepatic Neg liver ROS, GERD  ,  Endo/Other  diabetes, Type 2, Oral Hypoglycemic Agents  Renal/GU negative Renal ROS  negative genitourinary   Musculoskeletal  (+) Arthritis ,   Abdominal   Peds  Hematology negative hematology ROS (+)   Anesthesia Other Findings Day of surgery medications reviewed with patient.  Reproductive/Obstetrics negative OB ROS                            Anesthesia Physical Anesthesia Plan  ASA: 2  Anesthesia Plan: General   Post-op Pain Management:    Induction: Intravenous  PONV Risk Score and Plan: 4 or greater and Treatment may vary due to age or medical condition, Ondansetron, Dexamethasone and Propofol infusion  Airway Management Planned: Oral ETT  Additional Equipment: None  Intra-op Plan:   Post-operative Plan: Extubation in OR  Informed Consent: I have reviewed the patients History and Physical, chart, labs and discussed the procedure including the risks, benefits and alternatives for the proposed anesthesia with the patient or authorized representative who has indicated his/her understanding and acceptance.     Dental advisory given  Plan Discussed with: CRNA  Anesthesia Plan Comments:        Anesthesia Quick Evaluation

## 2020-10-07 NOTE — Brief Op Note (Signed)
10/07/2020  1:57 PM  PATIENT:  Ashley Bray  80 y.o. female  PRE-OPERATIVE DIAGNOSIS:  Stenosis  POST-OPERATIVE DIAGNOSIS:  Stenosis  PROCEDURE:  Procedure(s) with comments: Laminectomy and Foraminotomy - right - Lumbar Four-Lumbar Five - Lumbar Five-Sacral One (Right) - Laminectomy and Foraminotomy - right - Lumbar Four-Lumbar Five - Lumbar Five-Sacral One  SURGEON:  Surgeon(s) and Role:    * Earnie Larsson, MD - Primary  PHYSICIAN ASSISTANT:   ASSISTANTSMearl Latin  ANESTHESIA:   general  EBL:  25 mL   BLOOD ADMINISTERED:none  DRAINS: none   LOCAL MEDICATIONS USED:  MARCAINE     SPECIMEN:  No Specimen  DISPOSITION OF SPECIMEN:  N/A  COUNTS:  YES  TOURNIQUET:  * No tourniquets in log *  DICTATION: .Dragon Dictation  PLAN OF CARE: Admit for overnight observation  PATIENT DISPOSITION:  PACU - hemodynamically stable.   Delay start of Pharmacological VTE agent (>24hrs) due to surgical blood loss or risk of bleeding: yes

## 2020-10-07 NOTE — Op Note (Signed)
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  Asst. Surgeon: Reinaldo Meeker, NP  Anesthesia: General  Indication: 80 year old female with right lower extremity radicular pain failing conservative manage.  Work-up demonstrates evidence of multilevel lumbar degenerative disease with significant lateral recess and foraminal stenosis at L4-5 and L5-S1 on the right.  Patient presents now for decompressive surgery in hopes of improving her symptoms.  Operative note: After induction anesthesia, patient position prone on the Wilson frame and properly padded.  Lumbar region prepped and draped sterilely.  Incision made overlying L5-S1.  Dissection performed on the right.  Retractor placed.  X-ray taken.  Level confirmed.  Laminotomy was then performed both at L4-5 and L5-S1 using high-speed drill and Kerrison rongeurs to remove the inferior aspect lamina of L4 and L5 the medial aspect of the L4-5 and L5-S1 facet joints and the superior rim of the L5 and the S1 lamina's.  Ligament flavum elevated and resected.  Foraminotomies completed on the course the exiting L4-L5 and S1 nerve roots on the right side.  To space was examined.  The spaces were bulging but not frankly torn or herniated.  Wound was then irrigated.  Gelfoam was placed topically for hemostasis.  Wounds were closed in layers with Vicryl sutures.  Steri-Strips and sterile dressing were applied.  No apparent complications.  Patient tolerated the procedure well and she returns to the recovery room postop.

## 2020-10-07 NOTE — H&P (Signed)
Ashley Bray is an 80 y.o. female.   Chief Complaint: Leg pain HPI: 80 year old female with lower back pain radiating to her right lower extremity with associated numbness.  Work-up demonstrates evidence of significant spondylosis with lateral recess and foraminal stenosis at L4-5 and L5-S1 on the right.  Patient presents now for decompressive surgery in hopes of improving her symptoms.  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[facility])    Wears dentures    partial upper    Past Surgical History:  Procedure Laterality Date   ABDOMINAL HYSTERECTOMY  1991   ANTERIOR CERVICAL DECOMP/DISCECTOMY FUSION N/A 03/16/2015   Procedure: ANTERIOR  CERVICAL DECOMPRESSION FUSION CERVICAL TWO-THREE,CERVICAL THREE-FOUR WITH REMOVAL OF HARDWARE AT CERVICAL FOUR-FIVE.;  Surgeon: Earnie Larsson, MD;  Location: [facility];  Service: Neurosurgery;  Laterality: N/A;   APPENDECTOMY     BREAST BIOPSY Left 08/31/2013   positive   BREAST LUMPECTOMY Left 10/06/2013 & 11/03/2013   CARPAL TUNNEL RELEASE Bilateral    CATARACT EXTRACTION W/PHACO Left 05/13/2018   Procedure: CATARACT EXTRACTION PHACO AND INTRAOCULAR LENS PLACEMENT ([place]) LEFT;  Surgeon: Birder Robson, MD;  Location: [facility] ORS;  Service: Ophthalmology;  Laterality: Left;  [place]  00:32 CDE 3.76 Fluid pack lot # [id number]   CATARACT EXTRACTION W/PHACO Right 08/05/2018   Procedure: CATARACT EXTRACTION PHACO AND INTRAOCULAR LENS PLACEMENT ([facility]) RIGHT;  Surgeon: Birder Robson, MD;   Location: [facility];  Service: Ophthalmology;  Laterality: Right;  PT WOULD LIKE TO BE FIRST CASE Diabetic - oal meds   COLONOSCOPY N/A 01/19/2020   Procedure: COLONOSCOPY;  Surgeon: Lesly Rubenstein, MD;  Location: [facility];  Service: Endoscopy;  Laterality: N/A;   JOINT REPLACEMENT Bilateral    knees   JOINT REPLACEMENT Right    shoulder   neck fusion     NECK SURGERY  10/2016   REPLACEMENT TOTAL KNEE BILATERAL     x2   right shoulder replacement Right    ROTATOR CUFF REPAIR Right     Family History  Problem Relation Age of Onset   Cancer Mother        kidney or bladder-unsure of which one   Cancer Brother 36       lung   Cancer Brother 45       lung   Breast cancer Neg Hx    Social History:  reports that she has never smoked. She has never used smokeless tobacco. She reports current alcohol use of about 2.0 standard drinks of alcohol per week. She reports that she does not use drugs.  Allergies:  Allergies  Allergen Reactions   Codeine Nausea And Vomiting    Medications Prior to Admission  Medication Sig Dispense Refill   acetaminophen (TYLENOL) 500 MG tablet Take 1,000 mg by mouth every 6 (six) hours as needed for moderate pain or headache.     folic acid (FOLVITE) 1  MG tablet Take 1 mg by mouth daily.  11   gabapentin (NEURONTIN) 300 MG capsule Take 300 mg by mouth 3 (three) times daily.     GLUCAGON EMERGENCY 1 MG injection Inject 1 mg into the muscle once as needed (low blood sugar).   0   hydroxychloroquine (PLAQUENIL) 200 MG tablet Take 200 mg by mouth daily.     hydroxypropyl methylcellulose / hypromellose (ISOPTO TEARS / GONIOVISC) 2.5 % ophthalmic solution Place 1 drop into both eyes 3 (three) times daily as needed for dry eyes.     metFORMIN (GLUCOPHAGE) 500 MG tablet Take 500 mg by mouth daily.      methotrexate (RHEUMATREX) 2.5 MG tablet Take 7.5 mg by mouth every Thursday. Caution:Chemotherapy. Protect from light.     pravastatin  (PRAVACHOL) 40 MG tablet Take 40 mg by mouth at bedtime.      vitamin B-12 (CYANOCOBALAMIN) 1000 MCG tablet Take 1,000 mcg by mouth daily.     Blood Glucose Monitoring Suppl (ONE TOUCH ULTRA 2) W/DEVICE KIT   11   cetirizine (ZYRTEC ALLERGY) 10 MG tablet Take 1 tablet (10 mg total) by mouth daily. (Patient taking differently: Take 10 mg by mouth daily as needed for allergies.) 30 tablet 12    Results for orders placed or performed during the hospital encounter of 10/07/20 (from the past 48 hour(s))  Glucose, capillary     Status: Abnormal   Collection Time: 10/07/20 10:11 AM  Result Value Ref Range   Glucose-Capillary 106 (H) 70 - 99 mg/dL    Comment: Glucose reference range applies only to samples taken after fasting for at least 8 hours.   Comment 1 Notify RN    Comment 2 Document in Chart    No results found.  Pertinent items noted in HPI and remainder of comprehensive ROS otherwise negative.  Blood pressure (!) 183/75, pulse 70, temperature 97.7 F (36.5 C), temperature source Oral, resp. rate 18, height 5' (1.524 m), weight 63.7 kg, SpO2 100 %.  Patient is awake and alert.  Oriented and appropriate.  Speech is fluent.  Judgment insight are intact.  Cranial nerve function normal bilateral.  Motor examination reveals him intact motor strength bilateral.  Sensory examination decreased sensation pinprick light touch in her right L5 dermatome.  Deep intervals normal active.  No evidence of long track signs.  Gait antalgic.  Posture is normal peer examination head ears eyes nose throat is unremarkable chest and abdomen benign.  Extremities are free from injury deformity. 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  Plan right L4-5 and L5-S1 decompressive laminotomy and foraminotomies.  Risks and benefits of been explained.  Patient wishes to proceed.  Mallie Mussel A Tarina Volk 10/07/2020, 11:53 AM

## 2020-10-08 ENCOUNTER — Encounter (HOSPITAL_COMMUNITY): Payer: Self-pay | Admitting: Neurosurgery

## 2020-10-08 DIAGNOSIS — M4807 Spinal stenosis, lumbosacral region: Secondary | ICD-10-CM | POA: Diagnosis not present

## 2020-10-08 LAB — GLUCOSE, CAPILLARY: Glucose-Capillary: 146 mg/dL — ABNORMAL HIGH (ref 70–99)

## 2020-10-08 MED ORDER — HYDROCODONE-ACETAMINOPHEN 5-325 MG PO TABS: 1.0000 | ORAL_TABLET | ORAL | 0 refills | Status: DC | PRN

## 2020-10-08 MED ORDER — CYCLOBENZAPRINE HCL 10 MG PO TABS: 10.0000 mg | ORAL_TABLET | Freq: Three times a day (TID) | ORAL | 0 refills | Status: DC | PRN

## 2020-10-08 NOTE — Discharge Instructions (Addendum)
Wound Care Keep incision covered and dry for two days.  Do not put any creams, lotions, or ointments on incision. Leave steri-strips on back.  They will fall off by themselves. Activity Walk each and every day, increasing distance each day. No lifting greater than 5 lbs.  Avoid excessive neck motion. No driving for 2 weeks; may ride as a passenger locally. If provided with back brace, wear when out of bed.  It is not necessary to wear brace in bed. Diet Resume your normal diet.  Return to Work Will be discussed at you follow up appointment. Call Your Doctor If Any of These Occur Redness, drainage, or swelling at the wound.  Temperature greater than 101 degrees. Severe pain not relieved by pain medication. Incision starts to come apart. Follow Up Appt Call  [phone])  for problems.  If you have any hardware placed in your spine, you will need an x-ray before your appointment.

## 2020-10-08 NOTE — Progress Notes (Signed)
Patient was transported via wheelchair by RN for discharge home; in no acute distress nor complaints of pain nor discomfort; room was checked for all her belongings and everything was accounted for; discharge instructions given to patient RN and she verbalized understanding on the instructions given.

## 2020-10-08 NOTE — Evaluation (Signed)
Occupational Therapy Evaluation Patient Details Name: Ashley Bray MRN: [id number] DOB: [birth year] Today's Date: 10/08/2020    History of Present Illness 80 yo admitted 7/22 for L4-S1 laminectomy due to back and RLE pain and numbness. PMhx: RA, breast CA, DM, GERD, HLD, HTN   Clinical Impression   Patient is s/p  L4-S1 laminectomy surgery resulting in the deficits listed below (see OT Problem List).  Pt requires set up with UE and set up to supervision for LE ADLS to be able to follow precautions. Pt requires supervision to min guard with SPC for ambulation in room level. Patient will benefit from skilled OT to increase their safety and independence with ADL and functional mobility for ADL (while adhering to their precautions) to facilitate discharge to venue listed below.      Follow Up Recommendations  Supervision - Intermittent;Follow surgeon's recommendation for DC plan and follow-up therapies    Equipment Recommendations       Recommendations for Other Services       Precautions / Restrictions Precautions Precautions: Back;Fall Precaution Booklet Issued: Yes (comment) Restrictions Weight Bearing Restrictions: No      Mobility Bed Mobility Overal bed mobility: Needs Assistance Bed Mobility: Rolling;Supine to Sit Rolling: Supervision (cues for precautions)   Supine to sit: Supervision;HOB elevated          Transfers Overall transfer level: Needs assistance   Transfers: Sit to/from Stand Sit to Stand: Supervision         General transfer comment: pt cued for placement of SPC    Balance Overall balance assessment: Mild deficits observed, not formally tested                                         ADL either performed or assessed with clinical judgement   ADL Overall ADL's : Needs assistance/impaired Eating/Feeding: Independent;Sitting   Grooming: Wash/dry hands;Wash/dry face;Supervision/safety;Cueing for safety;Cueing for sequencing    Upper Body Bathing: Set up;Sitting   Lower Body Bathing: Set up;Sit to/from stand   Upper Body Dressing : Set up;Sitting   Lower Body Dressing: Set up;Sit to/from stand   Toilet Transfer: Supervision/safety;Regular Toilet;Grab bars   Toileting- Water quality scientist and Hygiene: Supervision/safety;Cueing for safety;Cueing for sequencing;Sit to/from stand   Tub/ Shower Transfer: Min guard;Cueing for safety;Cueing for sequencing   Functional mobility during ADLs: Cueing for safety;Cueing for sequencing;Min guard;Cane       Vision Baseline Vision/History: Wears glasses Wears Glasses: Reading only Patient Visual Report: No change from baseline       Perception     Praxis      Pertinent Vitals/Pain Pain Assessment: 0-10 Pain Score: 2  Pain Location: sx site Pain Descriptors / Indicators: Aching Pain Intervention(s): Limited activity within patient's tolerance     Hand Dominance     Extremity/Trunk Assessment Upper Extremity Assessment Upper Extremity Assessment: RUE deficits/detail RUE Deficits / Details: rotator cuff repair   Lower Extremity Assessment Lower Extremity Assessment: Defer to PT evaluation   Cervical / Trunk Assessment Cervical / Trunk Assessment: Kyphotic   Communication Communication Communication: No difficulties   Cognition Arousal/Alertness: Awake/alert Behavior During Therapy: WFL for tasks assessed/performed Overall Cognitive Status: Within Functional Limits for tasks assessed  General Comments       Exercises     Shoulder Instructions      Home Living Family/patient expects to be discharged to:: Private residence Living Arrangements: Spouse/significant other   Type of Home: House Home Access: Stairs to enter [employer] of Steps: front 6 steps with B rails back has a flight of stairs with rail on R side   Home Layout: One level     Bathroom Shower/Tub: Emergency planning/management officer: Standard Bathroom Accessibility: Yes How Accessible: Accessible via walker Home Equipment: Shower seat;Cane - single point;Bedside commode;Grab bars - tub/shower          Prior Functioning/Environment Level of Independence: Needs assistance  Gait / Transfers Assistance Needed: use of SPC due to pain ADL's / Homemaking Assistance Needed: husband complete meal prep            OT Problem List: Decreased strength;Decreased range of motion;Decreased activity tolerance;Impaired balance (sitting and/or standing);Decreased knowledge of use of DME or AE;Decreased safety awareness;Decreased knowledge of precautions;Pain      OT Treatment/Interventions: Self-care/ADL training;DME and/or AE instruction;Therapeutic activities;Patient/family education;Balance training    OT Goals(Current goals can be found in the care plan section) Acute Rehab OT Goals Patient Stated Goal: to return home OT Goal Formulation: With patient Time For Goal Achievement: 10/15/20 Potential to Achieve Goals: Good  OT Frequency: Min 2X/week   Barriers to D/C:            Co-evaluation              AM-PAC OT "6 Clicks" Daily Activity     Outcome Measure Help from another person eating meals?: None Help from another person taking care of personal grooming?: None Help from another person toileting, which includes using toliet, bedpan, or urinal?: None Help from another person bathing (including washing, rinsing, drying)?: A Little Help from another person to put on and taking off regular upper body clothing?: None Help from another person to put on and taking off regular lower body clothing?: A Little 6 Click Score: 22   End of Session Equipment Utilized During Treatment: Gait belt  Activity Tolerance: Patient tolerated treatment well Patient left: in chair;with call bell/phone within reach  OT Visit Diagnosis: Unsteadiness on feet (R26.81);Other abnormalities of gait and  mobility (R26.89);Pain Pain - Right/Left:  (sx site) Pain - part of body:  (back)                Time: [id number] OT Time Calculation (min): 32 min Charges:  OT General Charges $OT Visit: 1 Visit OT Evaluation $OT Eval Low Complexity: 1 Low OT Treatments $Self Care/Home Management : 8-22 mins  Joeseph Amor OTR/L  Acute Rehab Services  [phone] office number [phone] pager number   Joeseph Amor 10/08/2020, 9:26 AM

## 2020-10-08 NOTE — Evaluation (Addendum)
Physical Therapy Evaluation Patient Details Name: Ashley Bray MRN: [id number] DOB: 1940/12/26 Today's Date: 10/08/2020   History of Present Illness  80 yo admitted 7/22 for L4-S1 laminectomy due to back and RLE pain and numbness. PMhx: RA, breast CA, DM, GERD, HLD, HTN  Clinical Impression  Pt pleasant and able to state 2/3 precautions. Pt with report of several recent falls with decreased RLE strength and pain who reports pain currently only at right knee. Pt with decreased strength, posture, transfers and gait who will benefit from acute therapy to maximize gait and independence. Pt educated for all precautions and mobility with spouse present.      Follow Up Recommendations Outpatient PT    Equipment Recommendations  Rolling walker with 5" wheels (youth)    Recommendations for Other Services       Precautions / Restrictions Precautions Precautions: Back;Fall Precaution Booklet Issued: Yes (comment) Precaution Comments: pt able to recall precautions with education for all Restrictions Weight Bearing Restrictions: No      Mobility  Bed Mobility Overal bed mobility: Needs Assistance Bed Mobility: Rolling;Sidelying to Sit;Sit to Sidelying Rolling: Supervision Sidelying to sit: Supervision Supine to sit: Supervision;HOB elevated   Sit to sidelying: Supervision General bed mobility comments: pt with cues for precautions and sequence    Transfers Overall transfer level: Modified independent   Transfers: Sit to/from Stand   Ambulation/Gait Ambulation/Gait assistance: Supervision Gait Distance (Feet): 400 Feet Assistive device: Rolling walker (2 wheeled) Gait Pattern/deviations: Step-through pattern;Decreased stride length;Trunk flexed   Gait velocity interpretation: >2.62 ft/sec, indicative of community ambulatory General Gait Details: cues for posture and proximity to RW  Stairs Stairs: Yes Stairs assistance: Min assist Stair Management: Step to  pattern;Forwards;One rail Right Number of Stairs: 11 General stair comments: pt able to ascend with step to pattern with right rail and increased time. Descent with HHA as well as Rail with step to pattern and pt cued to continue rail and cane use for stairs at home with spouse moving RW  Wheelchair Mobility    Modified Rankin (Stroke Patients Only)       Balance Overall balance assessment: Needs assistance Sitting-balance support: No upper extremity supported;Feet supported Sitting balance-Leahy Scale: Good     Standing balance support: No upper extremity supported Standing balance-Leahy Scale: Fair Standing balance comment: pt able to stand and walk limited distance without RW with significant improvement with bil UE support on RW                             Pertinent Vitals/Pain Pain Assessment: 0-10 Pain Score: 2  Pain Location: right knee Pain Descriptors / Indicators: Aching;Guarding Pain Intervention(s): Limited activity within patient's tolerance;Monitored during session;Repositioned    Home Living Family/patient expects to be discharged to:: Private residence Living Arrangements: Spouse/significant other Available Help at Discharge: Family;Available 24 hours/day Type of Home: House Home Access: Stairs to enter Entrance Stairs-Rails: Right Entrance Stairs-Number of Steps: 14 Home Layout: One level Home Equipment: Shower seat;Cane - single point;Bedside commode;Grab bars - tub/shower      Prior Function Level of Independence: Needs assistance   Gait / Transfers Assistance Needed: use of SPC due to pain  ADL's / Homemaking Assistance Needed: husband complete meal prep        Hand Dominance        Extremity/Trunk Assessment   Upper Extremity Assessment Upper Extremity Assessment: Generalized weakness RUE Deficits / Details: rotator cuff repair    Lower  Extremity Assessment Lower Extremity Assessment: Generalized weakness    Cervical /  Trunk Assessment Cervical / Trunk Assessment: Kyphotic  Communication   Communication: No difficulties  Cognition Arousal/Alertness: Awake/alert Behavior During Therapy: WFL for tasks assessed/performed Overall Cognitive Status: Impaired/Different from baseline Area of Impairment: Memory                     Memory: Decreased short-term memory                General Comments      Exercises     Assessment/Plan    PT Assessment Patient needs continued PT services  PT Problem List Decreased mobility;Decreased activity tolerance;Decreased balance;Decreased knowledge of use of DME;Decreased knowledge of precautions       PT Treatment Interventions Gait training;Functional mobility training;Therapeutic activities;Patient/family education;Stair training;Therapeutic exercise;DME instruction    PT Goals (Current goals can be found in the Care Plan section)  Acute Rehab PT Goals Patient Stated Goal: to return home PT Goal Formulation: With patient/family Time For Goal Achievement: 10/15/20 Potential to Achieve Goals: Good    Frequency Min 5X/week   Barriers to discharge        Co-evaluation               AM-PAC PT "6 Clicks" Mobility  Outcome Measure Help needed turning from your back to your side while in a flat bed without using bedrails?: A Little Help needed moving from lying on your back to sitting on the side of a flat bed without using bedrails?: A Little Help needed moving to and from a bed to a chair (including a wheelchair)?: A Little Help needed standing up from a chair using your arms (e.g., wheelchair or bedside chair)?: A Little Help needed to walk in hospital room?: A Little Help needed climbing 3-5 steps with a railing? : A Little 6 Click Score: 18    End of Session   Activity Tolerance: Patient tolerated treatment well Patient left: in chair;with call bell/phone within reach;with family/visitor present Nurse Communication: Mobility  status PT Visit Diagnosis: Other abnormalities of gait and mobility (R26.89);Difficulty in walking, not elsewhere classified (R26.2)    Time: [id number] PT Time Calculation (min) (ACUTE ONLY): 29 min   Charges:   PT Evaluation $PT Eval Moderate Complexity: 1 Mod PT Treatments $Therapeutic Activity: 8-22 mins        Allyn Bartelson P, PT Acute Rehabilitation Services Pager: [phone] Office: [phone]   Sandy Salaam Kirin Pastorino 10/08/2020, 9:52 AM

## 2020-10-08 NOTE — Discharge Summary (Signed)
Physician Discharge Summary  Patient ID: Ashley Bray MRN: [id number] DOB/AGE: 20-Oct-1940 80 y.o.  Admit date: 10/07/2020 Discharge date: 10/08/2020  Admission Diagnoses: Spinal stenosis, lumbar radiculopathy  Discharge Diagnoses: Spinal stenosis, lumbar radiculopathy Active Problems:   Lumbar radiculopathy   Discharged Condition: good  Hospital Course: Patient was admitted to undergo surgery which he tolerated well.  Consults: None  Significant Diagnostic Studies: None  Treatments: surgery: See op note  Discharge Exam: Blood pressure 124/87, pulse 72, temperature 97.6 F (36.4 C), temperature source Oral, resp. rate 18, height 5' (1.524 m), weight 63.7 kg, SpO2 100 %. Incision is clean and dry motor function is intact.  Disposition: Discharge disposition: 01-Home or Self Care       Discharge Instructions     Call MD for:  redness, tenderness, or signs of infection (pain, swelling, redness, odor or green/yellow discharge around incision site)   Complete by: As directed    Call MD for:  severe uncontrolled pain   Complete by: As directed    Call MD for:  temperature >100.4   Complete by: As directed    Diet - low sodium heart healthy   Complete by: As directed    Discharge wound care:   Complete by: As directed    Per nursing staff   Increase activity slowly   Complete by: As directed       Allergies as of 10/08/2020       Reactions   Codeine Nausea And Vomiting        Medication List     TAKE these medications    acetaminophen 500 MG tablet Commonly known as: TYLENOL Take 1,000 mg by mouth every 6 (six) hours as needed for moderate pain or headache.   cetirizine 10 MG tablet Commonly known as: ZyrTEC Allergy Take 1 tablet (10 mg total) by mouth daily. What changed:  when to take this reasons to take this   cyclobenzaprine 10 MG tablet Commonly known as: FLEXERIL Take 1 tablet (10 mg total) by mouth 3 (three) times daily as needed for  muscle spasms.   folic acid 1 MG tablet Commonly known as: FOLVITE Take 1 mg by mouth daily.   gabapentin 300 MG capsule Commonly known as: NEURONTIN Take 300 mg by mouth 3 (three) times daily.   Glucagon Emergency 1 MG Kit Inject 1 mg into the muscle once as needed (low blood sugar).   HYDROcodone-acetaminophen 5-325 MG tablet Commonly known as: NORCO/VICODIN Take 1 tablet by mouth every 4 (four) hours as needed for moderate pain ((score 4 to 6)).   hydroxychloroquine 200 MG tablet Commonly known as: PLAQUENIL Take 200 mg by mouth daily.   hydroxypropyl methylcellulose / hypromellose 2.5 % ophthalmic solution Commonly known as: ISOPTO TEARS / GONIOVISC Place 1 drop into both eyes 3 (three) times daily as needed for dry eyes.   metFORMIN 500 MG tablet Commonly known as: GLUCOPHAGE Take 500 mg by mouth daily.   methotrexate 2.5 MG tablet Commonly known as: RHEUMATREX Take 7.5 mg by mouth every Thursday. Caution:Chemotherapy. Protect from light.   ONE TOUCH ULTRA 2 w/Device Kit   pravastatin 40 MG tablet Commonly known as: PRAVACHOL Take 40 mg by mouth at bedtime.   vitamin B-12 1000 MCG tablet Commonly known as: CYANOCOBALAMIN Take 1,000 mcg by mouth daily.               Discharge Care Instructions  (From admission, onward)           Start  Ordered   10/08/20 0000  Discharge wound care:       Comments: Per nursing staff   10/08/20 [phone]            Follow-up Information     Earnie Larsson, MD. Call.   Specialty: Neurosurgery Why: If symptoms worsen Contact information: [address] [place] [address] [place] [address] [phone]                 Signed: Earleen Newport 10/08/2020, 8:46 AM

## 2020-11-10 ENCOUNTER — Other Ambulatory Visit: Payer: Self-pay | Admitting: Internal Medicine

## 2020-11-10 DIAGNOSIS — Z1231 Encounter for screening mammogram for malignant neoplasm of breast: Secondary | ICD-10-CM

## 2020-11-23 ENCOUNTER — Other Ambulatory Visit: Payer: Self-pay

## 2020-11-23 ENCOUNTER — Ambulatory Visit
Admission: RE | Admit: 2020-11-23 | Discharge: 2020-11-23 | Disposition: A | Discharge status: Home / Self Care | Payer: Medicare Other | Source: Ambulatory Visit | Attending: Internal Medicine | Admitting: Internal Medicine

## 2020-11-23 DIAGNOSIS — Z1231 Encounter for screening mammogram for malignant neoplasm of breast: Secondary | ICD-10-CM | POA: Diagnosis present

## 2021-06-09 ENCOUNTER — Other Ambulatory Visit: Payer: Self-pay | Admitting: Student

## 2021-06-09 DIAGNOSIS — M5412 Radiculopathy, cervical region: Secondary | ICD-10-CM

## 2021-06-14 ENCOUNTER — Other Ambulatory Visit: Payer: Medicare Other

## 2021-06-15 ENCOUNTER — Ambulatory Visit
Admission: RE | Admit: 2021-06-15 | Discharge: 2021-06-15 | Disposition: A | Discharge status: Home / Self Care | Payer: Medicare Other | Source: Ambulatory Visit | Attending: Student | Admitting: Student

## 2021-06-15 DIAGNOSIS — M5412 Radiculopathy, cervical region: Secondary | ICD-10-CM

## 2021-06-16 ENCOUNTER — Other Ambulatory Visit: Payer: Medicare Other

## 2021-07-06 ENCOUNTER — Other Ambulatory Visit: Payer: Self-pay | Admitting: Neurosurgery

## 2021-07-06 DIAGNOSIS — M4802 Spinal stenosis, cervical region: Secondary | ICD-10-CM

## 2021-07-07 ENCOUNTER — Ambulatory Visit
Admission: RE | Admit: 2021-07-07 | Discharge: 2021-07-07 | Disposition: A | Discharge status: Home / Self Care | Payer: Medicare Other | Source: Ambulatory Visit | Attending: Neurosurgery | Admitting: Neurosurgery

## 2021-07-07 DIAGNOSIS — M4802 Spinal stenosis, cervical region: Secondary | ICD-10-CM

## 2021-07-20 ENCOUNTER — Other Ambulatory Visit: Payer: Self-pay | Admitting: Neurosurgery

## 2021-07-26 NOTE — Progress Notes (Signed)
Surgical Instructions ? ? ? Your procedure is scheduled on 07/31/21. ? [facility] at 6:00 A.M., then check in with the Admitting office. ? Call this number if you have problems the morning of surgery: ? [phone] ? ? If you have any questions prior to your surgery date call [phone]: Open Monday-Friday 8am-4pm ? ? ? Remember: ? Do not eat or drink after midnight the night before your surgery ? ?  ? Take these medicines the morning of surgery with A SIP OF WATER:  ?hydroxychloroquine (PLAQUENIL)  ? ?IF NEEDED ?Carboxymethylcellul-Glycerin (LUBRICATING EYE DROPS OP) ?cetirizine (ZYRTEC ALLERGY) ?GLUCAGON ?traMADol Veatrice Bourbon)  ? ?As of today, STOP taking any Aspirin (unless otherwise instructed by your surgeon) Aleve, Naproxen, Ibuprofen, Motrin, Advil, Goody's, BC's, all herbal medications, fish oil, and all vitamins. ? ?WHAT DO I DO ABOUT MY DIABETES MEDICATION? ? ? ?Do not take oral diabetes medicines (pills) the morning of surgery. ? ?THE MORNING OF SURGERY, do not take metFORMIN (GLUCOPHAGE)  ? ?The day of surgery, do not take other diabetes injectables, including Byetta (exenatide), Bydureon (exenatide ER), Victoza (liraglutide), or Trulicity (dulaglutide). ? ?If your CBG is greater than 220 mg/dL, you may take ? of your sliding scale (correction) dose of insulin. ? ? ?HOW TO MANAGE YOUR DIABETES ?BEFORE AND AFTER SURGERY ? ?Why is it important to control my blood sugar before and after surgery? ?Improving blood sugar levels before and after surgery helps healing and can limit problems. ?A way of improving blood sugar control is eating a healthy diet by: ? Eating less sugar and carbohydrates ? Increasing activity/exercise ? Talking with your doctor about reaching your blood sugar goals ?High blood sugars (greater than 180 mg/dL) can raise your risk of infections and slow your recovery, so you will need to focus on controlling your diabetes during the weeks before surgery. ?Make  sure that the doctor who takes care of your diabetes knows about your planned surgery including the date and location. ? ?How do I manage my blood sugar before surgery? ?Check your blood sugar at least 4 times a day, starting 2 days before surgery, to make sure that the level is not too high or low. ? ?Check your blood sugar the morning of your surgery when you wake up and every 2 hours until you get to the Short Stay unit. ? ?If your blood sugar is less than 70 mg/dL, you will need to treat for low blood sugar: ?Do not take insulin. ?Treat a low blood sugar (less than 70 mg/dL) with ? cup of clear juice (cranberry or apple), 4 glucose tablets, OR glucose gel. ?Recheck blood sugar in 15 minutes after treatment (to make sure it is greater than 70 mg/dL). If your blood sugar is not greater than 70 mg/dL on recheck, call [phone] for further instructions. ?Report your blood sugar to the short stay nurse when you get to Short Stay. ? ?If you are admitted to the hospital after surgery: ?Your blood sugar will be checked by the staff and you will probably be given insulin after surgery (instead of oral diabetes medicines) to make sure you have good blood sugar levels. ?The goal for blood sugar control after surgery is 80-180 mg/dL. ? ?         ?Do not wear jewelry or makeup ?Do not wear lotions, powders, perfumes/colognes, or deodorant. ?Do not shave 48 hours prior to surgery. ?Do not bring valuables to the hospital. ?Do not wear nail polish, gel  polish, artificial nails, or any other type of covering on natural nails (fingers and toes) ?If you have artificial nails or gel coating that need to be removed by a nail salon, please have this removed prior to surgery. Artificial nails or gel coating may interfere with anesthesia's ability to adequately monitor your vital signs. ? ?[facility] is not responsible for any belongings or valuables. .  ? ?Do NOT Smoke (Tobacco/Vaping)  24 hours prior to your procedure ? ?If you  use a CPAP at night, you may bring your mask for your overnight stay. ?  ?Contacts, glasses, hearing aids, dentures or partials may not be worn into surgery, please bring cases for these belongings ?  ?For patients admitted to the hospital, discharge time will be determined by your treatment team. ?  ?Patients discharged the day of surgery will not be allowed to drive home, and someone needs to stay with them for 24 hours. ? ? ?SURGICAL WAITING ROOM VISITATION ?Patients having surgery or a procedure in a hospital may have two support people. ?Children under the age of 14 must have an adult with them who is not the patient. ?They may stay in the waiting area during the procedure and may switch out with other visitors. If the patient needs to stay at the hospital during part of their recovery, the visitor guidelines for inpatient rooms apply. ? ?Please refer to the [facility] website for the visitor guidelines for Inpatients (after your surgery is over and you are in a regular room).  ? ? ? ? ? ?Special instructions:   ? ?Oral Hygiene is also important to reduce your risk of infection.  Remember - BRUSH YOUR TEETH THE MORNING OF SURGERY WITH YOUR REGULAR TOOTHPASTE ? ? ?[facility]- Preparing For Surgery ? ?Before surgery, you can play an important role. Because skin is not sterile, your skin needs to be as free of germs as possible. You can reduce the number of germs on your skin by washing with CHG (chlorahexidine gluconate) Soap before surgery.  CHG is an antiseptic cleaner which kills germs and bonds with the skin to continue killing germs even after washing.   ? ? ?Please do not use if you have an allergy to CHG or antibacterial soaps. If your skin becomes reddened/irritated stop using the CHG.  ?Do not shave (including legs and underarms) for at least 48 hours prior to first CHG shower. It is OK to shave your face. ? ?Please follow these instructions carefully. ?  ? ? Shower the NIGHT BEFORE SURGERY and the  MORNING OF SURGERY with CHG Soap.  ? If you chose to wash your hair, wash your hair first as usual with your normal shampoo. After you shampoo, rinse your hair and body thoroughly to remove the shampoo.  Then [employer] and genitals (private parts) with your normal soap and rinse thoroughly to remove soap. ? ?After that Use CHG Soap as you would any other liquid soap. You can apply CHG directly to the skin and wash gently with a scrungie or a clean washcloth.  ? ?Apply the CHG Soap to your body ONLY FROM THE NECK DOWN.  Do not use on open wounds or open sores. Avoid contact with your eyes, ears, mouth and genitals (private parts). Wash Face and genitals (private parts)  with your normal soap.  ? ?Wash thoroughly, paying special attention to the area where your surgery will be performed. ? ?Thoroughly rinse your body with warm water from the neck down. ? ?  DO NOT shower/wash with your normal soap after using and rinsing off the CHG Soap. ? ?Pat yourself dry with a CLEAN TOWEL. ? ?Wear CLEAN PAJAMAS to bed the night before surgery ? ?Place CLEAN SHEETS on your bed the night before your surgery ? ?DO NOT SLEEP WITH PETS. ? ? ?Day of Surgery: ?Take a shower with CHG soap. ?Wear Clean/Comfortable clothing the morning of surgery ?Do not apply any deodorants/lotions.   ?Remember to brush your teeth WITH YOUR REGULAR TOOTHPASTE. ? ? ? ?If you received a COVID test during your pre-op visit, it is requested that you wear a mask when out in public, stay away from anyone that may not be feeling well, and notify your surgeon if you develop symptoms. If you have been in contact with anyone that has tested positive in the last 10 days, please notify your surgeon. ? ?  ?Please read over the following fact sheets that you were given.  ? ?

## 2021-07-27 ENCOUNTER — Encounter (HOSPITAL_COMMUNITY)
Admission: RE | Admit: 2021-07-27 | Discharge: 2021-07-27 | Disposition: A | Discharge status: Home / Self Care | Payer: Medicare Other | Source: Ambulatory Visit | Attending: Neurosurgery | Admitting: Neurosurgery

## 2021-07-27 ENCOUNTER — Other Ambulatory Visit: Payer: Self-pay

## 2021-07-27 ENCOUNTER — Encounter (HOSPITAL_COMMUNITY): Payer: Self-pay

## 2021-07-27 VITALS — BP 137/65 | HR 65 | Temp 98.5°F | Resp 17 | Ht 61.0 in | Wt 139.0 lb

## 2021-07-27 DIAGNOSIS — E119 Type 2 diabetes mellitus without complications: Secondary | ICD-10-CM | POA: Diagnosis not present

## 2021-07-27 DIAGNOSIS — Z01812 Encounter for preprocedural laboratory examination: Secondary | ICD-10-CM | POA: Diagnosis present

## 2021-07-27 DIAGNOSIS — Z01818 Encounter for other preprocedural examination: Secondary | ICD-10-CM

## 2021-07-27 LAB — BASIC METABOLIC PANEL
Anion gap: 12 (ref 5–15)
BUN: 6 mg/dL — ABNORMAL LOW (ref 8–23)
CO2: 24 mmol/L (ref 22–32)
Calcium: 9.3 mg/dL (ref 8.9–10.3)
Chloride: 101 mmol/L (ref 98–111)
Creatinine, Ser: 0.44 mg/dL (ref 0.44–1.00)
GFR, Estimated: >60 mL/min (ref >60)
Glucose, Bld: 96 mg/dL (ref 70–99)
Potassium: 3.9 mmol/L (ref 3.5–5.1)
Sodium: 137 mmol/L (ref 135–145)

## 2021-07-27 LAB — TYPE AND SCREEN
ABO/RH(D): B POS
Antibody Screen: NEGATIVE

## 2021-07-27 LAB — HEMOGLOBIN A1C
Hgb A1c MFr Bld: 6.4 % — ABNORMAL HIGH (ref 4.8–5.6)
Mean Plasma Glucose: 136.98 mg/dL

## 2021-07-27 LAB — SURGICAL PCR SCREEN
MRSA, PCR: NEGATIVE
Staphylococcus aureus: NEGATIVE

## 2021-07-27 LAB — GLUCOSE, CAPILLARY: Glucose-Capillary: 96 mg/dL (ref 70–99)

## 2021-07-27 NOTE — Progress Notes (Signed)
PCP - Baxter Hire ?Cardiologist - denies ? ?PPM/ICD - denies ?Device Orders -  ?Rep Notified -  ? ?Chest x-ray - na ?EKG - 720/22 ?Stress Test - none ?ECHO - none ?Cardiac Cath - none ? ?Sleep Study - denies ?CPAP -  ? ?Fasting Blood Sugar - 90-100 ?Checks Blood Sugar every morning ? ?Blood Thinner Instructions:na ?Aspirin Instructions:na ? ?ERAS Protcol -no ?PRE-SURGERY Ensure or G2-  ? ?COVID TEST- no ? ? ?Anesthesia review: no ? ?Patient denies shortness of breath, fever, cough and chest pain at PAT appointment ? ? ?All instructions explained to the patient, with a verbal understanding of the material. Patient agrees to go over the instructions while at home for a better understanding. Patient also instructed to wear a mask when out in public prior to surgery. The opportunity to ask questions was provided. ?  ?

## 2021-07-30 NOTE — Anesthesia Preprocedure Evaluation (Addendum)
Anesthesia Evaluation  ?Patient identified by MRN, date of birth, ID band ?Patient awake ? ? ? ?Reviewed: ?Allergy & Precautions, NPO status , Patient's Chart, lab work & pertinent test results ? ?History of Anesthesia Complications ?(+) PONV and history of anesthetic complications ? ?Airway ?Mallampati: III ? ?TM Distance: >3 FB ?Neck ROM: Limited ? ?Mouth opening: Limited Mouth Opening ? Dental ? ?(+) Partial Upper, Dental Advisory Given ?  ?Pulmonary ?neg pulmonary ROS,  ?  ?Pulmonary exam normal ? ? ? ? ? ? ? Cardiovascular ?hypertension, Normal cardiovascular exam ? ? ?  ?Neuro/Psych ?Wearing soft C-collar ?negative psych ROS  ? GI/Hepatic ?Neg liver ROS, GERD  Poorly Controlled,  ?Endo/Other  ?diabetes, Type 2, Oral Hypoglycemic Agents ? Renal/GU ?negative Renal ROS  ?negative genitourinary ?  ?Musculoskeletal ? ?(+) Arthritis , Rheumatoid disorders,   ? Abdominal ?  ?Peds ? Hematology ?negative hematology ROS ?(+)   ?Anesthesia Other Findings ?Day of surgery medications reviewed with patient. ? Reproductive/Obstetrics ?negative OB ROS ? ?  ? ? ? ? ? ? ? ? ? ? ? ? ? ?  ?  ? ? ? ? ? ? ?Anesthesia Physical ?Anesthesia Plan ? ?ASA: 2 ? ?Anesthesia Plan: General  ? ?Post-op Pain Management: Tylenol PO (pre-op)*  ? ?Induction: Intravenous ? ?PONV Risk Score and Plan: 4 or greater and Treatment may vary due to age or medical condition, Dexamethasone, Ondansetron and Propofol infusion ? ?Airway Management Planned: Oral ETT and Video Laryngoscope Planned ? ?Additional Equipment: None ? ?Intra-op Plan:  ? ?Post-operative Plan: Extubation in OR ? ?Informed Consent: I have reviewed the patients History and Physical, chart, labs and discussed the procedure including the risks, benefits and alternatives for the proposed anesthesia with the patient or authorized representative who has indicated his/her understanding and acceptance.  ? ? ? ?Dental advisory given ? ?Plan Discussed with:  CRNA ? ?Anesthesia Plan Comments:   ? ? ? ? ? ?Anesthesia Quick Evaluation ? ?

## 2021-07-31 ENCOUNTER — Other Ambulatory Visit: Payer: Self-pay

## 2021-07-31 ENCOUNTER — Inpatient Hospital Stay (HOSPITAL_COMMUNITY)
Admission: RE | Admit: 2021-07-31 | Discharge: 2021-08-01 | DRG: 473 | Disposition: A | Discharge status: Home / Self Care | Payer: Medicare Other | Attending: Neurosurgery | Admitting: Neurosurgery

## 2021-07-31 ENCOUNTER — Inpatient Hospital Stay (HOSPITAL_COMMUNITY): Payer: Medicare Other | Admitting: Certified Registered"

## 2021-07-31 ENCOUNTER — Inpatient Hospital Stay (HOSPITAL_COMMUNITY): Payer: Medicare Other

## 2021-07-31 ENCOUNTER — Encounter (HOSPITAL_COMMUNITY): Payer: Self-pay | Admitting: Neurosurgery

## 2021-07-31 ENCOUNTER — Encounter (HOSPITAL_COMMUNITY)
Admission: RE | Disposition: A | Discharge status: Home / Self Care | Payer: Self-pay | Source: Home / Self Care | Attending: Neurosurgery

## 2021-07-31 DIAGNOSIS — I10 Essential (primary) hypertension: Secondary | ICD-10-CM | POA: Diagnosis present

## 2021-07-31 DIAGNOSIS — Z90710 Acquired absence of both cervix and uterus: Secondary | ICD-10-CM | POA: Diagnosis not present

## 2021-07-31 DIAGNOSIS — Z923 Personal history of irradiation: Secondary | ICD-10-CM

## 2021-07-31 DIAGNOSIS — E785 Hyperlipidemia, unspecified: Secondary | ICD-10-CM | POA: Diagnosis present

## 2021-07-31 DIAGNOSIS — Z7984 Long term (current) use of oral hypoglycemic drugs: Secondary | ICD-10-CM | POA: Diagnosis not present

## 2021-07-31 DIAGNOSIS — Z853 Personal history of malignant neoplasm of breast: Secondary | ICD-10-CM | POA: Diagnosis not present

## 2021-07-31 DIAGNOSIS — Z888 Allergy status to other drugs, medicaments and biological substances status: Secondary | ICD-10-CM

## 2021-07-31 DIAGNOSIS — K219 Gastro-esophageal reflux disease without esophagitis: Secondary | ICD-10-CM | POA: Diagnosis present

## 2021-07-31 DIAGNOSIS — M4802 Spinal stenosis, cervical region: Secondary | ICD-10-CM | POA: Diagnosis present

## 2021-07-31 DIAGNOSIS — Z981 Arthrodesis status: Secondary | ICD-10-CM | POA: Diagnosis not present

## 2021-07-31 DIAGNOSIS — M47812 Spondylosis without myelopathy or radiculopathy, cervical region: Secondary | ICD-10-CM | POA: Diagnosis present

## 2021-07-31 DIAGNOSIS — M532X2 Spinal instabilities, cervical region: Secondary | ICD-10-CM

## 2021-07-31 DIAGNOSIS — Z885 Allergy status to narcotic agent status: Secondary | ICD-10-CM | POA: Diagnosis not present

## 2021-07-31 DIAGNOSIS — Z01818 Encounter for other preprocedural examination: Secondary | ICD-10-CM

## 2021-07-31 DIAGNOSIS — M532X1 Spinal instabilities, occipito-atlanto-axial region: Principal | ICD-10-CM | POA: Diagnosis present

## 2021-07-31 DIAGNOSIS — E119 Type 2 diabetes mellitus without complications: Secondary | ICD-10-CM | POA: Diagnosis present

## 2021-07-31 DIAGNOSIS — M069 Rheumatoid arthritis, unspecified: Secondary | ICD-10-CM | POA: Diagnosis present

## 2021-07-31 HISTORY — PX: POSTERIOR CERVICAL FUSION/FORAMINOTOMY: SHX5038

## 2021-07-31 LAB — GLUCOSE, CAPILLARY
Glucose-Capillary: 111 mg/dL — ABNORMAL HIGH (ref 70–99)
Glucose-Capillary: 125 mg/dL — ABNORMAL HIGH (ref 70–99)
Glucose-Capillary: 190 mg/dL — ABNORMAL HIGH (ref 70–99)
Glucose-Capillary: 203 mg/dL — ABNORMAL HIGH (ref 70–99)
Glucose-Capillary: 207 mg/dL — ABNORMAL HIGH (ref 70–99)

## 2021-07-31 LAB — ABO/RH: ABO/RH(D): B POS

## 2021-07-31 SURGERY — POSTERIOR CERVICAL FUSION/FORAMINOTOMY LEVEL 2
Anesthesia: General | Site: Spine Cervical

## 2021-07-31 MED ORDER — HYDROMORPHONE HCL 1 MG/ML IJ SOLN
1.0000 mg | INTRAMUSCULAR | Status: DC | PRN
  Administered 2021-07-31: 1 mg via INTRAVENOUS
  Filled 2021-07-31: qty 1

## 2021-07-31 MED ORDER — THROMBIN 5000 UNITS EX SOLR
CUTANEOUS | Status: AC
  Filled 2021-07-31: qty 5000

## 2021-07-31 MED ORDER — THROMBIN 5000 UNITS EX SOLR
CUTANEOUS | Status: DC | PRN
  Administered 2021-07-31: 5 mL

## 2021-07-31 MED ORDER — CEFAZOLIN SODIUM-DEXTROSE 2-4 GM/100ML-% IV SOLN
INTRAVENOUS | Status: AC
  Filled 2021-07-31: qty 100

## 2021-07-31 MED ORDER — VANCOMYCIN HCL 1000 MG IV SOLR
INTRAVENOUS | Status: DC | PRN
  Administered 2021-07-31: 1000 mg

## 2021-07-31 MED ORDER — ONDANSETRON HCL 4 MG/2ML IJ SOLN
INTRAMUSCULAR | Status: DC | PRN
  Administered 2021-07-31: 4 mg via INTRAVENOUS

## 2021-07-31 MED ORDER — METFORMIN HCL 500 MG PO TABS
500.0000 mg | ORAL_TABLET | Freq: Every day | ORAL | Status: DC
  Administered 2021-08-01: 500 mg via ORAL
  Filled 2021-07-31: qty 1

## 2021-07-31 MED ORDER — ONDANSETRON HCL 4 MG/2ML IJ SOLN
INTRAMUSCULAR | Status: AC
  Filled 2021-07-31: qty 2

## 2021-07-31 MED ORDER — SUGAMMADEX SODIUM 200 MG/2ML IV SOLN
INTRAVENOUS | Status: DC | PRN
  Administered 2021-07-31: 150 mg via INTRAVENOUS

## 2021-07-31 MED ORDER — SODIUM CHLORIDE 0.9% FLUSH: 3.0000 mL | INTRAVENOUS | Status: DC | PRN

## 2021-07-31 MED ORDER — CHLORHEXIDINE GLUCONATE CLOTH 2 % EX PADS: 6.0000 | MEDICATED_PAD | Freq: Once | CUTANEOUS | Status: DC

## 2021-07-31 MED ORDER — ONDANSETRON HCL 4 MG PO TABS: 4.0000 mg | ORAL_TABLET | Freq: Four times a day (QID) | ORAL | Status: DC | PRN

## 2021-07-31 MED ORDER — GLUCAGON EMERGENCY 1 MG IJ KIT: 1.0000 mg | PACK | Freq: Once | INTRAMUSCULAR | Status: DC | PRN

## 2021-07-31 MED ORDER — BACITRACIN ZINC 500 UNIT/GM EX OINT
TOPICAL_OINTMENT | CUTANEOUS | Status: DC | PRN
  Administered 2021-07-31: 1 via TOPICAL

## 2021-07-31 MED ORDER — BACITRACIN ZINC 500 UNIT/GM EX OINT
TOPICAL_OINTMENT | CUTANEOUS | Status: AC
  Filled 2021-07-31: qty 28.35

## 2021-07-31 MED ORDER — LACTATED RINGERS IV SOLN: INTRAVENOUS | Status: DC

## 2021-07-31 MED ORDER — DEXAMETHASONE SODIUM PHOSPHATE 10 MG/ML IJ SOLN
INTRAMUSCULAR | Status: AC
  Filled 2021-07-31: qty 1

## 2021-07-31 MED ORDER — ORAL CARE MOUTH RINSE: 15.0000 mL | Freq: Once | OROMUCOSAL | Status: AC

## 2021-07-31 MED ORDER — LIDOCAINE 2% (20 MG/ML) 5 ML SYRINGE
INTRAMUSCULAR | Status: DC | PRN
  Administered 2021-07-31: 60 mg via INTRAVENOUS

## 2021-07-31 MED ORDER — FENTANYL CITRATE (PF) 100 MCG/2ML IJ SOLN
25.0000 ug | INTRAMUSCULAR | Status: DC | PRN
  Administered 2021-07-31: 50 ug via INTRAVENOUS
  Administered 2021-07-31: 25 ug via INTRAVENOUS
  Administered 2021-07-31: 50 ug via INTRAVENOUS
  Administered 2021-07-31: 25 ug via INTRAVENOUS

## 2021-07-31 MED ORDER — ROCURONIUM BROMIDE 10 MG/ML (PF) SYRINGE
PREFILLED_SYRINGE | INTRAVENOUS | Status: DC | PRN
Start: 2021-07-31 — End: 2021-07-31
  Administered 2021-07-31: 50 mg via INTRAVENOUS
  Administered 2021-07-31: 10 mg via INTRAVENOUS

## 2021-07-31 MED ORDER — FENTANYL CITRATE (PF) 100 MCG/2ML IJ SOLN
INTRAMUSCULAR | Status: AC
  Filled 2021-07-31: qty 2

## 2021-07-31 MED ORDER — HYDROCODONE-ACETAMINOPHEN 5-325 MG PO TABS: 1.0000 | ORAL_TABLET | ORAL | Status: DC | PRN

## 2021-07-31 MED ORDER — CEFAZOLIN SODIUM-DEXTROSE 1-4 GM/50ML-% IV SOLN
1.0000 g | Freq: Three times a day (TID) | INTRAVENOUS | Status: AC
  Administered 2021-07-31 (×2): 1 g via INTRAVENOUS
  Filled 2021-07-31 (×2): qty 50

## 2021-07-31 MED ORDER — ACETAMINOPHEN 325 MG PO TABS: 650.0000 mg | ORAL_TABLET | ORAL | Status: DC | PRN

## 2021-07-31 MED ORDER — ACETAMINOPHEN 500 MG PO TABS: 1000.0000 mg | ORAL_TABLET | Freq: Once | ORAL | Status: AC

## 2021-07-31 MED ORDER — PHENOL 1.4 % MT LIQD: 1.0000 | OROMUCOSAL | Status: DC | PRN

## 2021-07-31 MED ORDER — HYDROXYZINE HCL 50 MG/ML IM SOLN
50.0000 mg | Freq: Four times a day (QID) | INTRAMUSCULAR | Status: DC | PRN
Start: 2021-07-31 — End: 2021-08-01

## 2021-07-31 MED ORDER — CARBOXYMETHYLCELLUL-GLYCERIN 0.5-0.9 % OP SOLN: 1.0000 [drp] | Freq: Every day | OPHTHALMIC | Status: DC | PRN

## 2021-07-31 MED ORDER — 0.9 % SODIUM CHLORIDE (POUR BTL) OPTIME
TOPICAL | Status: DC | PRN
Start: 2021-07-31 — End: 2021-07-31
  Administered 2021-07-31: 1000 mL

## 2021-07-31 MED ORDER — SODIUM CHLORIDE 0.9 % IV SOLN
250.0000 mL | INTRAVENOUS | Status: DC
  Administered 2021-07-31: 250 mL via INTRAVENOUS

## 2021-07-31 MED ORDER — OXYCODONE HCL 5 MG PO TABS: 5.0000 mg | ORAL_TABLET | Freq: Once | ORAL | Status: DC | PRN

## 2021-07-31 MED ORDER — DEXTROSE 50 % IV SOLN: 1.0000 | Freq: Once | INTRAVENOUS | Status: DC | PRN

## 2021-07-31 MED ORDER — PRAVASTATIN SODIUM 40 MG PO TABS
40.0000 mg | ORAL_TABLET | Freq: Every day | ORAL | Status: DC
  Administered 2021-07-31: 40 mg via ORAL
  Filled 2021-07-31: qty 1

## 2021-07-31 MED ORDER — POLYVINYL ALCOHOL 1.4 % OP SOLN: 1.0000 [drp] | OPHTHALMIC | Status: DC | PRN

## 2021-07-31 MED ORDER — LORATADINE 10 MG PO TABS: 10.0000 mg | ORAL_TABLET | Freq: Every day | ORAL | Status: DC | PRN

## 2021-07-31 MED ORDER — DEXAMETHASONE SODIUM PHOSPHATE 10 MG/ML IJ SOLN
INTRAMUSCULAR | Status: DC | PRN
  Administered 2021-07-31: 10 mg via INTRAVENOUS

## 2021-07-31 MED ORDER — CHLORHEXIDINE GLUCONATE 0.12 % MT SOLN
15.0000 mL | Freq: Once | OROMUCOSAL | Status: AC
  Administered 2021-07-31: 15 mL via OROMUCOSAL

## 2021-07-31 MED ORDER — GABAPENTIN 300 MG PO CAPS
300.0000 mg | ORAL_CAPSULE | Freq: Every day | ORAL | Status: DC
  Administered 2021-07-31: 300 mg via ORAL
  Filled 2021-07-31: qty 1

## 2021-07-31 MED ORDER — BUPIVACAINE HCL (PF) 0.25 % IJ SOLN: INTRAMUSCULAR | Status: DC | PRN

## 2021-07-31 MED ORDER — ACETAMINOPHEN 650 MG RE SUPP: 650.0000 mg | RECTAL | Status: DC | PRN

## 2021-07-31 MED ORDER — THROMBIN 5000 UNITS EX SOLR
CUTANEOUS | Status: DC | PRN
Start: 2021-07-31 — End: 2021-07-31
  Administered 2021-07-31: 5000 [IU] via TOPICAL

## 2021-07-31 MED ORDER — PROPOFOL 10 MG/ML IV BOLUS
INTRAVENOUS | Status: AC
  Filled 2021-07-31: qty 20

## 2021-07-31 MED ORDER — INSULIN ASPART 100 UNIT/ML IJ SOLN
0.0000 [IU] | Freq: Every day | INTRAMUSCULAR | Status: DC
  Administered 2021-07-31: 2 [IU] via SUBCUTANEOUS

## 2021-07-31 MED ORDER — PHENYLEPHRINE 80 MCG/ML (10ML) SYRINGE FOR IV PUSH (FOR BLOOD PRESSURE SUPPORT)
PREFILLED_SYRINGE | INTRAVENOUS | Status: AC
  Filled 2021-07-31: qty 10

## 2021-07-31 MED ORDER — MENTHOL 3 MG MT LOZG: 1.0000 | LOZENGE | OROMUCOSAL | Status: DC | PRN

## 2021-07-31 MED ORDER — HEMOSTATIC AGENTS (NO CHARGE) OPTIME
TOPICAL | Status: DC | PRN
  Administered 2021-07-31: 1

## 2021-07-31 MED ORDER — VANCOMYCIN HCL 1000 MG IV SOLR
INTRAVENOUS | Status: AC
  Filled 2021-07-31: qty 20

## 2021-07-31 MED ORDER — INSULIN ASPART 100 UNIT/ML IJ SOLN: 0.0000 [IU] | INTRAMUSCULAR | Status: DC | PRN

## 2021-07-31 MED ORDER — SODIUM CHLORIDE 0.9% FLUSH
3.0000 mL | Freq: Two times a day (BID) | INTRAVENOUS | Status: DC
  Administered 2021-07-31 (×2): 3 mL via INTRAVENOUS

## 2021-07-31 MED ORDER — FENTANYL CITRATE (PF) 250 MCG/5ML IJ SOLN
INTRAMUSCULAR | Status: AC
  Filled 2021-07-31: qty 5

## 2021-07-31 MED ORDER — TRAMADOL HCL 50 MG PO TABS
50.0000 mg | ORAL_TABLET | Freq: Four times a day (QID) | ORAL | Status: DC | PRN
  Administered 2021-07-31: 50 mg via ORAL
  Filled 2021-07-31: qty 1

## 2021-07-31 MED ORDER — ROCURONIUM BROMIDE 10 MG/ML (PF) SYRINGE
PREFILLED_SYRINGE | INTRAVENOUS | Status: AC
  Filled 2021-07-31: qty 10

## 2021-07-31 MED ORDER — FENTANYL CITRATE (PF) 250 MCG/5ML IJ SOLN
INTRAMUSCULAR | Status: DC | PRN
  Administered 2021-07-31 (×5): 50 ug via INTRAVENOUS

## 2021-07-31 MED ORDER — HYDROCODONE-ACETAMINOPHEN 10-325 MG PO TABS
2.0000 | ORAL_TABLET | ORAL | Status: DC | PRN
  Administered 2021-07-31 – 2021-08-01 (×3): 2 via ORAL
  Filled 2021-07-31 (×3): qty 2

## 2021-07-31 MED ORDER — LIDOCAINE 2% (20 MG/ML) 5 ML SYRINGE
INTRAMUSCULAR | Status: AC
  Filled 2021-07-31: qty 5

## 2021-07-31 MED ORDER — CEFAZOLIN SODIUM-DEXTROSE 2-4 GM/100ML-% IV SOLN
2.0000 g | INTRAVENOUS | Status: AC
  Administered 2021-07-31: 2 g via INTRAVENOUS

## 2021-07-31 MED ORDER — ONDANSETRON HCL 4 MG/2ML IJ SOLN: 4.0000 mg | Freq: Four times a day (QID) | INTRAMUSCULAR | Status: DC | PRN

## 2021-07-31 MED ORDER — VITAMIN B-12 1000 MCG PO TABS
1000.0000 ug | ORAL_TABLET | Freq: Every day | ORAL | Status: DC
  Administered 2021-07-31 – 2021-08-01 (×2): 1000 ug via ORAL
  Filled 2021-07-31 (×2): qty 1

## 2021-07-31 MED ORDER — BUPIVACAINE HCL (PF) 0.25 % IJ SOLN
INTRAMUSCULAR | Status: AC
  Filled 2021-07-31: qty 30

## 2021-07-31 MED ORDER — FOLIC ACID 1 MG PO TABS
1.0000 mg | ORAL_TABLET | Freq: Every day | ORAL | Status: DC
  Administered 2021-07-31 – 2021-08-01 (×2): 1 mg via ORAL
  Filled 2021-07-31 (×2): qty 1

## 2021-07-31 MED ORDER — OXYCODONE HCL 5 MG/5ML PO SOLN: 5.0000 mg | Freq: Once | ORAL | Status: DC | PRN

## 2021-07-31 MED ORDER — ACETAMINOPHEN 500 MG PO TABS
ORAL_TABLET | ORAL | Status: AC
  Administered 2021-07-31: 1000 mg via ORAL
  Filled 2021-07-31: qty 2

## 2021-07-31 MED ORDER — HYDROXYCHLOROQUINE SULFATE 200 MG PO TABS
200.0000 mg | ORAL_TABLET | Freq: Every day | ORAL | Status: DC
Start: 2021-08-01 — End: 2021-08-01
  Administered 2021-08-01: 200 mg via ORAL
  Filled 2021-07-31: qty 1

## 2021-07-31 MED ORDER — INSULIN ASPART 100 UNIT/ML IJ SOLN
0.0000 [IU] | Freq: Three times a day (TID) | INTRAMUSCULAR | Status: DC
  Administered 2021-08-01: 2 [IU] via SUBCUTANEOUS

## 2021-07-31 MED ORDER — CYCLOBENZAPRINE HCL 10 MG PO TABS
10.0000 mg | ORAL_TABLET | Freq: Three times a day (TID) | ORAL | Status: DC | PRN
  Administered 2021-07-31: 10 mg via ORAL
  Filled 2021-07-31: qty 1

## 2021-07-31 MED ORDER — PROPOFOL 10 MG/ML IV BOLUS
INTRAVENOUS | Status: DC | PRN
  Administered 2021-07-31 (×2): 20 mg via INTRAVENOUS
  Administered 2021-07-31: 30 mg via INTRAVENOUS
  Administered 2021-07-31: 100 mg via INTRAVENOUS

## 2021-07-31 SURGICAL SUPPLY — 69 items
ADH SKN CLS APL DERMABOND .7 (GAUZE/BANDAGES/DRESSINGS) ×1
APL SKNCLS STERI-STRIP NONHPOA (GAUZE/BANDAGES/DRESSINGS) ×1
BAG COUNTER SPONGE SURGICOUNT (BAG) ×2 IMPLANT
BAG DECANTER FOR FLEXI CONT (MISCELLANEOUS) ×2 IMPLANT
BAG SPNG CNTER NS LX DISP (BAG) ×1
BENZOIN TINCTURE PRP APPL 2/3 (GAUZE/BANDAGES/DRESSINGS) ×2 IMPLANT
BLADE CLIPPER SURG (BLADE) IMPLANT
BUR MATCHSTICK NEURO 3.0 LAGG (BURR) ×2 IMPLANT
CABLE SNG STERILE W/CRIMP (Cable) ×1 IMPLANT
CANISTER SUCT 3000ML PPV (MISCELLANEOUS) ×2 IMPLANT
CARTRIDGE OIL MAESTRO DRILL (MISCELLANEOUS) ×1 IMPLANT
CNTNR URN SCR LID CUP LEK RST (MISCELLANEOUS) IMPLANT
CONT SPEC 4OZ STRL OR WHT (MISCELLANEOUS) ×2
DERMABOND ADVANCED (GAUZE/BANDAGES/DRESSINGS) ×1
DERMABOND ADVANCED .7 DNX12 (GAUZE/BANDAGES/DRESSINGS) ×1 IMPLANT
DIFFUSER DRILL AIR PNEUMATIC (MISCELLANEOUS) ×2 IMPLANT
DRAPE C-ARM 42X72 X-RAY (DRAPES) ×4 IMPLANT
DRAPE LAPAROTOMY 100X72 PEDS (DRAPES) ×2 IMPLANT
DRSG OPSITE POSTOP 4X6 (GAUZE/BANDAGES/DRESSINGS) ×2 IMPLANT
DURAPREP 26ML APPLICATOR (WOUND CARE) ×2 IMPLANT
ELECT BLADE 4.0 EZ CLEAN MEGAD (MISCELLANEOUS) ×2
ELECT REM PT RETURN 9FT ADLT (ELECTROSURGICAL) ×2
ELECTRODE BLDE 4.0 EZ CLN MEGD (MISCELLANEOUS) IMPLANT
ELECTRODE REM PT RTRN 9FT ADLT (ELECTROSURGICAL) ×1 IMPLANT
EVACUATOR 1/8 PVC DRAIN (DRAIN) IMPLANT
GAUZE 4X4 16PLY ~~LOC~~+RFID DBL (SPONGE) IMPLANT
GAUZE SPONGE 4X4 12PLY STRL (GAUZE/BANDAGES/DRESSINGS) ×2 IMPLANT
GLOVE BIO SURGEON STRL SZ 6.5 (GLOVE) ×2 IMPLANT
GLOVE BIOGEL PI IND STRL 6.5 (GLOVE) ×1 IMPLANT
GLOVE BIOGEL PI INDICATOR 6.5 (GLOVE) ×1
GLOVE ECLIPSE 9.0 STRL (GLOVE) ×2 IMPLANT
GLOVE EXAM NITRILE XL STR (GLOVE) IMPLANT
GOWN STRL REUS W/ TWL LRG LVL3 (GOWN DISPOSABLE) IMPLANT
GOWN STRL REUS W/ TWL XL LVL3 (GOWN DISPOSABLE) ×1 IMPLANT
GOWN STRL REUS W/TWL 2XL LVL3 (GOWN DISPOSABLE) IMPLANT
GOWN STRL REUS W/TWL LRG LVL3 (GOWN DISPOSABLE)
GOWN STRL REUS W/TWL XL LVL3 (GOWN DISPOSABLE) ×2
HEMOSTAT POWDER SURGIFOAM 1G (HEMOSTASIS) ×1 IMPLANT
KIT BASIN OR (CUSTOM PROCEDURE TRAY) ×2 IMPLANT
KIT TURNOVER KIT B (KITS) ×2 IMPLANT
NDL SPNL 22GX3.5 QUINCKE BK (NEEDLE) ×1 IMPLANT
NEEDLE HYPO 22GX1.5 SAFETY (NEEDLE) ×2 IMPLANT
NEEDLE SPNL 22GX3.5 QUINCKE BK (NEEDLE) ×2 IMPLANT
NS IRRIG 1000ML POUR BTL (IV SOLUTION) ×2 IMPLANT
OIL CARTRIDGE MAESTRO DRILL (MISCELLANEOUS) ×2
PACK LAMINECTOMY NEURO (CUSTOM PROCEDURE TRAY) ×2 IMPLANT
PAD ARMBOARD 7.5X6 YLW CONV (MISCELLANEOUS) ×6 IMPLANT
PENCIL BUTTON HOLSTER BLD 10FT (ELECTRODE) ×1 IMPLANT
PIN MAYFIELD SKULL DISP (PIN) ×2 IMPLANT
ROD PRE CUT 3.5X40MM SPINAL (Rod) ×1 IMPLANT
ROD PRECUT 3.5X50 (Rod) ×1 IMPLANT
SCREW MULTI AXIAL 3.5X14MM (Screw) ×3 IMPLANT
SCREW MULTI AXIAL 3.5X16MM (Screw) ×1 IMPLANT
SCREW MULTI AXIAL PT 3.5X30 (Screw) ×1 IMPLANT
SCREW MULTI AXIAL PT 3.5X34 (Screw) ×2 IMPLANT
SET SCREW INFINITY IFIX THOR (Screw) ×6 IMPLANT
SPONGE SURGIFOAM ABS GEL SZ50 (HEMOSTASIS) ×2 IMPLANT
SPONGE T-LAP 4X18 ~~LOC~~+RFID (SPONGE) IMPLANT
STAPLER VISISTAT 35W (STAPLE) IMPLANT
STRIP CLOSURE SKIN 1/2X4 (GAUZE/BANDAGES/DRESSINGS) ×2 IMPLANT
SUT VIC AB 0 CT1 18XCR BRD8 (SUTURE) ×1 IMPLANT
SUT VIC AB 0 CT1 8-18 (SUTURE) ×2
SUT VIC AB 2-0 CT1 18 (SUTURE) ×2 IMPLANT
SUT VIC AB 3-0 SH 8-18 (SUTURE) ×2 IMPLANT
TAPE CLOTH 4X10 WHT NS (GAUZE/BANDAGES/DRESSINGS) ×2 IMPLANT
TISSUE ILIAC TRICORT 2.2X45 (Bone Implant) ×1 IMPLANT
TOWEL GREEN STERILE (TOWEL DISPOSABLE) ×2 IMPLANT
TOWEL GREEN STERILE FF (TOWEL DISPOSABLE) ×2 IMPLANT
WATER STERILE IRR 1000ML POUR (IV SOLUTION) ×2 IMPLANT

## 2021-07-31 NOTE — Brief Op Note (Signed)
07/31/2021 ? ?10:51 AM ? ?PATIENT:  Marjory Sneddon Eke  81 y.o. female ? ?PRE-OPERATIVE DIAGNOSIS:  Instability ? ?POST-OPERATIVE DIAGNOSIS:  * No post-op diagnosis entered * ? ?PROCEDURE:  Procedure(s): ?Posterior cervical fusion with lateral mass fixation - C1 - C2 - C3 (N/A) ? ?SURGEON:  Surgeon(s) and Role: ?   Earnie Larsson, MD - Primary ? ?PHYSICIAN ASSISTANT:  ? ?ASSISTANTS: Bergman,NP  ? ?ANESTHESIA:   general ? ?EBL:  250 mL  ? ?BLOOD ADMINISTERED:none ? ?DRAINS: none  ? ?LOCAL MEDICATIONS USED:  NONE ? ?SPECIMEN:  No Specimen ? ?DISPOSITION OF SPECIMEN:  N/A ? ?COUNTS:  YES ? ?TOURNIQUET:  * No tourniquets in log * ? ?DICTATION: .Dragon Dictation ? ?PLAN OF CARE: Admit to inpatient  ? ?PATIENT DISPOSITION:  PACU - hemodynamically stable. ?  ?Delay start of Pharmacological VTE agent (>24hrs) due to surgical blood loss or risk of bleeding: yes ? ?

## 2021-07-31 NOTE — Transfer of Care (Signed)
Immediate Anesthesia Transfer of Care Note ? ?Patient: Ashley Bray ? ?Procedure(s) Performed: Posterior cervical fusion with lateral mass fixation - C1 - C2 - C3 (Spine Cervical) ? ?Patient Location: PACU ? ?Anesthesia Type:General ? ?Level of Consciousness: awake, oriented and patient cooperative ? ?Airway & Oxygen Therapy: Patient Spontanous Breathing and Patient connected to nasal cannula oxygen ? ?Post-op Assessment: Report given to RN and Post -op Vital signs reviewed and stable ? ?Post vital signs: Reviewed ? ?Last Vitals:  ?Vitals Value Taken Time  ?BP 153/90 07/31/21 1102  ?Temp    ?Pulse 73 07/31/21 1103  ?Resp 25 07/31/21 1103  ?SpO2 100 % 07/31/21 1103  ?Vitals shown include unvalidated device data. ? ?Last Pain:  ?Vitals:  ? 07/31/21 0717  ?TempSrc:   ?PainSc: 8   ?   ? ?Patients Stated Pain Goal: 0 (07/31/21 0717) ? ?Complications: No notable events documented. ?

## 2021-07-31 NOTE — Progress Notes (Signed)
Orthopedic Tech Progress Note ?Patient Details:  ?[name] ?1940-04-14 ?[id number] ? ?RN stated patient has SOFT COLLAR  ? ?Patient ID: Ashley Bray, female   DOB: February 23, 1941, 81 y.o.   MRN: [id number] ? ?Ashley Bray ?07/31/2021, 2:11 PM ? ?

## 2021-07-31 NOTE — Anesthesia Postprocedure Evaluation (Signed)
Anesthesia Post Note ? ?Patient: Ashley Bray ? ?Procedure(s) Performed: Posterior cervical fusion with lateral mass fixation - C1 - C2 - C3 (Spine Cervical) ? ?  ? ?Patient location during evaluation: PACU ?Anesthesia Type: General ?Level of consciousness: awake and alert ?Pain management: pain level controlled ?Vital Signs Assessment: post-procedure vital signs reviewed and stable ?Respiratory status: spontaneous breathing, nonlabored ventilation and respiratory function stable ?Cardiovascular status: blood pressure returned to baseline ?Postop Assessment: no apparent nausea or vomiting ?Anesthetic complications: no ? ? ?No notable events documented. ? ?Last Vitals:  ?Vitals:  ? 07/31/21 1155 07/31/21 1210  ?BP: (!) 162/71 (!) 170/75  ?Pulse: 88 85  ?Resp: (!) 24 19  ?Temp:    ?SpO2: 91% 96%  ?  ?Last Pain:  ?Vitals:  ? 07/31/21 1130  ?TempSrc:   ?PainSc: 5   ? ? ?  ?  ?  ?  ?  ?  ? ?[name] ? ? ? ? ?

## 2021-07-31 NOTE — H&P (Signed)
?Ashley Bray is an 80 y.o. female.   ?Chief Complaint: Neck pain ?HPI: 80-year-old female with intractable neck pain with suboccipital radiation.  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  Patient is status post prior C2-C4 anterior cervical fusion without complication.  Patient has failed conservative management presents now for decompression and fusion at C1-2 in hopes of improving her symptoms. ? ?Past Medical History:  ?Diagnosis Date  ? Breast CA (HCC)   ? left  ? Breast cancer (HCC) 2015  ? 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 Cough   ? CHRONIC  ? Diabetes (HCC)   ? GERD (gastroesophageal reflux disease)   ? HLD (hyperlipidemia)   ? Hypertension   ? NO MEDS  ? Personal history of radiation therapy   ? Pneumonia   ? SMALL SPOT ON LUNG, HX RADIATION PNEUMONITIS   ? PONV (postoperative nausea and vomiting)   ? after hysterectomy  ? RA (rheumatoid arthritis) (HCC)   ? Radiation pneumonitis (HCC)   ? Wears dentures   ? partial upper  ? ? ?Past Surgical History:  ?Procedure Laterality Date  ? ABDOMINAL HYSTERECTOMY  1991  ? ANTERIOR CERVICAL DECOMP/DISCECTOMY FUSION N/A 03/16/2015  ? Procedure: ANTERIOR  CERVICAL DECOMPRESSION FUSION CERVICAL TWO-THREE,CERVICAL THREE-FOUR WITH REMOVAL OF HARDWARE AT CERVICAL FOUR-FIVE.;  Surgeon: Henry Pool, MD;  Location: [facility];  Service: Neurosurgery;  Laterality: N/A;  ? APPENDECTOMY    ? BREAST BIOPSY Left 08/31/2013  ? positive  ? BREAST LUMPECTOMY Left 10/06/2013 & 11/03/2013  ? CARPAL TUNNEL RELEASE Bilateral   ? CATARACT EXTRACTION W/PHACO Left 05/13/2018  ? Procedure: CATARACT EXTRACTION PHACO AND INTRAOCULAR LENS PLACEMENT (IOC) LEFT;  Surgeon: Porfilio, William, MD;  Location: [facility];  Service: Ophthalmology;  Laterality: Left;  US  00:32 ?CDE 3.76 ?Fluid pack lot # [id number]  ? CATARACT EXTRACTION W/PHACO Right 08/05/2018  ? Procedure: CATARACT EXTRACTION PHACO  AND INTRAOCULAR LENS PLACEMENT (IOC) RIGHT;  Surgeon: Porfilio, William, MD;  Location: [facility];  Service: Ophthalmology;  Laterality: Right;  PT WOULD LIKE TO BE FIRST CASE ?Diabetic - oal meds  ? COLONOSCOPY N/A 01/19/2020  ? Procedure: COLONOSCOPY;  Surgeon: Locklear, Cameron T, MD;  Location: [facility];  Service: Endoscopy;  Laterality: N/A;  ? JOINT REPLACEMENT Bilateral   ? knees  ? JOINT REPLACEMENT Right   ? shoulder  ? LUMBAR LAMINECTOMY/DECOMPRESSION MICRODISCECTOMY Right 10/07/2020  ? Procedure: Laminectomy and Foraminotomy - right - Lumbar Four-Lumbar Five - Lumbar Five-Sacral One;  Surgeon: Pool, Henry, MD;  Location: [facility];  Service: Neurosurgery;  Laterality: Right;  Laminectomy and Foraminotomy - right - Lumbar Four-Lumbar Five - Lumbar Five-Sacral One  ? neck fusion    ? NECK SURGERY  10/2016  ? REPLACEMENT TOTAL KNEE BILATERAL    ? x2  ? right shoulder replacement Right   ? ROTATOR CUFF REPAIR Right   ? ? ?Family History  ?Problem Relation Age of Onset  ? Cancer Mother   ?     kidney or bladder-unsure of which one  ? Cancer Brother 83  ?     lung  ? Cancer Brother 84  ?     lung  ? Breast cancer Neg Hx   ? ?Social History:  reports that she has never smoked. She has never used smokeless tobacco. She reports current alcohol use of about 2.0 standard drinks per week. She reports that she does not   use drugs. ? ?Allergies:  ?Allergies  ?Allergen Reactions  ? Codeine Nausea And Vomiting  ?  Tolerates tramadol   ? Gabapentin   ?  Couldn't think straight, anxiety, dizziness. Tolerates 300 mg at bedtime   ? ? ?Medications Prior to Admission  ?Medication Sig Dispense Refill  ? Carboxymethylcellul-Glycerin (LUBRICATING EYE DROPS OP) Place 1 drop into both eyes daily as needed (itchy eyes).    ? folic acid (FOLVITE) 1 MG tablet Take 1 mg by mouth daily.  11  ? gabapentin (NEURONTIN) 300 MG capsule Take 300 mg by mouth at bedtime.    ? GLUCAGON EMERGENCY 1 MG injection Inject 1 mg into the  muscle once as needed (low blood sugar).   0  ? hydroxychloroquine (PLAQUENIL) 200 MG tablet Take 200 mg by mouth daily.    ? metFORMIN (GLUCOPHAGE) 500 MG tablet Take 500 mg by mouth daily.     ? methotrexate (RHEUMATREX) 2.5 MG tablet Take 7.5 mg by mouth every Thursday. Caution:Chemotherapy. Protect from light.    ? pravastatin (PRAVACHOL) 40 MG tablet Take 40 mg by mouth at bedtime.     ? traMADol (ULTRAM) 50 MG tablet Take 50 mg by mouth every 6 (six) hours as needed for moderate pain.    ? vitamin B-12 (CYANOCOBALAMIN) 1000 MCG tablet Take 1,000 mcg by mouth daily.    ? Blood Glucose Monitoring Suppl (ONE TOUCH ULTRA 2) W/DEVICE KIT   11  ? cetirizine (ZYRTEC ALLERGY) 10 MG tablet Take 1 tablet (10 mg total) by mouth daily. (Patient taking differently: Take 10 mg by mouth daily as needed for allergies.) 30 tablet 12  ? ? ?Results for orders placed or performed during the hospital encounter of 07/31/21 (from the past 48 hour(s))  ?Glucose, capillary     Status: Abnormal  ? Collection Time: 07/31/21  6:35 AM  ?Result Value Ref Range  ? Glucose-Capillary 111 (H) 70 - 99 mg/dL  ?  Comment: Glucose reference range applies only to samples taken after fasting for at least 8 hours.  ? ?No results found. ? ?Pertinent items noted in HPI and remainder of comprehensive ROS otherwise negative. ? ?Blood pressure (!) 161/69, pulse 65, temperature 98.3 ?F (36.8 ?C), temperature source Oral, resp. rate 16, height 5' 1" (1.549 m), weight 62.6 kg, SpO2 99 %. ? ?Patient is awake and alert.  She is oriented and appropriate.  Speech is fluent.  Judgment insight are intact.  Cranial nerve function normal bilateral.  Motor examination reveals intact motor strength bilaterally sensory examination nonfocal.  Deep Temrex is normal active.  Noes long track signs.  Gait and posture are normal.  Examination head ears eyes nose and throat is unremarked.  Chest and abdomen are benign.  Extremities are free from injury or  deformity. 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  Plan C1-2 posterior cervical fusion with lateral mass instrumentation.  Risks and benefits been explained.  Patient wishes to proceed. ? ?Henry A Pool ?07/31/2021, 7:43 AM ? ? ? ?

## 2021-07-31 NOTE — Anesthesia Procedure Notes (Signed)
Procedure Name: Intubation ?Date/Time: 07/31/2021 8:23 AM ?Performed by: Jenne Campus, CRNA ?Pre-anesthesia Checklist: Patient identified, Emergency Drugs available, Suction available and Patient being monitored ?Patient Re-evaluated:Patient Re-evaluated prior to induction ?Oxygen Delivery Method: Circle System Utilized ?Preoxygenation: Pre-oxygenation with 100% oxygen ?Induction Type: IV induction ?Ventilation: Mask ventilation without difficulty ?Laryngoscope Size: Glidescope and 3 ?Grade View: Grade I ?Tube type: Oral ?Tube size: 7.0 mm ?Number of attempts: 1 ?Airway Equipment and Method: Stylet and Oral airway ?Placement Confirmation: ETT inserted through vocal cords under direct vision, positive ETCO2 and breath sounds checked- equal and bilateral ?Secured at: 20 cm ?Tube secured with: Tape ?Dental Injury: Teeth and Oropharynx as per pre-operative assessment  ?Difficulty Due To: Difficulty was anticipated and Difficult Airway- due to reduced neck mobility ? ? ? ? ?

## 2021-07-31 NOTE — Op Note (Signed)
Date of procedure: 07/31/2021 ? ?Date of dictation: Same ? ?Service: Neurosurgery ? ?Preoperative diagnosis: C1-C2 arthropathy with stenosis secondary to arthritic pannus ? ?Postoperative diagnosis: Same ? ?Procedure Name: C1, C2 partial decompressive laminectomies with C2 foraminotomies. ? ?C1-C2-C3 posterior fusion with lateral mass instrumentation, structural allograft and morselized autograft ? ?C1-C2 posterior interspinous wiring (Sontag method) ? ?Surgeon:Esdras Delair A.Eain Mullendore, M.D. ? ?Asst. Surgeon: Reinaldo Meeker, NP ? ?Anesthesia: General ? ?Indication: 81 year old female with intractable cervical pain with radiation into her occipital region.  Work-up demonstrates evidence of severe C1-C2 arthropathy with arthritic pannus and ventral cord compression as well as some posterior ligamentous buckling with dorsal cord compression.  Patient has failed conservative management.  She presents now for decompression and fusion surgery. ? ?Operative note: After induction of anesthesia, patient position prone onto bolsters with her head fixed in the [place] pin headrest.  Patient's posterior cervical region prepped and draped sterilely.  Incision made from her occiput down to her mid cervical spine.  Dissection performed bilaterally.  Retractors placed.  Fluoroscopy used.  Levels confirmed.  Interlaminar membrane at C1-2 was dissected free and resected.  The lamina of C1 and C2 were undercut to provide central decompression.  Decompression then proceeded laterally where C2 nerve root foraminotomies were performed.  At this point there was no evidence of any residual compression upon the thecal sac or nerve roots.  Entry sites into the lateral mass of C1 were determined bilaterally.  Pilot holes were drilled.  Pilot holes were confirmed to be in good position by lateral fluoroscopy.  32 mm Medtronic partially-threaded screws were then placed bilaterally at C1 with good purchase.  The lateral mass complex of C2 was extremely irregular  and osteoarthritic.  A lateral mass/pars interarticularis screw was placed on the left side with a 16 mm screw with good purchase.  The lateral mass of C3 was identified.  A pilot hole was drilled.  A 14 mm screw was placed in a cephalad to lateral trajectory with good purchase.  A pars interarticularis screw was placed on the right side at C2 and a lateral mass screw was placed in the right side at C3.  Images reveal good position of the hardware at the proper operative levels with normal alignment of the spine.  Piece of allograft tricortical ileum was then fashioned to fit between C1 and C2.  A single Songer cable was then passed underneath the lamina of C1 and then looped around the graft between C1 and C2.  This was then secured in place beneath the spinous process of C2 and the Sontag method.  The cable was tightened with good contact with the graft and the lamina and spinous process.  He was given a final cramping and then the excess wire was removed.  Short segment of titanium rods in place to the screw heads at C1-C2 and C3 bilaterally.  Locking caps placed over the screws and locking caps then engaged with a construct under mild compression.  Final images reveal good position of the construct with normal alignment of the spine and no evidence of complicating features.  Wound is then irrigated.  Vancomycin powder was placed in deep wound space.  Wound is then closed in layers with Vicryl sutures.  Steri-Strips and sterile dressing were applied.  No apparent complications.  Patient tolerated the procedure well and she returns to the recovery room postop. ? ?

## 2021-08-01 ENCOUNTER — Encounter (HOSPITAL_COMMUNITY): Payer: Self-pay | Admitting: Neurosurgery

## 2021-08-01 LAB — GLUCOSE, CAPILLARY: Glucose-Capillary: 126 mg/dL — ABNORMAL HIGH (ref 70–99)

## 2021-08-01 NOTE — Evaluation (Signed)
Occupational Therapy Evaluation and Discharge ?Patient Details ?Name: Ashley Bray ?MRN: [id number] ?DOB: [birth year] ?Today's Date: 08/01/2021 ? ? ?History of Present Illness THis 81 yo female s/p C1-C2-C3 posterior fusion. PHMx: RA, breast CA, DM,L4-S1 laminectomy, Bil knee replacements  ? ?Clinical Impression ?  ?This 81 yo female admitted and underwent above presents to acute OT with all education completed with pt and family as well as handout provided. No PT needs identified, we will sign off.  ?   ? ?Recommendations for follow up therapy are one component of a multi-disciplinary discharge planning process, led by the attending physician.  Recommendations may be updated based on patient status, additional functional criteria and insurance authorization.  ? ?Follow Up Recommendations ? No OT follow up  ?  ?Assistance Recommended at Discharge Frequent or constant Supervision/Assistance  ?Patient can return home with the following Help with stairs or ramp for entrance;A little help with bathing/dressing/bathroom ? ?  ?Functional Status Assessment ? Patient has had a recent decline in their functional status and demonstrates the ability to make significant improvements in function in a reasonable and predictable amount of time. (without further need for OT nor PT needs identified)  ?Equipment Recommendations ? None recommended by OT  ?  ?   ?Precautions / Restrictions Precautions ?Precautions: Cervical ?Precaution Booklet Issued: Yes (comment) ?Required Braces or Orthoses: Cervical Brace ?Cervical Brace: Soft collar ?Restrictions ?Weight Bearing Restrictions: No  ? ?  ? ?Mobility Bed Mobility ?Overal bed mobility: Modified Independent ?  ?  ?  ?  ?  ?  ?General bed mobility comments: out of hosipital bed with HOB up no rail ?  ? ?Transfers ?Overall transfer level: Modified independent ?Equipment used: Rolling walker (2 wheels) ?  ?  ?  ?  ?  ?  ?  ?General transfer comment: Pt went up and down a flight of steps Min  guard A holding onto one rail with a step-to pattern. Family will be there to do this with her. ?  ? ?  ?Balance Overall balance assessment: Needs assistance ?Sitting-balance support: No upper extremity supported, Feet supported ?Sitting balance-Leahy Scale: Good ?  ?  ?Standing balance support: Single extremity supported ?Standing balance-Leahy Scale: Fair ?Standing balance comment: reliant on RW intermittently ?  ?  ?  ?  ?  ?  ?  ?  ?  ?  ?  ?   ? ?ADL either performed or assessed with clinical judgement  ? ?ADL Overall ADL's : Needs assistance/impaired ?Eating/Feeding: Modified independent ?Eating/Feeding Details (indicate cue type and reason): due to cervical precautions. To use straws in all cups ?Grooming: Set up ?Grooming Details (indicate cue type and reason): Aware to use 2 cups for brushing teeth ?Upper Body Bathing: Set up;Sitting ?  ?Lower Body Bathing: Set up;Sit to/from stand ?  ?Upper Body Dressing : Minimal assistance;Sitting ?Upper Body Dressing Details (indicate cue type and reason): A for buttons, talked about all the options she can have for UBD ?Lower Body Dressing: Minimal assistance ?Lower Body Dressing Details (indicate cue type and reason): S sit<>stand, A for shoes only ?Toilet Transfer: Supervision/safety;Ambulation;Rolling walker (2 wheels) ?  ?Toileting- Clothing Manipulation and Hygiene: Supervision/safety;Sit to/from stand ?  ?  ?  ?  ?General ADL Comments: Issued pt a gait belt  ? ? ? ?Vision Patient Visual Report: No change from baseline ?   ?   ?   ?   ? ?Pertinent Vitals/Pain Pain Assessment ?Pain Assessment: 0-10 ?Pain Score: 2  ?Pain  Location: neck ?Pain Descriptors / Indicators: Aching, Sore ?Pain Intervention(s): Limited activity within patient's tolerance, Monitored during session, Ice applied  ? ? ? ?Hand Dominance Right ?  ?Extremity/Trunk Assessment Upper Extremity Assessment ?Upper Extremity Assessment: Overall WFL for tasks assessed ?  ?  ?  ?  ?  ?Communication  Communication ?Communication: No difficulties ?  ?Cognition Arousal/Alertness: Awake/alert ?Behavior During Therapy: [facility] for tasks assessed/performed ?Overall Cognitive Status: Within Functional Limits for tasks assessed ?  ?  ?  ?  ?  ?  ?  ?  ?  ?  ?  ?  ?  ?  ?  ?  ?  ?  ?  ?   ?   ?   ? ? ?Home Living Family/patient expects to be discharged to:: Private residence ?Living Arrangements: Spouse/significant other ?Available Help at Discharge: Family;Available 24 hours/day ?Type of Home: House ?Home Access: Stairs to enter ?Entrance Stairs-Number of Steps: 14 ?Entrance Stairs-Rails: Right ?Home Layout: One level ?  ?  ?Bathroom Shower/Tub: Walk-in shower ?  ?Bathroom Toilet: Standard ?  ?  ?Home Equipment: Shower seat;Grab bars - tub/shower;Hand held shower head ?  ?Additional Comments: adjustable bed, recliner (manual) ?  ? ?  ?Prior Functioning/Environment Prior Level of Function : Independent/Modified Independent ?  ?  ?  ?  ?  ?  ?Mobility Comments: used RW and SPC intermittently ?  ?  ? ?  ?  ?OT Problem List: Decreased range of motion;Impaired balance (sitting and/or standing);Pain ?  ?   ?   ?OT Goals(Current goals can be found in the care plan section) Acute Rehab OT Goals ?Patient Stated Goal: to go home today  ?   ? ?   ?AM-PAC OT "6 Clicks" Daily Activity     ?Outcome Measure Help from another person eating meals?: None ?Help from another person taking care of personal grooming?: None ?Help from another person toileting, which includes using toliet, bedpan, or urinal?: None ?Help from another person bathing (including washing, rinsing, drying)?: None ?Help from another person to put on and taking off regular upper body clothing?: A Little ?Help from another person to put on and taking off regular lower body clothing?: A Little ?6 Click Score: 22 ?  ?End of Session Equipment Utilized During Treatment: Gait belt;Rolling walker (2 wheels) ?Nurse Communication:  (no further OT needs, No PT needs  identified) ? ?Activity Tolerance: Patient tolerated treatment well ?Patient left: in bed;with call bell/phone within reach;with family/visitor present ? ?OT Visit Diagnosis: Unsteadiness on feet (R26.81);Muscle weakness (generalized) (M62.81);Pain ?Pain - part of body:  (incisional soreness)  ?              ?Time: 7616-0737 ?OT Time Calculation (min): 32 min ?Charges:  OT General Charges ?$OT Visit: 1 Visit ?OT Evaluation ?$OT Eval Moderate Complexity: 1 Mod ?OT Treatments ?$Self Care/Home Management : 8-22 mins ? ?Golden Circle, OTR/L ?Acute Rehab Services ?Pager [phone] ?Office [phone] ? ? ? ?Almon Register ?08/01/2021, 8:23 AM ?

## 2021-08-01 NOTE — Progress Notes (Signed)
Patient alert and oriented, mae's well, voiding adequate amount of urine, swallowing without difficulty, no c/o pain at time of discharge. Patient discharged home with family. Script and discharged instructions given to patient. Patient and family stated understanding of instructions given. Patient has an appointment with Dr. Pool  

## 2021-08-01 NOTE — Discharge Instructions (Addendum)
Wound Care ?Keep incision covered and dry for three days.  If you shower, cover incision with plastic wrap.  ?Do not put any creams, lotions, or ointments on incision. ?Leave steri-strips on back.  They will fall off by themselves. ?Activity ?Walk each and every day, increasing distance each day. ?No lifting greater than 5 lbs.  Avoid excessive neck motion. ?No driving for 2 weeks; may ride as a passenger locally. ?If provided with back brace, wear when out of bed.  It is not necessary to wear brace in bed. ?Diet ?Resume your normal diet.  ? ?Call Your Doctor If Any of These Occur ?Redness, drainage, or swelling at the wound.  ?Temperature greater than 101 degrees. ?Severe pain not relieved by pain medication. ?Incision starts to come apart. ?Follow Up Appt ?Call today for appointment in 1-2 weeks ([phone]) or for problems.  If you have any hardware placed in your spine, you will need an x-ray before your appointment. ? ?

## 2021-08-01 NOTE — Progress Notes (Signed)
PT Cancellation Note ? ?Patient Details ?Name: Ashley Bray ?MRN: [id number] ?DOB: [birth year] ? ? ?Cancelled Treatment:    Reason Eval/Treat Not Completed: PT screened, no needs identified, will sign off. Spoke with OT, pt functioning at necessary level, OT provided all education and mobility assessment. Pt with no skilled PT needs at this time. Acute PT SIGNING OFF. ? ?Kittie Plater, PT, DPT ?Acute Rehabilitation Services ?Secure chat preferred ?Office #: [phone] ? ? ? ?[name] ?08/01/2021, 8:06 AM ?

## 2021-08-01 NOTE — Discharge Summary (Signed)
Physician Discharge Summary  ?Patient ID: ?Ashley Bray ?MRN: [id number] ?DOB/AGE: 10/12/1940 81 y.o. ? ?Admit date: 07/31/2021 ?Discharge date: 08/01/2021 ? ?Admission Diagnoses: ? 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 ? ?Discharged Condition: good ? ?Hospital Course: Patient admitted to the hospital where she underwent uncomplicated posterior C1-3 decompression and fusion.  Postoperatively doing very well.  Preoperative severe neck pain and headaches resolved.  Neurologically intact.  Standing ambulating and voiding without difficulty.  Ready for discharge home. ? ?Consults:  ? ?Significant Diagnostic Studies:  ? ?Treatments:  ? ?Discharge Exam: ?Blood pressure (!) 148/64, pulse 70, temperature 98 ?F (36.7 ?C), temperature source Oral, resp. rate 16, height 5' 1" (1.549 m), weight 62.6 kg, SpO2 95 %. ?Awake and alert.  Oriented and appropriate.  Motor and sensory function intact.  Wound clean and dry.  Chest and abdomen benign. ? ?Disposition: Discharge disposition: 01-Home or Self Care ? ? ? ? ? ? ? ?Allergies as of 08/01/2021   ? ?   Reactions  ? Codeine Nausea And Vomiting  ? Tolerates tramadol   ? Gabapentin   ? Couldn't think straight, anxiety, dizziness. Tolerates 300 mg at bedtime   ? ?  ? ?  ?Medication List  ?  ? ?TAKE these medications   ? ?cetirizine 10 MG tablet ?Commonly known as: ZyrTEC Allergy ?Take 1 tablet (10 mg total) by mouth daily. ?What changed:  ?when to take this ?reasons to take this ?  ?folic acid 1 MG tablet ?Commonly known as: FOLVITE ?Take 1 mg by mouth daily. ?  ?gabapentin 300 MG capsule ?Commonly known as: NEURONTIN ?Take 300 mg by mouth at bedtime. ?  ?Glucagon Emergency 1 MG Kit ?Inject 1 mg into the muscle once as needed (low blood sugar). ?  ?hydroxychloroquine 200 MG tablet ?Commonly known as: PLAQUENIL ?Take 200 mg by mouth daily. ?  ?LUBRICATING EYE DROPS OP ?Place 1 drop into both eyes daily as needed (itchy eyes). ?  ?metFORMIN 500 MG  tablet ?Commonly known as: GLUCOPHAGE ?Take 500 mg by mouth daily. ?  ?methotrexate 2.5 MG tablet ?Commonly known as: RHEUMATREX ?Take 7.5 mg by mouth every Thursday. Caution:Chemotherapy. Protect from light. ?  ?ONE TOUCH ULTRA 2 w/Device Kit ?  ?pravastatin 40 MG tablet ?Commonly known as: PRAVACHOL ?Take 40 mg by mouth at bedtime. ?  ?traMADol 50 MG tablet ?Commonly known as: ULTRAM ?Take 50 mg by mouth every 6 (six) hours as needed for moderate pain. ?  ?vitamin B-12 1000 MCG tablet ?Commonly known as: CYANOCOBALAMIN ?Take 1,000 mcg by mouth daily. ?  ? ?  ? ? ? ?Signed: ?Henry A Pool ?08/01/2021, 9:48 AM ? ? ?

## 2021-10-24 ENCOUNTER — Other Ambulatory Visit: Payer: Self-pay | Admitting: Internal Medicine

## 2021-10-24 DIAGNOSIS — Z1231 Encounter for screening mammogram for malignant neoplasm of breast: Secondary | ICD-10-CM

## 2021-12-13 ENCOUNTER — Ambulatory Visit
Admission: RE | Admit: 2021-12-13 | Discharge: 2021-12-13 | Disposition: A | Discharge status: Home / Self Care | Payer: Medicare Other | Source: Ambulatory Visit | Attending: Internal Medicine | Admitting: Internal Medicine

## 2021-12-13 DIAGNOSIS — Z1231 Encounter for screening mammogram for malignant neoplasm of breast: Secondary | ICD-10-CM | POA: Diagnosis not present

## 2022-06-23 IMAGING — MG DIGITAL DIAGNOSTIC BILAT W/ TOMO W/ CAD
9 series · 9 of 25 positions shown · non-contrast
Comparison: Previous exams.

CLINICAL DATA: 78-year-old female with history of left breast
cancer post lumpectomy 2801.

EXAM:
DIGITAL DIAGNOSTIC BILATERAL MAMMOGRAM WITH TOMO AND CAD

[L MLO]
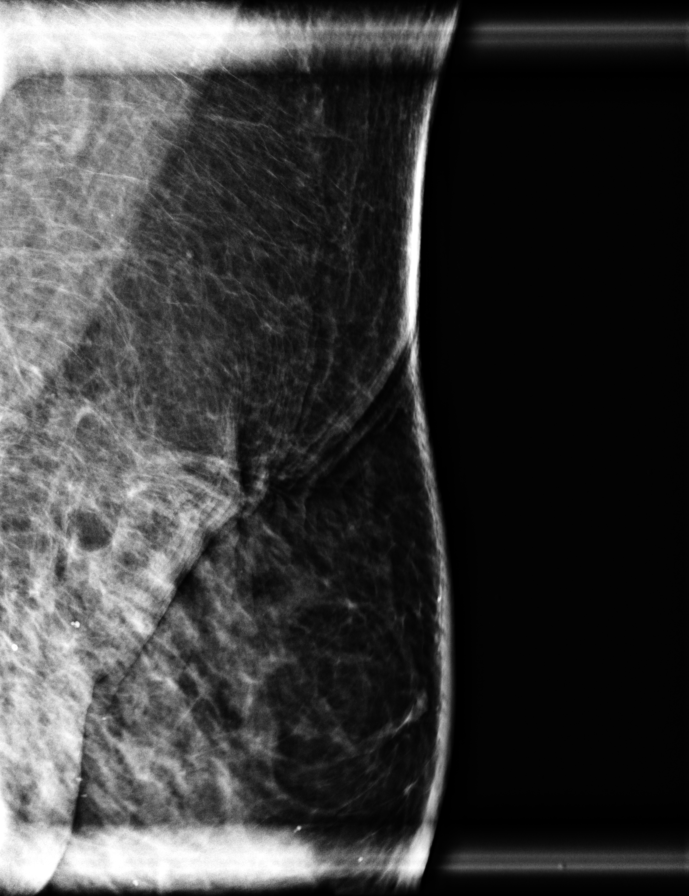

[L MLO synth-2D]
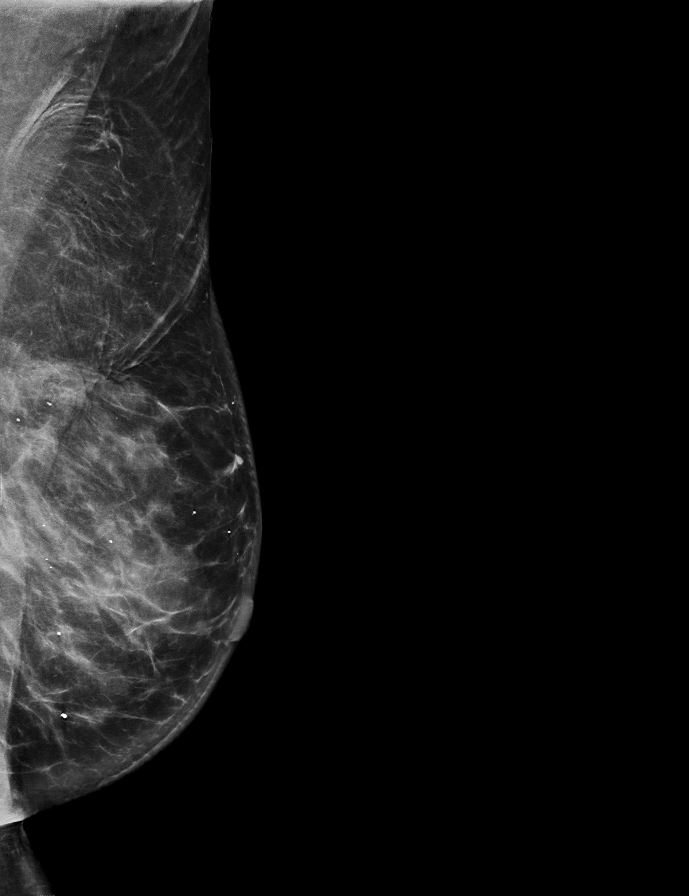

[L CC synth-2D]
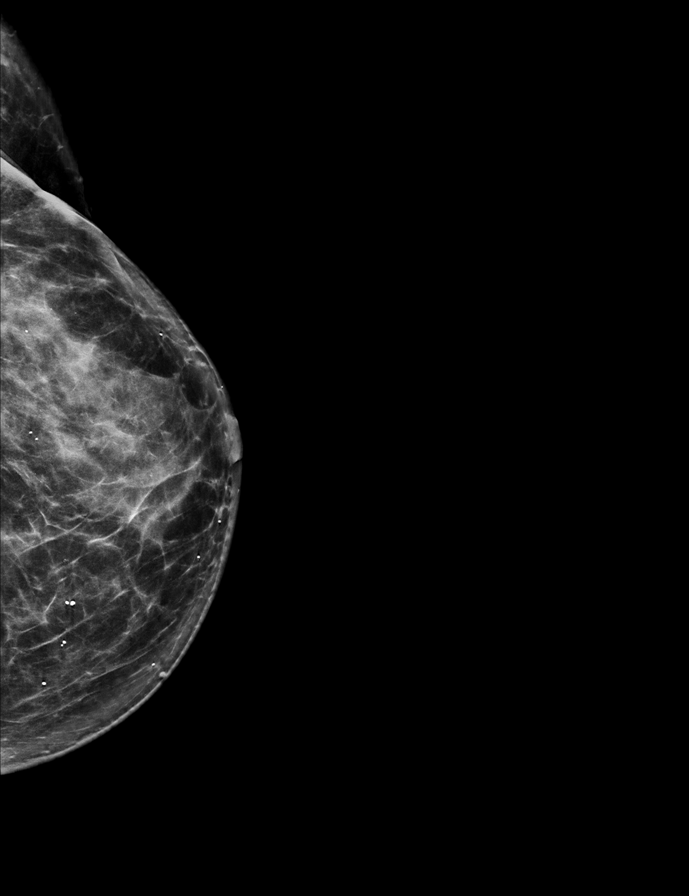

[R MLO synth-2D]
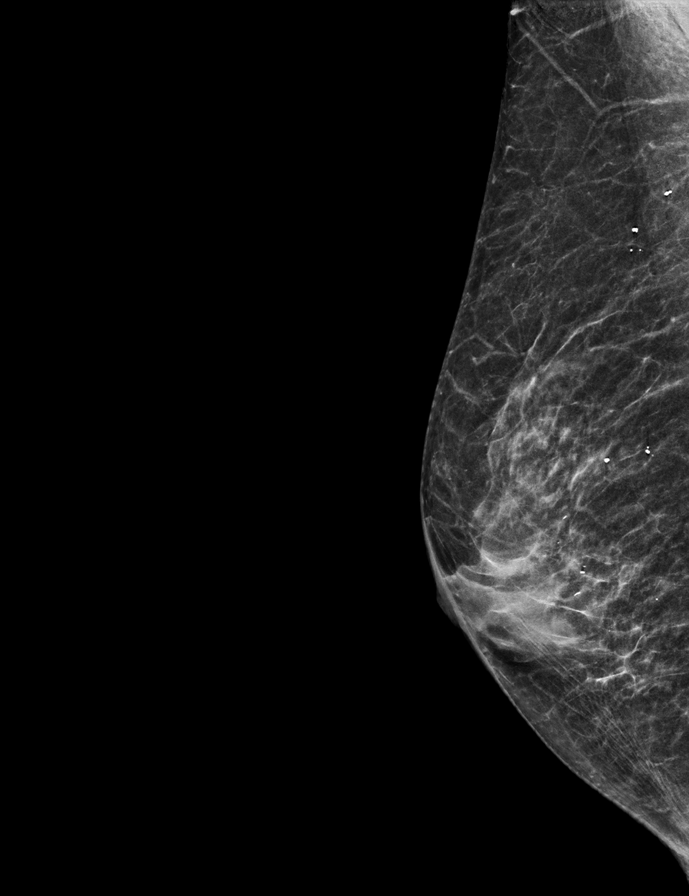

[R CC synth-2D]
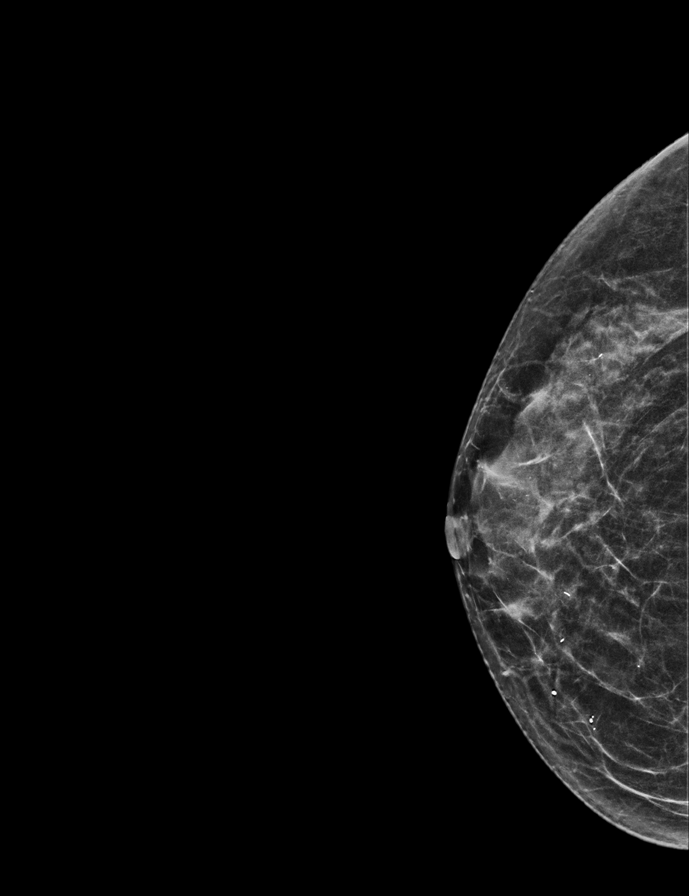

[R MLO tomo · tomo slice 26/51.0]
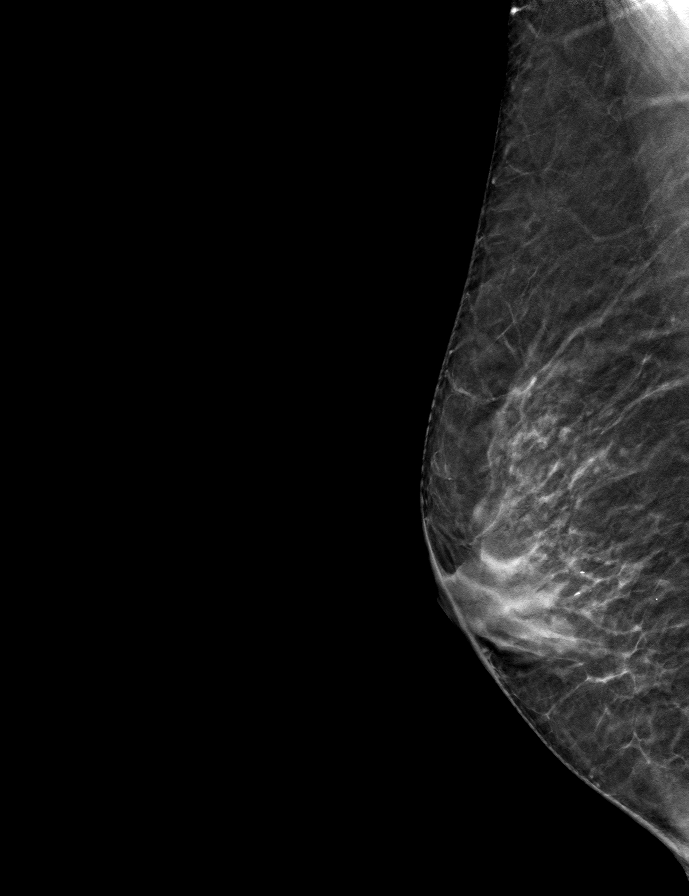

[L MLO tomo · tomo slice 40/79.0]
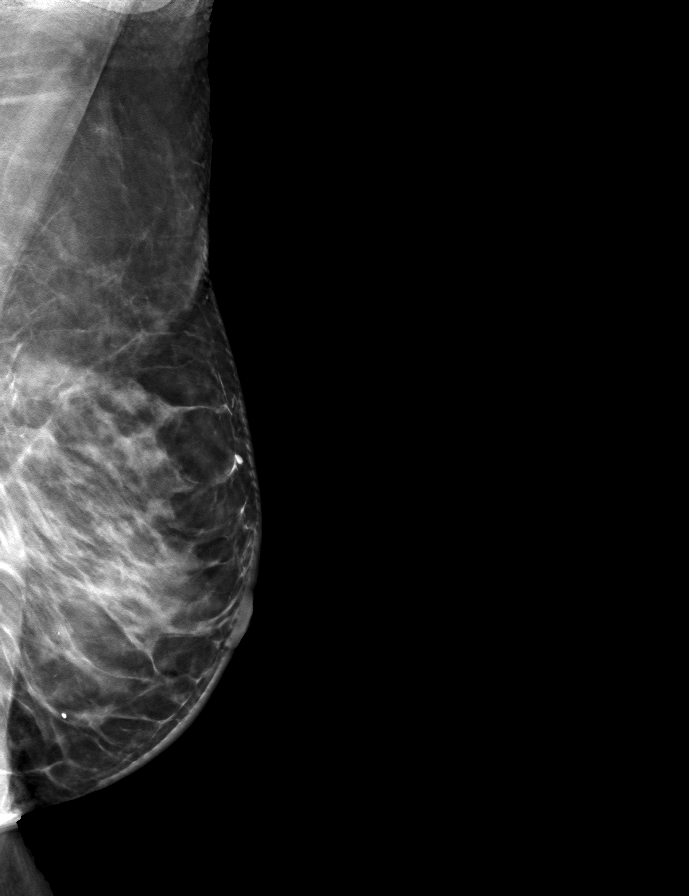

[L CC tomo · tomo slice 35/70.0]
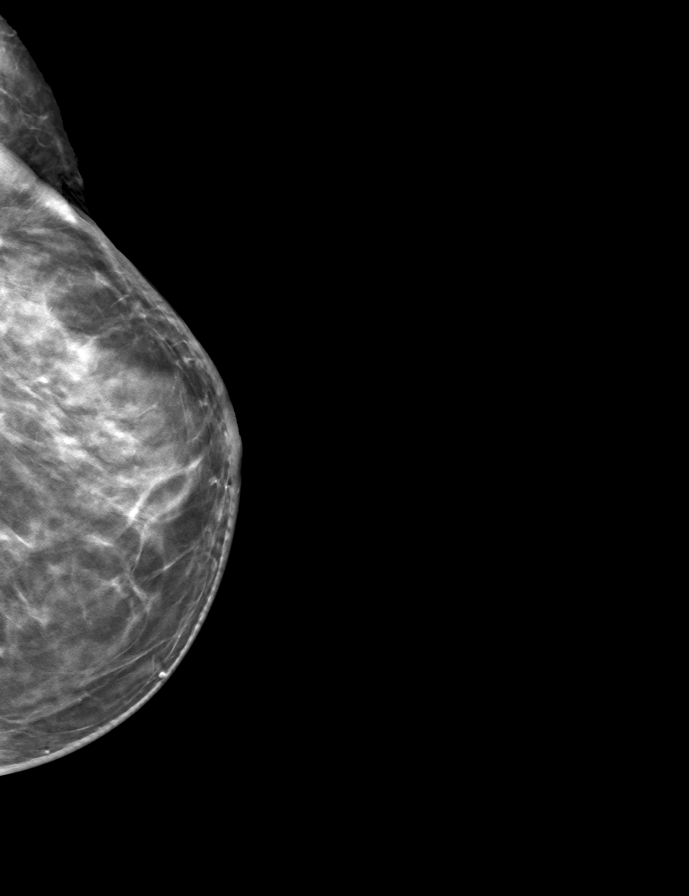

[R CC tomo · tomo slice 27/53.0]
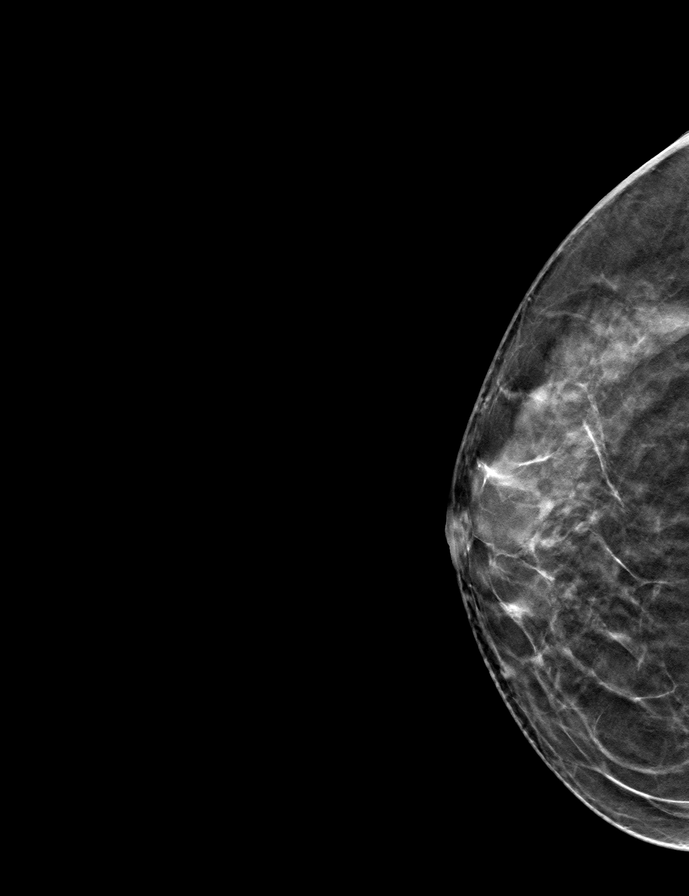

[9 of 25 positions shown; findings below may reference images not displayed]

ACR Breast Density Category c: The breast tissue is heterogeneously
dense, which may obscure small masses.
FINDINGS: No suspicious masses or calcifications are seen in either breast.
Stable lumpectomy changes in the left breast. Spot compression
magnification MLO view of the left breast lumpectomy site was
performed. There is no mammographic evidence of locally recurrent
malignancy.

Mammographic images were processed with CAD.
IMPRESSION: No mammographic evidence of malignancy in either breast.

RECOMMENDATION:
Screening mammogram in one year.(Code:M6-J-RQ1)

I have discussed the findings and recommendations with the patient.
If applicable, a reminder letter will be sent to the patient
regarding the next appointment.

BI-RADS CATEGORY  2: Benign.

## 2023-03-07 ENCOUNTER — Other Ambulatory Visit: Payer: Self-pay | Admitting: Internal Medicine

## 2023-03-07 DIAGNOSIS — Z1231 Encounter for screening mammogram for malignant neoplasm of breast: Secondary | ICD-10-CM

## 2023-06-24 IMAGING — CR DG LUMBAR SPINE 1V
1 series · 1 of 1 positions shown · non-contrast
Comparison: Radiograph 05/16/2020 confirming 5 lumbar type
vertebra.

CLINICAL DATA: Elective surgery.

EXAM:
LUMBAR SPINE - 1 VIEW

[lateral]
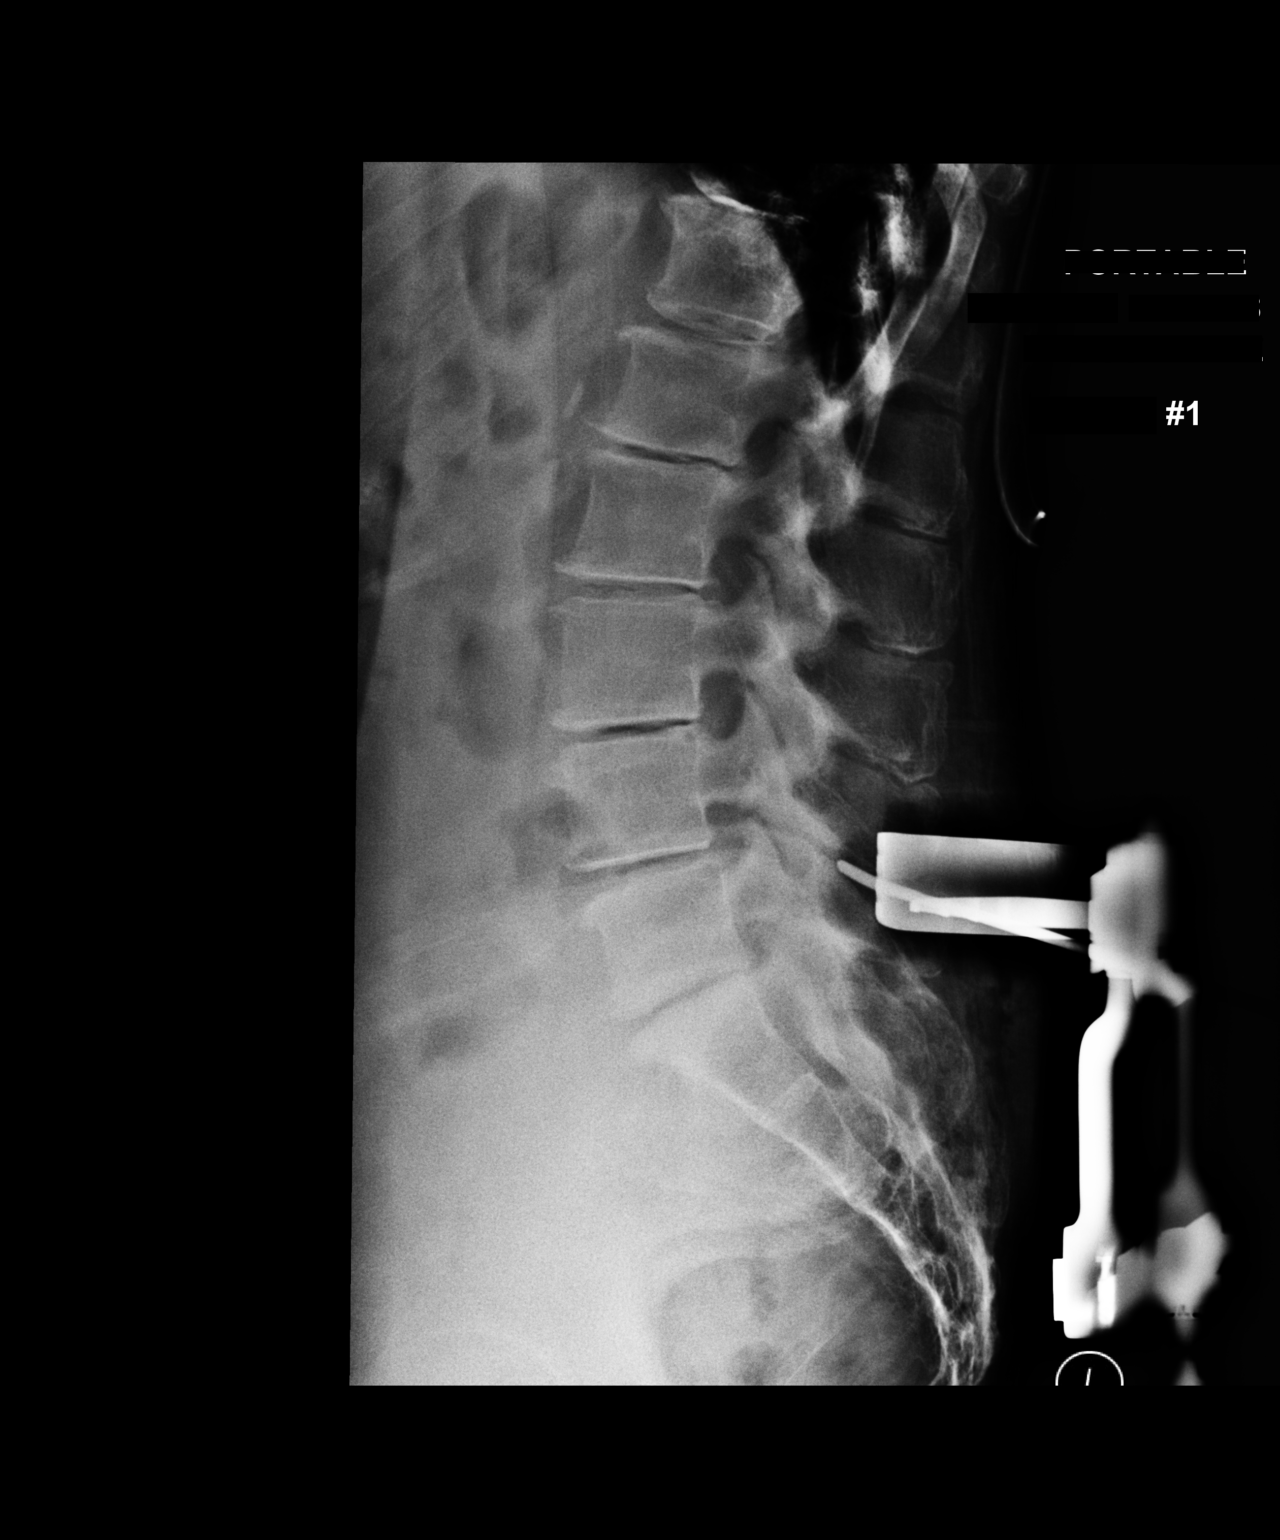

[1 of 1 positions shown; findings below may reference images not displayed]

FINDINGS: Portable cross-table lateral view of the lumbar spine obtained in
the operating room. Surgical instruments localize cyst to the
posterior elements of L4-L5.
IMPRESSION: Surgical instruments localize to the posterior elements of L4-L5.

## 2023-08-10 IMAGING — MG MM DIGITAL SCREENING BILAT W/ TOMO AND CAD
6 of 12 series · 6 of 36 positions shown · non-contrast
Comparison: Previous exam(s).

CLINICAL DATA: Screening.

EXAM:
DIGITAL SCREENING BILATERAL MAMMOGRAM WITH TOMOSYNTHESIS AND CAD
TECHNIQUE: Bilateral screening digital craniocaudal and mediolateral oblique
mammograms were obtained. Bilateral screening digital breast
tomosynthesis was performed. The images were evaluated with
computer-aided detection.

[R CC synth-2D]
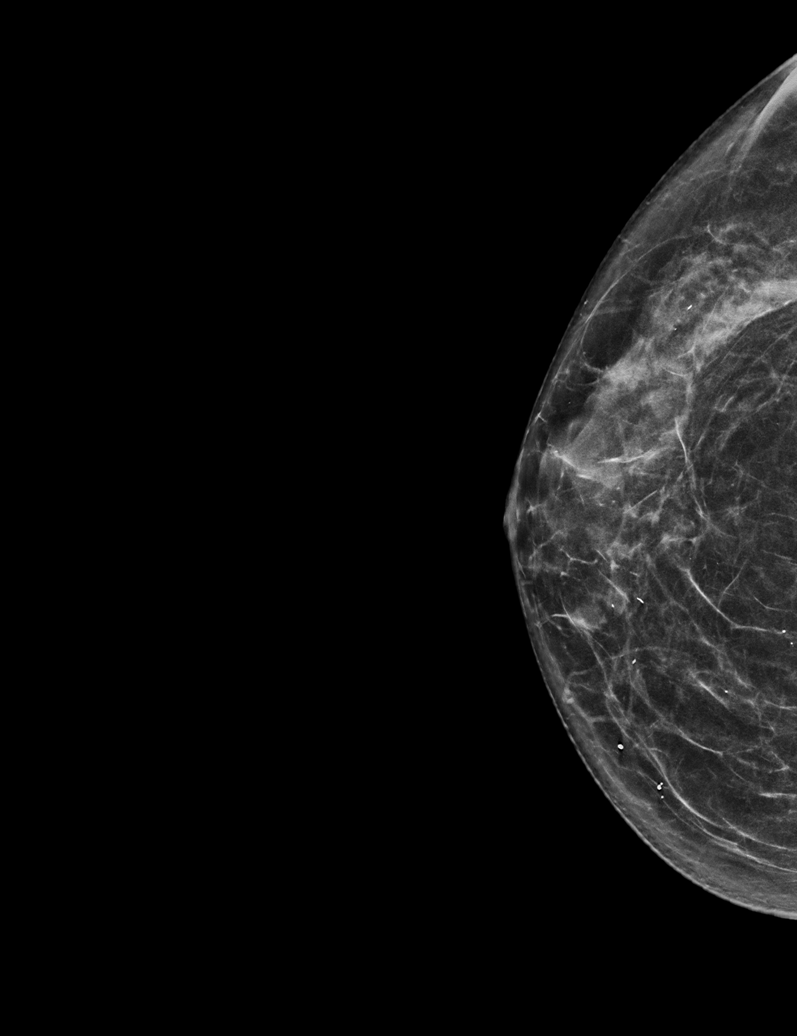

[L MLO synth-2D (1 of 2)]
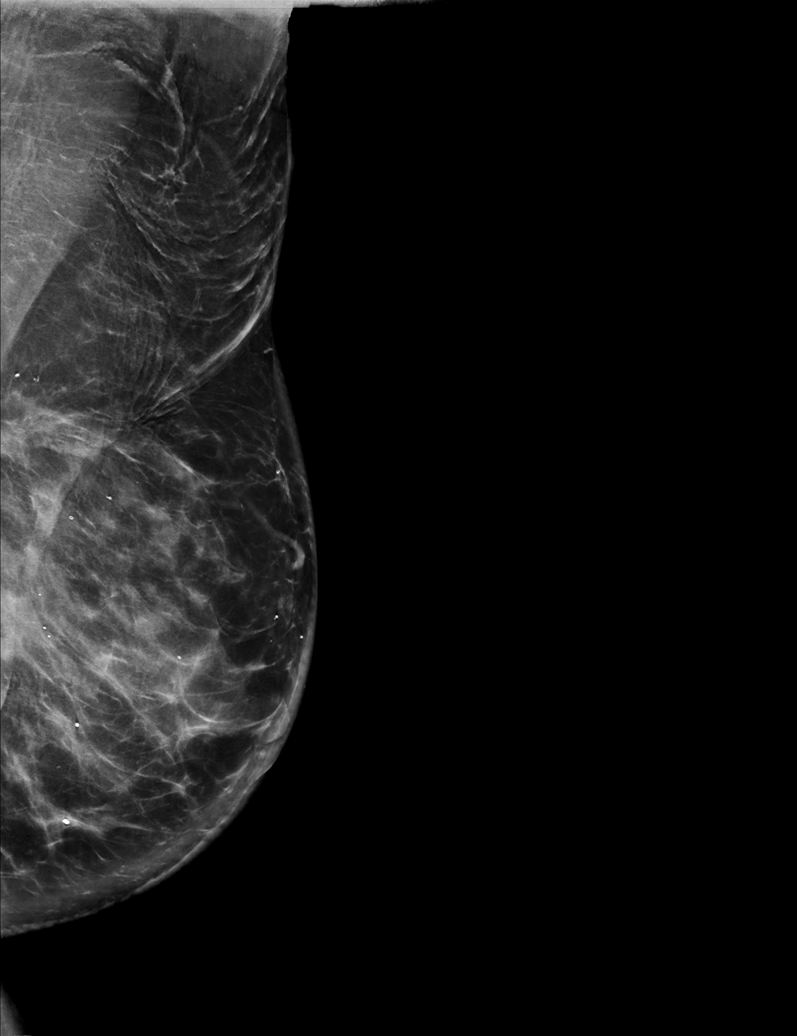

[R MLO synth-2D]
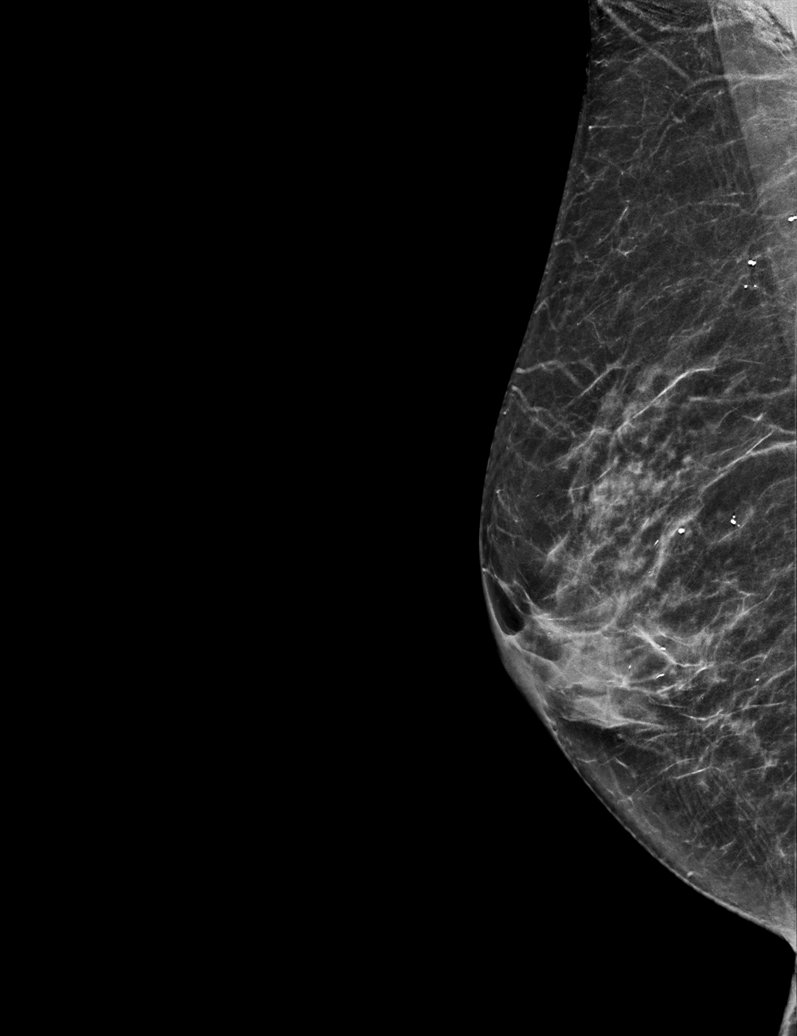

[L CC synth-2D (1 of 2)]
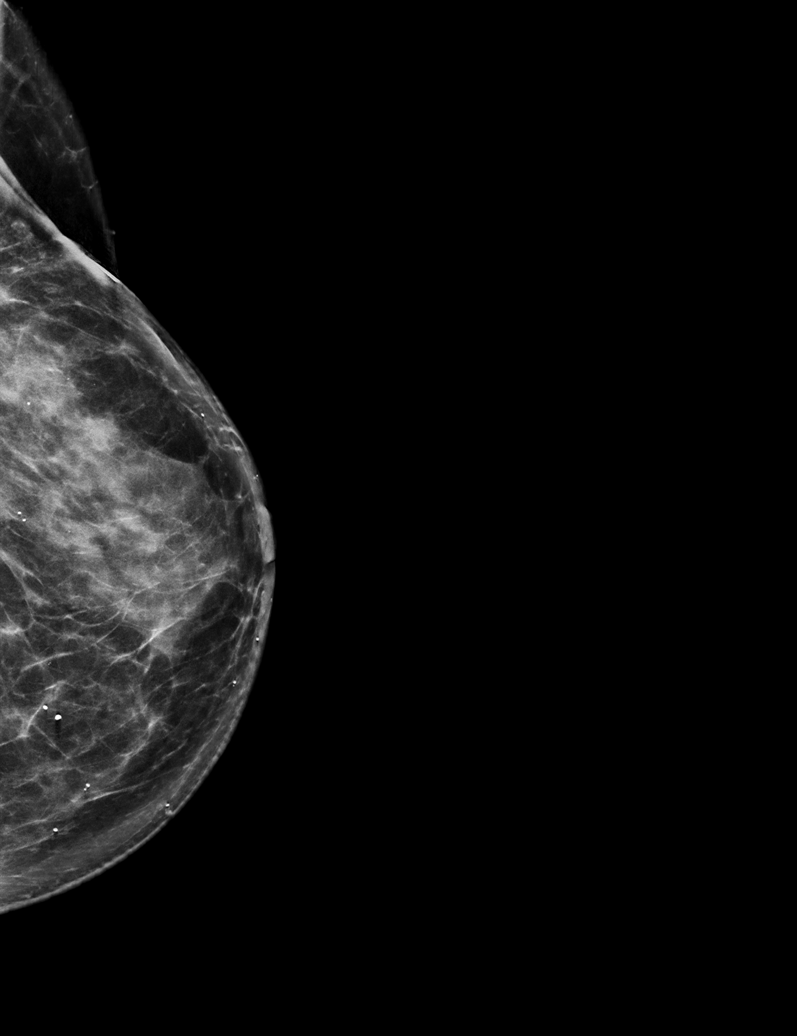

[L MLO synth-2D (2 of 2)]
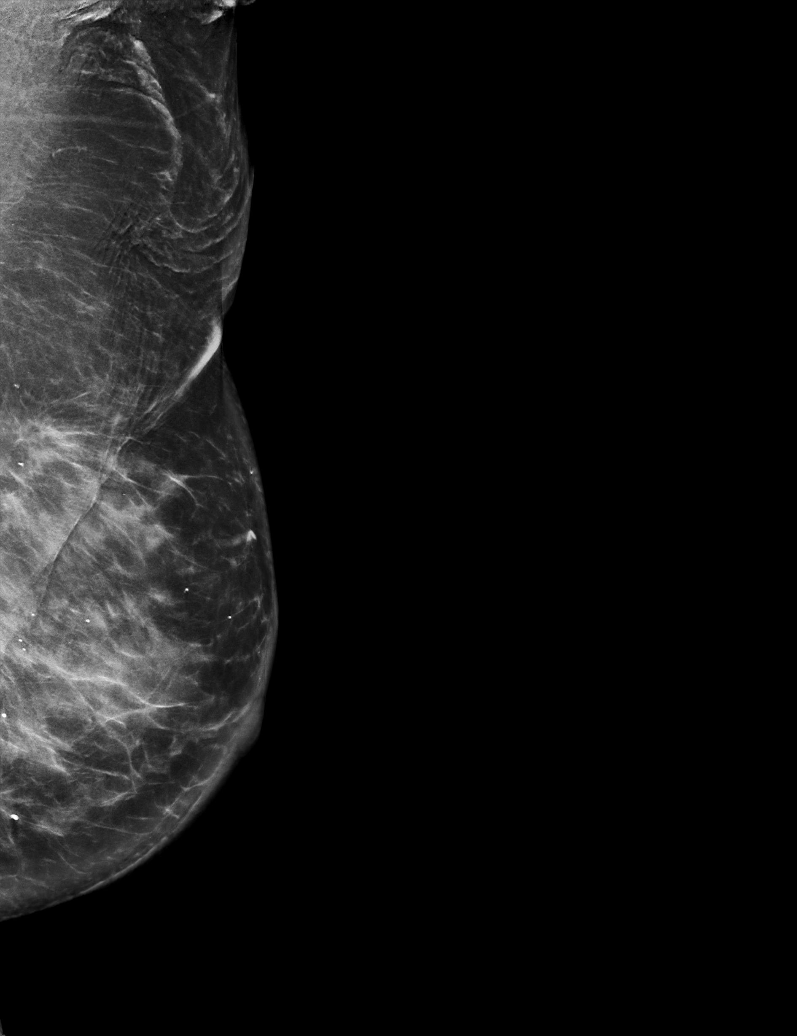

[L CC synth-2D (2 of 2)]
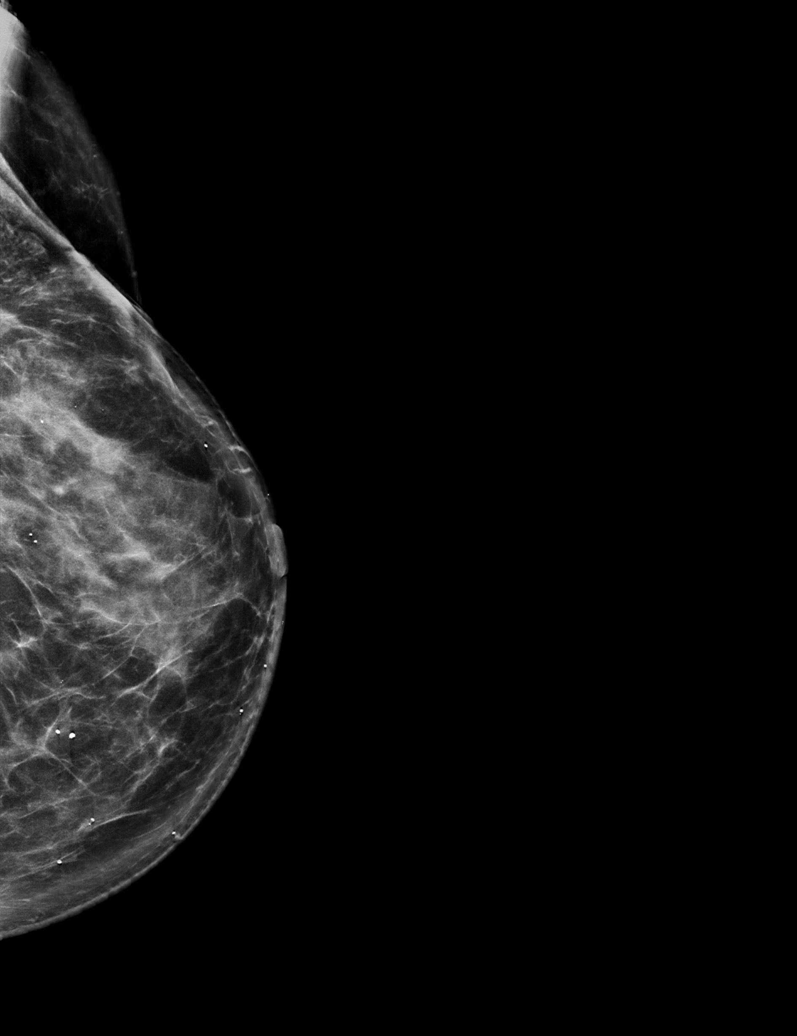

[6 of 36 positions shown; findings below may reference images not displayed]

ACR Breast Density Category c: The breast tissue is heterogeneously
dense, which may obscure small masses.
FINDINGS: There are no findings suspicious for malignancy.
IMPRESSION: No mammographic evidence of malignancy. A result letter of this
screening mammogram will be mailed directly to the patient.

RECOMMENDATION:
Screening mammogram in one year. (Code:Q3-W-BC3)

BI-RADS CATEGORY  1: Negative.

## 2024-03-23 IMAGING — CT CT CERVICAL SPINE W/O CM
3 series · 12 of 33 positions shown, 14 images · non-contrast
Comparison: Cervical spine MRI 06/15/2021

CLINICAL DATA: Cervical spinal stenosis history of ACDF, history of
falls on 06/15/2021



[Series 3: c_spine 2.0 st · axial · 0.27mm/px · z∈[-283,-165]mm · 4 of 87 slices shown, 5 images]
[im 14/87  soft-tissue]
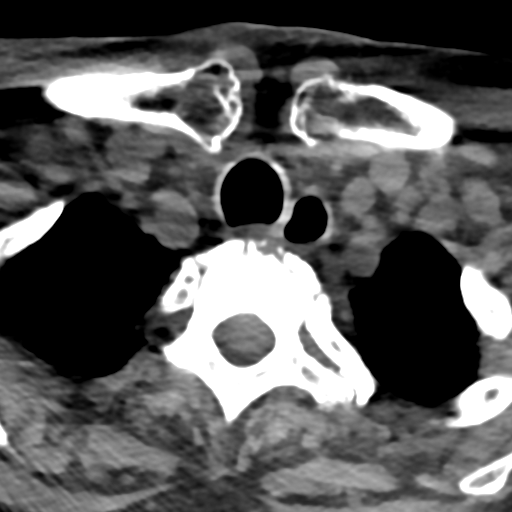
[im 14/87  bone]
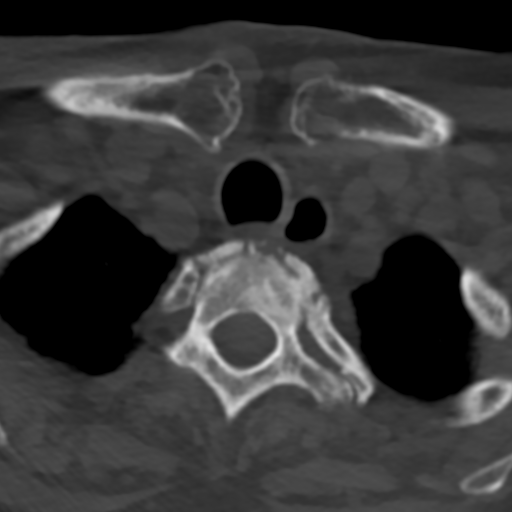
[im 34/87  bone]
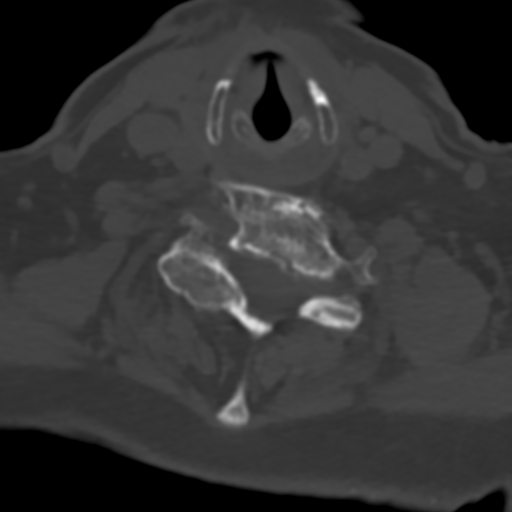
[im 53/87  bone]
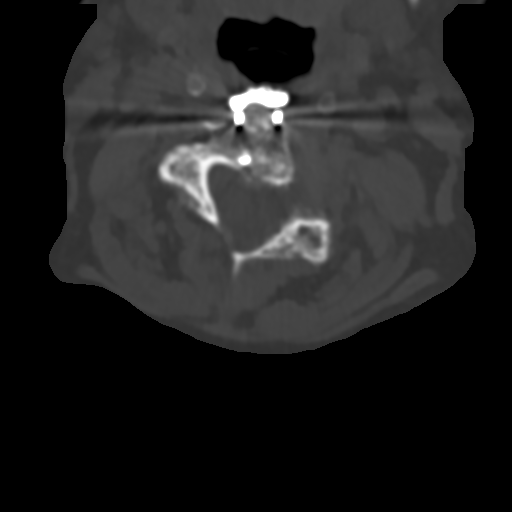
[im 73/87  bone]
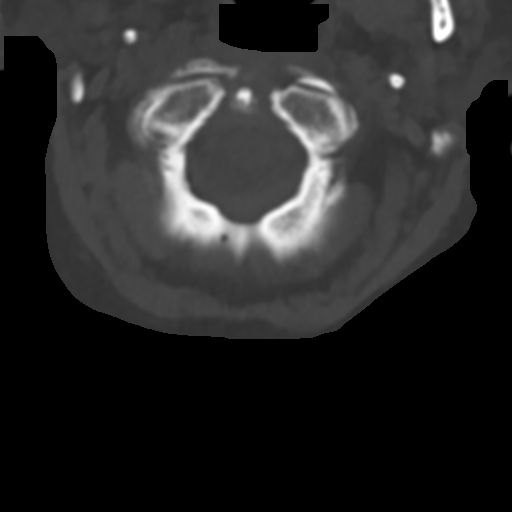

[Series 7: c_spine 2.0 sag bone · sagittal · 0.25mm/px · 5 of 61 slices shown, 6 images]
[im 21/61  bone]
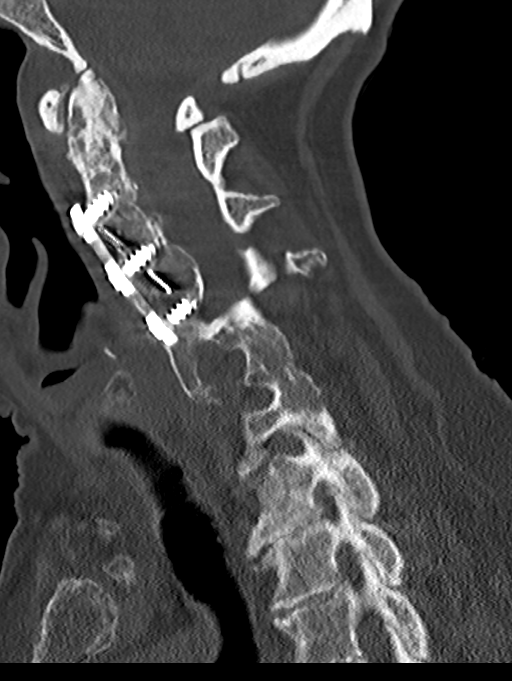
[im 26/61  bone]
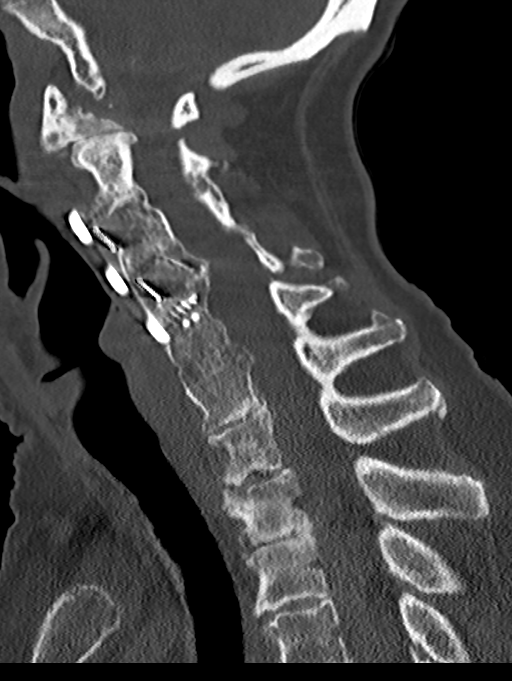
[im 31/61  soft-tissue]
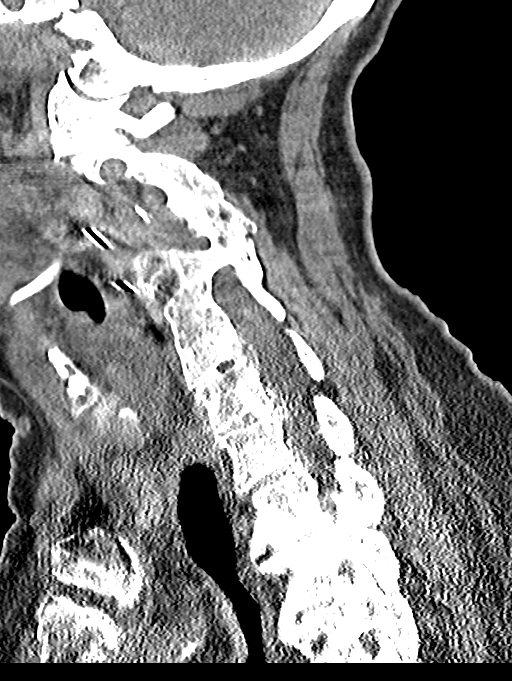
[im 31/61  bone]
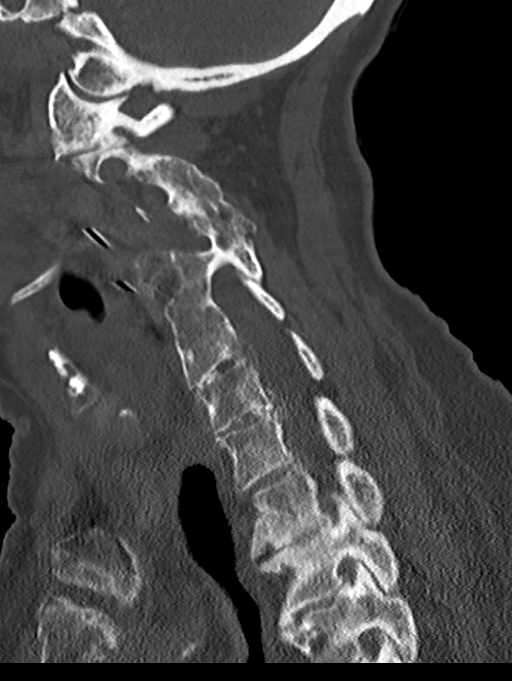
[im 36/61  bone]
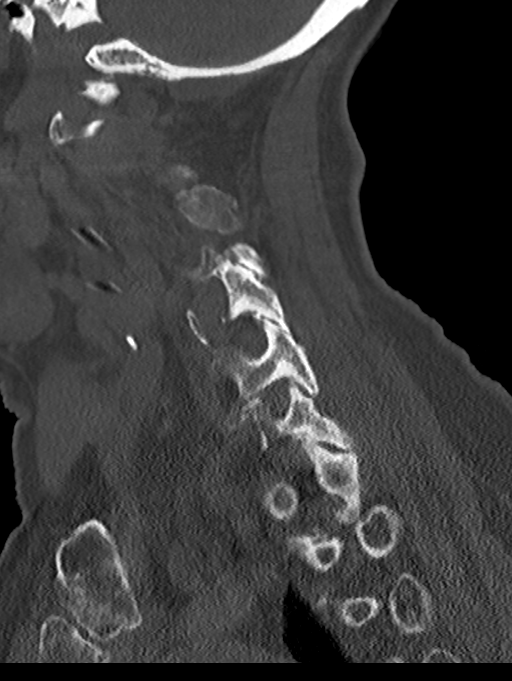
[im 41/61  bone]
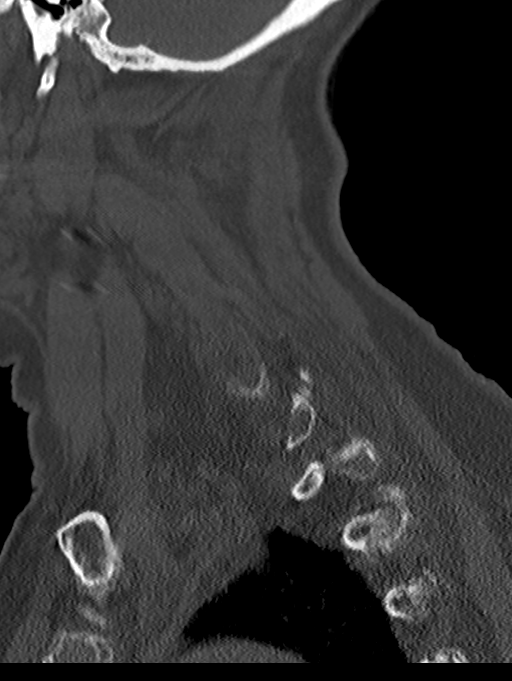

[Series 8: c_spine 2.0 cor bone · coronal · 0.25mm/px · 3 of 62 slices shown]
[im 13/62  bone]
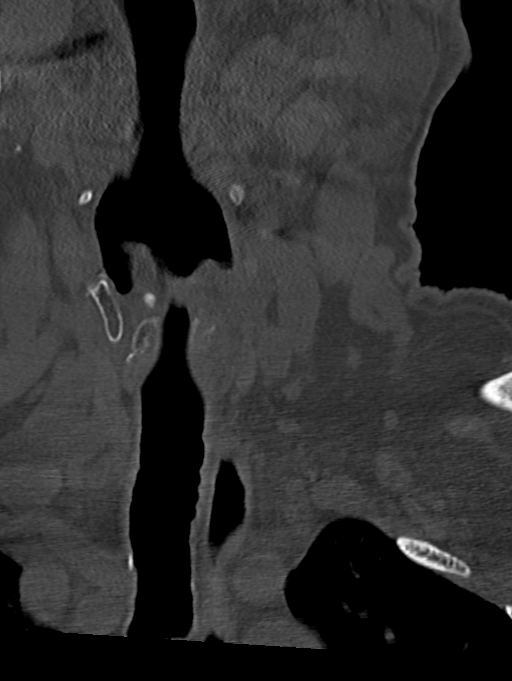
[im 25/62  bone]
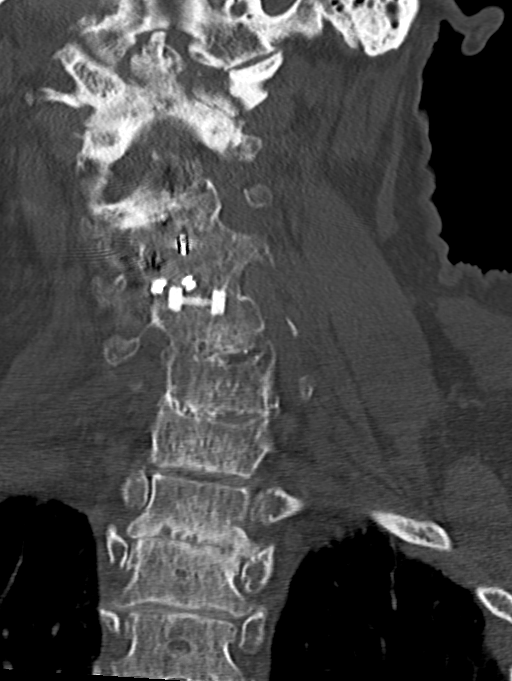
[im 37/62  bone]
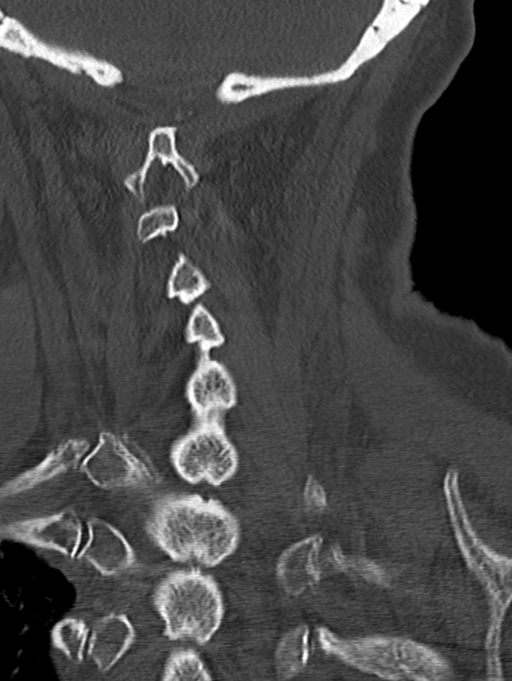

[12 of 33 positions shown; findings below may reference images not displayed]

FINDINGS: Alignment: Slight retrolisthesis of C4 on C5 is unchanged. Reversal
of the normal cervical spine lordosis is unchanged.

Skull base and vertebrae: Skull base alignment is maintained.
Vertebral body heights are preserved. The patient is status post C2
through C4 ACDF with solid fusion. Fusion is also seen across the
C4-C5 and C5-C6 disc spaces without hardware. There is no evidence
of acute injury or suspicious osseous lesion.

Soft tissues and spinal canal: No prevertebral fluid or swelling. No
visible canal hematoma.

Disc levels: There is advanced degenerative change at the C1-C2
articulations. There is degenerative pannus about the dens with
calcifications and cystic change of the dens anteriorly. There is
moderate narrowing of the craniocervical junction.

There is prominent spurring at C3-C4 resulting in mild spinal canal
stenosis, unchanged compared to the prior cervical spine there is
multilevel facet arthropathy throughout the cervical spine, most
advanced on the left at C2-C3 and on the right at C3-C4 and C5-C6.
Multilevel neural foraminal stenosis is described in detail on the
prior MRI report.

Upper chest: The lung apices are clear.

Other: None.
IMPRESSION: 1. Advanced degenerative change at the C1-C2 articulation with
pannus about the dens and associated cystic change at the
atlantodental interval. This finding again may be degenerative in
nature; however, rheumatoid arthritis or CPPD could have a similar
appearance. There is resulting moderate narrowing of the
craniocervical junction.
2. Status post ACDF from C2 through C4 with solid fusion. Solid
fusion is also seen at C4-C5 and C5-C6.
3. Mild spinal canal stenosis at C3-C4 due to prominent endplate
spurring. Additional multilevel degenerative change as above,
described in detail on the report from the MRI from 06/15/2021.

## 2024-04-16 IMAGING — RF DG CERVICAL SPINE 1V
1 series · 1 of 1 positions shown · non-contrast
Comparison: Radiographs 06/08/2021.  CT 07/07/2021.

CLINICAL DATA: C1-3 posterior cervical fusion.

EXAM:
DG CERVICAL SPINE - 1 VIEW

[Series 1: run · 1 of 1 slices shown]
[im 1/1]
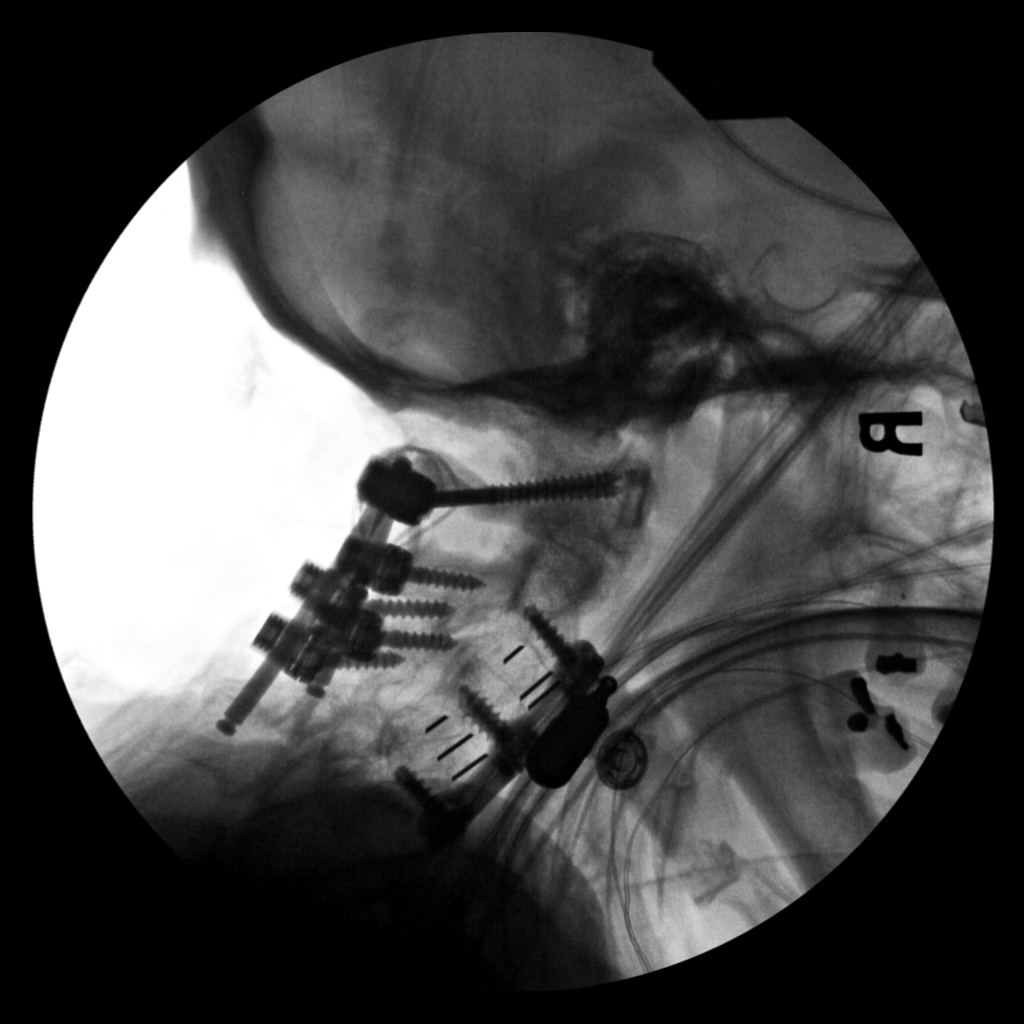

[1 of 1 positions shown; findings below may reference images not displayed]

FINDINGS: C-arm fluoroscopy provided. 47 seconds of fluoroscopy time.
mGy.

A single spot fluoroscopic image of the upper cervical spine is
submitted postoperatively. Previous postsurgical changes from C2-4
ACDF. New posterior pedicle screws and interconnecting rods at C1,
C2 and C3. Bone detail is limited on this single fluoroscopic image.
No complications identified.
IMPRESSION: Intraoperative view following posterior C1-3 fusion.
